# Patient Record
Sex: Female | Born: 1945
Health system: Southern US, Community
[De-identification: ages and names within clinical notes are randomized; demographics above are authoritative.]

## PROBLEM LIST (undated history)

## (undated) DIAGNOSIS — I4891 Unspecified atrial fibrillation: Secondary | ICD-10-CM

## (undated) DIAGNOSIS — I10 Essential (primary) hypertension: Secondary | ICD-10-CM

## (undated) DIAGNOSIS — E0500 Thyrotoxicosis with diffuse goiter without thyrotoxic crisis or storm: Secondary | ICD-10-CM

## (undated) DIAGNOSIS — E0590 Thyrotoxicosis, unspecified without thyrotoxic crisis or storm: Secondary | ICD-10-CM

## (undated) DIAGNOSIS — I509 Heart failure, unspecified: Secondary | ICD-10-CM

## (undated) HISTORY — PX: BREAST SURGERY: SHX581

## (undated) HISTORY — DX: Heart failure, unspecified: I50.9

## (undated) HISTORY — DX: Unspecified atrial fibrillation: I48.91

---

## 1998-11-20 ENCOUNTER — Emergency Department (HOSPITAL_COMMUNITY): Admission: EM | Admit: 1998-11-20 | Discharge: 1998-11-20 | Payer: Self-pay | Admitting: Emergency Medicine

## 1999-01-10 ENCOUNTER — Encounter: Admission: RE | Admit: 1999-01-10 | Discharge: 1999-01-10 | Payer: Self-pay | Admitting: Family Medicine

## 1999-01-10 ENCOUNTER — Encounter: Payer: Self-pay | Admitting: Family Medicine

## 1999-01-20 ENCOUNTER — Other Ambulatory Visit: Admission: RE | Admit: 1999-01-20 | Discharge: 1999-01-20 | Payer: Self-pay | Admitting: Family Medicine

## 1999-04-01 ENCOUNTER — Ambulatory Visit (HOSPITAL_COMMUNITY): Admission: RE | Admit: 1999-04-01 | Discharge: 1999-04-01 | Payer: Self-pay | Admitting: Family Medicine

## 1999-05-05 ENCOUNTER — Ambulatory Visit (HOSPITAL_BASED_OUTPATIENT_CLINIC_OR_DEPARTMENT_OTHER): Admission: RE | Admit: 1999-05-05 | Discharge: 1999-05-05 | Payer: Self-pay | Admitting: Plastic Surgery

## 2000-02-07 ENCOUNTER — Encounter: Admission: RE | Admit: 2000-02-07 | Discharge: 2000-05-07 | Payer: Self-pay | Admitting: Family Medicine

## 2000-11-23 ENCOUNTER — Ambulatory Visit (HOSPITAL_COMMUNITY): Admission: RE | Admit: 2000-11-23 | Discharge: 2000-11-23 | Payer: Self-pay | Admitting: Neurosurgery

## 2000-11-23 ENCOUNTER — Encounter: Payer: Self-pay | Admitting: Neurosurgery

## 2000-12-13 ENCOUNTER — Encounter: Payer: Self-pay | Admitting: Neurosurgery

## 2000-12-17 ENCOUNTER — Inpatient Hospital Stay (HOSPITAL_COMMUNITY): Admission: RE | Admit: 2000-12-17 | Discharge: 2000-12-18 | Payer: Self-pay | Admitting: Neurosurgery

## 2000-12-17 ENCOUNTER — Encounter: Payer: Self-pay | Admitting: Neurosurgery

## 2001-01-10 ENCOUNTER — Other Ambulatory Visit: Admission: RE | Admit: 2001-01-10 | Discharge: 2001-01-10 | Payer: Self-pay | Admitting: Family Medicine

## 2001-11-14 ENCOUNTER — Encounter: Payer: Self-pay | Admitting: Emergency Medicine

## 2001-11-14 ENCOUNTER — Emergency Department (HOSPITAL_COMMUNITY): Admission: EM | Admit: 2001-11-14 | Discharge: 2001-11-14 | Payer: Self-pay | Admitting: Emergency Medicine

## 2003-01-15 ENCOUNTER — Encounter: Admission: RE | Admit: 2003-01-15 | Discharge: 2003-01-15 | Payer: Self-pay | Admitting: Family Medicine

## 2003-08-27 ENCOUNTER — Other Ambulatory Visit: Admission: RE | Admit: 2003-08-27 | Discharge: 2003-08-27 | Payer: Self-pay | Admitting: Family Medicine

## 2003-10-09 ENCOUNTER — Encounter: Admission: RE | Admit: 2003-10-09 | Discharge: 2003-10-09 | Payer: Self-pay | Admitting: Family Medicine

## 2003-12-14 ENCOUNTER — Encounter: Admission: RE | Admit: 2003-12-14 | Discharge: 2003-12-14 | Payer: Self-pay | Admitting: Family Medicine

## 2004-01-08 ENCOUNTER — Ambulatory Visit (HOSPITAL_COMMUNITY): Admission: RE | Admit: 2004-01-08 | Discharge: 2004-01-08 | Payer: Self-pay | Admitting: Gastroenterology

## 2004-01-08 ENCOUNTER — Encounter (INDEPENDENT_AMBULATORY_CARE_PROVIDER_SITE_OTHER): Payer: Self-pay | Admitting: *Deleted

## 2005-08-03 ENCOUNTER — Other Ambulatory Visit: Admission: RE | Admit: 2005-08-03 | Discharge: 2005-08-03 | Payer: Self-pay | Admitting: Family Medicine

## 2007-02-28 ENCOUNTER — Other Ambulatory Visit: Admission: RE | Admit: 2007-02-28 | Discharge: 2007-02-28 | Payer: Self-pay | Admitting: Family Medicine

## 2008-11-27 ENCOUNTER — Emergency Department (HOSPITAL_COMMUNITY): Admission: EM | Admit: 2008-11-27 | Discharge: 2008-11-27 | Payer: Self-pay | Admitting: Emergency Medicine

## 2010-01-12 ENCOUNTER — Other Ambulatory Visit
Admission: RE | Admit: 2010-01-12 | Discharge: 2010-01-12 | Payer: Self-pay | Source: Home / Self Care | Admitting: Family Medicine

## 2010-04-06 LAB — URINALYSIS, ROUTINE W REFLEX MICROSCOPIC
Bilirubin Urine: NEGATIVE
Glucose, UA: NEGATIVE mg/dL
Hgb urine dipstick: NEGATIVE
Ketones, ur: NEGATIVE mg/dL
Nitrite: NEGATIVE
Protein, ur: NEGATIVE mg/dL
Specific Gravity, Urine: 1.019 (ref 1.005–1.030)
Urobilinogen, UA: 0.2 mg/dL (ref 0.0–1.0)
pH: 5.5 (ref 5.0–8.0)

## 2010-04-06 LAB — GLUCOSE, CAPILLARY: Glucose-Capillary: 105 mg/dL — ABNORMAL HIGH (ref 70–99)

## 2010-05-03 ENCOUNTER — Ambulatory Visit
Admission: RE | Admit: 2010-05-03 | Discharge: 2010-05-03 | Disposition: A | Discharge status: Home / Self Care | Payer: BC Managed Care – PPO | Source: Ambulatory Visit | Attending: Family Medicine | Admitting: Family Medicine

## 2010-05-03 ENCOUNTER — Other Ambulatory Visit: Payer: Self-pay | Admitting: Family Medicine

## 2010-05-03 DIAGNOSIS — M1250 Traumatic arthropathy, unspecified site: Secondary | ICD-10-CM

## 2010-05-20 NOTE — Op Note (Signed)
NAME:  Donna Booker, Donna Booker              ACCOUNT NO.:  [id number]   MEDICAL RECORD NO.:  [id number]          PATIENT TYPE:  AMB   LOCATION:  ENDO                         FACILITY:  [facility]   PHYSICIAN:  Anselmo Rod, M.D.  DATE OF BIRTH:  Dec 18, 1945   DATE OF PROCEDURE:  01/08/2004  DATE OF DISCHARGE:                                 OPERATIVE REPORT   PROCEDURE:  Colonoscopy with cold biopsies x6.   ENDOSCOPIST:  Anselmo Rod, M.D.   INSTRUMENT USED:  Olympus video colonoscope.   INDICATIONS FOR PROCEDURE:  A 65 year old African-American female underwent  a screening colonoscopy to rule out colonic polyps, masses, etc.   PREPROCEDURE PREPARATION:  Informed consent was procured from the patient.  The patient fasted for eight hours prior to the procedure and prepped with a  bottle of magnesium citrate and a gallon of GoLYTELY the night prior to the  procedure.   PREPROCEDURE PHYSICAL:  The patient had stable vital signs. Neck supple.  Chest clear to auscultation. S1, S2 regular, no murmurs, rubs or gallops,  rhonchi or wheeze.  Abdomen soft with normal bowel sounds.   DESCRIPTION OF PROCEDURE:  The patient was placed in the left lateral  decubitus position and sedated with 60 mg of Demerol and 7.5 mg of Versed in  slow incremental doses.  Once the patient was adequately sedated and  maintained on low flow oxygen and continuous cardiac monitoring, the Olympus  video colonoscope was advanced from the rectum to the cecum.  Small internal  hemorrhoids were seen on retroflexion and six small sessile polyps were  biopsied from the rectosigmoid colon.  There were no other abnormalities  appreciated. There was no evidence of diverticulosis.  The patient tolerated  the procedure well without complications.   IMPRESSION:  1.  Small nonbleeding internal hemorrhoids.  2.  Six small sessile polyps biopsied from rectosigmoid colon.  3.  Normal appearing proximal left colon, transverse colon,  right colon and      cecum.   RECOMMENDATIONS:  1.  Await pathology results.  2.  Avoid nonsteroidals including aspirins for the next two weeks.  3.  Repeat colonoscopy depending on pathology results.  4.  Outpatient followup on a p.r.n. basis.      Jyot   JNM/MEDQ  D:  01/08/2004  T:  01/08/2004  Job:  [id number]   cc:   Renaye Rakers, M.D.  [phone] [address]  [place]  [place] [address]  Fax: [phone]

## 2011-05-25 ENCOUNTER — Other Ambulatory Visit: Payer: Self-pay | Admitting: Family Medicine

## 2011-05-25 ENCOUNTER — Other Ambulatory Visit (HOSPITAL_COMMUNITY)
Admission: RE | Admit: 2011-05-25 | Discharge: 2011-05-25 | Disposition: A | Discharge status: Home / Self Care | Payer: BC Managed Care – PPO | Source: Ambulatory Visit | Attending: Family Medicine | Admitting: Family Medicine

## 2011-05-25 DIAGNOSIS — Z01419 Encounter for gynecological examination (general) (routine) without abnormal findings: Secondary | ICD-10-CM | POA: Insufficient documentation

## 2011-07-09 ENCOUNTER — Emergency Department (HOSPITAL_COMMUNITY)
Admission: EM | Admit: 2011-07-09 | Discharge: 2011-07-09 | Disposition: A | Discharge status: Home / Self Care | Payer: BC Managed Care – PPO | Attending: Emergency Medicine | Admitting: Emergency Medicine

## 2011-07-09 ENCOUNTER — Encounter (HOSPITAL_COMMUNITY): Payer: Self-pay | Admitting: Emergency Medicine

## 2011-07-09 ENCOUNTER — Emergency Department (HOSPITAL_COMMUNITY): Payer: BC Managed Care – PPO

## 2011-07-09 DIAGNOSIS — Z7982 Long term (current) use of aspirin: Secondary | ICD-10-CM | POA: Insufficient documentation

## 2011-07-09 DIAGNOSIS — Z794 Long term (current) use of insulin: Secondary | ICD-10-CM | POA: Insufficient documentation

## 2011-07-09 DIAGNOSIS — S0003XA Contusion of scalp, initial encounter: Secondary | ICD-10-CM | POA: Insufficient documentation

## 2011-07-09 DIAGNOSIS — Z79899 Other long term (current) drug therapy: Secondary | ICD-10-CM | POA: Insufficient documentation

## 2011-07-09 DIAGNOSIS — I10 Essential (primary) hypertension: Secondary | ICD-10-CM | POA: Insufficient documentation

## 2011-07-09 DIAGNOSIS — E119 Type 2 diabetes mellitus without complications: Secondary | ICD-10-CM | POA: Insufficient documentation

## 2011-07-09 DIAGNOSIS — W19XXXA Unspecified fall, initial encounter: Secondary | ICD-10-CM | POA: Insufficient documentation

## 2011-07-09 HISTORY — DX: Essential (primary) hypertension: I10

## 2011-07-09 NOTE — ED Notes (Signed)
Patient states that she fell in the bathtub yesterday morning; patient denies syncopal episode.  Patient reports that she hit her head on the bathtub.  Patient currently reporting dizziness; reports pain on left side of head.

## 2011-07-09 NOTE — ED Notes (Signed)
Patient discharged with instructions and she verbalized an understanding. Patient is alert and oriented times 4 and complains of soreness in left side of head.

## 2011-07-09 NOTE — ED Notes (Signed)
Patient states she has some dizziness and blurred vision. Denies taking any blood thinners.

## 2011-07-09 NOTE — ED Notes (Signed)
Back from xray

## 2011-07-09 NOTE — ED Provider Notes (Signed)
History     CSN: [id number]  Arrival date & time 07/09/11  4098   First MD Initiated Contact with Patient 07/09/11 0703      Chief Complaint  Patient presents with  . Fall    (Consider location/radiation/quality/duration/timing/severity/associated sxs/prior treatment) Patient is a 66 y.o. female presenting with fall. The history is provided by the patient.  Fall   Patient here after suffering a fall yesterday where she struck the left side of her head and also her left ribs and left lower extremity. No loss of consciousness. Some dizziness since the incident. Denies any neck pain or upper or lower extremity paresthesias. No medications taken prior to arrival. Pain is worse with movement characterized as sharp. Denies any dyspnea. No abdominal pain. Past Medical History  Diagnosis Date  . Diabetes mellitus   . Hypertension     Past Surgical History  Procedure Date  . Breast surgery     History reviewed. No pertinent family history.  History  Substance Use Topics  . Smoking status: Never Smoker   . Smokeless tobacco: Not on file  . Alcohol Use: No    OB History    Grav Para Term Preterm Abortions TAB SAB Ect Mult Living                  Review of Systems  All other systems reviewed and are negative.    Allergies  Review of patient's allergies indicates no known allergies.  Home Medications   Current Outpatient Rx  Name Route Sig Dispense Refill  . ASPIRIN EC 81 MG PO TBEC Oral Take 81 mg by mouth daily.    . INSULIN GLARGINE 100 UNIT/ML [place] SOLN Subcutaneous Inject 14 Units into the skin at bedtime.    Marland Kitchen LIRAGLUTIDE 18 MG/3ML [place] SOLN Subcutaneous Inject 18 mg into the skin daily.    Marland Kitchen METFORMIN HCL 500 MG PO TABS Oral Take 500 mg by mouth 2 (two) times daily with a meal.    . OLMESARTAN-AMLODIPINE-HCTZ 40-10-25 MG PO TABS Oral Take 1 tablet by mouth daily.    Marland Kitchen SPIRONOLACTONE 25 MG PO TABS Oral Take 25 mg by mouth daily.      BP 142/77  Pulse 81  Temp  98.5 F (36.9 C) (Oral)  Resp 18  SpO2 98%  Physical Exam  Nursing note and vitals reviewed. Constitutional: She is oriented to person, place, and time. She appears well-developed and well-nourished.  Non-toxic appearance. No distress.  HENT:  Head: Normocephalic and atraumatic.    Eyes: Conjunctivae, EOM and lids are normal. Pupils are equal, round, and reactive to light.  Neck: Normal range of motion. Neck supple. No tracheal deviation present. No mass present.  Cardiovascular: Normal rate, regular rhythm and normal heart sounds.  Exam reveals no gallop.   No murmur heard. Pulmonary/Chest: Effort normal and breath sounds normal. No stridor. No respiratory distress. She has no decreased breath sounds. She has no wheezes. She has no rhonchi. She has no rales.    Abdominal: Soft. Normal appearance and bowel sounds are normal. She exhibits no distension. There is no tenderness. There is no rigidity, no rebound, no guarding and no CVA tenderness.  Musculoskeletal: Normal range of motion. She exhibits no edema and no tenderness.       Legs: Neurological: She is alert and oriented to person, place, and time. She has normal strength. No cranial nerve deficit or sensory deficit. GCS eye subscore is 4. GCS verbal subscore is 5. GCS motor  subscore is 6.  Skin: Skin is warm and dry. No abrasion and no rash noted.  Psychiatric: She has a normal mood and affect. Her speech is normal and behavior is normal.    ED Course  Procedures (including critical care time)  Labs Reviewed - No data to display No results found.   No diagnosis found.    MDM  X-rays are negative. She's due for discharge        Toy Baker, MD 07/09/11 [phone]

## 2011-07-09 NOTE — ED Notes (Signed)
Patient transported to CT and for an x-ray.

## 2012-05-04 ENCOUNTER — Encounter (HOSPITAL_COMMUNITY): Payer: Self-pay | Admitting: *Deleted

## 2012-05-04 ENCOUNTER — Emergency Department (HOSPITAL_COMMUNITY)
Admission: EM | Admit: 2012-05-04 | Discharge: 2012-05-04 | Disposition: A | Discharge status: Home / Self Care | Payer: BC Managed Care – PPO | Attending: Emergency Medicine | Admitting: Emergency Medicine

## 2012-05-04 ENCOUNTER — Emergency Department (HOSPITAL_COMMUNITY): Payer: BC Managed Care – PPO

## 2012-05-04 DIAGNOSIS — M549 Dorsalgia, unspecified: Secondary | ICD-10-CM

## 2012-05-04 DIAGNOSIS — Z794 Long term (current) use of insulin: Secondary | ICD-10-CM | POA: Insufficient documentation

## 2012-05-04 DIAGNOSIS — M545 Low back pain, unspecified: Secondary | ICD-10-CM | POA: Insufficient documentation

## 2012-05-04 DIAGNOSIS — R319 Hematuria, unspecified: Secondary | ICD-10-CM | POA: Insufficient documentation

## 2012-05-04 DIAGNOSIS — Z79899 Other long term (current) drug therapy: Secondary | ICD-10-CM | POA: Insufficient documentation

## 2012-05-04 DIAGNOSIS — Z7982 Long term (current) use of aspirin: Secondary | ICD-10-CM | POA: Insufficient documentation

## 2012-05-04 DIAGNOSIS — I10 Essential (primary) hypertension: Secondary | ICD-10-CM | POA: Insufficient documentation

## 2012-05-04 DIAGNOSIS — E119 Type 2 diabetes mellitus without complications: Secondary | ICD-10-CM | POA: Insufficient documentation

## 2012-05-04 LAB — CBC WITH DIFFERENTIAL/PLATELET
Basophils Relative: 0 % (ref 0–1)
Eosinophils Absolute: 0.3 10*3/uL (ref 0.0–0.7)
Eosinophils Relative: 3 % (ref 0–5)
HCT: 35 % — ABNORMAL LOW (ref 36.0–46.0)
Hemoglobin: 11.8 g/dL — ABNORMAL LOW (ref 12.0–15.0)
Lymphocytes Relative: 37 % (ref 12–46)
Lymphs Abs: 4 10*3/uL (ref 0.7–4.0)
MCH: 26.2 pg (ref 26.0–34.0)
MCHC: 33.7 g/dL (ref 30.0–36.0)
MCV: 77.6 fL — ABNORMAL LOW (ref 78.0–100.0)
Monocytes Absolute: 0.8 10*3/uL (ref 0.1–1.0)
Monocytes Relative: 7 % (ref 3–12)
Neutro Abs: 5.7 10*3/uL (ref 1.7–7.7)
Neutrophils Relative %: 53 % (ref 43–77)
RBC: 4.51 MIL/uL (ref 3.87–5.11)
RDW: 13.3 % (ref 11.5–15.5)
WBC: 10.8 10*3/uL — ABNORMAL HIGH (ref 4.0–10.5)

## 2012-05-04 LAB — BASIC METABOLIC PANEL
BUN: 24 mg/dL — ABNORMAL HIGH (ref 6–23)
Calcium: 9.8 mg/dL (ref 8.4–10.5)
Chloride: 98 mEq/L (ref 96–112)
Creatinine, Ser: 1.04 mg/dL (ref 0.50–1.10)
GFR calc Af Amer: 64 mL/min — ABNORMAL LOW (ref >90)
GFR calc non Af Amer: 56 mL/min — ABNORMAL LOW (ref >90)
Glucose, Bld: 242 mg/dL — ABNORMAL HIGH (ref 70–99)
Potassium: 4.2 mEq/L (ref 3.5–5.1)
Sodium: 137 mEq/L (ref 135–145)

## 2012-05-04 LAB — URINALYSIS, ROUTINE W REFLEX MICROSCOPIC
Bilirubin Urine: NEGATIVE
Glucose, UA: 100 mg/dL — AB
Ketones, ur: NEGATIVE mg/dL
Leukocytes, UA: NEGATIVE
Nitrite: NEGATIVE
Urobilinogen, UA: 0.2 mg/dL (ref 0.0–1.0)
pH: 6 (ref 5.0–8.0)

## 2012-05-04 LAB — URINE MICROSCOPIC-ADD ON

## 2012-05-04 MED ORDER — ONDANSETRON 4 MG PO TBDP
4.0000 mg | ORAL_TABLET | Freq: Once | ORAL | Status: AC
  Administered 2012-05-04: 4 mg via ORAL
  Filled 2012-05-04: qty 1

## 2012-05-04 MED ORDER — DIAZEPAM 5 MG PO TABS: 5.0000 mg | ORAL_TABLET | Freq: Four times a day (QID) | ORAL | Status: DC | PRN

## 2012-05-04 MED ORDER — MORPHINE SULFATE 4 MG/ML IJ SOLN
4.0000 mg | Freq: Once | INTRAMUSCULAR | Status: AC
  Administered 2012-05-04: 4 mg via INTRAMUSCULAR
  Filled 2012-05-04: qty 1

## 2012-05-04 NOTE — ED Notes (Signed)
Patient transported to CT 

## 2012-05-04 NOTE — ED Provider Notes (Signed)
History     CSN: [id number]  Arrival date & time 05/04/12  1027   First MD Initiated Contact with Patient 05/04/12 [phone]      Chief Complaint  Patient presents with  . Back Pain    (Consider location/radiation/quality/duration/timing/severity/associated sxs/prior treatment) HPI  Donna Booker is a 67 y.o. female with insulin-dependent diabetes and hypertension complaining of  worsening lower back pain over the last 4 days patient denies any unusual activity or strenuous activity, patient states that the low back pain is bilateral and radiates around to the groin on both sides it also radiates down the leg the left leg is worse than the right with a pins and needles paresthesia. Does not have history of lumbago. She states that standing makes a better but walking makes it worse. Patient took Aleve 2 days ago and it was helpful however she was informed by her doctor that she should not take Aleve so she hasn't taken anything recently. She denies fever, N/V, change in bowel or bladder habits, history of cancer, history of IV drug use.   Past Medical History  Diagnosis Date  . Diabetes mellitus   . Hypertension     Past Surgical History  Procedure Laterality Date  . Breast surgery      No family history on file.  History  Substance Use Topics  . Smoking status: Never Smoker   . Smokeless tobacco: Not on file  . Alcohol Use: No    OB History   Grav Para Term Preterm Abortions TAB SAB Ect Mult Living                  Review of Systems  Constitutional: Negative for fever.  HENT: Negative for neck pain.   Respiratory: Negative for shortness of breath.   Cardiovascular: Negative for chest pain.  Gastrointestinal: Negative for nausea, vomiting, abdominal pain and diarrhea.  Genitourinary: Negative for dysuria and difficulty urinating.  Musculoskeletal: Positive for back pain.  Neurological: Negative for weakness and numbness.  All other systems reviewed and are  negative.    Allergies  Review of patient's allergies indicates no known allergies.  Home Medications   Current Outpatient Rx  Name  Route  Sig  Dispense  Refill  . aspirin EC 81 MG tablet   Oral   Take 81 mg by mouth daily.         . insulin glargine (LANTUS SOLOSTAR) 100 UNIT/ML injection   Subcutaneous   Inject 14 Units into the skin at bedtime.         . Liraglutide (VICTOZA) 18 MG/3ML SOLN   Subcutaneous   Inject 18 mg into the skin daily.         . metFORMIN (GLUCOPHAGE) 500 MG tablet   Oral   Take 500 mg by mouth 2 (two) times daily with a meal.         . Olmesartan-Amlodipine-HCTZ (TRIBENZOR) 40-10-25 MG TABS   Oral   Take 1 tablet by mouth daily.         Marland Kitchen spironolactone (ALDACTONE) 25 MG tablet   Oral   Take 25 mg by mouth daily.           BP 125/78  Pulse 81  Temp(Src) 98.1 F (36.7 C) (Oral)  Resp 18  SpO2 100%  Physical Exam  Nursing note and vitals reviewed. Constitutional: She is oriented to person, place, and time. She appears well-developed and well-nourished. No distress.  HENT:  Head: Normocephalic.  Eyes: Conjunctivae  and EOM are normal. Pupils are equal, round, and reactive to light.  Neck: Normal range of motion. No JVD present.  Cardiovascular: Normal rate, regular rhythm and intact distal pulses.   Pulmonary/Chest: Effort normal and breath sounds normal. No stridor. No respiratory distress. She has no wheezes. She has no rales. She exhibits no tenderness.     Abdominal: Soft. Bowel sounds are normal. She exhibits no distension and no mass. There is no tenderness. There is no rebound and no guarding.  No TTP, no masses  Genitourinary:  No CVAT b/l  Musculoskeletal: Normal range of motion.  Strait leg raise positive bilaterally  No midline tenderness to palpation or step-offs of the lumbar region, no paraspinal muscular spasm and tenderness to palpation.  Neurological: She is alert and oriented to person, place, and  time.  Follows commands, Goal oriented speech, Strength is 5 out of 5x4 extremities, patient ambulates with a coordinated in nonantalgic gait. Sensation is grossly intact.   Psychiatric: She has a normal mood and affect.    ED Course  Procedures (including critical care time)  Labs Reviewed  URINALYSIS, ROUTINE W REFLEX MICROSCOPIC - Abnormal; Notable for the following:    Glucose, UA 100 (*)    Hgb urine dipstick MODERATE (*)    Protein, ur 100 (*)    All other components within normal limits  CBC WITH DIFFERENTIAL - Abnormal; Notable for the following:    WBC 10.8 (*)    Hemoglobin 11.8 (*)    HCT 35.0 (*)    MCV 77.6 (*)    All other components within normal limits  URINE MICROSCOPIC-ADD ON - Abnormal; Notable for the following:    Squamous Epithelial / LPF FEW (*)    All other components within normal limits  BASIC METABOLIC PANEL   Ct Abdomen Pelvis Wo Contrast  05/04/2012  *RADIOLOGY REPORT*  Clinical Data: Back pain, mid abdominal pain  CT ABDOMEN AND PELVIS WITHOUT CONTRAST  Technique:  Multidetector CT imaging of the abdomen and pelvis was performed following the standard protocol without intravenous contrast.  Comparison: None.  Findings: Sagittal images of the spine shows degenerative changes lumbar spine.  There is disc space flattening with vacuum disc phenomenon and mild anterior spurring at L1-L2 and L2-L3 level. Multilevel mild anterior spurring.  Lung bases are unremarkable.  Study is limited without IV contrast. Moderate colonic stool.  No calcified gallstones are noted within gallbladder.  The unenhanced liver shows no biliary ductal dilatation.  Unenhanced pancreas spleen is unremarkable. Nonspecific mild thickening of the adrenal glands without definite mass on this unenhanced scan.  Unenhanced kidneys shows no nephrolithiasis.  No hydronephrosis or hydroureter.  No calcified ureteral calculi are noted.  There is a low density lesion in the upper pole of the left kidney  posterior aspect measures 1.4 cm.  This may represent a cyst but cannot be characterized without IV contrast.  There might be a second cyst in the mid pole of the left kidney measures 1.1 cm.  No aortic aneurysm.  No small bowel obstruction.  No ascites or free air.  No adenopathy.  There is no pericecal inflammation. Normal appendix is partially visualized axial image 56.  Unenhanced uterus and adnexa is unremarkable.  The urinary bladder is unremarkable.  Bilateral distal ureter is unremarkable.  No destructive bony lesions are noted within pelvis.  IMPRESSION:  1.  No nephrolithiasis.  No hydronephrosis or hydroureter. 2.  No calcified ureteral calculi. 3.  Question left renal cysts. 4.  No pericecal inflammation.  Normal appendix partially visualized. 5.  Moderate colonic stool. 6.  Degenerative changes lumbar spine.   Original Report Authenticated By: Natasha Mead, M.D.    Dg Lumbar Spine Complete  05/04/2012  *RADIOLOGY REPORT*  Clinical Data: Low back pain  LUMBAR SPINE - COMPLETE 4+ VIEW  Comparison: 11/27/2008  Findings: No evidence for fracture.  No substantial subluxation. Loss of disc height is seen at L1-L2 and L2-L3.  Trace anterolisthesis of L4-5 noted.  There is lower lumbar facet degeneration bilaterally.  SI joints are unremarkable.  IMPRESSION: Stable exam.  No acute bony findings.   Original Report Authenticated By: Kennith Center, M.D.      1. Back pain   2. Hematuria             MDM   Donna Booker is a 67 y.o. female with bilateral lower back pain that radiates around to the groin and down the legs. Patient has no prior history of back pain. No urinary symptoms. Bloodwork, UA and plain film.  Blood work shows mild leukocytosis of 10.8. Otherwise unremarkable. Urinalysis is not consistent with infection however there is a moderate amount of urine. For this reason CT scan is ordered.  CT shows no acute abnormalities.  I will ask the patient to follow with her primary care  provider for followup of the hematuria.   Discussed case with attending who agrees with plan and stability to d/c to home.    Filed Vitals:   05/04/12 0651 05/04/12 0700 05/04/12 0800  BP: 125/78 139/67 119/100  Pulse: 81 74 73  Temp: 98.1 F (36.7 C)    TempSrc: Oral    Resp: 18    SpO2: 100% 100% 98%     VSS and patient is appropriate for and amenable to discharge at this time. Pt verbalized understanding and agrees with care plan. Outpatient follow-up and return precautions given.     New Prescriptions   DIAZEPAM (VALIUM) 5 MG TABLET    Take 1 tablet (5 mg total) by mouth every 6 (six) hours as needed (muscle spasms).          Wynetta Emery, PA-C 05/04/12 1043

## 2012-05-04 NOTE — ED Notes (Signed)
Worsening lower back pain, radiating to mid abd. Makes it difficult to stand.

## 2012-05-04 NOTE — ED Notes (Signed)
Patient transported to X-ray 

## 2012-05-05 NOTE — ED Provider Notes (Signed)
Medical screening examination/treatment/procedure(s) were performed by non-physician practitioner and as supervising physician I was immediately available for consultation/collaboration.    Chayson Charters R Kyleah Pensabene, MD 05/05/12 0735 

## 2012-10-04 ENCOUNTER — Ambulatory Visit: Payer: BC Managed Care – PPO

## 2012-10-04 ENCOUNTER — Ambulatory Visit (INDEPENDENT_AMBULATORY_CARE_PROVIDER_SITE_OTHER): Payer: BC Managed Care – PPO | Admitting: Family Medicine

## 2012-10-04 VITALS — BP 140/70 | HR 85 | Temp 97.8°F | Resp 16 | Ht 64.5 in | Wt 237.0 lb

## 2012-10-04 DIAGNOSIS — R079 Chest pain, unspecified: Secondary | ICD-10-CM

## 2012-10-04 DIAGNOSIS — R0781 Pleurodynia: Secondary | ICD-10-CM

## 2012-10-04 DIAGNOSIS — E119 Type 2 diabetes mellitus without complications: Secondary | ICD-10-CM

## 2012-10-04 DIAGNOSIS — R0789 Other chest pain: Secondary | ICD-10-CM

## 2012-10-04 DIAGNOSIS — I10 Essential (primary) hypertension: Secondary | ICD-10-CM | POA: Insufficient documentation

## 2012-10-04 MED ORDER — TRAMADOL HCL 50 MG PO TABS: 50.0000 mg | ORAL_TABLET | Freq: Three times a day (TID) | ORAL | Status: DC | PRN

## 2012-10-04 NOTE — Progress Notes (Signed)
[facility] [address], [place] [address] [phone]  Date:  10/04/2012   Name:  Donna Booker   DOB:  1945-01-21   MRN:  [id number]  PCP:  Geraldo Pitter, MD    Chief Complaint: Back Pain   History of Present Illness:  Donna Booker is a 67 y.o. very pleasant female patient who presents with the following:  History of DM and HTN.  She has a PCP elsewhere.   She has noted pain in her left trunk for about 10 days.   She was in a class last week and had to sit in an uncomfortable chair for several hours- this seemed to precipitate her sx.   She has pain when she moves quickly, or when she changes position.  She has not noted any cough or sneeze, so she is not sure if these maneuvers would worsen her pain.    No cough, no fever.  She saw her PCP on Monday and was given some medication for muscle spasm- ibuprofen/ famotidine samples and flexeril.  However these do not seem to be helping much  She did not have x-rays.    No nuasea or vomiting.   She has not noted any SOB.  No chest pain.    There are no active problems to display for this patient.   Past Medical History  Diagnosis Date  . Diabetes mellitus   . Hypertension     Past Surgical History  Procedure Laterality Date  . Breast surgery      History  Substance Use Topics  . Smoking status: Never Smoker   . Smokeless tobacco: Not on file  . Alcohol Use: No    No family history on file.  No Known Allergies  Medication list has been reviewed and updated.  Current Outpatient Prescriptions on File Prior to Visit  Medication Sig Dispense Refill  . aspirin EC 81 MG tablet Take 81 mg by mouth daily.      Marland Kitchen esomeprazole (NEXIUM) 40 MG capsule Take 40 mg by mouth daily before breakfast.      . insulin glargine (LANTUS SOLOSTAR) 100 UNIT/ML injection Inject 14 Units into the skin at bedtime.      . Liraglutide (VICTOZA) 18 MG/3ML SOLN Inject 18 mg into the skin daily.      .  metFORMIN (GLUCOPHAGE) 500 MG tablet Take 500 mg by mouth 2 (two) times daily with a meal.      . spironolactone (ALDACTONE) 25 MG tablet Take 25 mg by mouth daily.      . diazepam (VALIUM) 5 MG tablet Take 1 tablet (5 mg total) by mouth every 6 (six) hours as needed (muscle spasms).  15 tablet  0  . Olmesartan-Amlodipine-HCTZ (TRIBENZOR) 40-10-25 MG TABS Take 1 tablet by mouth daily.       No current facility-administered medications on file prior to visit.    Review of Systems:  As per HPI- otherwise negative.   Physical Examination: Filed Vitals:   10/04/12 1723  BP: 140/70  Pulse: 85  Temp: 97.8 F (36.6 C)  Resp: 16   Filed Vitals:   10/04/12 1723  Height: 5' 4.5" (1.638 m)  Weight: 237 lb (107.502 kg)   Body mass index is 40.07 kg/(m^2). Ideal Body Weight: Weight in (lb) to have BMI = 25: 147.6  GEN: WDWN, NAD, Non-toxic, A & O x 3, obese HEENT: Atraumatic, Normocephalic. Neck supple. No masses, No LAD.  Bilateral TM wnl,  oropharynx normal.  PEERL,EOMI.   Ears and Nose: No external deformity. CV: RRR, No M/G/R. No JVD. No thrill. No extra heart sounds. PULM: CTA B, no wheezes, crackles, rhonchi. No retractions. No resp. distress. No accessory muscle use. ABD: S, NT, ND. No rebound. No HSM. EXTR: No c/c/e NEURO Normal gait.  PSYCH: Normally interactive. Conversant. Not depressed or anxious appearing.  Calm demeanor.  Left trunk: I am able to reproduce tenderness by pressing on her left lateral ribs.  No lesions or rash.   Left shoulder and neck negative  [facility] reading (PRIMARY) by  Dr. Patsy Lager. Chest/ ribs: negative  EXAM: LEFT RIBS AND CHEST - 3+ VIEW  COMPARISON: 07/09/2011  FINDINGS: The lungs are clear. No edema, infiltrate, pneumothorax or pleural fluid is identified. The heart size is within normal limits. Oblique left rib films show no evidence of rib fracture or lesion.  IMPRESSION: Normal chest and left ribs.  EKG:  SR, no ST elelvation or  depression  Assessment and Plan: Rib pain on left side - Plan: DG Ribs Unilateral W/Chest Left, EKG 12-Lead, traMADol (ULTRAM) 50 MG tablet  Pain in left ribs.  Will d/c flexeril as not helpful.  Tramadol prn pain.  She will let me know if not better in the next few days.    Signed Abbe Amsterdam, MD

## 2012-10-04 NOTE — Patient Instructions (Addendum)
Use the tramadol as needed for pain. Let me know if you are not feeling better in the next few days- Sooner if worse.   Do not combine the tramadol and flexeril (cyclobenzabrine) as this could make you very sleepy.

## 2013-11-10 ENCOUNTER — Ambulatory Visit (INDEPENDENT_AMBULATORY_CARE_PROVIDER_SITE_OTHER): Payer: BC Managed Care – PPO | Admitting: Podiatry

## 2013-11-10 ENCOUNTER — Ambulatory Visit (INDEPENDENT_AMBULATORY_CARE_PROVIDER_SITE_OTHER): Payer: Medicare Other

## 2013-11-10 ENCOUNTER — Encounter: Payer: Self-pay | Admitting: Podiatry

## 2013-11-10 VITALS — BP 131/65 | HR 79 | Resp 16

## 2013-11-10 DIAGNOSIS — M722 Plantar fascial fibromatosis: Secondary | ICD-10-CM

## 2013-11-10 MED ORDER — TRIAMCINOLONE ACETONIDE 10 MG/ML IJ SUSP
10.0000 mg | Freq: Once | INTRAMUSCULAR | Status: AC
  Administered 2013-11-10: 10 mg

## 2013-11-10 NOTE — Patient Instructions (Signed)

## 2013-11-10 NOTE — Progress Notes (Signed)
   Subjective:    Patient ID: Donna Booker, female    DOB: [birth year], 68 y.o.   MRN: [id number]  HPI Comments: "I have pain in this heel"  Patient c/o sharp pain plantar heel right for about 1 week. AM pain and some days are constant. She has been taking Aleve and its helped.  Also, she has a blistered area lateral left heel that itches and burns.     Review of Systems  Allergic/Immunologic: Positive for environmental allergies.  All other systems reviewed and are negative.      Objective:   Physical Exam        Assessment & Plan:

## 2013-11-11 NOTE — Progress Notes (Signed)
Subjective:     Patient ID: Donna Booker, female   DOB: 05/14/1945, 68 y.o.   MRN: [id number]  HPIpatient has significant discomfort in the right plantar heel at the insertion of the tendon into the calcaneus that's been present for about one week   Review of Systems  All other systems reviewed and are negative.      Objective:   Physical Exam  Constitutional: She is oriented to person, place, and time.  Cardiovascular: Intact distal pulses.   Musculoskeletal: Normal range of motion.  Neurological: She is oriented to person, place, and time.  Skin: Skin is warm.  Nursing note and vitals reviewed.  neurovascular status intact with muscle strength adequate and range of motion subtalar and midtarsal joint within normal limits. Patient is noted to have exquisite discomfort right plantar heel at the insertion the tendon the calcaneus and is noted to be well oriented 3 and has good digital perfusion. Arch height is moderately depressed upon weightbearing evaluation     Assessment:     Plantar fasciitis of the right heel at the insertion of the tendon into the calcaneus    Plan:     H&P and x-ray reviewed and injected the right plantar fascia 3 mg Kenalog 5 mg Xylocaine and applied fascially brace with instructions on usage. Reappoint one week

## 2013-11-18 ENCOUNTER — Ambulatory Visit (INDEPENDENT_AMBULATORY_CARE_PROVIDER_SITE_OTHER): Payer: BC Managed Care – PPO | Admitting: Podiatry

## 2013-11-18 ENCOUNTER — Encounter: Payer: Self-pay | Admitting: Podiatry

## 2013-11-18 VITALS — BP 155/78 | HR 86 | Resp 16

## 2013-11-18 DIAGNOSIS — M722 Plantar fascial fibromatosis: Secondary | ICD-10-CM

## 2013-11-18 MED ORDER — TRIAMCINOLONE ACETONIDE 10 MG/ML IJ SUSP
10.0000 mg | Freq: Once | INTRAMUSCULAR | Status: AC
  Administered 2013-11-18: 10 mg

## 2013-11-18 NOTE — Progress Notes (Signed)
Subjective:     Patient ID: Donna Booker, female   DOB: 07/10/1945, 68 y.o.   MRN: [id number]  HPIpatient points to right heel stating that it has been hurting her but more on the outside and the inside and there is also a small lesion which could be part of her problem   Review of Systems     Objective:   Physical Exam Neurovascular status intact with painful right heel lateral and central band with the medial band feeling quite a bit better and small keratotic plantar lesion    Assessment:     Plantar fasciitis with possible lesion is also part of the pain she is experiencing    Plan:     Injected the plantar fascia from the lateral side 3 mg Kenalog 5 mg Xylocaine and debrided the plantar lesion and reappoint again in 3 weeks. Reappoint earlier if symptoms get worse

## 2013-12-10 ENCOUNTER — Ambulatory Visit (INDEPENDENT_AMBULATORY_CARE_PROVIDER_SITE_OTHER): Payer: BC Managed Care – PPO | Admitting: Podiatry

## 2013-12-10 ENCOUNTER — Encounter: Payer: Self-pay | Admitting: Podiatry

## 2013-12-10 VITALS — BP 118/69 | HR 82 | Resp 18

## 2013-12-10 DIAGNOSIS — M7661 Achilles tendinitis, right leg: Secondary | ICD-10-CM

## 2013-12-10 DIAGNOSIS — L539 Erythematous condition, unspecified: Secondary | ICD-10-CM

## 2013-12-10 DIAGNOSIS — M79673 Pain in unspecified foot: Secondary | ICD-10-CM

## 2013-12-10 MED ORDER — CEPHALEXIN 500 MG PO CAPS: 500.0000 mg | ORAL_CAPSULE | Freq: Three times a day (TID) | ORAL | Status: DC

## 2013-12-10 NOTE — Patient Instructions (Signed)
.infeciton  Achilles Tendinitis  with Rehab Achilles tendinitis is a disorder of the Achilles tendon. The Achilles tendon connects the large calf muscles (Gastrocnemius and Soleus) to the heel bone (calcaneus). This tendon is sometimes called the heel cord. It is important for pushing-off and standing on your toes and is important for walking, running, or jumping. Tendinitis is often caused by overuse and repetitive microtrauma. SYMPTOMS  Pain, tenderness, swelling, warmth, and redness may occur over the Achilles tendon even at rest.  Pain with pushing off, or flexing or extending the ankle.  Pain that is worsened after or during activity. CAUSES   Overuse sometimes seen with rapid increase in exercise programs or in sports requiring running and jumping.  Poor physical conditioning (strength and flexibility or endurance).  Running sports, especially training running down hills.  Inadequate warm-up before practice or play or failure to stretch before participation.  Injury to the tendon. PREVENTION   Warm up and stretch before practice or competition.  Allow time for adequate rest and recovery between practices and competition.  Keep up conditioning.  Keep up ankle and leg flexibility.  Improve or keep muscle strength and endurance.  Improve cardiovascular fitness.  Use proper technique.  Use proper equipment (shoes, skates).  To help prevent recurrence, taping, protective strapping, or an adhesive bandage may be recommended for several weeks after healing is complete. PROGNOSIS   Recovery may take weeks to several months to heal.  Longer recovery is expected if symptoms have been prolonged.  Recovery is usually quicker if the inflammation is due to a direct blow as compared with overuse or sudden strain. RELATED COMPLICATIONS   Healing time will be prolonged if the condition is not correctly treated. The injury must be given plenty of time to heal.  Symptoms can  reoccur if activity is resumed too soon.  Untreated, tendinitis may increase the risk of tendon rupture requiring additional time for recovery and possibly surgery. TREATMENT   The first treatment consists of rest anti-inflammatory medication, and ice to relieve the pain.  Stretching and strengthening exercises after resolution of pain will likely help reduce the risk of recurrence. Referral to a physical therapist or athletic trainer for further evaluation and treatment may be helpful.  A walking boot or cast may be recommended to rest the Achilles tendon. This can help break the cycle of inflammation and microtrauma.  Arch supports (orthotics) may be prescribed or recommended by your caregiver as an adjunct to therapy and rest.  Surgery to remove the inflamed tendon lining or degenerated tendon tissue is rarely necessary and has shown less than predictable results. MEDICATION   Nonsteroidal anti-inflammatory medications, such as aspirin and ibuprofen, may be used for pain and inflammation relief. Do not take within 7 days before surgery. Take these as directed by your caregiver. Contact your caregiver immediately if any bleeding, stomach upset, or signs of allergic reaction occur. Other minor pain relievers, such as acetaminophen, may also be used.  Pain relievers may be prescribed as necessary by your caregiver. Do not take prescription pain medication for longer than 4 to 7 days. Use only as directed and only as much as you need.  Cortisone injections are rarely indicated. Cortisone injections may weaken tendons and predispose to rupture. It is better to give the condition more time to heal than to use them. HEAT AND COLD  Cold is used to relieve pain and reduce inflammation for acute and chronic Achilles tendinitis. Cold should be applied for 10 to  15 minutes every 2 to 3 hours for inflammation and pain and immediately after any activity that aggravates your symptoms. Use ice packs or an  ice massage.  Heat may be used before performing stretching and strengthening activities prescribed by your caregiver. Use a heat pack or a warm soak. SEEK MEDICAL CARE IF:  Symptoms get worse or do not improve in 2 weeks despite treatment.  New, unexplained symptoms develop. Drugs used in treatment may produce side effects. EXERCISES RANGE OF MOTION (ROM) AND STRETCHING EXERCISES - Achilles Tendinitis  These exercises may help you when beginning to rehabilitate your injury. Your symptoms may resolve with or without further involvement from your physician, physical therapist or athletic trainer. While completing these exercises, remember:   Restoring tissue flexibility helps normal motion to return to the joints. This allows healthier, less painful movement and activity.  An effective stretch should be held for at least 30 seconds.  A stretch should never be painful. You should only feel a gentle lengthening or release in the stretched tissue. STRETCH - Gastroc, Standing   Place hands on wall.  Extend right / left leg, keeping the front knee somewhat bent.  Slightly point your toes inward on your back foot.  Keeping your right / left heel on the floor and your knee straight, shift your weight toward the wall, not allowing your back to arch.  You should feel a gentle stretch in the right / left calf. Hold this position for __________ seconds. Repeat __________ times. Complete this stretch __________ times per day. STRETCH - Soleus, Standing   Place hands on wall.  Extend right / left leg, keeping the other knee somewhat bent.  Slightly point your toes inward on your back foot.  Keep your right / left heel on the floor, bend your back knee, and slightly shift your weight over the back leg so that you feel a gentle stretch deep in your back calf.  Hold this position for __________ seconds. Repeat __________ times. Complete this stretch __________ times per day. STRETCH -  Gastrocsoleus, Standing  Note: This exercise can place a lot of stress on your foot and ankle. Please complete this exercise only if specifically instructed by your caregiver.   Place the ball of your right / left foot on a step, keeping your other foot firmly on the same step.  Hold on to the wall or a rail for balance.  Slowly lift your other foot, allowing your body weight to press your heel down over the edge of the step.  You should feel a stretch in your right / left calf.  Hold this position for __________ seconds.  Repeat this exercise with a slight bend in your knee. Repeat __________ times. Complete this stretch __________ times per day.  STRENGTHENING EXERCISES - Achilles Tendinitis These exercises may help you when beginning to rehabilitate your injury. They may resolve your symptoms with or without further involvement from your physician, physical therapist or athletic trainer. While completing these exercises, remember:   Muscles can gain both the endurance and the strength needed for everyday activities through controlled exercises.  Complete these exercises as instructed by your physician, physical therapist or athletic trainer. Progress the resistance and repetitions only as guided.  You may experience muscle soreness or fatigue, but the pain or discomfort you are trying to eliminate should never worsen during these exercises. If this pain does worsen, stop and make certain you are following the directions exactly. If the pain is still  present after adjustments, discontinue the exercise until you can discuss the trouble with your clinician. STRENGTH - Plantar-flexors   Sit with your right / left leg extended. Holding onto both ends of a rubber exercise band/tubing, loop it around the ball of your foot. Keep a slight tension in the band.  Slowly push your toes away from you, pointing them downward.  Hold this position for __________ seconds. Return slowly, controlling the  tension in the band/tubing. Repeat __________ times. Complete this exercise __________ times per day.  STRENGTH - Plantar-flexors   Stand with your feet shoulder width apart. Steady yourself with a wall or table using as little support as needed.  Keeping your weight evenly spread over the width of your feet, rise up on your toes.*  Hold this position for __________ seconds. Repeat __________ times. Complete this exercise __________ times per day.  *If this is too easy, shift your weight toward your right / left leg until you feel challenged. Ultimately, you may be asked to do this exercise with your right / left foot only. STRENGTH - Plantar-flexors, Eccentric  Note: This exercise can place a lot of stress on your foot and ankle. Please complete this exercise only if specifically instructed by your caregiver.   Place the balls of your feet on a step. With your hands, use only enough support from a wall or rail to keep your balance.  Keep your knees straight and rise up on your toes.  Slowly shift your weight entirely to your right / left toes and pick up your opposite foot. Gently and with controlled movement, lower your weight through your right / left foot so that your heel drops below the level of the step. You will feel a slight stretch in the back of your calf at the end position.  Use the healthy leg to help rise up onto the balls of both feet, then lower weight only on the right / left leg again. Build up to 15 repetitions. Then progress to 3 consecutive sets of 15 repetitions.*  After completing the above exercise, complete the same exercise with a slight knee bend (about 30 degrees). Again, build up to 15 repetitions. Then progress to 3 consecutive sets of 15 repetitions.* Perform this exercise __________ times per day.  *When you easily complete 3 sets of 15, your physician, physical therapist or athletic trainer may advise you to add resistance by wearing a backpack filled with  additional weight. STRENGTH - Plantar Flexors, Seated   Sit on a chair that allows your feet to rest flat on the ground. If necessary, sit at the edge of the chair.  Keeping your toes firmly on the ground, lift your right / left heel as far as you can without increasing any discomfort in your ankle. Repeat __________ times. Complete this exercise __________ times a day. *If instructed by your physician, physical therapist or athletic trainer, you may add ____________________ of resistance by placing a weighted object on your right / left knee. Document Released: 07/20/2004 Document Revised: 03/13/2011 Document Reviewed: 04/02/2008 [facility] Patient Information 2015 [place], [place]. This information is not intended to replace advice given to you by your health care provider. Make sure you discuss any questions you have with your health care provider.

## 2013-12-11 ENCOUNTER — Ambulatory Visit: Payer: Medicare Other | Admitting: Podiatry

## 2013-12-13 NOTE — Progress Notes (Signed)
Patient ID: Donna Booker, female   DOB: 1945/06/21, 68 y.o.   MRN: [id number]  Subjective: 68 year old female returns the office they for follow-up evaluation of right heel pain. She states that the area is continued to be sore. She states that she has pain in the area particularly after rest or in the mornings. She denies any recent injury or trauma to the area. She states of the injection helped some however did not last for more than a day or so. The patient also has secondary concerns over lesion overlying the outside aspect of her left foot. She states that she has had an area similar to this on the right foot for which her primary care physician gave an antibiotic for in the area cleared. She says the lesion on the left foot has been red and somewhat painful. She denies any drainage or open lesion associated with the redness. There is been no streaking. She denies any systemic complaints as fevers, chills, nausea, vomiting. No other complaints at this time and no other acute changes since last appointment.  Objective: AAO 3, NAD DP/PT pulses palpable bilaterally, CRT less than 3 seconds Protective sensation intact with Simms Weinstein monofilament, vibratory sensation intact, Achilles tendon reflex intact. On the right heel there is a nurse palpation overlying the posterior aspect of the calcaneus at the insertion of the Achilles tendon. There is no pain along the course of the Achilles tendon/mid substance. Thompson test was performed me Achilles tendon is intact. There is no overlying edema, erythema, increase in warmth to the area. There is no tenderness to palpation overlying the plantar medial tubercle of the calcaneus at the insertion of the plantar fascia. There is no pain along the course of the plantar fascia within the arch of the foot. There is no pain with lateral compression of the calcaneus or pain with vibratory sensation. MMT 5/5, ROM WNL On the left lateral aspect of the midfoot  there is a small annular area of erythema with a central aspect of hyperkeratotic tissue. Upon debridement of the hyperkeratotic tissue no underlying ulceration no drainage identified. There is no areas of fluctuance or crepitus. No ascending cellulitis. There is no malodor. No drainage/purulence. There is mild tenderness to palpation directly over this area. There is no specific areas of pinpoint bony tenderness or pain with vibratory sensation.  No open lesions identified. No pain with calf compression, swelling, warmth, erythema.  Assessment: 68 year old female with right heel pain, likely insertional Achilles tendinitis; left foot localized erythema  Plan: -Treatment options were discussed including alternatives, risks, complications. -For the posterior heel pain at this time recommended stretching exercises which were discussed with the patient. Also continue ice the effected area. Discussed shoe gear modifications and possible orthotics to help control her foot type. Also discussed a possible slight heel lift to help take some pressure off the Achilles tendon in the acute setting. Discussed with the patient not to wear the heel lift long-term. -For the localized erythema on the lateral of the left foot patient states of this is a similar area that she had in the right foot which responded to antibiotics. Hyperkeratotic tissue in the central aspect was sharply debrided without any complications. There is no underlying ulceration, drainage. At this time there is not appear to be any ascending cellulitis, open lesion, fluctuance/crepitus. However due to the pain in the localized erythema will start Keflex for possible infection. Continue to monitor area for any signs or symptoms of worsening infection and directed  to call the office immediately should any occur or go to the emergency room. -Follow-up in 3 weeks or sooner if any problems are to arise. In the meantime, call the office with any questions,  concerns, change in symptoms.

## 2013-12-31 ENCOUNTER — Ambulatory Visit (INDEPENDENT_AMBULATORY_CARE_PROVIDER_SITE_OTHER): Payer: Medicare Other | Admitting: Podiatry

## 2013-12-31 ENCOUNTER — Encounter: Payer: Self-pay | Admitting: Podiatry

## 2013-12-31 VITALS — BP 89/50 | HR 101 | Resp 18

## 2013-12-31 DIAGNOSIS — M7661 Achilles tendinitis, right leg: Secondary | ICD-10-CM

## 2014-01-02 NOTE — Progress Notes (Signed)
Patient ID: Donna Booker, female   DOB: 03/03/1945, 69 y.o.   MRN: [id number]  Subjective: 69 year old female presents the office they for follow-up evaluation of right posterior heel pain insertional Achilles tendinitis. The patient states that since last appointment her symptoms have decreased although she continues to have some mild discomfort overlying the back of the right heel. She states that she has been unable to get the compound cream medication. She does state that she was given a gel ointment from a friend which she applies to the area which helps alleviate her symptoms.   She also states that the area on the left foot has improved and she denies any increase in symptoms. He denies any increase in erythema or drainage, ascending cellulitis. There is no pain associated with the lesion at this time. No other complaints at this time.  Objective: AAO x3, NAD DP/PT pulses palpable bilaterally, CRT less than 3 seconds Protective sensation intact with Simms Weinstein monofilament, vibratory sensation intact, Achilles tendon reflex intact Mild discomfort along the posterior aspect of the right heel overlying a prominent retrocalcaneal exostosis and at the insertion of the Achilles tendon, although decreased compared to prior. There is no overlying edema, erythema, increase in warmth. There is no pain on the mid substance of the Achilles tendon and the tendon appears to be intact. There is no pain with lateral compression of the calcaneus or pain with vibratory sensation. On the left foot on the lateral aspect of the midfoot there is a small annular area of resolved erythema. There is no associated open lesions. There is no areas of fluctuance or crepitus. No ascending saline this. No tenderness to palpation overlying this area. No open lesions or pre-ulcerative lesions.  No pain with calf compression, swelling, warmth, erythema.  Assessment: 69 year old female with right posterior heel pain,  insertional Achilles tendinitis; resolved erythema left lateral foot  Plan: -Treatment options were discussed the patient including alternatives, risks, complications. -For the right foot posterior heel pain recommended to continue stretching exercises as well as icing to the area. Discussed a night splint with the patient for which she states that she will purchase one over-the-counter. She states that she will follow-up with a compound cream pharmacy today. Discussed with her that if she is unable to get this cream to call me with the ointment she is applying currently which was given to her by her friend. Sounds like she is applying full tear and gel. If this is the case, recommeded her to call the office and I can prescribe this for her as well. Discussed shoegear changes, and possible orthotics. She states she has found some shoes which help alleviate her symptoms.  -Continue to monitor the area on the left foot. Currently, the symptoms have resolved. -Follow-up as needed. Recommend to the patient if the symptoms have not resolved within 4 weeks to call the office for follow-up appointment or to call sooner if there are any change or worsening of symptoms. In the meantime, encouraged the patient to call with any questions, concerns, change in symptoms.

## 2014-08-24 ENCOUNTER — Ambulatory Visit: Payer: Self-pay | Admitting: Internal Medicine

## 2014-09-01 ENCOUNTER — Other Ambulatory Visit (INDEPENDENT_AMBULATORY_CARE_PROVIDER_SITE_OTHER): Payer: BLUE CROSS/BLUE SHIELD

## 2014-09-01 ENCOUNTER — Ambulatory Visit (INDEPENDENT_AMBULATORY_CARE_PROVIDER_SITE_OTHER): Payer: BLUE CROSS/BLUE SHIELD | Admitting: Internal Medicine

## 2014-09-01 ENCOUNTER — Encounter: Payer: Self-pay | Admitting: Internal Medicine

## 2014-09-01 VITALS — BP 138/78 | HR 92 | Temp 98.7°F | Resp 14 | Ht 64.0 in | Wt 242.0 lb

## 2014-09-01 DIAGNOSIS — E1165 Type 2 diabetes mellitus with hyperglycemia: Secondary | ICD-10-CM

## 2014-09-01 DIAGNOSIS — E785 Hyperlipidemia, unspecified: Secondary | ICD-10-CM | POA: Diagnosis not present

## 2014-09-01 DIAGNOSIS — I10 Essential (primary) hypertension: Secondary | ICD-10-CM | POA: Diagnosis not present

## 2014-09-01 DIAGNOSIS — IMO0002 Reserved for concepts with insufficient information to code with codable children: Secondary | ICD-10-CM

## 2014-09-01 LAB — COMPREHENSIVE METABOLIC PANEL
ALT: 14 U/L (ref 0–35)
AST: 15 U/L (ref 0–37)
Albumin: 4.2 g/dL (ref 3.5–5.2)
Alkaline Phosphatase: 70 U/L (ref 39–117)
BILIRUBIN TOTAL: 0.2 mg/dL (ref 0.2–1.2)
BUN: 26 mg/dL — ABNORMAL HIGH (ref 6–23)
CHLORIDE: 104 meq/L (ref 96–112)
CO2: 23 meq/L (ref 19–32)
CREATININE: 1.16 mg/dL (ref 0.40–1.20)
Calcium: 9.6 mg/dL (ref 8.4–10.5)
GFR: 59.9 mL/min — ABNORMAL LOW (ref >60.00)
GLUCOSE: 193 mg/dL — AB (ref 70–99)
Potassium: 4.6 mEq/L (ref 3.5–5.1)
Sodium: 136 mEq/L (ref 135–145)
Total Protein: 8.1 g/dL (ref 6.0–8.3)

## 2014-09-01 LAB — LIPID PANEL
CHOL/HDL RATIO: 4
Cholesterol: 124 mg/dL (ref 0–200)
HDL: 35.3 mg/dL — AB (ref >39.00)
LDL CALC: 55 mg/dL (ref 0–99)
NONHDL: 88.8
TRIGLYCERIDES: 171 mg/dL — AB (ref 0.0–149.0)
VLDL: 34.2 mg/dL (ref 0.0–40.0)

## 2014-09-01 LAB — HEMOGLOBIN A1C: HEMOGLOBIN A1C: 9.1 % — AB (ref 4.6–6.5)

## 2014-09-01 MED ORDER — OLMESARTAN-AMLODIPINE-HCTZ 40-10-25 MG PO TABS: 1.0000 | ORAL_TABLET | Freq: Every day | ORAL | Status: DC

## 2014-09-01 MED ORDER — PITAVASTATIN CALCIUM 1 MG PO TABS: 1.0000 mg | ORAL_TABLET | Freq: Every day | ORAL | Status: DC

## 2014-09-01 MED ORDER — METFORMIN HCL 1000 MG PO TABS: 1000.0000 mg | ORAL_TABLET | Freq: Two times a day (BID) | ORAL | Status: DC

## 2014-09-01 NOTE — Patient Instructions (Signed)
We have sent in the tribenzor and the new cholesterol medicine.  Stop taking the crestor (rosuvastatin) and give it 2 weeks to get out of your system. I have sent in the new one called pitavastatin which is 1 pill daily.   We have sent in a refill of the metformin and have increased the dose to 1000 mg twice a day (the new pill is stronger so you are still taking 1 pill twice a day).   We will check your labs today and call you back with the results. We will also get your old records.   We are going to have you see a diabetes expert as well as a nutritionist to go back over the diabetes foods etc.   Diabetes and Standards of Medical Care Diabetes is complicated. You may find that your diabetes team includes a dietitian, nurse, diabetes educator, eye doctor, and more. To help everyone know what is going on and to help you get the care you deserve, the following schedule of care was developed to help keep you on track. Below are the tests, exams, vaccines, medicines, education, and plans you will need. HbA1c test This test shows how well you have controlled your glucose over the past 2-3 months. It is used to see if your diabetes management plan needs to be adjusted.   It is performed at least 2 times a year if you are meeting treatment goals.  It is performed 4 times a year if therapy has changed or if you are not meeting treatment goals. Blood pressure test  This test is performed at every routine medical visit. The goal is less than 140/90 mm Hg for most people, but 130/80 mm Hg in some cases. Ask your health care provider about your goal. Dental exam  Follow up with the dentist regularly. Eye exam  If you are diagnosed with type 1 diabetes as a child, get an exam upon reaching the age of 23 years or older and have had diabetes for 3-5 years. Yearly eye exams are recommended after that initial eye exam.  If you are diagnosed with type 1 diabetes as an adult, get an exam within 5 years of  diagnosis and then yearly.  If you are diagnosed with type 2 diabetes, get an exam as soon as possible after the diagnosis and then yearly. Foot care exam  Visual foot exams are performed at every routine medical visit. The exams check for cuts, injuries, or other problems with the feet.  A comprehensive foot exam should be done yearly. This includes visual inspection as well as assessing foot pulses and testing for loss of sensation.  Check your feet nightly for cuts, injuries, or other problems with your feet. Tell your health care provider if anything is not healing. Kidney function test (urine microalbumin)  This test is performed once a year.  Type 1 diabetes: The first test is performed 5 years after diagnosis.  Type 2 diabetes: The first test is performed at the time of diagnosis.  A serum creatinine and estimated glomerular filtration rate (eGFR) test is done once a year to assess the level of chronic kidney disease (CKD), if present. Lipid profile (cholesterol, HDL, LDL, triglycerides)  Performed every 5 years for most people.  The goal for LDL is less than 100 mg/dL. If you are at high risk, the goal is less than 70 mg/dL.  The goal for HDL is 40 mg/dL-50 mg/dL for men and 50 mg/dL-60 mg/dL for women. An HDL  cholesterol of 60 mg/dL or higher gives some protection against heart disease.  The goal for triglycerides is less than 150 mg/dL. Influenza vaccine, pneumococcal vaccine, and hepatitis B vaccine  The influenza vaccine is recommended yearly.  It is recommended that people with diabetes who are over 5 years old get the pneumonia vaccine. In some cases, two separate shots may be given. Ask your health care provider if your pneumonia vaccination is up to date.  The hepatitis B vaccine is also recommended for adults with diabetes. Diabetes self-management education  Education is recommended at diagnosis and ongoing as needed. Treatment plan  Your treatment plan is  reviewed at every medical visit. Document Released: 10/16/2008 Document Revised: 05/05/2013 Document Reviewed: 05/21/2012 [facility] Patient Information 2015 [place], [place]. This information is not intended to replace advice given to you by your health care provider. Make sure you discuss any questions you have with your health care provider.

## 2014-09-01 NOTE — Progress Notes (Signed)
Pre visit review using our clinic review tool, if applicable. No additional management support is needed unless otherwise documented below in the visit note. 

## 2014-09-02 ENCOUNTER — Encounter: Payer: Self-pay | Admitting: Internal Medicine

## 2014-09-02 DIAGNOSIS — E1169 Type 2 diabetes mellitus with other specified complication: Secondary | ICD-10-CM | POA: Insufficient documentation

## 2014-09-02 DIAGNOSIS — E785 Hyperlipidemia, unspecified: Secondary | ICD-10-CM

## 2014-09-02 NOTE — Assessment & Plan Note (Signed)
Controlled on 4 drug regimen of olmesartan/amlodipine/hctz and spironolactone. Unclear to me why she was started on spironolactone and that would be the one to be adjusted first. Perhaps she had low potassium in the past. Checking records. Checking CMP.

## 2014-09-02 NOTE — Assessment & Plan Note (Signed)
Talked to her about the fact that her weight is the likely cause of her diabetes, hypertension, hyperlipidemia. Talked to her about the need to work on diet for her diabetes and exercise for her overall stamina and endurance. Going to diabetes education.

## 2014-09-02 NOTE — Progress Notes (Signed)
   Subjective:    Patient ID: Donna Booker, female    DOB: [birth year], 69 y.o.   MRN: [id number]  HPI The patient is a 69 YO female coming in new for diabetes. She does not feel her previous provider was doing a good job. She has had it for some years and has been on and off insulins in the past. Her HgA1c has not been well controlled recently and she was recently started on an insulin pump. She does not think it is helping to bring down her sugars. She does have some burning in her feet. Has not been to diabetic education in a long time. Not sure that she really knows what to eat. Gets her eyes checked every year and thinks they told her no signs of diabetes in the eyes. Otherwise she feels pretty healthy.   PMH, [facility], social history reviewed and updated.   Review of Systems  Constitutional: Negative for fever, activity change, appetite change, fatigue and unexpected weight change.  HENT: Negative.   Eyes: Negative.   Respiratory: Negative for cough, chest tightness, shortness of breath and wheezing.   Cardiovascular: Negative for chest pain, palpitations and leg swelling.  Gastrointestinal: Negative for nausea, abdominal pain, diarrhea, constipation and abdominal distention.  Musculoskeletal: Negative.   Skin: Negative.   Neurological: Negative for dizziness, weakness, light-headedness and headaches.  Psychiatric/Behavioral: Negative.       Objective:   Physical Exam  Constitutional: She is oriented to person, place, and time. She appears well-developed and well-nourished.  Overweight  HENT:  Head: Normocephalic and atraumatic.  Eyes: EOM are normal.  Neck: Normal range of motion.  Cardiovascular: Normal rate and regular rhythm.   Pulmonary/Chest: Effort normal and breath sounds normal. No respiratory distress. She has no wheezes. She has no rales.  Abdominal: Soft. Bowel sounds are normal. She exhibits no distension. There is no tenderness. There is no rebound.  Musculoskeletal:  She exhibits no edema.  Neurological: She is alert and oriented to person, place, and time. Coordination normal.  Skin: Skin is warm and dry.  See foot exam  Psychiatric: She has a normal mood and affect.   Filed Vitals:   09/01/14 1538  BP: 138/78  Pulse: 92  Temp: 98.7 F (37.1 C)  TempSrc: Oral  Resp: 14  Height: 5\' 4"  (1.626 m)  Weight: 242 lb (109.77 kg)  SpO2: 98%      Assessment & Plan:

## 2014-09-02 NOTE — Assessment & Plan Note (Signed)
Refilled her metformin and let her know that I do not manage insulin pumps. She will be referred to endocrinology. Checking HgA1c, lipid panel, CMP today. Some mild burning but no known complication as of yet. Foot exam done. She is on ARB and not statin currently. Unknown last HgA1c (although she thinks it was either 8 or 12). Also will refer her back to diabetic education. Talked with her today about exercise as an essential part of her diabetes plan. Information given to her about standard of care in diabetes. She is not sure what medicines she has tried in the past but was on victoza and liked how it worked. Recently was put on trulicity and thought it was not working so is not taking anymore. Getting records from her previous provider.

## 2014-09-02 NOTE — Assessment & Plan Note (Signed)
Patient was started on crestor and did not tolerate. Have advised her to stop for 2 weeks then try to start pitavastatin 1 mg daily. Checking lipid panel today.

## 2014-09-28 ENCOUNTER — Encounter: Payer: Self-pay | Admitting: Internal Medicine

## 2014-10-27 ENCOUNTER — Ambulatory Visit (INDEPENDENT_AMBULATORY_CARE_PROVIDER_SITE_OTHER): Payer: BLUE CROSS/BLUE SHIELD | Admitting: Internal Medicine

## 2014-10-27 ENCOUNTER — Encounter: Payer: Self-pay | Admitting: Internal Medicine

## 2014-10-27 VITALS — BP 124/68 | HR 88 | Temp 97.6°F | Resp 12 | Ht 64.0 in | Wt 244.6 lb

## 2014-10-27 DIAGNOSIS — E1165 Type 2 diabetes mellitus with hyperglycemia: Secondary | ICD-10-CM

## 2014-10-27 DIAGNOSIS — E1140 Type 2 diabetes mellitus with diabetic neuropathy, unspecified: Secondary | ICD-10-CM

## 2014-10-27 DIAGNOSIS — Z794 Long term (current) use of insulin: Secondary | ICD-10-CM

## 2014-10-27 DIAGNOSIS — IMO0002 Reserved for concepts with insufficient information to code with codable children: Secondary | ICD-10-CM

## 2014-10-27 MED ORDER — INSULIN DETEMIR 100 UNIT/ML FLEXPEN: 30.0000 [IU] | PEN_INJECTOR | Freq: Every day | SUBCUTANEOUS | Status: DC

## 2014-10-27 MED ORDER — LIRAGLUTIDE 18 MG/3ML ~~LOC~~ SOPN: 1.8000 mg | PEN_INJECTOR | Freq: Every day | SUBCUTANEOUS | Status: DC

## 2014-10-27 MED ORDER — METFORMIN HCL 1000 MG PO TABS: 1000.0000 mg | ORAL_TABLET | Freq: Every day | ORAL | Status: DC

## 2014-10-27 NOTE — Progress Notes (Signed)
Patient ID: Donna Booker, female   DOB: [birth year], 69 y.o.   MRN: [id number]  HPI: Donna Booker is a 69 y.o.-year-old female, referred by her PCP, Dr. Sharlet Salina, for management of DM2, dx in 1987, insulin-dependent since 2006, uncontrolled, with complications (PN, stage 2 CKD).  Last hemoglobin A1c was: Lab Results  Component Value Date   HGBA1C 9.1* 09/01/2014   Pt is on a regimen of: - Metformin 1000 mg in am - VGo 30 - initially with 1-2 meal clicks, now off these - started 2015 She was on Lantus 30, but taken off to start the [place]. She was on Victoza. She tried Bydureon (02/2013-10/2014) >> made her hungry, did not lower sugars. She also tried Trulicity 1.5 mg (82/9937-16/9678) >> did not lower her sugars.  Pt checked her sugars 2x a day - not in last 2 months - before this: - am: 147-160  - 2h after b'fast: n/c - before lunch: 98-120 - 2h after lunch: n/c - before dinner: n/c - 2h after dinner: n/c - bedtime: 120s - nighttime: n/c No lows. Lowest sugar was 97; she has hypoglycemia awareness at 70.  Highest sugar was 228  Glucometer: AccuChek  Pt's meals are: - Breakfast: coffee, special K cereals; sometimes bacon + egg, toast; pancakes - Lunch: sandwich; hamburger, salad - Dinner: - Snacks: 2: fresh fruit, dried fruit, smtms icecream  - + stage 2 CKD, last BUN/creatinine:  Lab Results  Component Value Date   BUN 26* 09/01/2014   CREATININE 1.16 09/01/2014  On Olmesartan. - last set of lipids: Lab Results  Component Value Date   CHOL 124 09/01/2014   HDL 35.30* 09/01/2014   LDLCALC 55 09/01/2014   TRIG 171.0* 09/01/2014   CHOLHDL 4 09/01/2014  On Pitavastatin. - last eye exam was in 01/2014. No DR.  - no numbness and tingling in her feet.  Pt has FH of DM in M, F, sister, brother, son.  ROS: Constitutional: + weight gain, no fatigue, no subjective hyperthermia/hypothermia, + nocturia sometimes, + poor sleep Eyes: no blurry vision, no  xerophthalmia ENT: no sore throat, no nodules palpated in throat, no dysphagia/odynophagia, no hoarseness Cardiovascular: no CP/SOB/palpitations/+ leg swelling Respiratory: no cough/SOB Gastrointestinal: no N/V/D/C Musculoskeletal: no muscle/joint aches Skin: no rashes Neurological: no tremors/numbness/tingling/dizziness Psychiatric: no depression/anxiety  Past Medical History  Diagnosis Date  . Diabetes mellitus   . Hypertension    Past Surgical History  Procedure Laterality Date  . Breast surgery     Social History   Social History  . Marital Status: Married    Spouse Name: N/A  . Number of Children: 1   Occupational History  . 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 Smoking status: Never Smoker   . Smokeless tobacco: Not on file  . Alcohol Use: No  . Drug Use: No   Current Outpatient Prescriptions on File Prior to Visit  Medication Sig Dispense Refill  . aspirin EC 81 MG tablet Take 81 mg by mouth daily.    Marland Kitchen esomeprazole (NEXIUM) 40 MG capsule Take 40 mg by mouth daily before breakfast.    . Insulin Disposable Pump (V-GO 30) KIT     . insulin lispro (HUMALOG) 100 UNIT/ML injection Inject into the skin 3 (three) times daily before meals.    . metFORMIN (GLUCOPHAGE) 1000 MG tablet Take 1 tablet (1,000 mg total) by mouth 2 (two) times daily with a meal. 180 tablet 3  . Olmesartan-Amlodipine-HCTZ (TRIBENZOR) 40-10-25 MG TABS  Take 1 tablet by mouth daily. 90 tablet 3  . Pitavastatin Calcium 1 MG TABS Take 1 tablet (1 mg total) by mouth daily. 90 tablet 3  . spironolactone (ALDACTONE) 25 MG tablet Take 25 mg by mouth daily.     No current facility-administered medications on file prior to visit.   No Known Allergies   FH - see HPI  PE: BP 124/68 mmHg  Pulse 88  Temp(Src) 97.6 F (36.4 C) (Oral)  Resp 12  Ht 5' 4"  (1.626 m)  Wt 244 lb 9.6 oz (110.95 kg)  BMI 41.96 kg/m2  SpO2 96% Wt Readings from Last 3 Encounters:  10/27/14 244 lb 9.6 oz (110.95  kg)  09/01/14 242 lb (109.77 kg)  10/04/12 237 lb (107.502 kg)   Constitutional: overweight, in NAD Eyes: PERRLA, EOMI, no exophthalmos ENT: moist mucous membranes, no thyromegaly, no cervical lymphadenopathy Cardiovascular: RRR, No MRG Respiratory: CTA B Gastrointestinal: abdomen soft, NT, ND, BS+ Musculoskeletal: no deformities, strength intact in all 4 Skin: moist, warm, no rashes Neurological: no tremor with outstretched hands, DTR normal in all 4  ASSESSMENT: 1. DM2, insulin-dependent, uncontrolled, with complications - PN - CKD stage 2  PLAN:  1. Patient with long-standing, uncontrolled diabetes, on oral antidiabetic regimen with half-max dose Metformin and VGo dosing of basal insulin (no mealtime insulin), which is insufficient. She had good results on Lantus and Victoza in the past >> I suggested she switches back to this (Levemir instead of Lantus - insurance preference). Given discount card for Victoza. I will also advise her to move Metformin at night as her sugars were higher in am. - I suggested to:  Patient Instructions  Please stop the VGo pump tonight. Tonight inject 20 units of Levemir and increase to 30 units tomorrow night.  Move Metformin 1000 mg with dinner.  Start Victoza 0.6 mg daily in am and increase by 0.6 mg every week until you get to 1.8 mg daily.   Please return in 1.5 months with your sugar log.   - Strongly advised her to start checking sugars at different times of the day - check 2 times a day, rotating checks - will get a ReliOn meter - given sugar log and advised how to fill it and to bring it at next appt  - given foot care handout and explained the principles  - given instructions for hypoglycemia management "15-15 rule"  - advised for yearly eye exams >> she is UTD - had flu shot today at work - Return to clinic in 1.5 mo with sugar log

## 2014-10-27 NOTE — Patient Instructions (Addendum)
Please stop the VGo pump tonight. Tonight inject 20 units of Levemir and increase to 30 units tomorrow night.  Move Metformin 1000 mg with dinner.  Start Victoza 0.6 mg daily in am and increase by 0.6 mg every week until you get to 1.8 mg daily.   Please return in 1.5 months with your sugar log.   PATIENT INSTRUCTIONS FOR TYPE 2 DIABETES:  **Please join MyChart!** - see attached instructions about how to join if you have not done so already.  DIET AND EXERCISE Diet and exercise is an important part of diabetic treatment.  We recommended aerobic exercise in the form of brisk walking (working between 40-60% of maximal aerobic capacity, similar to brisk walking) for 150 minutes per week (such as 30 minutes five days per week) along with 3 times per week performing 'resistance' training (using various gauge rubber tubes with handles) 5-10 exercises involving the major muscle groups (upper body, lower body and core) performing 10-15 repetitions (or near fatigue) each exercise. Start at half the above goal but build slowly to reach the above goals. If limited by weight, joint pain, or disability, we recommend daily walking in a swimming pool with water up to waist to reduce pressure from joints while allow for adequate exercise.    BLOOD GLUCOSES Monitoring your blood glucoses is important for continued management of your diabetes. Please check your blood glucoses 2-4 times a day: fasting, before meals and at bedtime (you can rotate these measurements - e.g. one day check before the 3 meals, the next day check before 2 of the meals and before bedtime, etc.).   HYPOGLYCEMIA (low blood sugar) Hypoglycemia is usually a reaction to not eating, exercising, or taking too much insulin/ other diabetes drugs.  Symptoms include tremors, sweating, hunger, confusion, headache, etc. Treat IMMEDIATELY with 15 grams of Carbs: . 4 glucose tablets .  cup regular juice/soda . 2 tablespoons raisins . 4 teaspoons  sugar . 1 tablespoon honey Recheck blood glucose in 15 mins and repeat above if still symptomatic/blood glucose <100.  RECOMMENDATIONS TO REDUCE YOUR RISK OF DIABETIC COMPLICATIONS: * Take your prescribed MEDICATION(S) * Follow a DIABETIC diet: Complex carbs, fiber rich foods, (monounsaturated and polyunsaturated) fats * AVOID saturated/trans fats, high fat foods, >2,300 mg salt per day. * EXERCISE at least 5 times a week for 30 minutes or preferably daily.  * DO NOT SMOKE OR DRINK more than 1 drink a day. * Check your FEET every day. Do not wear tightfitting shoes. Contact us if you develop an ulcer * See your EYE doctor once a year or more if needed * Get a FLU shot once a year * Get a PNEUMONIA vaccine once before and once after age 70 years  GOALS:  * Your Hemoglobin A1c of <7%  * fasting sugars need to be <130 * after meals sugars need to be <180 (2h after you start eating) * Your Systolic BP should be 140 or lower  * Your Diastolic BP should be 80 or lower  * Your HDL (Good Cholesterol) should be 40 or higher  * Your LDL (Bad Cholesterol) should be 100 or lower. * Your Triglycerides should be 150 or lower  * Your Urine microalbumin (kidney function) should be <30 * Your Body Mass Index should be 25 or lower    Please consider the following ways to cut down carbs and fat and increase fiber and micronutrients in your diet: - substitute whole grain for white bread or pasta -  substitute brown rice for white rice - substitute 90-calorie flat bread pieces for slices of bread when possible - substitute sweet potatoes or yams for white potatoes - substitute humus for margarine - substitute tofu for cheese when possible - substitute almond or rice milk for regular milk (would not drink soy milk daily due to concern for soy estrogen influence on breast cancer risk) - substitute dark chocolate for other sweets when possible - substitute water - can add lemon or orange slices for taste  - for diet sodas (artificial sweeteners will trick your body that you can eat sweets without getting calories and will lead you to overeating and weight gain in the long run) - do not skip breakfast or other meals (this will slow down the metabolism and will result in more weight gain over time)  - can try smoothies made from fruit and almond/rice milk in am instead of regular breakfast - can also try old-fashioned (not instant) oatmeal made with almond/rice milk in am - order the dressing on the side when eating salad at a restaurant (pour less than half of the dressing on the salad) - eat as little meat as possible - can try juicing, but should not forget that juicing will get rid of the fiber, so would alternate with eating raw veg./fruits or drinking smoothies - use as little oil as possible, even when using olive oil - can dress a salad with a mix of balsamic vinegar and lemon juice, for e.g. - use agave nectar, stevia sugar, or regular sugar rather than artificial sweateners - steam or broil/roast veggies  - snack on veggies/fruit/nuts (unsalted, preferably) when possible, rather than processed foods - reduce or eliminate aspartame in diet (it is in diet sodas, chewing gum, etc) Read the labels!  Try to read Dr. Janene Harvey book: "Program for Reversing Diabetes" for other ideas for healthy eating.

## 2014-11-09 ENCOUNTER — Ambulatory Visit (INDEPENDENT_AMBULATORY_CARE_PROVIDER_SITE_OTHER): Payer: BLUE CROSS/BLUE SHIELD | Admitting: Podiatry

## 2014-11-09 ENCOUNTER — Ambulatory Visit (INDEPENDENT_AMBULATORY_CARE_PROVIDER_SITE_OTHER): Payer: BLUE CROSS/BLUE SHIELD

## 2014-11-09 ENCOUNTER — Encounter: Payer: Self-pay | Admitting: Podiatry

## 2014-11-09 ENCOUNTER — Other Ambulatory Visit: Payer: Self-pay | Admitting: *Deleted

## 2014-11-09 VITALS — BP 130/77 | HR 75 | Resp 16 | Ht 64.0 in | Wt 240.0 lb

## 2014-11-09 DIAGNOSIS — M79672 Pain in left foot: Secondary | ICD-10-CM | POA: Diagnosis not present

## 2014-11-09 DIAGNOSIS — M79671 Pain in right foot: Secondary | ICD-10-CM

## 2014-11-09 DIAGNOSIS — M7661 Achilles tendinitis, right leg: Secondary | ICD-10-CM

## 2014-11-09 DIAGNOSIS — M779 Enthesopathy, unspecified: Secondary | ICD-10-CM | POA: Diagnosis not present

## 2014-11-09 MED ORDER — TRIAMCINOLONE ACETONIDE 10 MG/ML IJ SUSP
10.0000 mg | Freq: Once | INTRAMUSCULAR | Status: AC
  Administered 2014-11-09: 10 mg

## 2014-11-09 MED ORDER — LIRAGLUTIDE 18 MG/3ML ~~LOC~~ SOPN: 1.8000 mg | PEN_INJECTOR | Freq: Every day | SUBCUTANEOUS | Status: DC

## 2014-11-09 NOTE — Telephone Encounter (Signed)
Request for 90 day supply of Victoza to [facility].

## 2014-11-11 NOTE — Progress Notes (Signed)
Subjective:     Patient ID: Donna Booker, female   DOB: [birth year], 69 y.o.   MRN: [id number]  HPI patient presents stating I have had a lot of pain across the top of my foot right and I have had some discomfort in the back of the heel around the Achilles tendon both feet and this is been going on for a while   Review of Systems     Objective:   Physical Exam  neurovascular status unchanged with discomfort in the dorsal aspect of the tendon complex right and mild discomfort in the posterior heel region bilateral. Patient has moderate depression of the arch but I did note that sharp dull and vibratory are intact    Assessment:      dorsal tendinitis right with inflammation along with Achilles tendinitis bilateral that's mild to moderate in intensity    Plan:      H&P and x-rays reviewed with patient. Today I did a dorsal injection right 3 mg Kenalog 5 mg Xylocaine advised on heat and ice therapy and reappoint to recheck

## 2014-12-01 ENCOUNTER — Ambulatory Visit: Payer: BLUE CROSS/BLUE SHIELD | Admitting: Internal Medicine

## 2014-12-10 ENCOUNTER — Other Ambulatory Visit: Payer: Self-pay | Admitting: *Deleted

## 2014-12-10 MED ORDER — INSULIN DETEMIR 100 UNIT/ML FLEXPEN: 30.0000 [IU] | PEN_INJECTOR | Freq: Every day | SUBCUTANEOUS | Status: DC

## 2014-12-21 ENCOUNTER — Ambulatory Visit: Payer: BLUE CROSS/BLUE SHIELD | Admitting: Internal Medicine

## 2014-12-29 ENCOUNTER — Encounter: Payer: Self-pay | Admitting: Internal Medicine

## 2014-12-29 ENCOUNTER — Other Ambulatory Visit (INDEPENDENT_AMBULATORY_CARE_PROVIDER_SITE_OTHER): Payer: BLUE CROSS/BLUE SHIELD

## 2014-12-29 ENCOUNTER — Ambulatory Visit (INDEPENDENT_AMBULATORY_CARE_PROVIDER_SITE_OTHER): Payer: BLUE CROSS/BLUE SHIELD | Admitting: Internal Medicine

## 2014-12-29 VITALS — BP 126/82 | HR 90 | Temp 98.3°F | Resp 20 | Ht 64.0 in | Wt 233.1 lb

## 2014-12-29 DIAGNOSIS — IMO0002 Reserved for concepts with insufficient information to code with codable children: Secondary | ICD-10-CM

## 2014-12-29 DIAGNOSIS — E1165 Type 2 diabetes mellitus with hyperglycemia: Secondary | ICD-10-CM

## 2014-12-29 DIAGNOSIS — E1142 Type 2 diabetes mellitus with diabetic polyneuropathy: Secondary | ICD-10-CM

## 2014-12-29 DIAGNOSIS — Z23 Encounter for immunization: Secondary | ICD-10-CM

## 2014-12-29 DIAGNOSIS — Z794 Long term (current) use of insulin: Secondary | ICD-10-CM

## 2014-12-29 DIAGNOSIS — I10 Essential (primary) hypertension: Secondary | ICD-10-CM | POA: Diagnosis not present

## 2014-12-29 LAB — HEMOGLOBIN A1C: HEMOGLOBIN A1C: 9.5 % — AB (ref 4.6–6.5)

## 2014-12-29 LAB — BASIC METABOLIC PANEL
BUN: 26 mg/dL — ABNORMAL HIGH (ref 6–23)
CALCIUM: 9.5 mg/dL (ref 8.4–10.5)
CHLORIDE: 104 meq/L (ref 96–112)
CO2: 26 mEq/L (ref 19–32)
CREATININE: 1.2 mg/dL (ref 0.40–1.20)
GFR: 57.55 mL/min — ABNORMAL LOW (ref >60.00)
Glucose, Bld: 207 mg/dL — ABNORMAL HIGH (ref 70–99)
Potassium: 5.1 mEq/L (ref 3.5–5.1)
Sodium: 138 mEq/L (ref 135–145)

## 2014-12-29 NOTE — Patient Instructions (Signed)
We will check the blood work today for the sugars. Go back to see Dr Tera Helper to check on the sugars.   We will not change the medicines today.   Diabetes and Exercise Exercising regularly is important. It is not just about losing weight. It has many health benefits, such as:  Improving your overall fitness, flexibility, and endurance.  Increasing your bone density.  Helping with weight control.  Decreasing your body fat.  Increasing your muscle strength.  Reducing stress and tension.  Improving your overall health. People with diabetes who exercise gain additional benefits because exercise:  Reduces appetite.  Improves the body's use of blood sugar (glucose).  Helps lower or control blood glucose.  Decreases blood pressure.  Helps control blood lipids (such as cholesterol and triglycerides).  Improves the body's use of the hormone insulin by:  Increasing the body's insulin sensitivity.  Reducing the body's insulin needs.  Decreases the risk for heart disease because exercising:  Lowers cholesterol and triglycerides levels.  Increases the levels of good cholesterol (such as high-density lipoproteins [HDL]) in the body.  Lowers blood glucose levels. YOUR ACTIVITY PLAN  Choose an activity that you enjoy, and set realistic goals. To exercise safely, you should begin practicing any new physical activity slowly, and gradually increase the intensity of the exercise over time. Your health care provider or diabetes educator can help create an activity plan that works for you. General recommendations include:  Encouraging children to engage in at least 60 minutes of physical activity each day.  Stretching and performing strength training exercises, such as yoga or weight lifting, at least 2 times per week.  Performing a total of at least 150 minutes of moderate-intensity exercise each week, such as brisk walking or water aerobics.  Exercising at least 3 days per  week, making sure you allow no more than 2 consecutive days to pass without exercising.  Avoiding long periods of inactivity (90 minutes or more). When you have to spend an extended period of time sitting down, take frequent breaks to walk or stretch. RECOMMENDATIONS FOR EXERCISING WITH TYPE 1 OR TYPE 2 DIABETES   Check your blood glucose before exercising. If blood glucose levels are greater than 240 mg/dL, check for urine ketones. Do not exercise if ketones are present.  Avoid injecting insulin into areas of the body that are going to be exercised. For example, avoid injecting insulin into:  The arms when playing tennis.  The legs when jogging.  Keep a record of:  Food intake before and after you exercise.  Expected peak times of insulin action.  Blood glucose levels before and after you exercise.  The type and amount of exercise you have done.  Review your records with your health care provider. Your health care provider will help you to develop guidelines for adjusting food intake and insulin amounts before and after exercising.  If you take insulin or oral hypoglycemic agents, watch for signs and symptoms of hypoglycemia. They include:  Dizziness.  Shaking.  Sweating.  Chills.  Confusion.  Drink plenty of water while you exercise to prevent dehydration or heat stroke. Body water is lost during exercise and must be replaced.  Talk to your health care provider before starting an exercise program to make sure it is safe for you. Remember, almost any type of activity is better than none.   This information is not intended to replace advice given to you by your health care provider. Make sure you discuss any questions  you have with your health care provider.   Document Released: 03/11/2003 Document Revised: 05/05/2014 Document Reviewed: 05/28/2012 Elsevier Interactive Patient Education [employer].

## 2014-12-29 NOTE — Progress Notes (Signed)
   Subjective:    Patient ID: Donna Booker, female    DOB: [birth year], 69 y.o.   MRN: [id number]  HPI The patient is a 69 YO female coming in for follow up of her blood pressure. She has been to endocrinology and they have adjusted her diabetes regimen. She is happy to be off the pump and is not eating as much. She is down about 10 pounds since last visit. No headaches, chest pains. She is still having some mild numbness with sensitivity in her feet.   Review of Systems  Constitutional: Negative for fever, activity change, appetite change, fatigue and unexpected weight change.  Respiratory: Negative for cough, chest tightness, shortness of breath and wheezing.   Cardiovascular: Negative for chest pain, palpitations and leg swelling.  Gastrointestinal: Negative for nausea, abdominal pain, diarrhea, constipation and abdominal distention.  Musculoskeletal: Negative.   Neurological: Negative for dizziness, weakness, light-headedness and headaches.       Pain in her feet      Objective:   Physical Exam  Constitutional: She is oriented to person, place, and time. She appears well-developed and well-nourished.  Overweight  HENT:  Head: Normocephalic and atraumatic.  Eyes: EOM are normal.  Neck: Normal range of motion.  Cardiovascular: Normal rate and regular rhythm.   Pulmonary/Chest: Effort normal and breath sounds normal. No respiratory distress. She has no wheezes. She has no rales.  Abdominal: Soft. Bowel sounds are normal. She exhibits no distension. There is no tenderness. There is no rebound.  Musculoskeletal: She exhibits no edema.  Neurological: She is alert and oriented to person, place, and time. Coordination normal.  Skin: Skin is warm and dry.  Psychiatric: She has a normal mood and affect.   Filed Vitals:   12/29/14 1620  BP: 126/82  Pulse: 90  Temp: 98.3 F (36.8 C)  TempSrc: Oral  Resp: 20  Height: 5\' 4"  (1.626 m)  Weight: 233 lb 1.9 oz (105.743 kg)  SpO2: 96%       Assessment & Plan:  Pneumonia 13 shot given at visit

## 2014-12-29 NOTE — Assessment & Plan Note (Signed)
Weight down about 10 pounds since last visit. Encouraged her to continue working on weight loss to help with her medical conditions.

## 2014-12-29 NOTE — Progress Notes (Signed)
Pre visit review using our clinic review tool, if applicable. No additional management support is needed unless otherwise documented below in the visit note. 

## 2014-12-29 NOTE — Assessment & Plan Note (Signed)
Missed visit with endocrinology (she got confused about what office she was going to) and encouraged her to call and reschedule. Checking HgA1c today. Prevnar 13 given at visit. Some neuropathy in her feet.

## 2014-12-29 NOTE — Assessment & Plan Note (Signed)
BP at goal on the olmesartan/amlodipine/hctz/spironolactone. Checking BMP again today as on so many K sparing medications for monitoring. No side effects.

## 2014-12-30 DIAGNOSIS — Z23 Encounter for immunization: Secondary | ICD-10-CM | POA: Diagnosis not present

## 2014-12-30 LAB — HEPATITIS C ANTIBODY: HCV Ab: NEGATIVE

## 2014-12-30 NOTE — Addendum Note (Signed)
Addended by: Conception Chancy R on: 12/30/2014 08:10 AM   Modules accepted: Orders

## 2015-01-27 ENCOUNTER — Ambulatory Visit (INDEPENDENT_AMBULATORY_CARE_PROVIDER_SITE_OTHER): Payer: BLUE CROSS/BLUE SHIELD | Admitting: Internal Medicine

## 2015-01-27 ENCOUNTER — Ambulatory Visit (INDEPENDENT_AMBULATORY_CARE_PROVIDER_SITE_OTHER)
Admission: RE | Admit: 2015-01-27 | Discharge: 2015-01-27 | Disposition: A | Discharge status: Home / Self Care | Payer: BLUE CROSS/BLUE SHIELD | Source: Ambulatory Visit | Attending: Internal Medicine | Admitting: Internal Medicine

## 2015-01-27 ENCOUNTER — Encounter: Payer: Self-pay | Admitting: Internal Medicine

## 2015-01-27 VITALS — BP 130/58 | HR 79 | Temp 98.5°F | Resp 18 | Ht 64.0 in | Wt 231.0 lb

## 2015-01-27 DIAGNOSIS — M79671 Pain in right foot: Secondary | ICD-10-CM | POA: Diagnosis not present

## 2015-01-27 MED ORDER — DICLOFENAC SODIUM 1 % TD GEL: 2.0000 g | Freq: Four times a day (QID) | TRANSDERMAL | Status: DC

## 2015-01-27 MED ORDER — TRAMADOL HCL 50 MG PO TABS: 50.0000 mg | ORAL_TABLET | Freq: Three times a day (TID) | ORAL | Status: DC | PRN

## 2015-01-27 NOTE — Progress Notes (Signed)
   Subjective:    Patient ID: Donna Booker, female    DOB: 11-Nov-1945, 70 y.o.   MRN: [id number]  HPI The patient is a 70 YO female coming in for right foot pain. She has had it off and on for the last several weeks. No injury to the area. She is a diabetic and has had no sores or wounds on her feet. Her feeling is limited to her feet. Has been trying tylenol and some cream from her foot doctor. She has not seen them about her pain. No fevers or chills. Mild relief with the tylenol but pain still waking her up at night. Worse all the time nothing makes it better. Some worse with walking.   Review of Systems  Constitutional: Positive for activity change. Negative for fever, appetite change, fatigue and unexpected weight change.  Respiratory: Negative.   Cardiovascular: Negative.   Gastrointestinal: Negative.   Musculoskeletal: Positive for myalgias, arthralgias and gait problem. Negative for back pain, neck pain and neck stiffness.  Skin: Negative.   Neurological: Negative.       Objective:   Physical Exam  Constitutional: She is oriented to person, place, and time. She appears well-developed and well-nourished.  Overweight  HENT:  Head: Normocephalic and atraumatic.  Eyes: EOM are normal.  Neck: Normal range of motion.  Cardiovascular: Normal rate and regular rhythm.   Pulmonary/Chest: Effort normal and breath sounds normal. No respiratory distress. She has no wheezes. She has no rales.  Abdominal: Soft. Bowel sounds are normal. She exhibits no distension. There is no tenderness. There is no rebound.  Musculoskeletal: She exhibits no edema.  Tenderness on the surface of the foot along the midfoot. Some soft tissue swelling. No skin color change or wound. Mild callusing on the bottom of the foot.   Neurological: She is alert and oriented to person, place, and time. Coordination normal.  Skin: Skin is warm and dry.  Psychiatric: She has a normal mood and affect.   Filed Vitals:   01/27/15 1050  BP: 130/58  Pulse: 79  Temp: 98.5 F (36.9 C)  TempSrc: Oral  Resp: 18  Height: 5\' 4"  (1.626 m)  Weight: 231 lb (104.781 kg)  SpO2: 97%      Assessment & Plan:

## 2015-01-27 NOTE — Patient Instructions (Signed)
We have given you a medicine for pain that you can take up to 3 times a day as needed. It is called tramadol.   We have also sent in voltaren gel which you can put on the painful area up to 4 times a day.   We are checking the x-ray of the foot today and will call back with results.

## 2015-01-27 NOTE — Progress Notes (Signed)
Pre visit review using our clinic review tool, if applicable. No additional management support is needed unless otherwise documented below in the visit note. 

## 2015-01-28 DIAGNOSIS — M25562 Pain in left knee: Secondary | ICD-10-CM | POA: Insufficient documentation

## 2015-01-28 DIAGNOSIS — M79671 Pain in right foot: Secondary | ICD-10-CM | POA: Insufficient documentation

## 2015-01-28 NOTE — Assessment & Plan Note (Signed)
Given her diabetes and mild neuropathy checking right foot x-ray for any stress fracture. Rx for tramadol for pain. Advised to use heat on the area and shoes with good support.

## 2015-02-25 ENCOUNTER — Ambulatory Visit (INDEPENDENT_AMBULATORY_CARE_PROVIDER_SITE_OTHER): Payer: BLUE CROSS/BLUE SHIELD | Admitting: Internal Medicine

## 2015-02-25 ENCOUNTER — Encounter: Payer: Self-pay | Admitting: Internal Medicine

## 2015-02-25 VITALS — BP 114/60 | HR 98 | Temp 98.3°F | Resp 12 | Wt 232.0 lb

## 2015-02-25 DIAGNOSIS — Z794 Long term (current) use of insulin: Secondary | ICD-10-CM | POA: Diagnosis not present

## 2015-02-25 DIAGNOSIS — E1165 Type 2 diabetes mellitus with hyperglycemia: Secondary | ICD-10-CM | POA: Diagnosis not present

## 2015-02-25 DIAGNOSIS — E1142 Type 2 diabetes mellitus with diabetic polyneuropathy: Secondary | ICD-10-CM

## 2015-02-25 DIAGNOSIS — IMO0002 Reserved for concepts with insufficient information to code with codable children: Secondary | ICD-10-CM

## 2015-02-25 DIAGNOSIS — E118 Type 2 diabetes mellitus with unspecified complications: Secondary | ICD-10-CM | POA: Insufficient documentation

## 2015-02-25 MED ORDER — INSULIN DETEMIR 100 UNIT/ML FLEXPEN: 35.0000 [IU] | PEN_INJECTOR | Freq: Every day | SUBCUTANEOUS | Status: DC

## 2015-02-25 NOTE — Patient Instructions (Signed)
Patient Instructions  Please continue: - Victoza 1.8 mg daily in am  Please increase: - Levemir to 35 units at bedtime   - Move Metformin 1000 mg to dinnertime   Check sugars 2x a day and write them down.   Please return in 1.5 months with your sugar log.   Please call and schedule an eye appt with Dr. Randon Goldsmith: [facility]:  Dr. Jeni Salles MD ?  Address: [address] [place], [place], [place] [address]  [phone]

## 2015-02-25 NOTE — Progress Notes (Signed)
Patient ID: Donna Booker, female   DOB: Jul 22, 1945, 70 y.o.   MRN: [id number]  HPI: Donna Booker is a 70 y.o.-year-old female, returning for f/u for DM2, dx in 1987, insulin-dependent since 2006, uncontrolled, with complications (PN, stage 2 CKD). Last visit 4 mo ago.  She had several cousins and also her sister that died since 13-Sep-2014.  Her husband is also dying.   Last hemoglobin A1c was: Lab Results  Component Value Date   HGBA1C 9.5* 12/29/2014   HGBA1C 9.1* 09/01/2014   Pt wason a regimen of: - Metformin 1000 mg in am - VGo 30 - initially with 1-2 meal clicks, now off these - started 2015 She was on Lantus 30, but taken off to start the VGo. She was on Victoza. She tried Bydureon (02/2013-10/2014) >> made her hungry, did not lower sugars. She also tried Trulicity 1.5 mg (10/2013-04/2014) >> did not lower her sugars.  At last visit, per her request, we stopped VGo and started: - Levemir 30 units at bedtime - Metformin 1000 mg with dinner - Victoza 1.8 mg daily in am  Pt checked her sugars 2x a day (not in last month!) - am: 147-160 >> 90, 120-152, 287 (forgot insulin) - 2h after b'fast: n/c - before lunch: 98-120 >> 120-130 - 2h after lunch: n/c - before dinner: n/c >> 130-144 - 2h after dinner: n/c - bedtime: 120s >> 125-145 - nighttime: n/c No lows. Lowest sugar was 97 >> 90; she has hypoglycemia awareness at 70.  Highest sugar was 228 >> 287.  Glucometer: AccuChek  Pt's meals are: - Breakfast: coffee, special K cereals; sometimes bacon + egg, toast; pancakes - Lunch: sandwich; hamburger, salad - Dinner: - Snacks: 2: fresh fruit, dried fruit, smtms icecream  - + stage 2 CKD, last BUN/creatinine:  Lab Results  Component Value Date   BUN 26* 12/29/2014   CREATININE 1.20 12/29/2014  On Olmesartan. - last set of lipids: Lab Results  Component Value Date   CHOL 124 09/01/2014   HDL 35.30* 09/01/2014   LDLCALC 55 09/01/2014   TRIG 171.0* 09/01/2014   CHOLHDL 4 09/01/2014  On Pitavastatin. - last eye exam was in 01/2014. No DR.  - no numbness and tingling in her feet.  ROS: Constitutional: + weight loss, no fatigue, no subjective hyperthermia/hypothermia, + nocturia  Eyes: no blurry vision, no xerophthalmia ENT: no sore throat, no nodules palpated in throat, no dysphagia/odynophagia, no hoarseness Cardiovascular: no CP/SOB/palpitations/leg swelling Respiratory: no cough/SOB Gastrointestinal: no N/V/D/C Musculoskeletal: no muscle/joint aches Skin: no rashes Neurological: no tremors/numbness/tingling/dizziness  I reviewed pt's medications, allergies, PMH, social hx, family hx, and changes were documented in the history of present illness. Otherwise, unchanged from my initial visit note.  Past Medical History  Diagnosis Date  . Diabetes mellitus   . Hypertension    Past Surgical History  Procedure Laterality Date  . Breast surgery     Social History   Social History  . Marital Status: Married    Spouse Name: N/A  . Number of Children: 1   Occupational History  . 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 Smoking status: Never Smoker   . Smokeless tobacco: Not on file  . Alcohol Use: No  . Drug Use: No   Current Outpatient Prescriptions on File Prior to Visit  Medication Sig Dispense Refill  . aspirin EC 81 MG tablet Take 81 mg by mouth daily.    . diclofenac sodium (VOLTAREN) 1 %  GEL Apply 2 g topically 4 (four) times daily. 100 g 3  . esomeprazole (NEXIUM) 40 MG capsule Take 40 mg by mouth daily before breakfast.    . Insulin Detemir (LEVEMIR) 100 UNIT/ML Pen Inject 30 Units into the skin daily at 10 pm. 30 mL 0  . Liraglutide (VICTOZA) 18 MG/3ML SOPN Inject 0.3 mLs (1.8 mg total) into the skin daily before breakfast. 9 pen 1  . metFORMIN (GLUCOPHAGE) 1000 MG tablet Take 1 tablet (1,000 mg total) by mouth daily with supper. 90 tablet 1  . Olmesartan-Amlodipine-HCTZ (TRIBENZOR) 40-10-25 MG TABS Take 1 tablet  by mouth daily. 90 tablet 3  . Pitavastatin Calcium 1 MG TABS Take 1 tablet (1 mg total) by mouth daily. 90 tablet 3  . spironolactone (ALDACTONE) 25 MG tablet Take 25 mg by mouth daily.    . traMADol (ULTRAM) 50 MG tablet Take 1 tablet (50 mg total) by mouth every 8 (eight) hours as needed. 30 tablet 0   No current facility-administered medications on file prior to visit.   No Known Allergies   FH - see HPI  PE: BP 114/60 mmHg  Pulse 98  Temp(Src) 98.3 F (36.8 C) (Oral)  Resp 12  Wt 232 lb (105.235 kg)  SpO2 97% Wt Readings from Last 3 Encounters:  02/25/15 232 lb (105.235 kg)  01/27/15 231 lb (104.781 kg)  12/29/14 233 lb 1.9 oz (105.743 kg)   Constitutional: overweight, in NAD Eyes: PERRLA, EOMI, no exophthalmos ENT: moist mucous membranes, no thyromegaly, no cervical lymphadenopathy Cardiovascular: RRR, No MRG Respiratory: CTA B Gastrointestinal: abdomen soft, NT, ND, BS+ Musculoskeletal: no deformities, strength intact in all 4 Skin: moist, warm, no rashes Neurological: no tremor with outstretched hands, DTR normal in all 4  ASSESSMENT: 1. DM2, insulin-dependent, uncontrolled, with complications - PN - CKD stage 2  PLAN:  1. Patient with long-standing, uncontrolled diabetes, on half-max dose Metformin (forgot to move this at bedtime) + basal insulin + Victoza. Sugars slightly higher in am >> will again advise to move Metformin at dinnertime and increase Levemir dose a little. - I suggested to:  Patient Instructions   Patient Instructions  Please continue: - Victoza 1.8 mg daily in am  Please increase: - Levemir to 35 units at bedtime   - Move Metformin 1000 mg to dinnertime   Check sugars 2x a day and write them down.   Please return in 1.5 months with your sugar log.   Please call and schedule an eye appt with Dr. Randon Goldsmith: [facility]:  Dr. Jeni Salles, MD ?  Address: [address] [place], [place], [place] [address]  [phone]  [phone]  - restart checking sugars at different times of the day - check 2 times a day, rotating checks - advised for yearly eye exams >> she needs one >> recommended Dr Randon Goldsmith - had flu shot this season - check hbA1c at next visit >> last was high, at 9.5% - Return to clinic in 1.5 mo with sugar log

## 2015-04-27 ENCOUNTER — Encounter: Payer: Self-pay | Admitting: Internal Medicine

## 2015-04-27 ENCOUNTER — Ambulatory Visit (INDEPENDENT_AMBULATORY_CARE_PROVIDER_SITE_OTHER): Payer: BLUE CROSS/BLUE SHIELD | Admitting: Internal Medicine

## 2015-04-27 ENCOUNTER — Other Ambulatory Visit (INDEPENDENT_AMBULATORY_CARE_PROVIDER_SITE_OTHER): Payer: BLUE CROSS/BLUE SHIELD | Admitting: *Deleted

## 2015-04-27 VITALS — BP 120/64 | HR 91 | Temp 98.0°F | Resp 12 | Wt 229.0 lb

## 2015-04-27 DIAGNOSIS — IMO0002 Reserved for concepts with insufficient information to code with codable children: Secondary | ICD-10-CM

## 2015-04-27 DIAGNOSIS — E1142 Type 2 diabetes mellitus with diabetic polyneuropathy: Secondary | ICD-10-CM

## 2015-04-27 DIAGNOSIS — Z794 Long term (current) use of insulin: Secondary | ICD-10-CM

## 2015-04-27 DIAGNOSIS — E1165 Type 2 diabetes mellitus with hyperglycemia: Secondary | ICD-10-CM | POA: Diagnosis not present

## 2015-04-27 LAB — POCT GLYCOSYLATED HEMOGLOBIN (HGB A1C): HEMOGLOBIN A1C: 10

## 2015-04-27 LAB — HM DIABETES EYE EXAM

## 2015-04-27 MED ORDER — GLIPIZIDE 5 MG PO TABS: 5.0000 mg | ORAL_TABLET | Freq: Two times a day (BID) | ORAL | Status: DC

## 2015-04-27 NOTE — Progress Notes (Signed)
Patient ID: Donna Booker, female   DOB: 1945/04/19, 70 y.o.   MRN: [id number]  HPI: Donna Booker is a 70 y.o.-year-old female, returning for f/u for DM2, dx in 1987, insulin-dependent since 2006, uncontrolled, with complications (PN, stage 2 CKD). Last visit 4 mo ago.  She started Weight Watchers.   Last hemoglobin A1c was: Lab Results  Component Value Date   HGBA1C 9.5* 12/29/2014   HGBA1C 9.1* 09/01/2014   Pt wason a regimen of: - Metformin 1000 mg in am - VGo 30 - initially with 1-2 meal clicks, now off these - started 2015 She was on Lantus 30, but taken off to start the VGo. She was on Victoza. She tried Bydureon (02/2013-10/2014) >> made her hungry, did not lower sugars. She also tried Trulicity 1.5 mg (10/2013-04/2014) >> did not lower her sugars.  She is now: - Levemir 35 units at bedtime - misses 1-2x a week - Metformin 1000 mg with dinner - Victoza 1.8 mg daily in am  Pt checked her sugars 2x a day - not in last month - meter broke. Sugars were much higher> - am: 147-160 >> 90, 120-152, 287 (forgot insulin) >> 94, 125-227 - 2h after b'fast: n/c - before lunch: 98-120 >> 120-130 >> 175 - 2h after lunch: n/c >> 200 - before dinner: n/c >> 130-144 >> 135-159, 235, 300s - 2h after dinner: n/c - bedtime: 120s >> 125-145 >> n/c - nighttime: n/c No lows. Lowest sugar was 97 >> 90 >> 75; she has hypoglycemia awareness at 70.  Highest sugar was 228 >> 287 >> 300x1.  Glucometer: AccuChek  Pt's meals are: - Breakfast: coffee, special K cereals; sometimes bacon + egg, toast; pancakes - Lunch: sandwich; hamburger, salad - Dinner: - Snacks: 2: fresh fruit, dried fruit, icecream  - + stage 2 CKD, last BUN/creatinine:  Lab Results  Component Value Date   BUN 26* 12/29/2014   CREATININE 1.20 12/29/2014  On Olmesartan. - last set of lipids: Lab Results  Component Value Date   CHOL 124 09/01/2014   HDL 35.30* 09/01/2014   LDLCALC 55 09/01/2014   TRIG 171.0*  09/01/2014   CHOLHDL 4 09/01/2014  On Pitavastatin. - last eye exam was in 01/2014 >> 04/27/2015. No DR.  - no numbness and tingling in her feet.  ROS: Constitutional: no weight loss/gain, no fatigue, no subjective hyperthermia/hypothermia, + nocturia  Eyes: no blurry vision, no xerophthalmia ENT: no sore throat, no nodules palpated in throat, no dysphagia/odynophagia, no hoarseness Cardiovascular: no CP/SOB/palpitations/leg swelling Respiratory: no cough/SOB Gastrointestinal: no N/V/D/C Musculoskeletal: no muscle/joint aches Skin: no rashes Neurological: no tremors/numbness/tingling/dizziness  I reviewed pt's medications, allergies, PMH, social hx, family hx, and changes were documented in the history of present illness. Otherwise, unchanged from my initial visit note.  Past Medical History  Diagnosis Date  . Diabetes mellitus   . Hypertension    Past Surgical History  Procedure Laterality Date  . Breast surgery     Social History   Social History  . Marital Status: Married    Spouse Name: N/A  . Number of Children: 1   Occupational History  . 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 Smoking status: Never Smoker   . Smokeless tobacco: Not on file  . Alcohol Use: No  . Drug Use: No   Current Outpatient Prescriptions on File Prior to Visit  Medication Sig Dispense Refill  . aspirin EC 81 MG tablet Take 81 mg by mouth  daily.    . diclofenac sodium (VOLTAREN) 1 % GEL Apply 2 g topically 4 (four) times daily. 100 g 3  . esomeprazole (NEXIUM) 40 MG capsule Take 40 mg by mouth daily before breakfast.    . Insulin Detemir (LEVEMIR) 100 UNIT/ML Pen Inject 35 Units into the skin daily at 10 pm. 45 mL 1  . Liraglutide (VICTOZA) 18 MG/3ML SOPN Inject 0.3 mLs (1.8 mg total) into the skin daily before breakfast. 9 pen 1  . metFORMIN (GLUCOPHAGE) 1000 MG tablet Take 1 tablet (1,000 mg total) by mouth daily with supper. 90 tablet 1  . Olmesartan-Amlodipine-HCTZ  (TRIBENZOR) 40-10-25 MG TABS Take 1 tablet by mouth daily. 90 tablet 3  . spironolactone (ALDACTONE) 25 MG tablet Take 25 mg by mouth daily.     No current facility-administered medications on file prior to visit.   Allergies  Allergen Reactions  . Tramadol Nausea And Vomiting     FH - see HPI  PE: BP 120/64 mmHg  Pulse 91  Temp(Src) 98 F (36.7 C) (Oral)  Resp 12  Wt 229 lb (103.874 kg)  SpO2 97% Wt Readings from Last 3 Encounters:  04/27/15 229 lb (103.874 kg)  02/25/15 232 lb (105.235 kg)  01/27/15 231 lb (104.781 kg)   Constitutional: overweight, in NAD Eyes: PERRLA, EOMI, no exophthalmos ENT: moist mucous membranes, no thyromegaly, no cervical lymphadenopathy Cardiovascular: RRR, No MRG Respiratory: CTA B Gastrointestinal: abdomen soft, NT, ND, BS+ Musculoskeletal: no deformities, strength intact in all 4 Skin: moist, warm, no rashes Neurological: no tremor with outstretched hands, DTR normal in all 4  ASSESSMENT: 1. DM2, insulin-dependent, uncontrolled, with complications - PN - CKD stage 2  PLAN:  1. Patient with long-standing, uncontrolled diabetes, on half-max dose Metformin at bedtime + basal insulin + Victoza. Sugars higher as she forgets doses of her meds and had dietary indiscretions - icecream almost every day. Will add Glipizide and refer her to nutrition. Will also move Levemir after dinner so that she dos not forget it. - I suggested to:  Patient Instructions  Please continue  - Levemir 35 units - move it after dinner. - Metformin 1000 mg with dinner - Victoza 1.8 mg daily in am  Please start Glipizide 5 mg 2x a day before b'fast and dinner.   Please schedule an appt with Oran Rein with nutrition.  - restart checking sugars at different times of the day - check 2 times a day, rotating checks >> given new meter - advised for yearly eye exams >> she is UTD. Will call for records. - had flu shot this season - checked HbA1c today >> 10%  (higher) - Return to clinic in 3 mo with sugar log

## 2015-04-27 NOTE — Patient Instructions (Addendum)
Please continue  - Levemir 35 units - move it after dinner. - Metformin 1000 mg with dinner - Victoza 1.8 mg daily in am  Please start Glipizide 5 mg 2x a day before b'fast and dinner.   Please schedule an appt with Oran Rein with nutrition.  Please consider the following ways to cut down carbs and fat and increase fiber and micronutrients in your diet: - substitute whole grain for white bread or pasta - substitute brown rice for white rice - substitute 90-calorie flat bread pieces for slices of bread when possible - substitute sweet potatoes or yams for white potatoes - substitute humus for margarine - substitute tofu for cheese when possible - substitute almond or rice milk for regular milk (would not drink soy milk daily due to concern for soy estrogen influence on breast cancer risk) - substitute dark chocolate for other sweets when possible - substitute water - can add lemon or orange slices for taste - for diet sodas (artificial sweeteners will trick your body that you can eat sweets without getting calories and will lead you to overeating and weight gain in the long run) - do not skip breakfast or other meals (this will slow down the metabolism and will result in more weight gain over time)  - can try smoothies made from fruit and almond/rice milk in am instead of regular breakfast - can also try old-fashioned (not instant) oatmeal made with almond/rice milk in am - order the dressing on the side when eating salad at a restaurant (pour less than half of the dressing on the salad) - eat as little meat as possible - can try juicing, but should not forget that juicing will get rid of the fiber, so would alternate with eating raw veg./fruits or drinking smoothies - use as little oil as possible, even when using olive oil - can dress a salad with a mix of balsamic vinegar and lemon juice, for e.g. - use agave nectar, stevia sugar, or regular sugar rather than artificial sweateners -  steam or broil/roast veggies  - snack on veggies/fruit/nuts (unsalted, preferably) when possible, rather than processed foods - reduce or eliminate aspartame in diet (it is in diet sodas, chewing gum, etc) Read the labels!  Try to read Dr. Katherina Right book: "Program for Reversing Diabetes" for other ideas for healthy eating.

## 2015-04-30 ENCOUNTER — Other Ambulatory Visit: Payer: Self-pay | Admitting: *Deleted

## 2015-04-30 MED ORDER — SPIRONOLACTONE 25 MG PO TABS: 25.0000 mg | ORAL_TABLET | Freq: Every day | ORAL | Status: DC

## 2015-04-30 MED ORDER — METFORMIN HCL 1000 MG PO TABS: 1000.0000 mg | ORAL_TABLET | Freq: Every day | ORAL | Status: DC

## 2015-04-30 NOTE — Telephone Encounter (Signed)
Left msg on triage requesting refills on her spironolactone & metformin... Called pt back to verify pharmacy [facility].I sent the Rx to the pharmacy. Electronically./lmb

## 2015-05-25 ENCOUNTER — Telehealth: Payer: Self-pay | Admitting: Internal Medicine

## 2015-05-25 ENCOUNTER — Other Ambulatory Visit: Payer: Self-pay | Admitting: Internal Medicine

## 2015-05-25 DIAGNOSIS — E1142 Type 2 diabetes mellitus with diabetic polyneuropathy: Secondary | ICD-10-CM

## 2015-05-25 DIAGNOSIS — E1165 Type 2 diabetes mellitus with hyperglycemia: Principal | ICD-10-CM

## 2015-05-25 DIAGNOSIS — Z794 Long term (current) use of insulin: Principal | ICD-10-CM

## 2015-05-25 DIAGNOSIS — IMO0002 Reserved for concepts with insufficient information to code with codable children: Secondary | ICD-10-CM

## 2015-05-25 NOTE — Telephone Encounter (Signed)
Referral is in.

## 2015-05-25 NOTE — Telephone Encounter (Signed)
Please read message below and be advised.  

## 2015-05-25 NOTE — Telephone Encounter (Signed)
Diabetes/Nutrition called and said that they had to r/s the appt with Donna Booker and they just need a new referral for the new date she will be seen.

## 2015-05-26 ENCOUNTER — Other Ambulatory Visit: Payer: Self-pay | Admitting: *Deleted

## 2015-05-26 MED ORDER — OLMESARTAN-AMLODIPINE-HCTZ 40-10-25 MG PO TABS: 1.0000 | ORAL_TABLET | Freq: Every day | ORAL | Status: DC

## 2015-05-26 MED ORDER — LIRAGLUTIDE 18 MG/3ML ~~LOC~~ SOPN: 1.8000 mg | PEN_INJECTOR | Freq: Every day | SUBCUTANEOUS | Status: DC

## 2015-05-26 NOTE — Telephone Encounter (Signed)
Left msg on triage needing refills on her BP med & victoza. Called pt back verified where she want refills sent. [facility]...Donna Booker

## 2015-05-28 ENCOUNTER — Encounter: Payer: BLUE CROSS/BLUE SHIELD | Admitting: Dietician

## 2015-06-03 ENCOUNTER — Encounter: Payer: Self-pay | Admitting: Internal Medicine

## 2015-06-24 ENCOUNTER — Ambulatory Visit: Payer: BLUE CROSS/BLUE SHIELD | Admitting: Dietician

## 2015-06-29 ENCOUNTER — Ambulatory Visit: Payer: BLUE CROSS/BLUE SHIELD | Admitting: Internal Medicine

## 2015-07-16 ENCOUNTER — Encounter: Payer: Self-pay | Admitting: Dietician

## 2015-07-16 ENCOUNTER — Ambulatory Visit (INDEPENDENT_AMBULATORY_CARE_PROVIDER_SITE_OTHER): Payer: BLUE CROSS/BLUE SHIELD | Admitting: Internal Medicine

## 2015-07-16 ENCOUNTER — Encounter: Payer: BLUE CROSS/BLUE SHIELD | Attending: Internal Medicine | Admitting: Dietician

## 2015-07-16 ENCOUNTER — Encounter: Payer: Self-pay | Admitting: Internal Medicine

## 2015-07-16 VITALS — Ht 65.0 in | Wt 225.0 lb

## 2015-07-16 DIAGNOSIS — E1165 Type 2 diabetes mellitus with hyperglycemia: Secondary | ICD-10-CM | POA: Diagnosis not present

## 2015-07-16 DIAGNOSIS — Z713 Dietary counseling and surveillance: Secondary | ICD-10-CM | POA: Insufficient documentation

## 2015-07-16 DIAGNOSIS — I10 Essential (primary) hypertension: Secondary | ICD-10-CM | POA: Diagnosis not present

## 2015-07-16 DIAGNOSIS — E118 Type 2 diabetes mellitus with unspecified complications: Secondary | ICD-10-CM

## 2015-07-16 DIAGNOSIS — E1142 Type 2 diabetes mellitus with diabetic polyneuropathy: Secondary | ICD-10-CM | POA: Diagnosis present

## 2015-07-16 DIAGNOSIS — Z794 Long term (current) use of insulin: Secondary | ICD-10-CM | POA: Insufficient documentation

## 2015-07-16 DIAGNOSIS — IMO0002 Reserved for concepts with insufficient information to code with codable children: Secondary | ICD-10-CM

## 2015-07-16 MED ORDER — GLIPIZIDE 5 MG PO TABS
5.0000 mg | ORAL_TABLET | Freq: Two times a day (BID) | ORAL | Status: DC
Start: 2015-07-16 — End: 2016-02-07

## 2015-07-16 MED ORDER — SPIRONOLACTONE 25 MG PO TABS: 25.0000 mg | ORAL_TABLET | Freq: Every day | ORAL | Status: DC

## 2015-07-16 NOTE — Progress Notes (Signed)
Medical Nutrition Therapy:  Appt start time: 0800 end time:  0915.   Assessment:  Primary concerns today: Patient is here alone.  She would like to learn more about what foods to eat.  She has had type 2 diabetes since 1986.  She went to a diabetes class in the 90's but no further education since.  Other hx includes HTN.  She has not checked her blood sugar for the past 3 weeks.  She gets frustrated over the numbers.  Her MD recommended 3 times per day but she states that this is too much with work.  She checks her feet daily, gets eyes examined yearly, and wears dentures.  Labs include:  Cholesterol 124, trig 171, HDL 35, LDL 55 09/01/14.  A1C of 10% 04/27/15 increased from 9 since 2022/10/02 and GFR of 57 12/24/15.  She has had recent steroid injections.     Weight hx: Lowest adult weight:  189 lbs 1986 Highest adult weight:  280 lbs 10 years ago. She has maintained her current weight of 225 lbs for the past several years.  Weight was lost with increased GERD. Goal weight less than 200 lbs.  Patient lives with her husband and son.  She does the shopping and cooking.  Her son has type 2 diabetes and husband and son are supportive of healthy eating.  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  Her sister died last 02-Oct-2022.  She had diabetes and decided to stop taking her medication.  Donna Booker was her caregive.  Her brother is in a NH in [place] and patient will visit him on the weekends.    Preferred Learning Style:   No preference indicated   Learning Readiness:   Ready  MEDICATIONS: see list to include:  Glipizide, levemir 35 units q HS, Victoza 18 units q am, metformin.   DIETARY INTAKE: Desserts on the weekends.  Slurpies 2 x per week Dislikes leftovers.  She enjoys baking cake and pie.  Usual eating pattern includes 3 meals and 3 snacks per day.  24-hr recall:  B ( AM): coffee with coffee mate, plain special K cereal and Lactaid 2% milk OR pancake with  sugar free syrup, 1 strip bacon or sausage OR ham and eggs and toast with butter. Snk ( AM): fruit  L ( PM): [place] or ham sandwich occasionally OR 6" cold cut sandwich at subway OR vienna sausage and ritz crackers OR seldom salad OR Weight Watcher's meals OR Banquet chicken pie Snk ( PM): fruit D ( PM): salad out to eat OR baked, BBQ'd or fried chicken or pork chops, potato, peas, green beans, biscuit or roll or cornbread Snk ( PM): Weight Watchers giant fudge pop sickle OR 2 scoops ice cream Beverages: diet Dr. Reino Kent, unsweetened tea with equal, water  Usual physical activity:  none  Estimated energy needs: 1400 calories 158 g carbohydrates 88 g protein 47 g fat  Progress Towards Goal(s):  In progress.   Nutritional Diagnosis:  NB-1.1 Food and nutrition-related knowledge deficit As related to balance of carbohydrate, protein, and fat.  As evidenced by diet hx.    Intervention:  Nutrition counseling and diabetes education initiated. Discussed Carb Counting by food group as method of portion control, reading food labels, and benefits of increased activity. Also discussed basic physiology of Diabetes, target BG ranges pre and post meals, and A1c.   Be as active as possible.  Aim for 30 minutes most days.  Walking during your breaks  at work would be great!  Consider a Pedometer app on your phone to count your steps.   Get a good pair of walking shoes. [name] is a good brand.) Have a small amount of protein with each meal and snack. Begin checking your blood sugar.  Rotate the times of day that you check.  Aim for 2-3 Carb Choices per meal (30-45 grams) +/- 1 either way  Aim for 0-1 Carbs per snack if hungry  Include protein in moderation with your meals and snacks Consider reading food labels for Total Carbohydrate and Fat Grams of foods Continue taking medication as directed by MD  Teaching Method Utilized:  Visual Auditory Hands on  Handouts given during visit  include: Living Well with Diabetes Carb Counting and Food Label handouts Meal Plan Card Snack list  Label reading  Barriers to learning/adherence to lifestyle change: increased stress and decreased time  Demonstrated degree of understanding via:  Teach Back   Monitoring/Evaluation:  Dietary intake, exercise, label reading, and body weight prn.

## 2015-07-16 NOTE — Progress Notes (Signed)
   Subjective:    Patient ID: Donna Booker, female    DOB: 1945/03/02, 70 y.o.   MRN: [id number]  HPI The patient is a 70 YO female coming in for follow up of her blood pressure (taking olmesartan/hctz/amlodipine and spironolactone). She is doing well overall. Not exercising right now. Diet is poor although she is motivated by a 67 month old grandson to improve her health. Spoke with nutritionist today and will begin modifying her diet right now. She is struggling with her love of ice cream and they talked about replacement at her visit.   Review of Systems  Constitutional: Negative for fever, activity change, appetite change, fatigue and unexpected weight change.  Respiratory: Negative for cough, chest tightness, shortness of breath and wheezing.   Cardiovascular: Negative for chest pain, palpitations and leg swelling.  Gastrointestinal: Negative for nausea, abdominal pain, diarrhea, constipation and abdominal distention.  Musculoskeletal: Positive for arthralgias.  Skin: Negative.   Neurological: Negative for dizziness, weakness, light-headedness and headaches.      Objective:   Physical Exam  Constitutional: She is oriented to person, place, and time. She appears well-developed and well-nourished.  Overweight  HENT:  Head: Normocephalic and atraumatic.  Eyes: EOM are normal.  Neck: Normal range of motion.  Cardiovascular: Normal rate and regular rhythm.   Pulmonary/Chest: Effort normal and breath sounds normal. No respiratory distress. She has no wheezes. She has no rales.  Abdominal: Soft. She exhibits no distension. There is no tenderness. There is no rebound.  Musculoskeletal: She exhibits no edema.     Neurological: She is alert and oriented to person, place, and time. Coordination normal.  Skin: Skin is warm and dry.   Filed Vitals:   07/16/15 1538  BP: 120/72  Pulse: 86  Temp: 97.7 F (36.5 C)  TempSrc: Oral  Weight: 225 lb 6.4 oz (102.241 kg)  SpO2: 95%        Assessment & Plan:

## 2015-07-16 NOTE — Assessment & Plan Note (Signed)
Is resolved to working on her diet with nutrition and getting back into exercise.

## 2015-07-16 NOTE — Patient Instructions (Signed)
Be as active as possible.  Aim for 30 minutes most days.  Walking during your breaks at work would be great!  Consider a Pedometer app on your phone to count your steps.   Get a good pair of walking shoes. Donna Booker is a good brand.) Have a small amount of protein with each meal and snack. Begin checking your blood sugar.  Rotate the times of day that you check.  Aim for 2-3 Carb Choices per meal (30-45 grams) +/- 1 either way  Aim for 0-1 Carbs per snack if hungry  Include protein in moderation with your meals and snacks Consider reading food labels for Total Carbohydrate and Fat Grams of foods Continue taking medication as directed by MD

## 2015-07-16 NOTE — Patient Instructions (Signed)
We will see you back in about 3 months for a physical.  Keep up the good work with starting the diet changes for the sugars.

## 2015-07-16 NOTE — Progress Notes (Signed)
Pre visit review using our clinic review tool, if applicable. No additional management support is needed unless otherwise documented below in the visit note. 

## 2015-07-16 NOTE — Assessment & Plan Note (Signed)
BP is at goal on amlodipine/hctz/olmesartan and spironolactone. Not checking labs today as visit with endo in several weeks.

## 2015-07-29 ENCOUNTER — Encounter: Payer: Self-pay | Admitting: Internal Medicine

## 2015-07-29 ENCOUNTER — Ambulatory Visit (INDEPENDENT_AMBULATORY_CARE_PROVIDER_SITE_OTHER): Payer: BLUE CROSS/BLUE SHIELD | Admitting: Internal Medicine

## 2015-07-29 VITALS — BP 120/70 | HR 80 | Wt 226.0 lb

## 2015-07-29 DIAGNOSIS — E1165 Type 2 diabetes mellitus with hyperglycemia: Secondary | ICD-10-CM | POA: Diagnosis not present

## 2015-07-29 DIAGNOSIS — IMO0002 Reserved for concepts with insufficient information to code with codable children: Secondary | ICD-10-CM

## 2015-07-29 DIAGNOSIS — E118 Type 2 diabetes mellitus with unspecified complications: Secondary | ICD-10-CM

## 2015-07-29 LAB — POCT GLYCOSYLATED HEMOGLOBIN (HGB A1C): HEMOGLOBIN A1C: 8.4

## 2015-07-29 NOTE — Patient Instructions (Signed)
Please continue  - Levemir 35 units after dinner - Metformin 1000 mg with dinner - Victoza 1.8 mg daily in am - Glipizide 5 mg 2x a day before b'fast and dinner.   Please come back for a follow-up appointment in 3 months.

## 2015-07-29 NOTE — Progress Notes (Signed)
Patient ID: Donna Booker, female   DOB: 04/11/1945, 70 y.o.   MRN: [id number]  HPI: Donna Booker is a 70 y.o.-year-old female, returning for f/u for DM2, dx in 1987, insulin-dependent since 2006, uncontrolled, with complications (PN, stage 2 CKD). Last visit 3 mo ago.  She started Weight Watchers before last visit.  She also saw dietitian since last visit.   Last hemoglobin A1c was: Lab Results  Component Value Date   HGBA1C 10.0 04/27/2015   HGBA1C 9.5 (H) 12/29/2014   HGBA1C 9.1 (H) 09/01/2014   Pt wason a regimen of: - Metformin 1000 mg in am - VGo 30 - initially with 1-2 meal clicks, now off these - started 2015 She was on Lantus 30, but taken off to start the VGo. She was on Victoza. She tried Bydureon (02/2013-10/2014) >> made her hungry, did not lower sugars. She also tried Trulicity 1.5 mg (10/2013-04/2014) >> did not lower her sugars.  She is now: - Levemir 35 units after dinner - Victoza 1.8 mg daily in am - Metformin 1000 mg with dinner - Glipizide 5 mg 2x a day before b'fast and dinner.   Pt checked her sugars 2x a day. Sugars are better: - am: 147-160 >> 90, 120-152, 287 (forgot insulin) >> 94, 125-227 >> 99-138, 161 - 2h after b'fast: n/c - before lunch: 98-120 >> 120-130 >> 175 >> 221 (cantaloupe) - 2h after lunch: n/c >> 200 - before dinner: n/c >> 130-144 >> 135-159, 235, 300s >> 119, 130-151 - 2h after dinner: n/c >> 189 - bedtime: 120s >> 125-145 >> n/c >> 192, 265 - nighttime: n/c No lows. Lowest sugar was 97 >> 90 >> 75 >> 94; she has hypoglycemia awareness at 70.  Highest sugar was 228 >> 287 >> 300x1 >> 265.  Glucometer: AccuChek  Pt's meals are: - Breakfast: coffee, special K cereals; sometimes bacon + egg, toast; pancakes - Lunch: sandwich; hamburger, salad - Dinner: - Snacks: 2: fresh fruit, dried fruit, icecream  - + stage 2 CKD, last BUN/creatinine:  Lab Results  Component Value Date   BUN 26 (H) 12/29/2014   CREATININE 1.20  12/29/2014  On Olmesartan. - last set of lipids: Lab Results  Component Value Date   CHOL 124 09/01/2014   HDL 35.30 (L) 09/01/2014   LDLCALC 55 09/01/2014   TRIG 171.0 (H) 09/01/2014   CHOLHDL 4 09/01/2014  On Pitavastatin. - last eye exam was in 04/27/2015. No DR.  - no numbness and tingling in her feet.  ROS: Constitutional: no weight loss/gain, no fatigue, no subjective hyperthermia/hypothermia, + nocturia  Eyes: no blurry vision, no xerophthalmia ENT: no sore throat, no nodules palpated in throat, no dysphagia/odynophagia, no hoarseness Cardiovascular: no CP/SOB/palpitations/leg swelling Respiratory: no cough/SOB Gastrointestinal: no N/V/D/C Musculoskeletal: no muscle/joint aches Skin: no rashes Neurological: no tremors/numbness/tingling/dizziness  I reviewed pt's medications, allergies, PMH, social hx, family hx, and changes were documented in the history of present illness. Otherwise, unchanged from my initial visit note.  Past Medical History:  Diagnosis Date  . Diabetes mellitus   . Hypertension    Past Surgical History:  Procedure Laterality Date  . BREAST SURGERY     Social History   Social History  . Marital Status: Married    Spouse Name: N/A  . Number of Children: 1   Occupational History  . 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 Smoking status: Never Smoker   . Smokeless tobacco: Not on file  .  Alcohol Use: No  . Drug Use: No   Current Outpatient Prescriptions on File Prior to Visit  Medication Sig Dispense Refill  . aspirin EC 81 MG tablet Take 81 mg by mouth daily.    . diclofenac sodium (VOLTAREN) 1 % GEL Apply 2 g topically 4 (four) times daily. 100 g 3  . esomeprazole (NEXIUM) 40 MG capsule Take 40 mg by mouth daily before breakfast.    . glipiZIDE (GLUCOTROL) 5 MG tablet Take 1 tablet (5 mg total) by mouth 2 (two) times daily before a meal. 180 tablet 1  . Insulin Detemir (LEVEMIR) 100 UNIT/ML Pen Inject 35 Units into the skin  daily at 10 pm. 45 mL 1  . Liraglutide (VICTOZA) 18 MG/3ML SOPN Inject 0.3 mLs (1.8 mg total) into the skin daily before breakfast. 9 pen 3  . metFORMIN (GLUCOPHAGE) 1000 MG tablet Take 1 tablet (1,000 mg total) by mouth daily with supper. 90 tablet 1  . Olmesartan-Amlodipine-HCTZ (TRIBENZOR) 40-10-25 MG TABS Take 1 tablet by mouth daily. 90 tablet 3  . spironolactone (ALDACTONE) 25 MG tablet Take 1 tablet (25 mg total) by mouth daily. 90 tablet 1   No current facility-administered medications on file prior to visit.    Allergies  Allergen Reactions  . Tramadol Nausea And Vomiting     FH - see HPI  PE: BP 120/70   Pulse 80   Wt 226 lb (102.5 kg)   BMI 37.61 kg/m  Wt Readings from Last 3 Encounters:  07/29/15 226 lb (102.5 kg)  07/16/15 225 lb 6.4 oz (102.2 kg)  07/16/15 225 lb (102.1 kg)   Constitutional: overweight, in NAD Eyes: PERRLA, EOMI, no exophthalmos ENT: moist mucous membranes, no thyromegaly, no cervical lymphadenopathy Cardiovascular: RRR, No MRG Respiratory: CTA B Gastrointestinal: abdomen soft, NT, ND, BS+ Musculoskeletal: no deformities, strength intact in all 4 Skin: moist, warm, no rashes Neurological: no tremor with outstretched hands, DTR normal in all 4  ASSESSMENT: 1. DM2, insulin-dependent, uncontrolled, with complications - PN - CKD stage 2  PLAN:  1. Patient with long-standing, uncontrolled diabetes, on half-max dose Metformin at bedtime + basal insulin + Victoza. Sugars better after we added Glipizide and seeing the nutritionist. Sugars still high at night, but improving >> will not change regimen. She feels her appt with dietitian helped her  - 1 week ago >> started dietary changes. - I suggested to:  Patient Instructions  Please continue  - Levemir 35 units after dinner - Metformin 1000 mg with dinner - Victoza 1.8 mg daily in am - Glipizide 5 mg 2x a day before b'fast and dinner.   Please come back for a follow-up appointment in 3  months.   - restart checking sugars at different times of the day - check 2 times a day, rotating checks - advised for yearly eye exams >> she is UTD.  - checked HbA1c today >> 8.4% (better) - Return to clinic in 3 mo with sugar log

## 2015-10-07 ENCOUNTER — Other Ambulatory Visit: Payer: Self-pay | Admitting: Internal Medicine

## 2015-10-20 ENCOUNTER — Other Ambulatory Visit: Payer: Self-pay | Admitting: Internal Medicine

## 2015-10-20 ENCOUNTER — Other Ambulatory Visit (INDEPENDENT_AMBULATORY_CARE_PROVIDER_SITE_OTHER): Payer: BLUE CROSS/BLUE SHIELD

## 2015-10-20 ENCOUNTER — Encounter: Payer: Self-pay | Admitting: Internal Medicine

## 2015-10-20 ENCOUNTER — Ambulatory Visit (INDEPENDENT_AMBULATORY_CARE_PROVIDER_SITE_OTHER): Payer: BLUE CROSS/BLUE SHIELD | Admitting: Internal Medicine

## 2015-10-20 VITALS — BP 136/78 | HR 90 | Temp 98.4°F | Resp 18 | Ht 64.0 in | Wt 227.0 lb

## 2015-10-20 DIAGNOSIS — Z794 Long term (current) use of insulin: Secondary | ICD-10-CM

## 2015-10-20 DIAGNOSIS — I10 Essential (primary) hypertension: Secondary | ICD-10-CM

## 2015-10-20 DIAGNOSIS — IMO0002 Reserved for concepts with insufficient information to code with codable children: Secondary | ICD-10-CM

## 2015-10-20 DIAGNOSIS — E1142 Type 2 diabetes mellitus with diabetic polyneuropathy: Secondary | ICD-10-CM

## 2015-10-20 DIAGNOSIS — Z23 Encounter for immunization: Secondary | ICD-10-CM | POA: Diagnosis not present

## 2015-10-20 DIAGNOSIS — E875 Hyperkalemia: Secondary | ICD-10-CM

## 2015-10-20 DIAGNOSIS — E1165 Type 2 diabetes mellitus with hyperglycemia: Secondary | ICD-10-CM | POA: Diagnosis not present

## 2015-10-20 DIAGNOSIS — Z0000 Encounter for general adult medical examination without abnormal findings: Secondary | ICD-10-CM

## 2015-10-20 DIAGNOSIS — E785 Hyperlipidemia, unspecified: Secondary | ICD-10-CM | POA: Diagnosis not present

## 2015-10-20 LAB — COMPREHENSIVE METABOLIC PANEL
ALBUMIN: 4.2 g/dL (ref 3.5–5.2)
ALK PHOS: 67 U/L (ref 39–117)
ALT: 11 U/L (ref 0–35)
AST: 13 U/L (ref 0–37)
BUN: 28 mg/dL — AB (ref 6–23)
CO2: 26 mEq/L (ref 19–32)
CREATININE: 1.4 mg/dL — AB (ref 0.40–1.20)
Calcium: 9.8 mg/dL (ref 8.4–10.5)
Chloride: 105 mEq/L (ref 96–112)
GFR: 48.05 mL/min — ABNORMAL LOW (ref >60.00)
Glucose, Bld: 119 mg/dL — ABNORMAL HIGH (ref 70–99)
POTASSIUM: 5.5 meq/L — AB (ref 3.5–5.1)
SODIUM: 139 meq/L (ref 135–145)
TOTAL PROTEIN: 8 g/dL (ref 6.0–8.3)
Total Bilirubin: 0.2 mg/dL (ref 0.2–1.2)

## 2015-10-20 LAB — LIPID PANEL
Cholesterol: 248 mg/dL — ABNORMAL HIGH (ref 0–200)
HDL: 38.7 mg/dL — ABNORMAL LOW (ref >39.00)
NonHDL: 208.8
Total CHOL/HDL Ratio: 6
Triglycerides: 222 mg/dL — ABNORMAL HIGH (ref 0.0–149.0)
VLDL: 44.4 mg/dL — ABNORMAL HIGH (ref 0.0–40.0)

## 2015-10-20 LAB — LDL CHOLESTEROL, DIRECT: LDL DIRECT: 161 mg/dL

## 2015-10-20 LAB — HEMOGLOBIN A1C: HEMOGLOBIN A1C: 8.3 % — AB (ref 4.6–6.5)

## 2015-10-20 MED ORDER — SPIRONOLACTONE 25 MG PO TABS: 25.0000 mg | ORAL_TABLET | Freq: Every day | ORAL | 3 refills | Status: DC

## 2015-10-20 NOTE — Assessment & Plan Note (Signed)
Checking labs today, given flu shot. Declines tetanus shot. Admits to having colonoscopy recently with Dr. Loreta Ave and signed to get those records. Shingles is due. Foot exam done today. Reminded about mammogram. Counseled about sun safety and mole surveillance. Given screening recommendations.

## 2015-10-20 NOTE — Progress Notes (Signed)
   Subjective:    Patient ID: Donna Booker, female    DOB: 16-Jun-1945, 70 y.o.   MRN: [id number]  HPI The patient is a 70 YO female coming in for wellness.  Dr. Loreta Ave for colonoscopy.   Review of Systems  Constitutional: Negative for activity change, appetite change, fatigue and unexpected weight change.  HENT: Negative.   Eyes: Negative.   Respiratory: Negative for cough, chest tightness, shortness of breath and wheezing.   Cardiovascular: Negative for chest pain, palpitations and leg swelling.  Gastrointestinal: Negative for abdominal distention, abdominal pain, blood in stool, constipation, diarrhea and nausea.  Musculoskeletal: Negative.   Skin: Negative.   Neurological: Negative.   Psychiatric/Behavioral: Negative.       Objective:   Physical Exam  Constitutional: She is oriented to person, place, and time. She appears well-developed and well-nourished.  HENT:  Head: Normocephalic and atraumatic.  Eyes: EOM are normal.  Neck: Normal range of motion.  Cardiovascular: Normal rate and regular rhythm.   Pulmonary/Chest: Effort normal and breath sounds normal. No respiratory distress. She has no wheezes. She has no rales.  Abdominal: Soft. She exhibits no distension. There is no tenderness. There is no rebound.  Musculoskeletal: She exhibits no edema.  Neurological: She is alert and oriented to person, place, and time.  Skin: Skin is warm and dry.  See foot exam  Psychiatric: She has a normal mood and affect.   Vitals:   10/20/15 0814  BP: 136/78  Pulse: 90  Resp: 18  Temp: 98.4 F (36.9 C)  TempSrc: Oral  SpO2: 96%  Weight: 227 lb (103 kg)  Height: 5\' 4"  (1.626 m)     Assessment & Plan:  Flu shot given at visit.

## 2015-10-20 NOTE — Patient Instructions (Signed)
We are checking the labs today and will call you back with the results.   We have given you a flu shot today.   Health Maintenance, Female Adopting a healthy lifestyle and getting preventive care can go a long way to promote health and wellness. Talk with your health care provider about what schedule of regular examinations is right for you. This is a good chance for you to check in with your provider about disease prevention and staying healthy. In between checkups, there are plenty of things you can do on your own. Experts have done a lot of research about which lifestyle changes and preventive measures are most likely to keep you healthy. Ask your health care provider for more information. WEIGHT AND DIET  Eat a healthy diet  Be sure to include plenty of vegetables, fruits, low-fat dairy products, and lean protein.  Do not eat a lot of foods high in solid fats, added sugars, or salt.  Get regular exercise. This is one of the most important things you can do for your health.  Most adults should exercise for at least 150 minutes each week. The exercise should increase your heart rate and make you sweat (moderate-intensity exercise).  Most adults should also do strengthening exercises at least twice a week. This is in addition to the moderate-intensity exercise.  Maintain a healthy weight  Body mass index (BMI) is a measurement that can be used to identify possible weight problems. It estimates body fat based on height and weight. Your health care provider can help determine your BMI and help you achieve or maintain a healthy weight.  For females 20 years of age and older:   A BMI below 18.5 is considered underweight.  A BMI of 18.5 to 24.9 is normal.  A BMI of 25 to 29.9 is considered overweight.  A BMI of 30 and above is considered obese.  Watch levels of cholesterol and blood lipids  You should start having your blood tested for lipids and cholesterol at 68 years of age, then  have this test every 5 years.  You may need to have your cholesterol levels checked more often if:  Your lipid or cholesterol levels are high.  You are older than 68 years of age.  You are at high risk for heart disease.  CANCER SCREENING   Lung Cancer  Lung cancer screening is recommended for adults 55-80 years old who are at high risk for lung cancer because of a history of smoking.  A yearly low-dose CT scan of the lungs is recommended for people who:  Currently smoke.  Have quit within the past 15 years.  Have at least a 30-pack-year history of smoking. A pack year is smoking an average of one pack of cigarettes a day for 1 year.  Yearly screening should continue until it has been 15 years since you quit.  Yearly screening should stop if you develop a health problem that would prevent you from having lung cancer treatment.  Breast Cancer  Practice breast self-awareness. This means understanding how your breasts normally appear and feel.  It also means doing regular breast self-exams. Let your health care provider know about any changes, no matter how small.  If you are in your 20s or 30s, you should have a clinical breast exam (CBE) by a health care provider every 1-3 years as part of a regular health exam.  If you are 40 or older, have a CBE every year. Also consider having a breast   X-ray (mammogram) every year.  If you have a family history of breast cancer, talk to your health care provider about genetic screening.  If you are at high risk for breast cancer, talk to your health care provider about having an MRI and a mammogram every year.  Breast cancer gene (BRCA) assessment is recommended for women who have family members with BRCA-related cancers. BRCA-related cancers include:  Breast.  Ovarian.  Tubal.  Peritoneal cancers.  Results of the assessment will determine the need for genetic counseling and BRCA1 and BRCA2 testing. Cervical Cancer Your health  care provider may recommend that you be screened regularly for cancer of the pelvic organs (ovaries, uterus, and vagina). This screening involves a pelvic examination, including checking for microscopic changes to the surface of your cervix (Pap test). You may be encouraged to have this screening done every 3 years, beginning at age 21.  For women ages 30-65, health care providers may recommend pelvic exams and Pap testing every 3 years, or they may recommend the Pap and pelvic exam, combined with testing for human papilloma virus (HPV), every 5 years. Some types of HPV increase your risk of cervical cancer. Testing for HPV may also be done on women of any age with unclear Pap test results.  Other health care providers may not recommend any screening for nonpregnant women who are considered low risk for pelvic cancer and who do not have symptoms. Ask your health care provider if a screening pelvic exam is right for you.  If you have had past treatment for cervical cancer or a condition that could lead to cancer, you need Pap tests and screening for cancer for at least 20 years after your treatment. If Pap tests have been discontinued, your risk factors (such as having a new sexual partner) need to be reassessed to determine if screening should resume. Some women have medical problems that increase the chance of getting cervical cancer. In these cases, your health care provider may recommend more frequent screening and Pap tests. Colorectal Cancer  This type of cancer can be detected and often prevented.  Routine colorectal cancer screening usually begins at 68 years of age and continues through 68 years of age.  Your health care provider may recommend screening at an earlier age if you have risk factors for colon cancer.  Your health care provider may also recommend using home test kits to check for hidden blood in the stool.  A small camera at the end of a tube can be used to examine your colon  directly (sigmoidoscopy or colonoscopy). This is done to check for the earliest forms of colorectal cancer.  Routine screening usually begins at age 50.  Direct examination of the colon should be repeated every 5-10 years through 68 years of age. However, you may need to be screened more often if early forms of precancerous polyps or small growths are found. Skin Cancer  Check your skin from head to toe regularly.  Tell your health care provider about any new moles or changes in moles, especially if there is a change in a mole's shape or color.  Also tell your health care provider if you have a mole that is larger than the size of a pencil eraser.  Always use sunscreen. Apply sunscreen liberally and repeatedly throughout the day.  Protect yourself by wearing long sleeves, pants, a wide-brimmed hat, and sunglasses whenever you are outside. HEART DISEASE, DIABETES, AND HIGH BLOOD PRESSURE   High blood pressure causes heart   disease and increases the risk of stroke. High blood pressure is more likely to develop in:  People who have blood pressure in the high end of the normal range (130-139/85-89 mm Hg).  People who are overweight or obese.  People who are African American.  If you are 71-13 years of age, have your blood pressure checked every 3-5 years. If you are 1 years of age or older, have your blood pressure checked every year. You should have your blood pressure measured twice--once when you are at a hospital or clinic, and once when you are not at a hospital or clinic. Record the average of the two measurements. To check your blood pressure when you are not at a hospital or clinic, you can use:  An automated blood pressure machine at a pharmacy.  A home blood pressure monitor.  If you are between 100 years and 73 years old, ask your health care provider if you should take aspirin to prevent strokes.  Have regular diabetes screenings. This involves taking a blood sample to check  your fasting blood sugar level.  If you are at a normal weight and have a low risk for diabetes, have this test once every three years after 68 years of age.  If you are overweight and have a high risk for diabetes, consider being tested at a younger age or more often. PREVENTING INFECTION  Hepatitis B  If you have a higher risk for hepatitis B, you should be screened for this virus. You are considered at high risk for hepatitis B if:  You were born in a country where hepatitis B is common. Ask your health care provider which countries are considered high risk.  Your parents were born in a high-risk country, and you have not been immunized against hepatitis B (hepatitis B vaccine).  You have HIV or AIDS.  You use needles to inject street drugs.  You live with someone who has hepatitis B.  You have had sex with someone who has hepatitis B.  You get hemodialysis treatment.  You take certain medicines for conditions, including cancer, organ transplantation, and autoimmune conditions. Hepatitis C  Blood testing is recommended for:  Everyone born from [birth year] through 1963.  Anyone with known risk factors for hepatitis C. Sexually transmitted infections (STIs)  You should be screened for sexually transmitted infections (STIs) including gonorrhea and chlamydia if:  You are sexually active and are younger than 68 years of age.  You are older than 68 years of age and your health care provider tells you that you are at risk for this type of infection.  Your sexual activity has changed since you were last screened and you are at an increased risk for chlamydia or gonorrhea. Ask your health care provider if you are at risk.  If you do not have HIV, but are at risk, it may be recommended that you take a prescription medicine daily to prevent HIV infection. This is called pre-exposure prophylaxis (PrEP). You are considered at risk if:  You are sexually active and do not regularly use  condoms or know the HIV status of your partner(s).  You take drugs by injection.  You are sexually active with a partner who has HIV. Talk with your health care provider about whether you are at high risk of being infected with HIV. If you choose to begin PrEP, you should first be tested for HIV. You should then be tested every 3 months for as long as you are taking  PrEP.  PREGNANCY   If you are premenopausal and you may become pregnant, ask your health care provider about preconception counseling.  If you may become pregnant, take 400 to 800 micrograms (mcg) of folic acid every day.  If you want to prevent pregnancy, talk to your health care provider about birth control (contraception). OSTEOPOROSIS AND MENOPAUSE   Osteoporosis is a disease in which the bones lose minerals and strength with aging. This can result in serious bone fractures. Your risk for osteoporosis can be identified using a bone density scan.  If you are 65 years of age or older, or if you are at risk for osteoporosis and fractures, ask your health care provider if you should be screened.  Ask your health care provider whether you should take a calcium or vitamin D supplement to lower your risk for osteoporosis.  Menopause may have certain physical symptoms and risks.  Hormone replacement therapy may reduce some of these symptoms and risks. Talk to your health care provider about whether hormone replacement therapy is right for you.  HOME CARE INSTRUCTIONS   Schedule regular health, dental, and eye exams.  Stay current with your immunizations.   Do not use any tobacco products including cigarettes, chewing tobacco, or electronic cigarettes.  If you are pregnant, do not drink alcohol.  If you are breastfeeding, limit how much and how often you drink alcohol.  Limit alcohol intake to no more than 1 drink per day for nonpregnant women. One drink equals 12 ounces of beer, 5 ounces of wine, or 1 ounces of hard  liquor.  Do not use street drugs.  Do not share needles.  Ask your health care provider for help if you need support or information about quitting drugs.  Tell your health care provider if you often feel depressed.  Tell your health care provider if you have ever been abused or do not feel safe at home.   This information is not intended to replace advice given to you by your health care provider. Make sure you discuss any questions you have with your health care provider.   Document Released: 07/04/2010 Document Revised: 01/09/2014 Document Reviewed: 11/20/2012 Elsevier Interactive Patient Education 2016 [employer]  

## 2015-10-20 NOTE — Progress Notes (Signed)
Pre visit review using our clinic review tool, if applicable. No additional management support is needed unless otherwise documented below in the visit note. 

## 2015-10-20 NOTE — Assessment & Plan Note (Signed)
BP at goal on amlodipine/olmesartan/hctz and spironolactone. Checking CMP and adjust as needed. No side effects.

## 2015-10-20 NOTE — Assessment & Plan Note (Addendum)
Checking lipid panel and adjust as needed. Not taking meds right now. Goal LDL <100 (ideal <70).

## 2015-10-20 NOTE — Assessment & Plan Note (Signed)
Weight stable and reminded about the need for regular exercise.

## 2015-10-20 NOTE — Assessment & Plan Note (Addendum)
Sees endo, since we are checking blood work added HgA1c. Taking metformin, victoza, levemir, glipizide.

## 2015-10-27 ENCOUNTER — Telehealth: Payer: Self-pay | Admitting: Geriatric Medicine

## 2015-10-27 NOTE — Telephone Encounter (Signed)
Patient is aware of lab results and will stop spironolactone. She will come in and have her labs drawn again. She is also willing to start a medication for cholesterol as long as it is not crestor. She says she cannot take that.

## 2015-10-28 MED ORDER — PRAVASTATIN SODIUM 40 MG PO TABS: 40.0000 mg | ORAL_TABLET | Freq: Every day | ORAL | 3 refills | Status: DC

## 2015-10-28 NOTE — Telephone Encounter (Signed)
Have sent in pravastatin and removed spironolactone from list.

## 2015-10-28 NOTE — Telephone Encounter (Signed)
Left message on voice mail informing patient that prevastatin has been sent in.

## 2015-10-28 NOTE — Addendum Note (Signed)
Addended by: Hillard Danker A on: 10/28/2015 03:42 PM   Modules accepted: Orders

## 2015-10-29 ENCOUNTER — Ambulatory Visit (INDEPENDENT_AMBULATORY_CARE_PROVIDER_SITE_OTHER): Payer: BLUE CROSS/BLUE SHIELD | Admitting: Internal Medicine

## 2015-10-29 ENCOUNTER — Encounter: Payer: Self-pay | Admitting: Internal Medicine

## 2015-10-29 VITALS — BP 134/84 | HR 91 | Wt 230.0 lb

## 2015-10-29 DIAGNOSIS — IMO0002 Reserved for concepts with insufficient information to code with codable children: Secondary | ICD-10-CM

## 2015-10-29 DIAGNOSIS — E1165 Type 2 diabetes mellitus with hyperglycemia: Secondary | ICD-10-CM | POA: Diagnosis not present

## 2015-10-29 DIAGNOSIS — Z23 Encounter for immunization: Secondary | ICD-10-CM | POA: Diagnosis not present

## 2015-10-29 DIAGNOSIS — E1142 Type 2 diabetes mellitus with diabetic polyneuropathy: Secondary | ICD-10-CM | POA: Diagnosis not present

## 2015-10-29 NOTE — Progress Notes (Signed)
Patient ID: Donna Booker, female   DOB: [birth year], 70 y.o.   MRN: [id number]  HPI: Donna Booker is a 70 y.o.-year-old female, returning for f/u for DM2, dx in 1987, insulin-dependent since 2006, uncontrolled, with complications (PN, stage 2 CKD). Last visit 3 mo ago.  She had 2 steroid inj since last visit for gout.  Last hemoglobin A1c was: Lab Results  Component Value Date   HGBA1C 8.3 (H) 10/20/2015   HGBA1C 8.4 07/29/2015   HGBA1C 10.0 04/27/2015   Pt wason a regimen of: - Metformin 1000 mg in am - VGo 30 - initially with 1-2 meal clicks, now off these - started 2015 She was on Lantus 30, but taken off to start the VGo. She was on Victoza. She tried Bydureon (02/2013-10/2014) >> made her hungry, did not lower sugars. She also tried Trulicity 1.5 mg (10/2013-04/2014) >> did not lower her sugars.  She is now: - Levemir 35 units after dinner - Victoza 1.8 mg daily in am - Metformin 1000 mg with dinner - Glipizide 5 mg 2x a day before b'fast and dinner.   Pt checked her sugars 2x a day. Sugars are better - but did not check in last week: - am: 147-160 >> 90, 120-152, 287 (forgot insulin) >> 94, 125-227 >> 99-138, 161 >> 94-128 - 2h after b'fast: n/c - before lunch: 98-120 >> 120-130 >> 175 >> 221 (cantaloupe) >> n/c - 2h after lunch: n/c >> 200 >> n/c - before dinner: n/c >> 130-144 >> 135-159, 235, 300s >> 119, 130-151 >> 120-125 - 2h after dinner: n/c >> 189 >> n/c - bedtime: 120s >> 125-145 >> n/c >> 192, 265 >> 130-137 - nighttime: n/c No lows. Lowest sugar was 97 >> 90 >> 75 >> 94 >> 94; she has hypoglycemia awareness at 70.  Highest sugar was 228 >> 287 >> 300x1 >> 265 >> 298.  Glucometer: AccuChek  Pt's meals are: - Breakfast: coffee, special K cereals; sometimes bacon + egg, toast; pancakes - Lunch: sandwich; hamburger, salad - Dinner: - Snacks: 2: fresh fruit, dried fruit, icecream  - + stage 2 CKD, last BUN/creatinine:  Lab Results  Component Value Date    BUN 28 (H) 10/20/2015   CREATININE 1.40 (H) 10/20/2015  On Olmesartan. - last set of lipids: Lab Results  Component Value Date   CHOL 248 (H) 10/20/2015   HDL 38.70 (L) 10/20/2015   LDLCALC 55 09/01/2014   LDLDIRECT 161.0 10/20/2015   TRIG 222.0 (H) 10/20/2015   CHOLHDL 6 10/20/2015  On Pravastatin. - last eye exam was in 04/27/2015. No DR.  - no numbness and tingling in her feet.  ROS: Constitutional: no weight loss/gain, no fatigue, no subjective hyperthermia/hypothermia, + nocturia  Eyes: no blurry vision, no xerophthalmia ENT: no sore throat, no nodules palpated in throat, no dysphagia/odynophagia, no hoarseness Cardiovascular: no CP/SOB/palpitations/leg swelling Respiratory: no cough/SOB Gastrointestinal: no N/V/D/C Musculoskeletal: no muscle/joint aches Skin: no rashes Neurological: no tremors/numbness/tingling/dizziness  I reviewed pt's medications, allergies, PMH, social hx, family hx, and changes were documented in the history of present illness. Otherwise, unchanged from my initial visit note.  Past Medical History:  Diagnosis Date  . Diabetes mellitus   . Hypertension    Past Surgical History:  Procedure Laterality Date  . BREAST SURGERY     Social History   Social History  . Marital Status: Married    Spouse Name: N/A  . Number of Children: 1   Occupational History  . Customer  svc rep   Social History Main Topics  . Smoking status: Never Smoker   . Smokeless tobacco: Not on file  . Alcohol Use: No  . Drug Use: No   Current Outpatient Prescriptions on File Prior to Visit  Medication Sig Dispense Refill  . aspirin EC 81 MG tablet Take 81 mg by mouth daily.    . diclofenac sodium (VOLTAREN) 1 % GEL Apply 2 g topically 4 (four) times daily. 100 g 3  . esomeprazole (NEXIUM) 40 MG capsule Take 40 mg by mouth daily before breakfast.    . glipiZIDE (GLUCOTROL) 5 MG tablet Take 1 tablet (5 mg total) by mouth 2 (two) times daily before a meal. 180  tablet 1  . Insulin Detemir (LEVEMIR) 100 UNIT/ML Pen Inject 35 Units into the skin daily at 10 pm. 45 mL 1  . Liraglutide (VICTOZA) 18 MG/3ML SOPN Inject 0.3 mLs (1.8 mg total) into the skin daily before breakfast. 9 pen 3  . metFORMIN (GLUCOPHAGE) 1000 MG tablet TAKE 1 TABLET DAILY WITH SUPPER 90 tablet 1  . Olmesartan-Amlodipine-HCTZ (TRIBENZOR) 40-10-25 MG TABS Take 1 tablet by mouth daily. (Patient not taking: Reported on 10/29/2015) 90 tablet 3  . pravastatin (PRAVACHOL) 40 MG tablet Take 1 tablet (40 mg total) by mouth daily. (Patient not taking: Reported on 10/29/2015) 90 tablet 3   No current facility-administered medications on file prior to visit.    Allergies  Allergen Reactions  . Tramadol Nausea And Vomiting    FH - see HPI  PE: BP 134/84 (BP Location: Left Arm, Patient Position: Sitting)   Pulse 91   Wt 230 lb (104.3 kg)   SpO2 98%   BMI 39.48 kg/m  Wt Readings from Last 3 Encounters:  10/29/15 230 lb (104.3 kg)  10/20/15 227 lb (103 kg)  07/29/15 226 lb (102.5 kg)   Constitutional: overweight, in NAD Eyes: PERRLA, EOMI, no exophthalmos ENT: moist mucous membranes, no thyromegaly, no cervical lymphadenopathy Cardiovascular: RRR, No MRG Respiratory: CTA B Gastrointestinal: abdomen soft, NT, ND, BS+ Musculoskeletal: no deformities, strength intact in all 4 Skin: moist, warm, no rashes Neurological: no tremor with outstretched hands, DTR normal in all 4  ASSESSMENT: 1. DM2, insulin-dependent, uncontrolled, with complications - PN - CKD stage 2  PLAN:  1. Patient with long-standing, uncontrolled diabetes, on half-max dose Metformin at bedtime + basal insulin + Victoza. Sugars better after we added Glipizide and seeing the nutritionist. She feels her appt with dietitian helped her, however, she started back to eat candy and a lot of meat and fried food. We discussed that she absolutely needs to stop eating concentrated sweets and fried food (her LDL has also  increased) and reduce the meat. OTW, based on her sugars checked at home, which appear better >> will not change regimen. - I suggested to:  Patient Instructions  Please continue  - Levemir 35 units after dinner - Metformin 1000 mg with dinner - Victoza 1.8 mg daily in am - Glipizide 5 mg 2x a day before b'fast and dinner.   Please come back for a follow-up appointment in 3 months.  - restart checking sugars at different times of the day - check 2 times a day, rotating checks - advised for yearly eye exams >> she is UTD.  - will give her the PNA vaccine today - Return to clinic in 3 mo with sugar log   Carlus Pavlov, MD PhD [facility]

## 2015-10-29 NOTE — Patient Instructions (Signed)
Please continue  - Levemir 35 units after dinner - Metformin 1000 mg with dinner - Victoza 1.8 mg daily in am - Glipizide 5 mg 2x a day before b'fast and dinner.   Please come back for a follow-up appointment in 3 months. 

## 2016-01-07 ENCOUNTER — Other Ambulatory Visit: Payer: Self-pay

## 2016-01-07 ENCOUNTER — Telehealth: Payer: Self-pay | Admitting: Internal Medicine

## 2016-01-07 MED ORDER — METFORMIN HCL 1000 MG PO TABS: ORAL_TABLET | ORAL | 0 refills | Status: DC

## 2016-01-07 MED ORDER — METFORMIN HCL 1000 MG PO TABS: ORAL_TABLET | ORAL | 1 refills | Status: DC

## 2016-01-07 NOTE — Telephone Encounter (Signed)
Patient need refill of  metFORMIN (GLUCOPHAGE) 1000 MG tablet  [facility]

## 2016-01-14 ENCOUNTER — Other Ambulatory Visit: Payer: Self-pay

## 2016-01-14 MED ORDER — INSULIN DETEMIR 100 UNIT/ML FLEXPEN: 35.0000 [IU] | PEN_INJECTOR | Freq: Every day | SUBCUTANEOUS | 1 refills | Status: DC

## 2016-01-14 NOTE — Telephone Encounter (Signed)
Pharmacy haven't received request for medication Insulin Detemir (LEVEMIR) 100 UNIT/ML Pen  Please resubmit  [facility] [place], [place] - [address] AT [address] to [facility] [employer] [phone] (Phone) [phone] (Fax)

## 2016-01-14 NOTE — Telephone Encounter (Signed)
Resubmitted rx

## 2016-02-01 ENCOUNTER — Encounter: Payer: Self-pay | Admitting: Internal Medicine

## 2016-02-01 ENCOUNTER — Ambulatory Visit (INDEPENDENT_AMBULATORY_CARE_PROVIDER_SITE_OTHER): Payer: BLUE CROSS/BLUE SHIELD | Admitting: Internal Medicine

## 2016-02-01 VITALS — BP 122/82 | HR 87 | Ht 64.0 in | Wt 229.0 lb

## 2016-02-01 DIAGNOSIS — Z794 Long term (current) use of insulin: Secondary | ICD-10-CM | POA: Diagnosis not present

## 2016-02-01 DIAGNOSIS — E1165 Type 2 diabetes mellitus with hyperglycemia: Secondary | ICD-10-CM | POA: Diagnosis not present

## 2016-02-01 DIAGNOSIS — E1142 Type 2 diabetes mellitus with diabetic polyneuropathy: Secondary | ICD-10-CM | POA: Diagnosis not present

## 2016-02-01 DIAGNOSIS — IMO0002 Reserved for concepts with insufficient information to code with codable children: Secondary | ICD-10-CM

## 2016-02-01 DIAGNOSIS — G6289 Other specified polyneuropathies: Secondary | ICD-10-CM

## 2016-02-01 LAB — POCT GLYCOSYLATED HEMOGLOBIN (HGB A1C): HEMOGLOBIN A1C: 8

## 2016-02-01 NOTE — Progress Notes (Signed)
Patient ID: Donna Booker, female   DOB: [birth year], 71 y.o.   MRN: [id number]  HPI: Donna Booker is a 71 y.o.-year-old female, returning for f/u for DM2, dx in 1987, insulin-dependent since 2006, uncontrolled, with complications (PN, stage 2 CKD). Last visit 3 mo ago.  Her brother just died 2 weeks ago. He was her last sibling.  She had a URI this winter.  Last hemoglobin A1c was: Lab Results  Component Value Date   HGBA1C 8.3 (H) 10/20/2015   HGBA1C 8.4 07/29/2015   HGBA1C 10.0 04/27/2015   Pt wason a regimen of: - Metformin 1000 mg in am - VGo 30 - initially with 1-2 meal clicks, now off these - started 2015 She was on Lantus 30, but taken off to start the VGo. She was on Victoza. She tried Bydureon (02/2013-10/2014) >> made her hungry, did not lower sugars. She also tried Trulicity 1.5 mg (10/2013-04/2014) >> did not lower her sugars.  She is now: - Levemir 35 units after dinner - Victoza 1.8 mg daily in am - Metformin 1000 mg with dinner - Glipizide 5 mg 2x a day before b'fast and dinner.   Pt did not check her sugars since last visit ... Stress at home. From last visit: - am: 147-160 >> 90, 120-152, 287 (forgot insulin) >> 94, 125-227 >> 99-138, 161 >> 94-128 - 2h after b'fast: n/c - before lunch: 98-120 >> 120-130 >> 175 >> 221 (cantaloupe) >> n/c - 2h after lunch: n/c >> 200 >> n/c - before dinner: n/c >> 130-144 >> 135-159, 235, 300s >> 119, 130-151 >> 120-125 - 2h after dinner: n/c >> 189 >> n/c - bedtime: 120s >> 125-145 >> n/c >> 192, 265 >> 130-137 - nighttime: n/c No lows. Lowest sugar was 97 >> 90 >> 75 >> 94 >> 94 >> ?; she has hypoglycemia awareness at 70.  Highest sugar was 228 >> 287 >> 300x1 >> 265 >> 298 ?  Glucometer: AccuChek  - + stage 2 CKD, last BUN/creatinine:  Lab Results  Component Value Date   BUN 28 (H) 10/20/2015   CREATININE 1.40 (H) 10/20/2015  On Olmesartan. - last set of lipids: Lab Results  Component Value Date   CHOL 248 (H)  10/20/2015   HDL 38.70 (L) 10/20/2015   LDLCALC 55 09/01/2014   LDLDIRECT 161.0 10/20/2015   TRIG 222.0 (H) 10/20/2015   CHOLHDL 6 10/20/2015  On Pravastatin. - last eye exam was in 04/27/2015. No DR.  - no numbness and tingling in her feet. + feet are burning.  ROS: Constitutional: no weight loss/gain, no fatigue, no subjective hyperthermia/hypothermia, no nocturia  Eyes: no blurry vision, no xerophthalmia ENT: no sore throat, no nodules palpated in throat, no dysphagia/odynophagia, no hoarseness Cardiovascular: no CP/SOB/palpitations/leg swelling Respiratory: no cough/SOB Gastrointestinal: no N/V/D/C Musculoskeletal: no muscle/joint aches Skin: no rashes Neurological: no tremors/numbness/tingling/dizziness, + burning in feet at night  I reviewed pt's medications, allergies, PMH, social hx, family hx, and changes were documented in the history of present illness. Otherwise, unchanged from my initial visit note.  Past Medical History:  Diagnosis Date  . Diabetes mellitus   . Hypertension    Past Surgical History:  Procedure Laterality Date  . BREAST SURGERY     Social History   Social History  . Marital Status: Married    Spouse Name: N/A  . Number of Children: 1   Occupational History  . 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 Smoking  status: Never Smoker   . Smokeless tobacco: Not on file  . Alcohol Use: No  . Drug Use: No   Current Outpatient Prescriptions on File Prior to Visit  Medication Sig Dispense Refill  . aspirin EC 81 MG tablet Take 81 mg by mouth daily.    . diclofenac sodium (VOLTAREN) 1 % GEL Apply 2 g topically 4 (four) times daily. 100 g 3  . esomeprazole (NEXIUM) 40 MG capsule Take 40 mg by mouth daily before breakfast.    . glipiZIDE (GLUCOTROL) 5 MG tablet Take 1 tablet (5 mg total) by mouth 2 (two) times daily before a meal. 180 tablet 1  . Insulin Detemir (LEVEMIR) 100 UNIT/ML Pen Inject 35 Units into the skin daily at 10 pm. 45 mL  1  . Liraglutide (VICTOZA) 18 MG/3ML SOPN Inject 0.3 mLs (1.8 mg total) into the skin daily before breakfast. 9 pen 3  . metFORMIN (GLUCOPHAGE) 1000 MG tablet TAKE 1 TABLET DAILY WITH SUPPER 90 tablet 1  . pravastatin (PRAVACHOL) 40 MG tablet Take 1 tablet (40 mg total) by mouth daily. 90 tablet 3  . Olmesartan-Amlodipine-HCTZ (TRIBENZOR) 40-10-25 MG TABS Take 1 tablet by mouth daily. (Patient not taking: Reported on 10/29/2015) 90 tablet 3   No current facility-administered medications on file prior to visit.    Allergies  Allergen Reactions  . Tramadol Nausea And Vomiting    FH - see HPI  PE: BP 122/82 (BP Location: Left Arm, Patient Position: Sitting)   Pulse 87   Ht 5\' 4"  (1.626 m)   Wt 229 lb (103.9 kg)   SpO2 98%   BMI 39.31 kg/m  Wt Readings from Last 3 Encounters:  02/01/16 229 lb (103.9 kg)  10/29/15 230 lb (104.3 kg)  10/20/15 227 lb (103 kg)   Constitutional: overweight, in NAD Eyes: PERRLA, EOMI, no exophthalmos ENT: moist mucous membranes, no thyromegaly, no cervical lymphadenopathy Cardiovascular: RRR, No MRG Respiratory: CTA B Gastrointestinal: abdomen soft, NT, ND, BS+ Musculoskeletal: no deformities, strength intact in all 4 Skin: moist, warm, no rashes Neurological: no tremor with outstretched hands, DTR normal in all 4  ASSESSMENT: 1. DM2, insulin-dependent, uncontrolled, with complications - PN - CKD stage 2  2. PN  PLAN:  1. Patient with long-standing, uncontrolled diabetes, on half-max dose Metformin at bedtime + basal insulin + Victoza. Sugars better after we added Glipizide and seeing the nutritionist, but she did not check recently as she had a lot of stress at home.  - HbA1c today is slightly better: 8.0% - I suggested to:  Patient Instructions  Please continue  - Levemir 35 units after dinner - Metformin 1000 mg with dinner - Victoza 1.8 mg daily in am - Glipizide 5 mg 2x a day before b'fast and dinner.   Please start alpha-lipoic  acid 600 mg 2x a day.  Please stop at the lab.  Please come back for a follow-up appointment in 3 months.  - restart checking sugars at different times of the day - check 2 times a day, rotating checks - advised for yearly eye exams >> she is UTD.  - Return to clinic in 3 mo with sugar log   2. PN - will check a B12 vitamin as she is on Metformin - I suggested to start ALA 600 mg 2x a day  Component     Latest Ref Rng & Units 02/01/2016  Hemoglobin A1C      8.0  Vitamin B12     211 -  911 pg/mL 625   Vitamin B12 is normal >> I would suggest she starts a MVI along with the ALA.  Carlus Pavlov, MD PhD [facility] Endocrinology

## 2016-02-01 NOTE — Patient Instructions (Addendum)
Please continue  - Levemir 35 units after dinner - Metformin 1000 mg with dinner - Victoza 1.8 mg daily in am - Glipizide 5 mg 2x a day before b'fast and dinner.   Please start alpha-lipoic acid 600 mg 2x a day.  Please stop at the lab.  Please come back for a follow-up appointment in 3 months.

## 2016-02-02 LAB — VITAMIN B12: Vitamin B-12: 625 pg/mL (ref 211–911)

## 2016-02-04 ENCOUNTER — Telehealth: Payer: Self-pay

## 2016-02-04 NOTE — Telephone Encounter (Signed)
-----   Message from Carlus Pavlov, MD sent at 02/02/2016 12:12 PM EST ----- Raynelle Fanning, can you please call pt: Vitamin B12 is normal >> I would suggest she starts a MVI along with the alpha-lipoic acid I suggested at last visit, but no extra vitamin B12 needed for now.

## 2016-02-04 NOTE — Telephone Encounter (Signed)
Call and LVM for patient to call back to receive lab work.

## 2016-02-07 ENCOUNTER — Telehealth: Payer: Self-pay

## 2016-02-07 ENCOUNTER — Telehealth: Payer: Self-pay | Admitting: Internal Medicine

## 2016-02-07 MED ORDER — GLIPIZIDE 5 MG PO TABS: 5.0000 mg | ORAL_TABLET | Freq: Two times a day (BID) | ORAL | 1 refills | Status: DC

## 2016-02-07 NOTE — Telephone Encounter (Signed)
Patient is returning your call, she ask you to leave a detailed message on her phone if she cant be reached.

## 2016-02-07 NOTE — Telephone Encounter (Signed)
Called and LVM advising of lab results, and what to do about medications. Gave call back number if any questions.

## 2016-02-07 NOTE — Telephone Encounter (Signed)
Glipizide refill

## 2016-02-07 NOTE — Telephone Encounter (Signed)
Called and LVM advising of lab results, and what to do about medications. Gave call back number if any questions.  

## 2016-02-14 ENCOUNTER — Telehealth: Payer: Self-pay | Admitting: *Deleted

## 2016-02-14 NOTE — Telephone Encounter (Signed)
Rec'd call pt states [facility] inform her that they have been trying to get refill on her Glipizide. Inform pt per chart rx was sent to [facility] on 02/07/16. Inform will call mail service to check status. Called [facility] spoke w/Jennifer she states that they did received script but the pt has a note on chart " Not to send any meds unless patient call to ok to send". Called pt back no answer LMOM w/ response...Raechel Chute

## 2016-03-03 ENCOUNTER — Other Ambulatory Visit (INDEPENDENT_AMBULATORY_CARE_PROVIDER_SITE_OTHER): Payer: BLUE CROSS/BLUE SHIELD

## 2016-03-03 ENCOUNTER — Encounter: Payer: Self-pay | Admitting: Internal Medicine

## 2016-03-03 ENCOUNTER — Ambulatory Visit (INDEPENDENT_AMBULATORY_CARE_PROVIDER_SITE_OTHER): Payer: BLUE CROSS/BLUE SHIELD | Admitting: Internal Medicine

## 2016-03-03 VITALS — BP 138/90 | HR 95 | Temp 98.0°F | Ht 64.0 in | Wt 228.0 lb

## 2016-03-03 DIAGNOSIS — E1165 Type 2 diabetes mellitus with hyperglycemia: Secondary | ICD-10-CM | POA: Diagnosis not present

## 2016-03-03 DIAGNOSIS — I10 Essential (primary) hypertension: Secondary | ICD-10-CM | POA: Diagnosis not present

## 2016-03-03 DIAGNOSIS — Z794 Long term (current) use of insulin: Secondary | ICD-10-CM

## 2016-03-03 DIAGNOSIS — IMO0002 Reserved for concepts with insufficient information to code with codable children: Secondary | ICD-10-CM

## 2016-03-03 DIAGNOSIS — E1142 Type 2 diabetes mellitus with diabetic polyneuropathy: Secondary | ICD-10-CM | POA: Diagnosis not present

## 2016-03-03 LAB — COMPREHENSIVE METABOLIC PANEL
ALBUMIN: 3.8 g/dL (ref 3.5–5.2)
ALK PHOS: 72 U/L (ref 39–117)
ALT: 13 U/L (ref 0–35)
AST: 12 U/L (ref 0–37)
BUN: 20 mg/dL (ref 6–23)
CALCIUM: 9.2 mg/dL (ref 8.4–10.5)
CHLORIDE: 99 meq/L (ref 96–112)
CO2: 28 mEq/L (ref 19–32)
Creatinine, Ser: 1.16 mg/dL (ref 0.40–1.20)
GFR: 59.63 mL/min — AB (ref >60.00)
Glucose, Bld: 291 mg/dL — ABNORMAL HIGH (ref 70–99)
POTASSIUM: 4.5 meq/L (ref 3.5–5.1)
SODIUM: 134 meq/L — AB (ref 135–145)
TOTAL PROTEIN: 7.3 g/dL (ref 6.0–8.3)
Total Bilirubin: 0.2 mg/dL (ref 0.2–1.2)

## 2016-03-03 MED ORDER — GLIPIZIDE 5 MG PO TABS: 5.0000 mg | ORAL_TABLET | Freq: Two times a day (BID) | ORAL | 0 refills | Status: DC

## 2016-03-03 MED ORDER — GABAPENTIN 100 MG PO CAPS: 100.0000 mg | ORAL_CAPSULE | Freq: Every day | ORAL | 0 refills | Status: DC

## 2016-03-03 NOTE — Progress Notes (Signed)
Pre visit review using our clinic review tool, if applicable. No additional management support is needed unless otherwise documented below in the visit note. 

## 2016-03-03 NOTE — Assessment & Plan Note (Signed)
With worsening neuropathy, wants to try gabapentin 100-200 mg at night time. Also advised to try capsaicin cream over the counter or aspercreme for the pain.

## 2016-03-03 NOTE — Patient Instructions (Addendum)
We have sent in the gabapentin for night time. You can try 1 pill at night time and increase to 2 pills at night time if needed.   It is also okay to try capsaicin cream as well for the pain.  We did the physical in October 2017 last time.

## 2016-03-03 NOTE — Assessment & Plan Note (Signed)
Checking CMP for stability of her CKD given that she did not get this drawn last visit while she was here.

## 2016-03-03 NOTE — Progress Notes (Signed)
   Subjective:    Patient ID: Donna Booker, female    DOB: [birth year], 71 y.o.   MRN: [id number]  HPI The patient is a 71 YO female coming in for acute visit for burning pains in her feet at night time. She has noticed it over the last several months but worsening in the last week or two. Most nights she has warm sensation in her feet as well as some burning pains. She denies color change or rash. She denies injury to her feet. No sores or cuts.   Review of Systems  Constitutional: Negative.   Respiratory: Negative.   Cardiovascular: Negative.   Gastrointestinal: Negative.   Musculoskeletal: Positive for myalgias. Negative for arthralgias, back pain, gait problem, joint swelling, neck pain and neck stiffness.  Skin: Negative.   Neurological: Positive for numbness. Negative for dizziness, seizures, syncope, facial asymmetry, speech difficulty, weakness and headaches.  Psychiatric/Behavioral: Negative.       Objective:   Physical Exam  Constitutional: She is oriented to person, place, and time. She appears well-developed and well-nourished.  HENT:  Head: Normocephalic and atraumatic.  Eyes: EOM are normal.  Cardiovascular: Normal rate and regular rhythm.   Pulmonary/Chest: Effort normal and breath sounds normal.  Abdominal: Soft.  Musculoskeletal: She exhibits no edema.  Neurological: She is alert and oriented to person, place, and time.  Skin: Skin is warm and dry.  Foot exam without change from prior, no cuts or sores   Vitals:   03/03/16 1529  BP: 138/90  Pulse: 95  Temp: 98 F (36.7 C)  TempSrc: Oral  SpO2: 98%  Weight: 228 lb (103.4 kg)  Height: 5\' 4"  (1.626 m)      Assessment & Plan:

## 2016-03-10 ENCOUNTER — Other Ambulatory Visit: Payer: Self-pay | Admitting: *Deleted

## 2016-03-10 MED ORDER — OLMESARTAN-AMLODIPINE-HCTZ 40-10-25 MG PO TABS: 1.0000 | ORAL_TABLET | Freq: Every day | ORAL | 3 refills | Status: DC

## 2016-04-02 ENCOUNTER — Other Ambulatory Visit: Payer: Self-pay | Admitting: Internal Medicine

## 2016-05-02 ENCOUNTER — Ambulatory Visit: Payer: BLUE CROSS/BLUE SHIELD | Admitting: Internal Medicine

## 2016-05-26 ENCOUNTER — Telehealth: Payer: Self-pay | Admitting: *Deleted

## 2016-05-26 ENCOUNTER — Other Ambulatory Visit: Payer: Self-pay

## 2016-05-26 MED ORDER — INSULIN DETEMIR 100 UNIT/ML FLEXPEN: 35.0000 [IU] | PEN_INJECTOR | Freq: Every day | SUBCUTANEOUS | 1 refills | Status: DC

## 2016-05-26 NOTE — Telephone Encounter (Signed)
OK to refill

## 2016-05-26 NOTE — Telephone Encounter (Signed)
Submitted

## 2016-05-26 NOTE — Telephone Encounter (Signed)
Pt left msg on triage requesting refills on her Levemir insulin. Pt see Dr. Elvera Lennox for her diabetes forwarding msg to endo for approval.../lmb

## 2016-06-05 ENCOUNTER — Telehealth: Payer: Self-pay | Admitting: Internal Medicine

## 2016-06-05 ENCOUNTER — Other Ambulatory Visit: Payer: Self-pay

## 2016-06-05 MED ORDER — INSULIN DETEMIR 100 UNIT/ML FLEXPEN: 35.0000 [IU] | PEN_INJECTOR | Freq: Every day | SUBCUTANEOUS | 0 refills | Status: DC

## 2016-06-05 NOTE — Telephone Encounter (Signed)
[facility]

## 2016-06-05 NOTE — Telephone Encounter (Signed)
Refill of Levemir 90 day supply that's the only way the insurance will cover it.  [facility]  She call [facility]

## 2016-09-01 ENCOUNTER — Other Ambulatory Visit: Payer: Self-pay | Admitting: Internal Medicine

## 2016-09-07 ENCOUNTER — Other Ambulatory Visit: Payer: Self-pay | Admitting: Internal Medicine

## 2016-09-11 ENCOUNTER — Other Ambulatory Visit: Payer: Self-pay | Admitting: Internal Medicine

## 2016-10-16 ENCOUNTER — Other Ambulatory Visit: Payer: Self-pay | Admitting: Nurse Practitioner

## 2016-12-11 ENCOUNTER — Other Ambulatory Visit: Payer: Self-pay | Admitting: Internal Medicine

## 2017-01-10 ENCOUNTER — Other Ambulatory Visit: Payer: Self-pay

## 2017-01-10 ENCOUNTER — Emergency Department (HOSPITAL_COMMUNITY)
Admission: EM | Admit: 2017-01-10 | Discharge: 2017-01-10 | Disposition: A | Discharge status: Home / Self Care | Payer: BLUE CROSS/BLUE SHIELD | Attending: Emergency Medicine | Admitting: Emergency Medicine

## 2017-01-10 ENCOUNTER — Encounter (HOSPITAL_COMMUNITY): Payer: Self-pay

## 2017-01-10 DIAGNOSIS — E119 Type 2 diabetes mellitus without complications: Secondary | ICD-10-CM | POA: Insufficient documentation

## 2017-01-10 DIAGNOSIS — Z794 Long term (current) use of insulin: Secondary | ICD-10-CM | POA: Diagnosis not present

## 2017-01-10 DIAGNOSIS — T398X5A Adverse effect of other nonopioid analgesics and antipyretics, not elsewhere classified, initial encounter: Secondary | ICD-10-CM | POA: Insufficient documentation

## 2017-01-10 DIAGNOSIS — R112 Nausea with vomiting, unspecified: Secondary | ICD-10-CM

## 2017-01-10 DIAGNOSIS — I10 Essential (primary) hypertension: Secondary | ICD-10-CM | POA: Insufficient documentation

## 2017-01-10 DIAGNOSIS — Z7982 Long term (current) use of aspirin: Secondary | ICD-10-CM | POA: Diagnosis not present

## 2017-01-10 DIAGNOSIS — Z79899 Other long term (current) drug therapy: Secondary | ICD-10-CM | POA: Insufficient documentation

## 2017-01-10 DIAGNOSIS — T50905A Adverse effect of unspecified drugs, medicaments and biological substances, initial encounter: Secondary | ICD-10-CM

## 2017-01-10 LAB — CBG MONITORING, ED: GLUCOSE-CAPILLARY: 207 mg/dL — AB (ref 65–99)

## 2017-01-10 MED ORDER — ONDANSETRON 4 MG PO TBDP: 4.0000 mg | ORAL_TABLET | Freq: Three times a day (TID) | ORAL | 0 refills | Status: DC | PRN

## 2017-01-10 MED ORDER — ONDANSETRON 4 MG PO TBDP
4.0000 mg | ORAL_TABLET | Freq: Once | ORAL | Status: AC | PRN
  Administered 2017-01-10: 4 mg via ORAL
  Filled 2017-01-10: qty 1

## 2017-01-10 NOTE — ED Notes (Signed)
Bed: WA13 Expected date:  Expected time:  Means of arrival:  Comments: 

## 2017-01-10 NOTE — ED Triage Notes (Signed)
Pt is alert and oriented x 4 and is verbally responsive. PT denies any pain.

## 2017-01-10 NOTE — ED Notes (Signed)
Request  Meghan tech  to obtain CBG.

## 2017-01-10 NOTE — ED Provider Notes (Signed)
[facility]-EMERGENCY DEPT Provider Note   CSN: [id number] Arrival date & time: 01/10/17  1115     History   Chief Complaint Chief Complaint  Patient presents with  . Dizziness  . Nausea    HPI Donna Booker is a 72 y.o. female.  HPI Patient presents with nausea vomiting and lightheadedness.  Began this morning.  Felt a little lightheaded with the episode.  Has been in the waiting room however for around 8 1/2 hours and states she feels much better now.  States she would feel even better she was up and walking around.  No fevers.  She had a fall before Christmas and was started on Ultram for that she took the first dose of last night.  She has not had this medicine previously.  She is diabetic.  She had no chest pain.  No trouble breathing.  She has had some mild lower back pain she has had for months now. Past Medical History:  Diagnosis Date  . Diabetes mellitus   . Hypertension     Patient Active Problem List   Diagnosis Date Noted  . Routine general medical examination at a health care facility 10/20/2015  . Uncontrolled type 2 diabetes mellitus with diabetic polyneuropathy, with long-term current use of insulin (HCC) 02/25/2015  . Right foot pain 01/28/2015  . Hyperlipidemia 09/02/2014  . Morbid obesity (HCC) 09/02/2014  . HTN (hypertension) 10/04/2012    Past Surgical History:  Procedure Laterality Date  . BREAST SURGERY      OB History    No data available       Home Medications    Prior to Admission medications   Medication Sig Start Date End Date Taking? Authorizing Provider  aspirin EC 81 MG tablet Take 81 mg by mouth daily.    [provider]  diclofenac sodium (VOLTAREN) 1 % GEL Apply 2 g topically 4 (four) times daily. 01/27/15   Myrlene Broker, MD  esomeprazole (NEXIUM) 40 MG capsule Take 40 mg by mouth daily before breakfast.    [provider]  gabapentin (NEURONTIN) 100 MG capsule Take 1-2 capsules  (100-200 mg total) by mouth at bedtime. 03/03/16   Myrlene Broker, MD  glipiZIDE (GLUCOTROL) 5 MG tablet Take 1 tablet (5 mg total) by mouth 2 (two) times daily before a meal. NEED ANNUAL VISIT FOR REFILLS 12/13/16   Myrlene Broker, MD  Insulin Detemir (LEVEMIR) 100 UNIT/ML Pen Inject 35 Units into the skin daily at 10 pm. 06/05/16   Carlus Pavlov, MD  Liraglutide (VICTOZA) 18 MG/3ML SOPN Inject 0.3 mLs (1.8 mg total) into the skin daily before breakfast. 05/26/15   Myrlene Broker, MD  metFORMIN (GLUCOPHAGE) 1000 MG tablet TAKE 1 TABLET DAILY WITH SUPPER 01/07/16   Carlus Pavlov, MD  Olmesartan-Amlodipine-HCTZ [facility]) 40-10-25 MG TABS Take 1 tablet by mouth daily. 03/10/16   Myrlene Broker, MD  ondansetron (ZOFRAN-ODT) 4 MG disintegrating tablet Take 1 tablet (4 mg total) by mouth every 8 (eight) hours as needed for nausea or vomiting. 01/10/17   Benjiman Core, MD  pravastatin (PRAVACHOL) 40 MG tablet Take 1 tablet (40 mg total) by mouth daily. 10/28/15   Myrlene Broker, MD    Family History History reviewed. No pertinent family history.  Social History Social History   Tobacco Use  . Smoking status: Never Smoker  . Smokeless tobacco: Never Used  Substance Use Topics  . Alcohol use: No  . Drug use: No  Allergies   Tramadol   Review of Systems Review of Systems  Constitutional: Positive for appetite change. Negative for fever.  HENT: Negative for congestion.   Respiratory: Negative for shortness of breath.   Cardiovascular: Negative for chest pain.  Gastrointestinal: Positive for nausea and vomiting. Negative for abdominal pain.  Genitourinary: Negative for flank pain.  Musculoskeletal: Negative for back pain.  Neurological: Positive for light-headedness. Negative for tremors.  Hematological: Negative for adenopathy.  Psychiatric/Behavioral: Negative for confusion.     Physical Exam Updated Vital Signs BP (!) 178/89 (BP Location:  Right Arm)   Pulse 90   Temp 98.1 F (36.7 C) (Oral)   Resp 18   Ht 5\' 4"  (1.626 m)   Wt 101.6 kg (224 lb)   SpO2 98%   BMI 38.45 kg/m   Physical Exam  Constitutional: She appears well-developed.  HENT:  Head: Atraumatic.  Eyes: Pupils are equal, round, and reactive to light.  Neck: Neck supple.  Cardiovascular: Normal rate.  Pulmonary/Chest: Effort normal.  Abdominal: Soft. There is no tenderness.  Musculoskeletal: She exhibits no edema.  Neurological: She is alert.  Skin: Skin is warm. Capillary refill takes less than 2 seconds.  Psychiatric: She has a normal mood and affect.     ED Treatments / Results  Labs (all labs ordered are listed, but only abnormal results are displayed) Labs Reviewed  CBG MONITORING, ED - Abnormal; Notable for the following components:      Result Value   Glucose-Capillary 207 (*)    All other components within normal limits    EKG  EKG Interpretation None       Radiology No results found.  Procedures Procedures (including critical care time)  Medications Ordered in ED Medications  ondansetron (ZOFRAN-ODT) disintegrating tablet 4 mg (4 mg Oral Given 01/10/17 1138)     Initial Impression / Assessment and Plan / ED Course  I have reviewed the triage vital signs and the nursing notes.  Pertinent labs & imaging results that were available during my care of the patient were reviewed by me and considered in my medical decision making (see chart for details).     Patient with nausea and vomiting.  Began this morning after taking tramadol for the first time last night.  Has been waiting in the ER for around 9 hours.  Symptoms have resolved.  Has tolerated orals.  Will discharge home.  Benign exam.  Final Clinical Impressions(s) / ED Diagnoses   Final diagnoses:  Adverse effect of drug, initial encounter  Non-intractable vomiting with nausea, unspecified vomiting type    ED Discharge Orders        Ordered    ondansetron  (ZOFRAN-ODT) 4 MG disintegrating tablet  Every 8 hours PRN     01/10/17 2042       2043, MD 01/11/17 [phone]

## 2017-01-10 NOTE — ED Triage Notes (Signed)
Pt arrived via EMS pt was from a clinic. Pt reports that she has been having dizziness, and nausea x 5 hours. Pt denies vomiting. Pt reports recently taking tramadol starting last night.    V/s EMS  139/83 hr,94 RR 16 99% RA and CBG 244.

## 2017-01-22 LAB — HM MAMMOGRAPHY

## 2017-03-04 ENCOUNTER — Emergency Department (HOSPITAL_COMMUNITY): Payer: BLUE CROSS/BLUE SHIELD

## 2017-03-04 ENCOUNTER — Emergency Department (HOSPITAL_BASED_OUTPATIENT_CLINIC_OR_DEPARTMENT_OTHER)
Admit: 2017-03-04 | Discharge: 2017-03-04 | Disposition: A | Discharge status: Home / Self Care | Payer: BLUE CROSS/BLUE SHIELD | Attending: Emergency Medicine | Admitting: Emergency Medicine

## 2017-03-04 ENCOUNTER — Emergency Department (HOSPITAL_COMMUNITY)
Admission: EM | Admit: 2017-03-04 | Discharge: 2017-03-04 | Disposition: A | Discharge status: Home / Self Care | Payer: BLUE CROSS/BLUE SHIELD | Attending: Emergency Medicine | Admitting: Emergency Medicine

## 2017-03-04 ENCOUNTER — Encounter (HOSPITAL_COMMUNITY): Payer: Self-pay | Admitting: *Deleted

## 2017-03-04 DIAGNOSIS — M79604 Pain in right leg: Secondary | ICD-10-CM | POA: Diagnosis not present

## 2017-03-04 DIAGNOSIS — Z79899 Other long term (current) drug therapy: Secondary | ICD-10-CM | POA: Diagnosis not present

## 2017-03-04 DIAGNOSIS — Z7982 Long term (current) use of aspirin: Secondary | ICD-10-CM | POA: Diagnosis not present

## 2017-03-04 DIAGNOSIS — E1142 Type 2 diabetes mellitus with diabetic polyneuropathy: Secondary | ICD-10-CM | POA: Insufficient documentation

## 2017-03-04 DIAGNOSIS — R0602 Shortness of breath: Secondary | ICD-10-CM | POA: Diagnosis present

## 2017-03-04 DIAGNOSIS — M79609 Pain in unspecified limb: Secondary | ICD-10-CM | POA: Diagnosis not present

## 2017-03-04 DIAGNOSIS — Z794 Long term (current) use of insulin: Secondary | ICD-10-CM | POA: Insufficient documentation

## 2017-03-04 DIAGNOSIS — D649 Anemia, unspecified: Secondary | ICD-10-CM | POA: Diagnosis not present

## 2017-03-04 DIAGNOSIS — R0609 Other forms of dyspnea: Secondary | ICD-10-CM | POA: Insufficient documentation

## 2017-03-04 DIAGNOSIS — M7989 Other specified soft tissue disorders: Secondary | ICD-10-CM

## 2017-03-04 DIAGNOSIS — I10 Essential (primary) hypertension: Secondary | ICD-10-CM | POA: Diagnosis not present

## 2017-03-04 LAB — COMPREHENSIVE METABOLIC PANEL
ALBUMIN: 3.4 g/dL — AB (ref 3.5–5.0)
ALT: 26 U/L (ref 14–54)
AST: 29 U/L (ref 15–41)
Alkaline Phosphatase: 67 U/L (ref 38–126)
Anion gap: 8 (ref 5–15)
BUN: 26 mg/dL — AB (ref 6–20)
CHLORIDE: 110 mmol/L (ref 101–111)
CO2: 25 mmol/L (ref 22–32)
CREATININE: 0.99 mg/dL (ref 0.44–1.00)
Calcium: 9.9 mg/dL (ref 8.9–10.3)
GFR calc Af Amer: >60 mL/min (ref >60)
GFR, EST NON AFRICAN AMERICAN: 57 mL/min — AB (ref >60)
GLUCOSE: 135 mg/dL — AB (ref 65–99)
POTASSIUM: 5 mmol/L (ref 3.5–5.1)
Sodium: 143 mmol/L (ref 135–145)
Total Bilirubin: 0.5 mg/dL (ref 0.3–1.2)
Total Protein: 7.3 g/dL (ref 6.5–8.1)

## 2017-03-04 LAB — CBC WITH DIFFERENTIAL/PLATELET
BASOS ABS: 0 10*3/uL (ref 0.0–0.1)
BASOS PCT: 0 %
EOS PCT: 2 %
Eosinophils Absolute: 0.2 10*3/uL (ref 0.0–0.7)
HEMATOCRIT: 29.7 % — AB (ref 36.0–46.0)
Hemoglobin: 9.2 g/dL — ABNORMAL LOW (ref 12.0–15.0)
LYMPHS PCT: 47 %
Lymphs Abs: 4.8 10*3/uL — ABNORMAL HIGH (ref 0.7–4.0)
MCH: 23.9 pg — ABNORMAL LOW (ref 26.0–34.0)
MCHC: 31 g/dL (ref 30.0–36.0)
MCV: 77.1 fL — AB (ref 78.0–100.0)
Monocytes Absolute: 1.2 10*3/uL — ABNORMAL HIGH (ref 0.1–1.0)
Monocytes Relative: 12 %
NEUTROS ABS: 4 10*3/uL (ref 1.7–7.7)
Neutrophils Relative %: 39 %
PLATELETS: 268 10*3/uL (ref 150–400)
RBC: 3.85 MIL/uL — AB (ref 3.87–5.11)
RDW: 14.1 % (ref 11.5–15.5)
WBC: 10.2 10*3/uL (ref 4.0–10.5)

## 2017-03-04 LAB — I-STAT TROPONIN, ED: Troponin i, poc: 0 ng/mL (ref 0.00–0.08)

## 2017-03-04 LAB — CBG MONITORING, ED
GLUCOSE-CAPILLARY: 117 mg/dL — AB (ref 65–99)
GLUCOSE-CAPILLARY: 95 mg/dL (ref 65–99)
Glucose-Capillary: 125 mg/dL — ABNORMAL HIGH (ref 65–99)

## 2017-03-04 LAB — D-DIMER, QUANTITATIVE: D-Dimer, Quant: 1.81 ug/mL-FEU — ABNORMAL HIGH (ref 0.00–0.50)

## 2017-03-04 LAB — BRAIN NATRIURETIC PEPTIDE: B Natriuretic Peptide: 216.3 pg/mL — ABNORMAL HIGH (ref 0.0–100.0)

## 2017-03-04 MED ORDER — SODIUM CHLORIDE 0.9 % IJ SOLN
INTRAMUSCULAR | Status: AC
  Administered 2017-03-04: 13:00:00
  Filled 2017-03-04: qty 50

## 2017-03-04 MED ORDER — IOPAMIDOL (ISOVUE-370) INJECTION 76%
100.0000 mL | Freq: Once | INTRAVENOUS | Status: AC | PRN
  Administered 2017-03-04: 80 mL via INTRAVENOUS

## 2017-03-04 MED ORDER — IOPAMIDOL (ISOVUE-370) INJECTION 76%
INTRAVENOUS | Status: AC
  Filled 2017-03-04: qty 100

## 2017-03-04 NOTE — Progress Notes (Signed)
Preliminary results by tech - Right Lower Ext. Venous Duplex Completed. Negative for deep and superficial vein thrombosis. A small popliteal cystic structure was noted. Marilynne Halsted, BS, RDMS, RVT

## 2017-03-04 NOTE — ED Notes (Signed)
ED Provider at bedside. 

## 2017-03-04 NOTE — ED Provider Notes (Signed)
[facility]-EMERGENCY DEPT Provider Note  CSN: [id number] Arrival date & time: 03/04/17 0831  Chief Complaint(s) Shortness of Breath and Leg Swelling (right)  HPI Donna Booker is a 72 y.o. female   The history is provided by the patient.  Shortness of Breath  This is a new problem. The average episode lasts 1 week. The problem occurs continuously.The problem has been gradually worsening. Associated symptoms include cough (dry), orthopnea ( for 1 month), chest pain (intermittent, sporadic. brief, lasting 1 sec. sharp. nonradiating. nonexertion ) and leg swelling (bilateral, but R>L). Pertinent negatives include no fever, no rhinorrhea and no sputum production. Associated medical issues do not include asthma, COPD, pneumonia, chronic lung disease, PE, CAD, heart failure, past MI or DVT.    Past Medical History Past Medical History:  Diagnosis Date  . Diabetes mellitus   . Hypertension    Patient Active Problem List   Diagnosis Date Noted  . Routine general medical examination at a health care facility 10/20/2015  . Uncontrolled type 2 diabetes mellitus with diabetic polyneuropathy, with long-term current use of insulin (HCC) 02/25/2015  . Right foot pain 01/28/2015  . Hyperlipidemia 09/02/2014  . Morbid obesity (HCC) 09/02/2014  . HTN (hypertension) 10/04/2012   Home Medication(s) Prior to Admission medications   Medication Sig Start Date End Date Taking? Authorizing Provider  amLODipine (NORVASC) 10 MG tablet Take 10 mg by mouth daily.   Yes [provider]  aspirin EC 81 MG tablet Take 81 mg by mouth daily.   Yes [provider]  diclofenac sodium (VOLTAREN) 1 % GEL Apply 2 g topically 4 (four) times daily. 01/27/15  Yes Myrlene Broker, MD  esomeprazole (NEXIUM) 40 MG capsule Take 40 mg by mouth daily before breakfast.   Yes [provider]  gabapentin (NEURONTIN) 300 MG capsule Take 300 mg by mouth 3 (three) times daily.  02/22/17  Yes [provider]  glipiZIDE (GLUCOTROL) 5 MG tablet Take 1 tablet (5 mg total) by mouth 2 (two) times daily before a meal. NEED ANNUAL VISIT FOR REFILLS Patient taking differently: Take 5 mg by mouth daily before breakfast. NEED ANNUAL VISIT FOR REFILLS 12/13/16  Yes Myrlene Broker, MD  ibuprofen (ADVIL,MOTRIN) 200 MG tablet Take 400 mg by mouth every 6 (six) hours as needed for mild pain.   Yes [provider]  Insulin Detemir (LEVEMIR) 100 UNIT/ML Pen Inject 35 Units into the skin daily at 10 pm. 06/05/16  Yes Carlus Pavlov, MD  metFORMIN (GLUCOPHAGE) 1000 MG tablet TAKE 1 TABLET DAILY WITH SUPPER Patient taking differently: Take 1,000 mg by mouth 2 (two) times daily with a meal.  01/07/16  Yes Carlus Pavlov, MD  pravastatin (PRAVACHOL) 40 MG tablet Take 1 tablet (40 mg total) by mouth daily. 10/28/15  Yes Myrlene Broker, MD  gabapentin (NEURONTIN) 100 MG capsule Take 1-2 capsules (100-200 mg total) by mouth at bedtime. Patient not taking: Reported on 03/04/2017 03/03/16   Myrlene Broker, MD  Liraglutide (VICTOZA) 18 MG/3ML SOPN Inject 0.3 mLs (1.8 mg total) into the skin daily before breakfast. Patient not taking: Reported on 03/04/2017 05/26/15   Myrlene Broker, MD  Olmesartan-Amlodipine-HCTZ [facility]) 40-10-25 MG TABS Take 1 tablet by mouth daily. Patient not taking: Reported on 03/04/2017 03/10/16   Myrlene Broker, MD  ondansetron (ZOFRAN-ODT) 4 MG disintegrating tablet Take 1 tablet (4 mg total) by mouth every 8 (eight) hours as needed for nausea or vomiting. Patient not taking: Reported on  03/04/2017 01/10/17   Benjiman Core, MD                                                                                                                                    Past Surgical History Past Surgical History:  Procedure Laterality Date  . BREAST SURGERY     Family History No family history on file.  Social History Social History    Tobacco Use  . Smoking status: Never Smoker  . Smokeless tobacco: Never Used  Substance Use Topics  . Alcohol use: No  . Drug use: No   Allergies Tramadol  Review of Systems Review of Systems  Constitutional: Negative for fever.  HENT: Negative for rhinorrhea.   Respiratory: Positive for cough (dry) and shortness of breath. Negative for sputum production.   Cardiovascular: Positive for chest pain (intermittent, sporadic. brief, lasting 1 sec. sharp. nonradiating. nonexertion ), orthopnea ( for 1 month) and leg swelling (bilateral, but R>L).   All other systems are reviewed and are negative for acute change except as noted in the HPI  Physical Exam Vital Signs  I have reviewed the triage vital signs BP (!) 179/72 (BP Location: Left Arm)   Pulse (!) 101   Temp 98.1 F (36.7 C) (Oral)   Resp 20   Ht 5\' 5"  (1.651 m)   Wt 103 kg (227 lb)   SpO2 97%   BMI 37.77 kg/m   Physical Exam  Constitutional: She is oriented to person, place, and time. She appears well-developed and well-nourished. No distress.  HENT:  Head: Normocephalic and atraumatic.  Nose: Nose normal.  Eyes: Conjunctivae and EOM are normal. Pupils are equal, round, and reactive to light. Right eye exhibits no discharge. Left eye exhibits no discharge. No scleral icterus.  Neck: Normal range of motion. Neck supple. No JVD present.  Cardiovascular: Normal rate and regular rhythm. Exam reveals no gallop and no friction rub.  No murmur heard. Pulmonary/Chest: Effort normal and breath sounds normal. No stridor. No respiratory distress. She has no rales.  Abdominal: Soft. She exhibits no distension. There is no tenderness.  Musculoskeletal: She exhibits no edema or tenderness.  1+LLE edema. 1.5-2+ RLE edema.  Neurological: She is alert and oriented to person, place, and time.  Skin: Skin is warm and dry. No rash noted. She is not diaphoretic. No erythema.  Psychiatric: She has a normal mood and affect.  Vitals  reviewed.   ED Results and Treatments Labs (all labs ordered are listed, but only abnormal results are displayed) Labs Reviewed  CBC WITH DIFFERENTIAL/PLATELET - Abnormal; Notable for the following components:      Result Value   RBC 3.85 (*)    Hemoglobin 9.2 (*)    HCT 29.7 (*)    MCV 77.1 (*)    MCH 23.9 (*)    Lymphs Abs 4.8 (*)    Monocytes Absolute 1.2 (*)    All other  components within normal limits  COMPREHENSIVE METABOLIC PANEL - Abnormal; Notable for the following components:   Glucose, Bld 135 (*)    BUN 26 (*)    Albumin 3.4 (*)    GFR calc non Af Amer 57 (*)    All other components within normal limits  BRAIN NATRIURETIC PEPTIDE - Abnormal; Notable for the following components:   B Natriuretic Peptide 216.3 (*)    All other components within normal limits  D-DIMER, QUANTITATIVE (NOT AT [facility]) - Abnormal; Notable for the following components:   D-Dimer, Quant 1.81 (*)    All other components within normal limits  CBG MONITORING, ED - Abnormal; Notable for the following components:   Glucose-Capillary 117 (*)    All other components within normal limits  CBG MONITORING, ED - Abnormal; Notable for the following components:   Glucose-Capillary 125 (*)    All other components within normal limits  I-STAT TROPONIN, ED  CBG MONITORING, ED                                                                                                                         EKG  EKG Interpretation  Date/Time:  Sunday March 04 2017 08:41:34 EST Ventricular Rate:  94 PR Interval:    QRS Duration: 77 QT Interval:  338 QTC Calculation: 423 R Axis:   3 Text Interpretation:  Sinus tachycardia Multiple ventricular premature complexes Probable left atrial enlargement Abnormal R-wave progression, early transition NO STEMI No old tracing to compare Confirmed by Drema Pry [phone]) on 03/04/2017 9:15:03 AM      Radiology Dg Chest 2 View  Result Date: 03/04/2017 CLINICAL DATA:   Shortness of breath for the past week and a half. Former smoker. History of hypertension and diabetes. EXAM: CHEST  2 VIEW COMPARISON:  10/04/2012; 07/09/2011 FINDINGS: Grossly unchanged cardiac silhouette and mediastinal contours with atherosclerotic plaque within the thoracic aorta. There is a minimal amount of pleuroparenchymal thickening about the right minor fissure. No focal airspace opacities. No pleural effusion or pneumothorax. No evidence of edema. Post lower cervical ACDF, incompletely evaluated. Tiny ossicle is noted about the superior aspect of the right AC joint, likely a sequela of remote trauma. No acute osseus abnormalities. IMPRESSION: No acute cardiopulmonary disease. Electronically Signed   By: Simonne Come M.D.   On: 03/04/2017 09:12   Ct Angio Chest Pe W And/or Wo Contrast  Result Date: 03/04/2017 CLINICAL DATA:  Pt complains of right lower leg swelling for the past few months, shortness of breath since last week. Pt states she also has intermittent chest pain. Pt denies cough. EXAM: CT ANGIOGRAPHY CHEST WITH CONTRAST TECHNIQUE: Multidetector CT imaging of the chest was performed using the standard protocol during bolus administration of intravenous contrast. Multiplanar CT image reconstructions and MIPs were obtained to evaluate the vascular anatomy. CONTRAST:  80 mL ISOVUE-370 IOPAMIDOL (ISOVUE-370) INJECTION 76% COMPARISON:  Current chest radiograph. FINDINGS: Cardiovascular: Satisfactory opacification of the pulmonary arteries to the segmental level. No  evidence of pulmonary embolism. Normal heart size. No pericardial effusion. Three-vessel coronary artery calcifications most evident on the left. Ascending thoracic aorta is top normal in diameter. There is mild atherosclerotic disease along the arch and descending thoracic aorta. Mediastinum/Nodes: Thyroid gland is enlarged. There is no defined nodule. No neck base or axillary masses or enlarged lymph nodes. No mediastinal or hilar masses  or adenopathy. Trachea is widely patent. Esophagus is unremarkable. Lungs/Pleura: Lungs are clear. No pleural effusion or pneumothorax. Upper Abdomen: No acute abnormality. Musculoskeletal: No fracture or acute finding. No osteoblastic or osteolytic lesions. Review of the MIP images confirms the above findings. IMPRESSION: 1. No evidence of a pulmonary embolism. 2. No acute findings. 3. Coronary artery calcifications. 4. Mild thoracic aortic atherosclerosis. 5. Enlarged thyroid gland. Aortic Atherosclerosis (ICD10-I70.0). Electronically Signed   By: Amie Portland M.D.   On: 03/04/2017 12:16   Pertinent labs & imaging results that were available during my care of the patient were reviewed by me and considered in my medical decision making (see chart for details).  Medications Ordered in ED Medications  sodium chloride 0.9 % injection (  Given 03/04/17 1237)  iopamidol (ISOVUE-370) 76 % injection 100 mL ( Intravenous Canceled Entry 03/04/17 1238)                                                                                                                                    Procedures Procedures   EMERGENCY DEPARTMENT [place] CARDIAC EXAM "Study: Limited Ultrasound of the Heart and Pericardium"  INDICATIONS:Dyspnea Multiple views of the heart and pericardium were obtained in real-time with a multi-frequency probe.  PERFORMED ZO:XWRUEA IMAGES ARCHIVED?: Yes LIMITATIONS:  Body habitus VIEWS USED: Parasternal long axis, Parasternal short axis and Apical 4 chamber  INTERPRETATION: Cardiac activity present, Pericardial effusioin absent, Cardiac tamponade absent, Normal contractility and RV size WNL   (including critical care time)  Medical Decision Making / ED Course I have reviewed the nursing notes for this encounter and the patient's prior records (if available in EHR or on provided paperwork).    Patient endorses several weeks of gradually worsening dyspnea on exertion with lower extremity edema.   She has had sporadic chest pain but not associated with the symptoms.  No prior history of MIs or heart failure.  No prior history of DVTs or PEs.  No personal history of cancer.  Patient is well-appearing and well-hydrated.  In no respiratory distress.  Satting well on room air.  EKG reveals sinus tachycardia without evidence of acute ischemia.  On exam patient does have evidence of peripheral edema to the lower extremities right greater than left.  Considering new onset heart failure versus pulmonary embolism. Also considered possible pericardial effusion.  Bedside ultrasound was performed which did not reveal evidence of a pericardial effusion.  Heart function was within normal limits.  No evidence of RV dilatation.  Chest x-ray negative without pulmonary edema, pulmonary vascular  congestion, pleural effusions or pneumonia.  No pneumothorax.  Her workup was not consistent with CHF exacerbation revealing mildly elevated BNP with negative troponin.  Labs did reveal anemia.  Patient denied any known source of bleed.  D-dimer was obtained and was elevated but CT scan was negative for pulmonary embolism.  Given the asymmetry of the lower extremity edema with positive dimer ultrasound was obtained which was negative for DVT.  Patient was ambulated without any respiratory distress or hypoxia.  The patient appears reasonably screened and/or stabilized for discharge and I doubt any other medical condition or other [facility] requiring further screening, evaluation, or treatment in the ED at this time prior to discharge.  The patient is safe for discharge with strict return precautions.  Recommended close follow-up with primary care provider.   Final Clinical Impression(s) / ED Diagnoses Final diagnoses:  Dyspnea on exertion  Anemia, unspecified type   Disposition: Discharge  Condition: Good  I have discussed the results, Dx and Tx plan with the patient who expressed understanding and agree(s) with the plan.  Discharge instructions discussed at great length. The patient was given strict return precautions who verbalized understanding of the instructions. No further questions at time of discharge.    ED Discharge Orders    None       Follow Up: Primary care provider   in 3-5 days, If symptoms do not improve or  worsen     This chart was dictated using voice recognition software.  Despite best efforts to proofread,  errors can occur which can change the documentation meaning.   Nira Conn, MD 03/04/17 1537

## 2017-03-04 NOTE — ED Notes (Signed)
ED Provider will insert IV Ultrasound. 

## 2017-03-04 NOTE — ED Triage Notes (Signed)
Pt complains of right lower leg swelling for the past few months, shortness of breath since last week. Pt states she also has intermittent chest pain. Pt denies cough.

## 2017-04-14 ENCOUNTER — Inpatient Hospital Stay (HOSPITAL_COMMUNITY)
Admission: EM | Admit: 2017-04-14 | Discharge: 2017-04-20 | DRG: 309 | Disposition: A | Discharge status: Home Health | Payer: BLUE CROSS/BLUE SHIELD | Attending: Internal Medicine | Admitting: Internal Medicine

## 2017-04-14 DIAGNOSIS — N183 Chronic kidney disease, stage 3 (moderate): Secondary | ICD-10-CM | POA: Diagnosis present

## 2017-04-14 DIAGNOSIS — E785 Hyperlipidemia, unspecified: Secondary | ICD-10-CM | POA: Diagnosis present

## 2017-04-14 DIAGNOSIS — M7989 Other specified soft tissue disorders: Secondary | ICD-10-CM

## 2017-04-14 DIAGNOSIS — I129 Hypertensive chronic kidney disease with stage 1 through stage 4 chronic kidney disease, or unspecified chronic kidney disease: Secondary | ICD-10-CM | POA: Diagnosis present

## 2017-04-14 DIAGNOSIS — I4891 Unspecified atrial fibrillation: Secondary | ICD-10-CM | POA: Diagnosis not present

## 2017-04-14 DIAGNOSIS — E0500 Thyrotoxicosis with diffuse goiter without thyrotoxic crisis or storm: Secondary | ICD-10-CM | POA: Diagnosis present

## 2017-04-14 DIAGNOSIS — D631 Anemia in chronic kidney disease: Secondary | ICD-10-CM | POA: Diagnosis present

## 2017-04-14 DIAGNOSIS — E1165 Type 2 diabetes mellitus with hyperglycemia: Secondary | ICD-10-CM

## 2017-04-14 DIAGNOSIS — W19XXXA Unspecified fall, initial encounter: Secondary | ICD-10-CM

## 2017-04-14 DIAGNOSIS — E1142 Type 2 diabetes mellitus with diabetic polyneuropathy: Secondary | ICD-10-CM | POA: Diagnosis present

## 2017-04-14 DIAGNOSIS — Z87891 Personal history of nicotine dependence: Secondary | ICD-10-CM

## 2017-04-14 DIAGNOSIS — R05 Cough: Secondary | ICD-10-CM

## 2017-04-14 DIAGNOSIS — R059 Cough, unspecified: Secondary | ICD-10-CM

## 2017-04-14 DIAGNOSIS — I272 Pulmonary hypertension, unspecified: Secondary | ICD-10-CM | POA: Diagnosis present

## 2017-04-14 DIAGNOSIS — E1122 Type 2 diabetes mellitus with diabetic chronic kidney disease: Secondary | ICD-10-CM | POA: Diagnosis present

## 2017-04-14 DIAGNOSIS — M79643 Pain in unspecified hand: Secondary | ICD-10-CM

## 2017-04-14 DIAGNOSIS — E118 Type 2 diabetes mellitus with unspecified complications: Secondary | ICD-10-CM

## 2017-04-14 DIAGNOSIS — R0602 Shortness of breath: Secondary | ICD-10-CM | POA: Diagnosis not present

## 2017-04-14 DIAGNOSIS — Z6836 Body mass index (BMI) 36.0-36.9, adult: Secondary | ICD-10-CM

## 2017-04-14 DIAGNOSIS — J811 Chronic pulmonary edema: Secondary | ICD-10-CM

## 2017-04-14 DIAGNOSIS — Z79899 Other long term (current) drug therapy: Secondary | ICD-10-CM

## 2017-04-14 DIAGNOSIS — Z7982 Long term (current) use of aspirin: Secondary | ICD-10-CM

## 2017-04-14 DIAGNOSIS — N179 Acute kidney failure, unspecified: Secondary | ICD-10-CM | POA: Diagnosis present

## 2017-04-14 DIAGNOSIS — E0590 Thyrotoxicosis, unspecified without thyrotoxic crisis or storm: Secondary | ICD-10-CM

## 2017-04-14 DIAGNOSIS — I10 Essential (primary) hypertension: Secondary | ICD-10-CM | POA: Diagnosis present

## 2017-04-14 DIAGNOSIS — Z794 Long term (current) use of insulin: Secondary | ICD-10-CM

## 2017-04-14 NOTE — ED Triage Notes (Addendum)
Pt to ed from home cc/o sob ongoing. Cough and mucuous x3 days. EMS on arrival sts pt in afib HR 140-170s with no hx. 5mg  albuterol with relief. 5mg  metoprolol to HR 80-140.

## 2017-04-15 ENCOUNTER — Inpatient Hospital Stay (HOSPITAL_COMMUNITY): Payer: BLUE CROSS/BLUE SHIELD

## 2017-04-15 ENCOUNTER — Emergency Department (HOSPITAL_COMMUNITY): Payer: BLUE CROSS/BLUE SHIELD

## 2017-04-15 ENCOUNTER — Other Ambulatory Visit: Payer: Self-pay

## 2017-04-15 DIAGNOSIS — Z87891 Personal history of nicotine dependence: Secondary | ICD-10-CM | POA: Diagnosis not present

## 2017-04-15 DIAGNOSIS — I10 Essential (primary) hypertension: Secondary | ICD-10-CM | POA: Diagnosis not present

## 2017-04-15 DIAGNOSIS — M79609 Pain in unspecified limb: Secondary | ICD-10-CM

## 2017-04-15 DIAGNOSIS — Z6836 Body mass index (BMI) 36.0-36.9, adult: Secondary | ICD-10-CM | POA: Diagnosis not present

## 2017-04-15 DIAGNOSIS — I340 Nonrheumatic mitral (valve) insufficiency: Secondary | ICD-10-CM | POA: Diagnosis not present

## 2017-04-15 DIAGNOSIS — R0602 Shortness of breath: Secondary | ICD-10-CM

## 2017-04-15 DIAGNOSIS — D72829 Elevated white blood cell count, unspecified: Secondary | ICD-10-CM | POA: Diagnosis not present

## 2017-04-15 DIAGNOSIS — R7989 Other specified abnormal findings of blood chemistry: Secondary | ICD-10-CM | POA: Diagnosis not present

## 2017-04-15 DIAGNOSIS — E0590 Thyrotoxicosis, unspecified without thyrotoxic crisis or storm: Secondary | ICD-10-CM | POA: Diagnosis not present

## 2017-04-15 DIAGNOSIS — E1165 Type 2 diabetes mellitus with hyperglycemia: Secondary | ICD-10-CM | POA: Diagnosis not present

## 2017-04-15 DIAGNOSIS — M7989 Other specified soft tissue disorders: Secondary | ICD-10-CM

## 2017-04-15 DIAGNOSIS — I272 Pulmonary hypertension, unspecified: Secondary | ICD-10-CM | POA: Diagnosis present

## 2017-04-15 DIAGNOSIS — I4891 Unspecified atrial fibrillation: Secondary | ICD-10-CM | POA: Diagnosis present

## 2017-04-15 DIAGNOSIS — E119 Type 2 diabetes mellitus without complications: Secondary | ICD-10-CM | POA: Diagnosis not present

## 2017-04-15 DIAGNOSIS — E1122 Type 2 diabetes mellitus with diabetic chronic kidney disease: Secondary | ICD-10-CM | POA: Diagnosis present

## 2017-04-15 DIAGNOSIS — E1142 Type 2 diabetes mellitus with diabetic polyneuropathy: Secondary | ICD-10-CM | POA: Diagnosis present

## 2017-04-15 DIAGNOSIS — D631 Anemia in chronic kidney disease: Secondary | ICD-10-CM | POA: Diagnosis present

## 2017-04-15 DIAGNOSIS — E0500 Thyrotoxicosis with diffuse goiter without thyrotoxic crisis or storm: Secondary | ICD-10-CM | POA: Diagnosis present

## 2017-04-15 DIAGNOSIS — Z794 Long term (current) use of insulin: Secondary | ICD-10-CM | POA: Diagnosis not present

## 2017-04-15 DIAGNOSIS — R05 Cough: Secondary | ICD-10-CM | POA: Diagnosis not present

## 2017-04-15 DIAGNOSIS — N179 Acute kidney failure, unspecified: Secondary | ICD-10-CM | POA: Diagnosis present

## 2017-04-15 DIAGNOSIS — R062 Wheezing: Secondary | ICD-10-CM | POA: Diagnosis not present

## 2017-04-15 DIAGNOSIS — Z7982 Long term (current) use of aspirin: Secondary | ICD-10-CM | POA: Diagnosis not present

## 2017-04-15 DIAGNOSIS — I509 Heart failure, unspecified: Secondary | ICD-10-CM | POA: Diagnosis not present

## 2017-04-15 DIAGNOSIS — N183 Chronic kidney disease, stage 3 (moderate): Secondary | ICD-10-CM | POA: Diagnosis present

## 2017-04-15 DIAGNOSIS — E785 Hyperlipidemia, unspecified: Secondary | ICD-10-CM | POA: Diagnosis present

## 2017-04-15 DIAGNOSIS — Z79899 Other long term (current) drug therapy: Secondary | ICD-10-CM | POA: Diagnosis not present

## 2017-04-15 DIAGNOSIS — Z8679 Personal history of other diseases of the circulatory system: Secondary | ICD-10-CM | POA: Diagnosis not present

## 2017-04-15 DIAGNOSIS — I361 Nonrheumatic tricuspid (valve) insufficiency: Secondary | ICD-10-CM | POA: Diagnosis not present

## 2017-04-15 DIAGNOSIS — I129 Hypertensive chronic kidney disease with stage 1 through stage 4 chronic kidney disease, or unspecified chronic kidney disease: Secondary | ICD-10-CM | POA: Diagnosis present

## 2017-04-15 DIAGNOSIS — J9621 Acute and chronic respiratory failure with hypoxia: Secondary | ICD-10-CM | POA: Diagnosis not present

## 2017-04-15 LAB — CBC WITH DIFFERENTIAL/PLATELET
BASOS ABS: 0 10*3/uL (ref 0.0–0.1)
Basophils Relative: 0 %
EOS PCT: 2 %
Eosinophils Absolute: 0.2 10*3/uL (ref 0.0–0.7)
HEMATOCRIT: 27.6 % — AB (ref 36.0–46.0)
HEMOGLOBIN: 8.7 g/dL — AB (ref 12.0–15.0)
Lymphocytes Relative: 47 %
Lymphs Abs: 4.5 10*3/uL — ABNORMAL HIGH (ref 0.7–4.0)
MCH: 23.8 pg — ABNORMAL LOW (ref 26.0–34.0)
MCHC: 31.5 g/dL (ref 30.0–36.0)
MCV: 75.4 fL — ABNORMAL LOW (ref 78.0–100.0)
Monocytes Absolute: 1.1 10*3/uL — ABNORMAL HIGH (ref 0.1–1.0)
Monocytes Relative: 11 %
NEUTROS ABS: 3.8 10*3/uL (ref 1.7–7.7)
NEUTROS PCT: 40 %
PLATELETS: 240 10*3/uL (ref 150–400)
RBC: 3.66 MIL/uL — AB (ref 3.87–5.11)
RDW: 14.6 % (ref 11.5–15.5)
WBC: 9.5 10*3/uL (ref 4.0–10.5)

## 2017-04-15 LAB — MAGNESIUM: Magnesium: 1.5 mg/dL — ABNORMAL LOW (ref 1.7–2.4)

## 2017-04-15 LAB — BASIC METABOLIC PANEL
ANION GAP: 10 (ref 5–15)
Anion gap: 11 (ref 5–15)
BUN: 21 mg/dL — AB (ref 6–20)
BUN: 22 mg/dL — ABNORMAL HIGH (ref 6–20)
CHLORIDE: 109 mmol/L (ref 101–111)
CO2: 20 mmol/L — ABNORMAL LOW (ref 22–32)
CO2: 20 mmol/L — ABNORMAL LOW (ref 22–32)
Calcium: 9.3 mg/dL (ref 8.9–10.3)
Calcium: 9.6 mg/dL (ref 8.9–10.3)
Chloride: 109 mmol/L (ref 101–111)
Creatinine, Ser: 0.82 mg/dL (ref 0.44–1.00)
Creatinine, Ser: 0.84 mg/dL (ref 0.44–1.00)
GFR calc Af Amer: >60 mL/min (ref >60)
GFR calc Af Amer: >60 mL/min (ref >60)
GFR calc non Af Amer: >60 mL/min (ref >60)
GLUCOSE: 168 mg/dL — AB (ref 65–99)
GLUCOSE: 185 mg/dL — AB (ref 65–99)
POTASSIUM: 4 mmol/L (ref 3.5–5.1)
POTASSIUM: 4.2 mmol/L (ref 3.5–5.1)
Sodium: 139 mmol/L (ref 135–145)
Sodium: 140 mmol/L (ref 135–145)

## 2017-04-15 LAB — TROPONIN I

## 2017-04-15 LAB — I-STAT TROPONIN, ED: Troponin i, poc: 0 ng/mL (ref 0.00–0.08)

## 2017-04-15 LAB — CBG MONITORING, ED
Glucose-Capillary: 173 mg/dL — ABNORMAL HIGH (ref 65–99)
Glucose-Capillary: 137 mg/dL — ABNORMAL HIGH (ref 65–99)

## 2017-04-15 LAB — HEPATIC FUNCTION PANEL
ALT: 26 U/L (ref 14–54)
AST: 34 U/L (ref 15–41)
Albumin: 2.8 g/dL — ABNORMAL LOW (ref 3.5–5.0)
Alkaline Phosphatase: 81 U/L (ref 38–126)
BILIRUBIN INDIRECT: 0.6 mg/dL (ref 0.3–0.9)
Bilirubin, Direct: 0.3 mg/dL (ref 0.1–0.5)
Total Bilirubin: 0.9 mg/dL (ref 0.3–1.2)
Total Protein: 6.2 g/dL — ABNORMAL LOW (ref 6.5–8.1)

## 2017-04-15 LAB — MRSA PCR SCREENING: MRSA by PCR: NEGATIVE

## 2017-04-15 LAB — GLUCOSE, CAPILLARY
Glucose-Capillary: 138 mg/dL — ABNORMAL HIGH (ref 65–99)
Glucose-Capillary: 151 mg/dL — ABNORMAL HIGH (ref 65–99)

## 2017-04-15 LAB — CBC
HEMATOCRIT: 25.9 % — AB (ref 36.0–46.0)
Hemoglobin: 8.1 g/dL — ABNORMAL LOW (ref 12.0–15.0)
MCH: 23.9 pg — AB (ref 26.0–34.0)
MCHC: 31.3 g/dL (ref 30.0–36.0)
MCV: 76.4 fL — AB (ref 78.0–100.0)
Platelets: 224 10*3/uL (ref 150–400)
RBC: 3.39 MIL/uL — ABNORMAL LOW (ref 3.87–5.11)
RDW: 14.6 % (ref 11.5–15.5)
WBC: 9.1 10*3/uL (ref 4.0–10.5)

## 2017-04-15 LAB — PHOSPHORUS: PHOSPHORUS: 4.9 mg/dL — AB (ref 2.5–4.6)

## 2017-04-15 LAB — I-STAT CG4 LACTIC ACID, ED: LACTIC ACID, VENOUS: 1.31 mmol/L (ref 0.5–1.9)

## 2017-04-15 MED ORDER — ONDANSETRON HCL 4 MG/2ML IJ SOLN
4.0000 mg | Freq: Four times a day (QID) | INTRAMUSCULAR | Status: DC | PRN
  Administered 2017-04-15 – 2017-04-16 (×3): 4 mg via INTRAVENOUS
  Filled 2017-04-15 (×3): qty 2

## 2017-04-15 MED ORDER — PRAVASTATIN SODIUM 40 MG PO TABS
40.0000 mg | ORAL_TABLET | Freq: Every day | ORAL | Status: DC
  Administered 2017-04-15 – 2017-04-20 (×6): 40 mg via ORAL
  Filled 2017-04-15 (×6): qty 1

## 2017-04-15 MED ORDER — SODIUM CHLORIDE 0.9 % IV BOLUS: 1000.0000 mL | Freq: Once | INTRAVENOUS | Status: DC

## 2017-04-15 MED ORDER — ONDANSETRON HCL 4 MG PO TABS
4.0000 mg | ORAL_TABLET | Freq: Four times a day (QID) | ORAL | Status: DC | PRN
  Filled 2017-04-15: qty 1

## 2017-04-15 MED ORDER — GABAPENTIN 300 MG PO CAPS
300.0000 mg | ORAL_CAPSULE | Freq: Three times a day (TID) | ORAL | Status: DC
  Administered 2017-04-15 – 2017-04-20 (×15): 300 mg via ORAL
  Filled 2017-04-15 (×15): qty 1

## 2017-04-15 MED ORDER — IPRATROPIUM-ALBUTEROL 0.5-2.5 (3) MG/3ML IN SOLN
3.0000 mL | RESPIRATORY_TRACT | Status: DC | PRN
  Administered 2017-04-15: 3 mL via RESPIRATORY_TRACT
  Filled 2017-04-15: qty 3

## 2017-04-15 MED ORDER — SODIUM CHLORIDE 0.9 % IV BOLUS
500.0000 mL | Freq: Once | INTRAVENOUS | Status: AC
  Administered 2017-04-15: 500 mL via INTRAVENOUS

## 2017-04-15 MED ORDER — ACETAMINOPHEN 650 MG RE SUPP: 650.0000 mg | Freq: Four times a day (QID) | RECTAL | Status: DC | PRN

## 2017-04-15 MED ORDER — ENOXAPARIN SODIUM 100 MG/ML ~~LOC~~ SOLN
1.0000 mg/kg | Freq: Two times a day (BID) | SUBCUTANEOUS | Status: DC
  Administered 2017-04-15 – 2017-04-20 (×10): 95 mg via SUBCUTANEOUS
  Filled 2017-04-15 (×12): qty 1

## 2017-04-15 MED ORDER — MAGNESIUM SULFATE 50 % IJ SOLN
3.0000 g | Freq: Once | INTRAVENOUS | Status: AC
  Administered 2017-04-15: 3 g via INTRAVENOUS
  Filled 2017-04-15: qty 6

## 2017-04-15 MED ORDER — ENOXAPARIN SODIUM 100 MG/ML ~~LOC~~ SOLN
1.0000 mg/kg | Freq: Once | SUBCUTANEOUS | Status: AC
  Administered 2017-04-15: 95 mg via SUBCUTANEOUS
  Filled 2017-04-15: qty 1

## 2017-04-15 MED ORDER — INSULIN ASPART 100 UNIT/ML ~~LOC~~ SOLN
0.0000 [IU] | Freq: Three times a day (TID) | SUBCUTANEOUS | Status: DC
  Administered 2017-04-15 (×2): 3 [IU] via SUBCUTANEOUS
  Administered 2017-04-15 – 2017-04-16 (×2): 4 [IU] via SUBCUTANEOUS
  Administered 2017-04-16: 3 [IU] via SUBCUTANEOUS
  Administered 2017-04-16 – 2017-04-17 (×2): 4 [IU] via SUBCUTANEOUS
  Administered 2017-04-17: 3 [IU] via SUBCUTANEOUS
  Administered 2017-04-17: 4 [IU] via SUBCUTANEOUS
  Administered 2017-04-18: 7 [IU] via SUBCUTANEOUS
  Administered 2017-04-18 – 2017-04-19 (×4): 11 [IU] via SUBCUTANEOUS
  Administered 2017-04-19: 7 [IU] via SUBCUTANEOUS
  Administered 2017-04-20 (×2): 4 [IU] via SUBCUTANEOUS
  Filled 2017-04-15 (×2): qty 1

## 2017-04-15 MED ORDER — METOPROLOL TARTRATE 5 MG/5ML IV SOLN
5.0000 mg | INTRAVENOUS | Status: DC | PRN
  Administered 2017-04-15: 5 mg via INTRAVENOUS
  Filled 2017-04-15: qty 5

## 2017-04-15 MED ORDER — ACETAMINOPHEN 325 MG PO TABS
650.0000 mg | ORAL_TABLET | Freq: Four times a day (QID) | ORAL | Status: DC | PRN
  Administered 2017-04-15: 650 mg via ORAL
  Filled 2017-04-15: qty 2

## 2017-04-15 MED ORDER — DILTIAZEM LOAD VIA INFUSION
20.0000 mg | Freq: Once | INTRAVENOUS | Status: AC
  Administered 2017-04-15: 20 mg via INTRAVENOUS
  Filled 2017-04-15: qty 20

## 2017-04-15 MED ORDER — SODIUM CHLORIDE 0.9 % IV SOLN
INTRAVENOUS | Status: DC
  Administered 2017-04-15 – 2017-04-18 (×5): via INTRAVENOUS

## 2017-04-15 MED ORDER — HYDROCODONE-ACETAMINOPHEN 5-325 MG PO TABS
1.0000 | ORAL_TABLET | Freq: Four times a day (QID) | ORAL | Status: DC | PRN
  Administered 2017-04-15 – 2017-04-16 (×2): 2 via ORAL
  Filled 2017-04-15 (×2): qty 2

## 2017-04-15 MED ORDER — POLYETHYLENE GLYCOL 3350 17 G PO PACK: 17.0000 g | PACK | Freq: Every day | ORAL | Status: DC | PRN

## 2017-04-15 MED ORDER — LORAZEPAM 2 MG/ML IJ SOLN
1.0000 mg | INTRAMUSCULAR | Status: DC | PRN
  Administered 2017-04-15: 1 mg via INTRAVENOUS
  Filled 2017-04-15 (×2): qty 1

## 2017-04-15 MED ORDER — SODIUM CHLORIDE 0.9 % IV SOLN
INTRAVENOUS | Status: DC
  Administered 2017-04-15: 03:00:00 via INTRAVENOUS

## 2017-04-15 MED ORDER — CALCIUM GLUCONATE 10 % IV SOLN
1.0000 g | Freq: Once | INTRAVENOUS | Status: DC
  Filled 2017-04-15: qty 10

## 2017-04-15 MED ORDER — DILTIAZEM HCL-DEXTROSE 100-5 MG/100ML-% IV SOLN (PREMIX)
5.0000 mg/h | INTRAVENOUS | Status: DC
  Administered 2017-04-15: 15 mg/h via INTRAVENOUS
  Administered 2017-04-15: 5 mg/h via INTRAVENOUS
  Administered 2017-04-15 – 2017-04-16 (×3): 15 mg/h via INTRAVENOUS
  Filled 2017-04-15 (×6): qty 100

## 2017-04-15 NOTE — Progress Notes (Signed)
[place] TEAM 1 - Stepdown/ICU TEAM  Donna Booker  ZOX:[id number] DOB: 01-Sep-1945 DOA: 04/14/2017 PCP: System, Pcp Not In    Brief Narrative:  72 y.o. female with a hx of HTN and DM who presented to the ED w/ shortness of breath worsening for a few weeks.  She got to the point she couldnt walk 3 steps without becoming extremely winded and needing to stop.  In the ED she was found to be in atrial fibrillation with RVR, and was given a bolus of diltiazem and started on a diltiazem drip.    Significant Events: 4/14 admit 4/14 B LE venous duplex - no DVT   Subjective: Pt is seen for a f/u visit.    Assessment & Plan:  Newly diagnosed Atrial fibrillation with RVR CHA2DS2-VASC score is 4 - currently on Lovenox therapeutic dose - cont cardizem - check TSH - correct hypomagnesemia   DM - ucontrolled w/ peripheral neuropathy complication   Microcytic anemia  Check anemia panel - guaiac stools   HTN  Hyperlipidemia Continue statin  B LE Leg swelling Likely simply due to low CO in setting of Afib/RVR - no DVT on venous duplex   Obesity - Body mass index is 36.05 kg/m.   DVT prophylaxis: lovenox Code Status: FULL CODE Family Communication: no family present at time of exam  Disposition Plan: SDU  Consultants:  none  Antimicrobials:  none   Objective: Blood pressure (!) 158/85, pulse (!) 103, temperature 98.7 F (37.1 C), temperature source Oral, resp. rate (!) 23, height 5\' 4"  (1.626 m), weight 95.3 kg (210 lb), SpO2 99 %.  Intake/Output Summary (Last 24 hours) at 04/15/2017 1420 Last data filed at 04/15/2017 0223 Gross per 24 hour  Intake 500 ml  Output -  Net 500 ml   Filed Weights   04/15/17 0126  Weight: 95.3 kg (210 lb)    Examination: Pt is seen for a f/u exam  CBC: Recent Labs  Lab 04/15/17 0014 04/15/17 0452  WBC 9.5 9.1  NEUTROABS 3.8  --   HGB 8.7* 8.1*  HCT 27.6* 25.9*  MCV 75.4* 76.4*  PLT 240 224   Basic Metabolic Panel: Recent  Labs  Lab 04/15/17 0014 04/15/17 0452  NA 139 140  K 4.2 4.0  CL 109 109  CO2 20* 20*  GLUCOSE 168* 185*  BUN 22* 21*  CREATININE 0.82 0.84  CALCIUM 9.6 9.3  MG 1.5*  --   PHOS 4.9*  --    GFR: Estimated Creatinine Clearance: 70.7 mL/min (by C-G formula based on SCr of 0.84 mg/dL).  Liver Function Tests: Recent Labs  Lab 04/15/17 0014  AST 34  ALT 26  ALKPHOS 81  BILITOT 0.9  PROT 6.2*  ALBUMIN 2.8*    Cardiac Enzymes: Recent Labs  Lab 04/15/17 0452  TROPONINI <0.03    HbA1C: Hemoglobin A1C  Date/Time Value Ref Range Status  02/01/2016 04:40 PM 8.0  Final  07/29/2015 04:47 PM 8.4  Final   Hgb A1c MFr Bld  Date/Time Value Ref Range Status  10/20/2015 08:48 AM 8.3 (H) 4.6 - 6.5 % Final    Comment:    Glycemic Control Guidelines for People with Diabetes:Non Diabetic:  <6%Goal of Therapy: <7%Additional Action Suggested:  >8%   12/29/2014 04:49 PM 9.5 (H) 4.6 - 6.5 % Final    Comment:    Glycemic Control Guidelines for People with Diabetes:Non Diabetic:  <6%Goal of Therapy: <7%Additional Action Suggested:  >8%     CBG:  Recent Labs  Lab 04/15/17 0726 04/15/17 1157  GLUCAP 173* 137*     Scheduled Meds: . enoxaparin (LOVENOX) injection  1 mg/kg Subcutaneous BID  . insulin aspart  0-20 Units Subcutaneous TID WC   Continuous Infusions: . calcium gluconate 1 GM IVPB (Mini-Bag Plus)    . diltiazem (CARDIZEM) infusion 15 mg/hr (04/15/17 1353)     LOS: 0 days   [name], MD [facility] Office  [phone] Pager - Text Page per Amion as per below:  On-Call/Text Page:      [email or web address]      password [email or web address]  If 7PM-7AM, please contact night-coverage [email or web address] Password [email or web address] 04/15/2017, 2:20 PM

## 2017-04-15 NOTE — ED Notes (Signed)
Pt provided with meal tray at this time 

## 2017-04-15 NOTE — ED Notes (Signed)
Label sent to main lab to run hfp, mg, and phos blood test.

## 2017-04-15 NOTE — Progress Notes (Signed)
Charge RN called SWAT RN to come look at pt

## 2017-04-15 NOTE — Progress Notes (Signed)
Preliminary notes by tech--Bilateral lower extremities venous duplex study completed. Negative for deep and superficial veins thrombosis bilaterally. Left side groin-upper thigh region, a prominent lymph node noted. Result notified RN Kendal Hymen.   Hongying Astou Lada(RDMS RVT) 04/15/17 10:31 AM

## 2017-04-15 NOTE — Progress Notes (Signed)
Called Rt and will come for Duoneb tx.

## 2017-04-15 NOTE — H&P (Addendum)
[facility] History and Physical  Donna Booker SWH:[id number] DOB: 1945/08/19 DOA: 04/14/2017  Referring physician:  PCP: System, Pcp Not In   Chief Complaint: "I just got more short of breath."  HPI: Donna Booker is a 72 y.o. female with past medical history significant for hypertension and diabetes presents emergency room with chief complaint of shortness of breath.  Patient states that she has been short of breath few weeks.  It did not really escalate until the last 4 days or so.  No known trigger.  No recent illness.  No chest trauma.  Denies any feeling of racing heartbeat or chest pain.  Patient states she got to the point she couldnt walk 3 steps without becoming extremely winded and needing to stop.  Denies any history of heart issues.  No family history of heart attack.  No recent medication changes.  ED course: Patient found to be in atrial fibrillation with RVR.  Was given a bolus of diltiazem and started on diltiazem drip.  Hospitalist consulted for admission.  Patient did have brief drop in her blood pressure.  EDP gave patient a bolus of normal saline and calcium IV.  Blood pressure rebounded.   Review of Systems:  As per HPI otherwise 10 point review of systems negative.    Past Medical History:  Diagnosis Date  . Diabetes mellitus   . Hypertension    Past Surgical History:  Procedure Laterality Date  . BREAST SURGERY     Social History:  reports that she has never smoked. She has never used smokeless tobacco. She reports that she does not drink alcohol or use drugs.  Allergies  Allergen Reactions  . Tramadol Nausea And Vomiting    No family history on file.   Prior to Admission medications   Medication Sig Start Date End Date Taking? Authorizing Provider  amLODipine (NORVASC) 10 MG tablet Take 10 mg by mouth daily.    [provider]  aspirin EC 81 MG tablet Take 81 mg by mouth daily.    [provider]  diclofenac sodium  (VOLTAREN) 1 % GEL Apply 2 g topically 4 (four) times daily. 01/27/15   Myrlene Broker, MD  esomeprazole (NEXIUM) 40 MG capsule Take 40 mg by mouth daily before breakfast.    [provider]  gabapentin (NEURONTIN) 100 MG capsule Take 1-2 capsules (100-200 mg total) by mouth at bedtime. Patient not taking: Reported on 03/04/2017 03/03/16   Myrlene Broker, MD  gabapentin (NEURONTIN) 300 MG capsule Take 300 mg by mouth 3 (three) times daily. 02/22/17   [provider]  glipiZIDE (GLUCOTROL) 5 MG tablet Take 1 tablet (5 mg total) by mouth 2 (two) times daily before a meal. NEED ANNUAL VISIT FOR REFILLS Patient taking differently: Take 5 mg by mouth daily before breakfast. NEED ANNUAL VISIT FOR REFILLS 12/13/16   Myrlene Broker, MD  ibuprofen (ADVIL,MOTRIN) 200 MG tablet Take 400 mg by mouth every 6 (six) hours as needed for mild pain.    [provider]  Insulin Detemir (LEVEMIR) 100 UNIT/ML Pen Inject 35 Units into the skin daily at 10 pm. 06/05/16   Carlus Pavlov, MD  Liraglutide (VICTOZA) 18 MG/3ML SOPN Inject 0.3 mLs (1.8 mg total) into the skin daily before breakfast. Patient not taking: Reported on 03/04/2017 05/26/15   Myrlene Broker, MD  metFORMIN (GLUCOPHAGE) 1000 MG tablet TAKE 1 TABLET DAILY WITH SUPPER Patient taking differently: Take 1,000 mg by mouth 2 (two) times daily  with a meal.  01/07/16   Carlus Pavlov, MD  Olmesartan-Amlodipine-HCTZ [facility]) 40-10-25 MG TABS Take 1 tablet by mouth daily. Patient not taking: Reported on 03/04/2017 03/10/16   Myrlene Broker, MD  ondansetron (ZOFRAN-ODT) 4 MG disintegrating tablet Take 1 tablet (4 mg total) by mouth every 8 (eight) hours as needed for nausea or vomiting. Patient not taking: Reported on 03/04/2017 01/10/17   Benjiman Core, MD  pravastatin (PRAVACHOL) 40 MG tablet Take 1 tablet (40 mg total) by mouth daily. 10/28/15   Myrlene Broker, MD   Physical Exam: Vitals:    04/15/17 0000 04/15/17 0002 04/15/17 0004 04/15/17 0015  BP: (!) 153/90 (!) 153/90  (!) 158/107  Pulse: (!) 140  (!) 126 72  Resp: 14   (!) 28  Temp: 98.7 F (37.1 C)     TempSrc: Oral     SpO2: 100%  100% 100%    Wt Readings from Last 3 Encounters:  03/04/17 103 kg (227 lb)  01/10/17 101.6 kg (224 lb)  03/03/16 103.4 kg (228 lb)    General:  Appears calm and comfortable; A&Ox3 Eyes:  PERRL, EOMI, normal lids, iris ENT:  grossly normal hearing, lips & tongue Neck:  no LAD, masses or thyromegaly Cardiovascular:  IRR IRR, no m/r/g. No LE edema.  Respiratory:  CTA bilaterally, no w/r/r. Normal respiratory effort. Abdomen:  soft, ntnd Skin:  no rash or induration seen on limited exam Musculoskeletal:  grossly normal tone BUE/BLE Psychiatric:  grossly normal mood and affect, speech fluent and appropriate Neurologic:  CN 2-12 grossly intact, moves all extremities in coordinated fashion.          Labs on Admission:  Basic Metabolic Panel: Recent Labs  Lab 04/15/17 0014  NA 139  K 4.2  CL 109  CO2 20*  GLUCOSE 168*  BUN 22*  CREATININE 0.82  CALCIUM 9.6   Liver Function Tests: No results for input(s): AST, ALT, ALKPHOS, BILITOT, PROT, ALBUMIN in the last 168 hours. No results for input(s): LIPASE, AMYLASE in the last 168 hours. No results for input(s): AMMONIA in the last 168 hours. CBC: Recent Labs  Lab 04/15/17 0014  WBC 9.5  NEUTROABS 3.8  HGB 8.7*  HCT 27.6*  MCV 75.4*  PLT 240   Cardiac Enzymes: No results for input(s): CKTOTAL, CKMB, CKMBINDEX, TROPONINI in the last 168 hours.  BNP (last 3 results) Recent Labs    03/04/17 0934  BNP 216.3*    ProBNP (last 3 results) No results for input(s): PROBNP in the last 8760 hours.   Creatinine clearance cannot be calculated (Unknown ideal weight.)  CBG: No results for input(s): GLUCAP in the last 168 hours.  Radiological Exams on Admission: Dg Chest Port 1 View  Result Date: 04/15/2017 CLINICAL  DATA:  Shortness of breath.  Productive cough. EXAM: PORTABLE CHEST 1 VIEW COMPARISON:  Radiographs and CT 03/04/2017 FINDINGS: Lower lung volumes from prior exam with bronchovascular crowding. Upper normal heart size with mild aortic atherosclerosis. Trace fluid in the right minor fissure. Minimal right infrahilar atelectasis. No confluent consolidation, pleural effusion or pneumothorax. No acute osseous abnormalities. IMPRESSION: Lower lung volumes from prior exam with bronchovascular crowding. Upper normal heart size with aortic atherosclerosis. Electronically Signed   By: Rubye Oaks M.D.   On: 04/15/2017 00:58    EKG: Independently reviewed.  SVT, no STEMI.  Assessment/Plan Active Problems:   Atrial fibrillation with RVR (HCC)   Atrial fibrillation with RVR CHA2DS2-VASC score 4 Starting patient on Lovenox therapeutic  dose Review other options in the morning Echo in the morning Repeat troponin Check electrolytes replace as necessary Consider cardiology consult  Diabetes Sliding scale insulin before meals Hold oral medications Continue Levemir  HTN Continue home blood pressure medications  Hyperlipidemia Continue statin  Leg swelling Checking for DVT  Chronic pain Continue Neurontin  Code Status: FC  DVT Prophylaxis: lovenox Family Communication: none available Disposition Plan: Pending Improvement  Status: inpt sdu  Haydee Salter, MD Family Medicine [facility] [email or web address] Password [email or web address]

## 2017-04-15 NOTE — ED Notes (Signed)
Dr. McClung bedside at this time 

## 2017-04-15 NOTE — ED Provider Notes (Addendum)
[facility] EMERGENCY DEPARTMENT Provider Note   CSN: [id number] Arrival date & time: 04/14/17  2353     History   Chief Complaint Chief Complaint  Patient presents with  . Shortness of Breath  . Atrial Fibrillation    HPI Donna Booker is a 72 y.o. female.  Patient with PMH of DM and HTN presents to the ED with a chief complaint of SOB.  She states that she has been having SOB for the past month or so.  She reports that she has been having gradually worsening symptoms, but significantly worsened today.  EMS was called and she was brought to the ED.  She states that she has had some cough.  She is not anticoagulated.  She was given a breathing treatment and 5mg  of metoprolol by EMS.  She denies CP or fever.  The history is provided by the patient. No language interpreter was used.    Past Medical History:  Diagnosis Date  . Diabetes mellitus   . Hypertension     Patient Active Problem List   Diagnosis Date Noted  . Routine general medical examination at a health care facility 10/20/2015  . Uncontrolled type 2 diabetes mellitus with diabetic polyneuropathy, with long-term current use of insulin (HCC) 02/25/2015  . Right foot pain 01/28/2015  . Hyperlipidemia 09/02/2014  . Morbid obesity (HCC) 09/02/2014  . HTN (hypertension) 10/04/2012    Past Surgical History:  Procedure Laterality Date  . BREAST SURGERY       OB History   None      Home Medications    Prior to Admission medications   Medication Sig Start Date End Date Taking? Authorizing Provider  amLODipine (NORVASC) 10 MG tablet Take 10 mg by mouth daily.    [provider]  aspirin EC 81 MG tablet Take 81 mg by mouth daily.    [provider]  diclofenac sodium (VOLTAREN) 1 % GEL Apply 2 g topically 4 (four) times daily. 01/27/15   Myrlene Broker, MD  esomeprazole (NEXIUM) 40 MG capsule Take 40 mg by mouth daily before breakfast.    [provider]    gabapentin (NEURONTIN) 100 MG capsule Take 1-2 capsules (100-200 mg total) by mouth at bedtime. Patient not taking: Reported on 03/04/2017 03/03/16   Myrlene Broker, MD  gabapentin (NEURONTIN) 300 MG capsule Take 300 mg by mouth 3 (three) times daily. 02/22/17   [provider]  glipiZIDE (GLUCOTROL) 5 MG tablet Take 1 tablet (5 mg total) by mouth 2 (two) times daily before a meal. NEED ANNUAL VISIT FOR REFILLS Patient taking differently: Take 5 mg by mouth daily before breakfast. NEED ANNUAL VISIT FOR REFILLS 12/13/16   Myrlene Broker, MD  ibuprofen (ADVIL,MOTRIN) 200 MG tablet Take 400 mg by mouth every 6 (six) hours as needed for mild pain.    [provider]  Insulin Detemir (LEVEMIR) 100 UNIT/ML Pen Inject 35 Units into the skin daily at 10 pm. 06/05/16   Carlus Pavlov, MD  Liraglutide (VICTOZA) 18 MG/3ML SOPN Inject 0.3 mLs (1.8 mg total) into the skin daily before breakfast. Patient not taking: Reported on 03/04/2017 05/26/15   Myrlene Broker, MD  metFORMIN (GLUCOPHAGE) 1000 MG tablet TAKE 1 TABLET DAILY WITH SUPPER Patient taking differently: Take 1,000 mg by mouth 2 (two) times daily with a meal.  01/07/16   Carlus Pavlov, MD  Olmesartan-Amlodipine-HCTZ (TRIBENZOR) 40-10-25 MG TABS Take 1 tablet by mouth daily. Patient not taking: Reported  on 03/04/2017 03/10/16   Myrlene Broker, MD  ondansetron (ZOFRAN-ODT) 4 MG disintegrating tablet Take 1 tablet (4 mg total) by mouth every 8 (eight) hours as needed for nausea or vomiting. Patient not taking: Reported on 03/04/2017 01/10/17   Benjiman Core, MD  pravastatin (PRAVACHOL) 40 MG tablet Take 1 tablet (40 mg total) by mouth daily. 10/28/15   Myrlene Broker, MD    Family History No family history on file.  Social History Social History   Tobacco Use  . Smoking status: Never Smoker  . Smokeless tobacco: Never Used  Substance Use Topics  . Alcohol use: No  . Drug use: No     Allergies    Tramadol   Review of Systems Review of Systems  All other systems reviewed and are negative.    Physical Exam Updated Vital Signs BP (!) 153/90   Pulse (!) 126   Temp 98.7 F (37.1 C) (Oral)   Resp 14   SpO2 100%   Physical Exam  Constitutional: She is oriented to person, place, and time. She appears well-developed and well-nourished.  HENT:  Head: Normocephalic and atraumatic.  Eyes: Pupils are equal, round, and reactive to light. Conjunctivae and EOM are normal.  Neck: Normal range of motion. Neck supple.  Cardiovascular: Exam reveals no gallop and no friction rub.  No murmur heard. Tachycardic and irregularly irregular  Pulmonary/Chest: Effort normal and breath sounds normal. No respiratory distress. She has no wheezes. She has no rales. She exhibits swelling. She exhibits no tenderness.  Abdominal: Soft. Bowel sounds are normal. She exhibits no distension and no mass. There is no tenderness. There is no rebound and no guarding.  Musculoskeletal: Normal range of motion. She exhibits no edema or tenderness.  Mild swelling to right lower extremity  Neurological: She is alert and oriented to person, place, and time.  Skin: Skin is warm and dry.  Psychiatric: She has a normal mood and affect. Her behavior is normal. Judgment and thought content normal.  Nursing note and vitals reviewed.    ED Treatments / Results  Labs (all labs ordered are listed, but only abnormal results are displayed) Labs Reviewed  CBC WITH DIFFERENTIAL/PLATELET  BASIC METABOLIC PANEL  I-STAT TROPONIN, ED    EKG EKG Interpretation  Date/Time:  Sunday April 15 2017 00:05:57 EDT Ventricular Rate:  139 PR Interval:    QRS Duration: 85 QT Interval:  301 QTC Calculation: 458 R Axis:   17 Text Interpretation:  Atrial flutter Low voltage, precordial leads Baseline wander in lead(s) I III aVL Otherwise no significant change Confirmed by Melene Plan [phone]) on 04/15/2017 12:14:12  AM   Radiology Dg Chest Port 1 View  Result Date: 04/15/2017 CLINICAL DATA:  Shortness of breath.  Productive cough. EXAM: PORTABLE CHEST 1 VIEW COMPARISON:  Radiographs and CT 03/04/2017 FINDINGS: Lower lung volumes from prior exam with bronchovascular crowding. Upper normal heart size with mild aortic atherosclerosis. Trace fluid in the right minor fissure. Minimal right infrahilar atelectasis. No confluent consolidation, pleural effusion or pneumothorax. No acute osseous abnormalities. IMPRESSION: Lower lung volumes from prior exam with bronchovascular crowding. Upper normal heart size with aortic atherosclerosis. Electronically Signed   By: Rubye Oaks M.D.   On: 04/15/2017 00:58    Procedures Procedures (including critical care time) CRITICAL CARE Performed by: Roxy Horseman   Total critical care time: 41 minutes  Critical care time was exclusive of separately billable procedures and treating other patients.  Critical care was necessary to treat  or prevent imminent or life-threatening deterioration.  Critical care was time spent personally by me on the following activities: development of treatment plan with patient and/or surrogate as well as nursing, discussions with consultants, evaluation of patient's response to treatment, examination of patient, obtaining history from patient or surrogate, ordering and performing treatments and interventions, ordering and review of laboratory studies, ordering and review of radiographic studies, pulse oximetry and re-evaluation of patient's condition.  Medications Ordered in ED Medications  diltiazem (CARDIZEM) 1 mg/mL load via infusion 20 mg (has no administration in time range)    And  diltiazem (CARDIZEM) 100 mg in dextrose 5% (1 mg/mL) infusion (has no administration in time range)     Initial Impression / Assessment and Plan / ED Course  I have reviewed the triage vital signs and the nursing notes.  Pertinent labs &  imaging results that were available during my care of the patient were reviewed by me and considered in my medical decision making (see chart for details).     Patient with SOB.  Has been short of breath for about a month and getting worse. EKG shows aflutter with RVR.  Rates in the 120-140s.  No hx of the same.  Not anticoagulated.  Unclear onset given SOB x 1 month.  Discussed with Dr. Adela Lank, will start cardizem bolus and infusion.  CHA2DS2-VASc score is 4.  Will need lower extremity US to rule out DVTs in the AM.  Rate improving, now in the 90s to low 100s.  BP improved.  Patient seen by and discussed with Dr. Adela Lank, who agrees with plan.  Appreciate [facility] for admitting the patient.  1:23 AM Notified by RN that pressure has dropped.  Cardizem running at 5 mg/hr.  Discussed with Dr. Adela Lank, who recommends giving 1 g calcium gluconate over 1 hour and 500 mL NS bolus.   Updated TRH.  Final Clinical Impressions(s) / ED Diagnoses   Final diagnoses:  Atrial fibrillation with RVR (HCC)  Right leg swelling  SOB (shortness of breath)    ED Discharge Orders    None          Roxy Horseman, PA-C 04/15/17 0125    Melene Plan, DO 04/15/17 [phone]

## 2017-04-15 NOTE — ED Notes (Signed)
ED Provider at bedside. 

## 2017-04-15 NOTE — ED Notes (Signed)
Ordered diet tray for pt  

## 2017-04-15 NOTE — Progress Notes (Signed)
6E 24 anxious, restless, complaining difficulty breathing, asking for an anxiety med and breathing mask, asking for stronger med than Tylenol for R ankle pain 7-8/10 constant, nauseated, throwing up, c/o she cant breath.    MD responded and will place new meds on mAR

## 2017-04-16 ENCOUNTER — Inpatient Hospital Stay (HOSPITAL_COMMUNITY): Payer: BLUE CROSS/BLUE SHIELD

## 2017-04-16 DIAGNOSIS — I340 Nonrheumatic mitral (valve) insufficiency: Secondary | ICD-10-CM

## 2017-04-16 DIAGNOSIS — E0590 Thyrotoxicosis, unspecified without thyrotoxic crisis or storm: Secondary | ICD-10-CM

## 2017-04-16 DIAGNOSIS — I361 Nonrheumatic tricuspid (valve) insufficiency: Secondary | ICD-10-CM

## 2017-04-16 LAB — COMPREHENSIVE METABOLIC PANEL
ALBUMIN: 2.9 g/dL — AB (ref 3.5–5.0)
ALK PHOS: 76 U/L (ref 38–126)
ALT: 25 U/L (ref 14–54)
AST: 27 U/L (ref 15–41)
Anion gap: 11 (ref 5–15)
BILIRUBIN TOTAL: 0.6 mg/dL (ref 0.3–1.2)
BUN: 27 mg/dL — ABNORMAL HIGH (ref 6–20)
CALCIUM: 9.5 mg/dL (ref 8.9–10.3)
CO2: 20 mmol/L — AB (ref 22–32)
CREATININE: 1.4 mg/dL — AB (ref 0.44–1.00)
Chloride: 110 mmol/L (ref 101–111)
GFR calc non Af Amer: 37 mL/min — ABNORMAL LOW (ref >60)
GFR, EST AFRICAN AMERICAN: 43 mL/min — AB (ref >60)
GLUCOSE: 129 mg/dL — AB (ref 65–99)
Potassium: 3.9 mmol/L (ref 3.5–5.1)
Sodium: 141 mmol/L (ref 135–145)
TOTAL PROTEIN: 6.4 g/dL — AB (ref 6.5–8.1)

## 2017-04-16 LAB — TSH: TSH: <0.01 u[IU]/mL — ABNORMAL LOW (ref 0.350–4.500)

## 2017-04-16 LAB — CBC
HEMATOCRIT: 26.3 % — AB (ref 36.0–46.0)
Hemoglobin: 8 g/dL — ABNORMAL LOW (ref 12.0–15.0)
MCH: 23.4 pg — AB (ref 26.0–34.0)
MCHC: 30.4 g/dL (ref 30.0–36.0)
MCV: 76.9 fL — ABNORMAL LOW (ref 78.0–100.0)
Platelets: 233 10*3/uL (ref 150–400)
RBC: 3.42 MIL/uL — AB (ref 3.87–5.11)
RDW: 14.8 % (ref 11.5–15.5)
WBC: 12.6 10*3/uL — ABNORMAL HIGH (ref 4.0–10.5)

## 2017-04-16 LAB — GLUCOSE, CAPILLARY
GLUCOSE-CAPILLARY: 153 mg/dL — AB (ref 65–99)
Glucose-Capillary: 122 mg/dL — ABNORMAL HIGH (ref 65–99)
Glucose-Capillary: 135 mg/dL — ABNORMAL HIGH (ref 65–99)
Glucose-Capillary: 153 mg/dL — ABNORMAL HIGH (ref 65–99)
Glucose-Capillary: 136 mg/dL — ABNORMAL HIGH (ref 65–99)

## 2017-04-16 LAB — FERRITIN: Ferritin: 579 ng/mL — ABNORMAL HIGH (ref 11–307)

## 2017-04-16 LAB — RETICULOCYTES
RBC.: 3.42 MIL/uL — AB (ref 3.87–5.11)
Retic Count, Absolute: 95.8 10*3/uL (ref 19.0–186.0)
Retic Ct Pct: 2.8 % (ref 0.4–3.1)

## 2017-04-16 LAB — IRON AND TIBC
Iron: 29 ug/dL (ref 28–170)
Saturation Ratios: 13 % (ref 10.4–31.8)
TIBC: 217 ug/dL — ABNORMAL LOW (ref 250–450)
UIBC: 188 ug/dL

## 2017-04-16 LAB — ECHOCARDIOGRAM COMPLETE
Height: 64 in
Weight: 3355.2 oz

## 2017-04-16 LAB — T4, FREE: Free T4: 5.15 ng/dL — ABNORMAL HIGH (ref 0.61–1.12)

## 2017-04-16 LAB — FOLATE: FOLATE: 28 ng/mL (ref >5.9)

## 2017-04-16 LAB — VITAMIN B12: VITAMIN B 12: 1085 pg/mL — AB (ref 180–914)

## 2017-04-16 LAB — MAGNESIUM: MAGNESIUM: 2.2 mg/dL (ref 1.7–2.4)

## 2017-04-16 MED ORDER — ATENOLOL 25 MG PO TABS
25.0000 mg | ORAL_TABLET | Freq: Two times a day (BID) | ORAL | Status: DC
  Administered 2017-04-16 – 2017-04-17 (×3): 25 mg via ORAL
  Filled 2017-04-16 (×3): qty 1

## 2017-04-16 MED ORDER — METHIMAZOLE 10 MG PO TABS
20.0000 mg | ORAL_TABLET | Freq: Every day | ORAL | Status: DC
  Administered 2017-04-16 – 2017-04-20 (×5): 20 mg via ORAL
  Filled 2017-04-16 (×5): qty 2

## 2017-04-16 MED ORDER — LEVALBUTEROL HCL 0.63 MG/3ML IN NEBU
0.6300 mg | INHALATION_SOLUTION | RESPIRATORY_TRACT | Status: DC | PRN
  Administered 2017-04-16 – 2017-04-18 (×4): 0.63 mg via RESPIRATORY_TRACT
  Filled 2017-04-16 (×4): qty 3

## 2017-04-16 MED ORDER — DILTIAZEM HCL 60 MG PO TABS
30.0000 mg | ORAL_TABLET | Freq: Three times a day (TID) | ORAL | Status: DC
  Administered 2017-04-16: 30 mg via ORAL
  Filled 2017-04-16: qty 1

## 2017-04-16 NOTE — Progress Notes (Signed)
  Echocardiogram 2D Echocardiogram has been performed.  Donna Booker 04/16/2017, 3:16 PM

## 2017-04-16 NOTE — Progress Notes (Signed)
When patient returned to bed from using the [facility], she starting getting diaphoretic. Patient stated that she needed to vomit and "felt like she was about to die". Patient on tele showed HR decrease to 30s and had a >3 second pause. Patient had presyncopal episode during this time. When she regained full consciousness, she vomited. Patient received IV zofran and PRN neb. tx for feeling SOB. Dr. Sharon Seller paged and aware. Orders to D/C cardizem PO. Will continue to monitor closely.

## 2017-04-16 NOTE — Progress Notes (Signed)
[place] TEAM 1 - Stepdown/ICU TEAM  Donna Booker  EZM:[id number] DOB: 01-01-1946 DOA: 04/14/2017 PCP: System, Pcp Not In    Brief Narrative:  72 y.o. female with a hx of HTN and DM who presented to the ED w/ shortness of breath worsening for a few weeks.  She got to the point she couldnt walk 3 steps without becoming extremely winded and needing to stop.  In the ED she was found to be in atrial fibrillation with RVR, and was given a bolus of diltiazem and started on a diltiazem drip.    Significant Events: 4/14 admit 4/14 B LE venous duplex - no DVT   Subjective: The pt has been found to be markedly hyperthyroid.  Tx for this has been initiated.  She admits to feeling tremulous, anxious, and a bit agitated.  She denies chest pain, a sense of palpitations, shortness of breath, nausea or vomiting.  She is not aware of any prior history of thyroid issues.  Assessment & Plan:  Newly diagnosed Atrial fibrillation with RVR CHA2DS2-VASC score is 4 - currently on Lovenox therapeutic dose - appears to be driven by severe hyperthyroidism - transition to BB - corrected hypomagnesemia - no need for Cards eval at this time as pt would not be a candidate for cardioversion until her thryoid issue is corrected   Hyperthyroidism TSH is maximally suppressed and FT4 is markedly elevated at this time - no clinical sx to suggest thyroid storm - dose w/ BB and tapazole - checking thyroid stimulating immunoglobulin and FT3   Acute kidney injury Likely due to decreased CO in setting of RVR - keep hydrated - follow trend   Recent Labs  Lab 04/15/17 0014 04/15/17 0452 04/16/17 0427  CREATININE 0.82 0.84 1.40*    DM - ucontrolled w/ peripheral neuropathy complication  CBG reasonable at this time   Microcytic anemia  Ferritin and Fe studies not c/w Fe deficiency - folate and B12 normal - guaiac stools   HTN BP trending upward - adjust tx and follow - BB being added  Hyperlipidemia Continue  statin  B LE Leg swelling Likely simply due to low CO in setting of Afib/RVR - no DVT on venous duplex   Obesity - Body mass index is 35.99 kg/m.   DVT prophylaxis: lovenox Code Status: FULL CODE Family Communication: no family present at time of exam  Disposition Plan: SDU  Consultants:  none  Antimicrobials:  none   Objective: Blood pressure (!) 146/75, pulse 78, temperature 98.1 F (36.7 C), temperature source Oral, resp. rate 20, height 5\' 4"  (1.626 m), weight 95.1 kg (209 lb 11.2 oz), SpO2 100 %.  Intake/Output Summary (Last 24 hours) at 04/16/2017 1210 Last data filed at 04/16/2017 1100 Gross per 24 hour  Intake 2298.17 ml  Output 101 ml  Net 2197.17 ml   Filed Weights   04/15/17 0126 04/16/17 0407  Weight: 95.3 kg (210 lb) 95.1 kg (209 lb 11.2 oz)    Examination: General: No acute respiratory distress Neck:  No palpable thyroid to my exam  Lungs: Clear to auscultation bilaterally without wheezes or crackles Cardiovascular: Irreg irreg - rate controlled - no M or rub  Abdomen: Nontender, nondistended, soft, bowel sounds positive, no rebound, no ascites, no appreciable mass Extremities: No significant cyanosis, clubbing, or edema bilateral lower extremities   CBC: Recent Labs  Lab 04/15/17 0014 04/15/17 0452 04/16/17 0427  WBC 9.5 9.1 12.6*  NEUTROABS 3.8  --   --  HGB 8.7* 8.1* 8.0*  HCT 27.6* 25.9* 26.3*  MCV 75.4* 76.4* 76.9*  PLT 240 224 233   Basic Metabolic Panel: Recent Labs  Lab 04/15/17 0014 04/15/17 0452 04/16/17 0427  NA 139 140 141  K 4.2 4.0 3.9  CL 109 109 110  CO2 20* 20* 20*  GLUCOSE 168* 185* 129*  BUN 22* 21* 27*  CREATININE 0.82 0.84 1.40*  CALCIUM 9.6 9.3 9.5  MG 1.5*  --   --   PHOS 4.9*  --   --    GFR: Estimated Creatinine Clearance: 42.4 mL/min (A) (by C-G formula based on SCr of 1.4 mg/dL (H)).  Liver Function Tests: Recent Labs  Lab 04/15/17 0014 04/16/17 0427  AST 34 27  ALT 26 25  ALKPHOS 81 76    BILITOT 0.9 0.6  PROT 6.2* 6.4*  ALBUMIN 2.8* 2.9*    Cardiac Enzymes: Recent Labs  Lab 04/15/17 0452  TROPONINI <0.03    HbA1C: Hemoglobin A1C  Date/Time Value Ref Range Status  02/01/2016 04:40 PM 8.0  Final  07/29/2015 04:47 PM 8.4  Final   Hgb A1c MFr Bld  Date/Time Value Ref Range Status  10/20/2015 08:48 AM 8.3 (H) 4.6 - 6.5 % Final    Comment:    Glycemic Control Guidelines for People with Diabetes:Non Diabetic:  <6%Goal of Therapy: <7%Additional Action Suggested:  >8%   12/29/2014 04:49 PM 9.5 (H) 4.6 - 6.5 % Final    Comment:    Glycemic Control Guidelines for People with Diabetes:Non Diabetic:  <6%Goal of Therapy: <7%Additional Action Suggested:  >8%     CBG: Recent Labs  Lab 04/15/17 1744 04/15/17 2115 04/16/17 0420 04/16/17 0753 04/16/17 1126  GLUCAP 138* 151* 122* 135* 153*     Scheduled Meds: . enoxaparin (LOVENOX) injection  1 mg/kg Subcutaneous BID  . gabapentin  300 mg Oral TID  . insulin aspart  0-20 Units Subcutaneous TID WC  . pravastatin  40 mg Oral Daily   Continuous Infusions: . sodium chloride 75 mL/hr at 04/16/17 1157  . diltiazem (CARDIZEM) infusion 10 mg/hr (04/16/17 1157)     LOS: 1 day   [name], MD [facility] Office  [phone] Pager - Text Page per Amion as per below:  On-Call/Text Page:      [email or web address]      password [email or web address]  If 7PM-7AM, please contact night-coverage [email or web address] Password [email or web address] 04/16/2017, 12:10 PM

## 2017-04-17 LAB — PHOSPHORUS: PHOSPHORUS: 5.7 mg/dL — AB (ref 2.5–4.6)

## 2017-04-17 LAB — MAGNESIUM: Magnesium: 2.2 mg/dL (ref 1.7–2.4)

## 2017-04-17 LAB — CBC
HEMATOCRIT: 28.9 % — AB (ref 36.0–46.0)
HEMOGLOBIN: 8.4 g/dL — AB (ref 12.0–15.0)
MCH: 22.5 pg — ABNORMAL LOW (ref 26.0–34.0)
MCHC: 29.1 g/dL — ABNORMAL LOW (ref 30.0–36.0)
MCV: 77.5 fL — ABNORMAL LOW (ref 78.0–100.0)
Platelets: 207 10*3/uL (ref 150–400)
RBC: 3.73 MIL/uL — AB (ref 3.87–5.11)
RDW: 14.6 % (ref 11.5–15.5)
WBC: 12.2 10*3/uL — AB (ref 4.0–10.5)

## 2017-04-17 LAB — COMPREHENSIVE METABOLIC PANEL
ALT: 26 U/L (ref 14–54)
ANION GAP: 11 (ref 5–15)
AST: 27 U/L (ref 15–41)
Albumin: 3 g/dL — ABNORMAL LOW (ref 3.5–5.0)
Alkaline Phosphatase: 75 U/L (ref 38–126)
BILIRUBIN TOTAL: 0.8 mg/dL (ref 0.3–1.2)
BUN: 37 mg/dL — ABNORMAL HIGH (ref 6–20)
CO2: 18 mmol/L — ABNORMAL LOW (ref 22–32)
Calcium: 9.5 mg/dL (ref 8.9–10.3)
Chloride: 109 mmol/L (ref 101–111)
Creatinine, Ser: 1.53 mg/dL — ABNORMAL HIGH (ref 0.44–1.00)
GFR calc Af Amer: 39 mL/min — ABNORMAL LOW (ref >60)
GFR, EST NON AFRICAN AMERICAN: 34 mL/min — AB (ref >60)
Glucose, Bld: 146 mg/dL — ABNORMAL HIGH (ref 65–99)
POTASSIUM: 4.4 mmol/L (ref 3.5–5.1)
Sodium: 138 mmol/L (ref 135–145)
TOTAL PROTEIN: 6.5 g/dL (ref 6.5–8.1)

## 2017-04-17 LAB — GLUCOSE, CAPILLARY
GLUCOSE-CAPILLARY: 148 mg/dL — AB (ref 65–99)
GLUCOSE-CAPILLARY: 164 mg/dL — AB (ref 65–99)
GLUCOSE-CAPILLARY: 178 mg/dL — AB (ref 65–99)
Glucose-Capillary: 291 mg/dL — ABNORMAL HIGH (ref 65–99)

## 2017-04-17 LAB — T3, FREE: T3, Free: 18.5 pg/mL — ABNORMAL HIGH (ref 2.0–4.4)

## 2017-04-17 LAB — CORTISOL: CORTISOL PLASMA: 19.1 ug/dL

## 2017-04-17 LAB — THYROID STIMULATING IMMUNOGLOBULIN: THYROID STIMULATING IMMUNOGLOB: 8.02 IU/L — AB (ref 0.00–0.55)

## 2017-04-17 MED ORDER — ATENOLOL 25 MG PO TABS
50.0000 mg | ORAL_TABLET | Freq: Two times a day (BID) | ORAL | Status: DC
  Administered 2017-04-17 – 2017-04-20 (×6): 50 mg via ORAL
  Filled 2017-04-17 (×6): qty 2

## 2017-04-17 MED ORDER — ORAL CARE MOUTH RINSE
15.0000 mL | Freq: Two times a day (BID) | OROMUCOSAL | Status: DC
  Administered 2017-04-17 – 2017-04-20 (×4): 15 mL via OROMUCOSAL

## 2017-04-17 MED ORDER — PANTOPRAZOLE SODIUM 40 MG PO TBEC
40.0000 mg | DELAYED_RELEASE_TABLET | Freq: Every day | ORAL | Status: DC
  Administered 2017-04-18 – 2017-04-20 (×3): 40 mg via ORAL
  Filled 2017-04-17 (×3): qty 1

## 2017-04-17 MED ORDER — LEVALBUTEROL HCL 0.63 MG/3ML IN NEBU
0.6300 mg | INHALATION_SOLUTION | Freq: Three times a day (TID) | RESPIRATORY_TRACT | Status: DC
  Administered 2017-04-17 – 2017-04-19 (×5): 0.63 mg via RESPIRATORY_TRACT
  Filled 2017-04-17 (×5): qty 3

## 2017-04-17 MED ORDER — HYDROCORTISONE NA SUCCINATE PF 100 MG IJ SOLR
100.0000 mg | Freq: Three times a day (TID) | INTRAMUSCULAR | Status: DC
  Administered 2017-04-17 – 2017-04-19 (×6): 100 mg via INTRAVENOUS
  Filled 2017-04-17 (×6): qty 2

## 2017-04-17 MED ORDER — SODIUM CHLORIDE 0.9 % IV BOLUS
250.0000 mL | Freq: Once | INTRAVENOUS | Status: AC
  Administered 2017-04-17: 250 mL via INTRAVENOUS

## 2017-04-17 NOTE — Progress Notes (Signed)
[place] TEAM 1 - Stepdown/ICU TEAM  ANDREEA ARCA  KZL:[id number] DOB: 06-Mar-1945 DOA: 04/14/2017 PCP: System, Pcp Not In    Brief Narrative:  72 y.o. female with a hx of HTN and DM who presented to the ED w/ shortness of breath worsening for a few weeks.  She got to the point she couldnt walk 3 steps without becoming extremely winded and needing to stop.  In the ED she was found to be in atrial fibrillation with RVR, and was given a bolus of diltiazem and started on a diltiazem drip.    Significant Events: 4/14 admit 4/14 B LE venous duplex - no DVT   Subjective: The patient reports that she continues to feel somewhat tremulous anxious and mildly agitated.  She feels short of breath.  She denies chest pain nausea vomiting or abdominal pain.  She had an episode yesterday of presyncope that coincided with significant bradycardia felt to be related to the combination of a calcium channel blocker and beta-blocker therapy.  Assessment & Plan:  Newly diagnosed Atrial fibrillation with RVR CHA2DS2-VASC score is 4 - currently on Lovenox therapeutic dose - appears to be driven by severe hyperthyroidism - transitioned to BB - corrected hypomagnesemia - no need for Cards eval at this time as pt would not be a candidate for cardioversion until her thryoid issue is corrected   Hyperthyroidism TSH is maximally suppressed and FT4 and FT3 are markedly elevated  - no clinical sx to suggest thyroid storm - dosing w/ BB and tapazole - thyroid stimulating immunoglobulin pending - increasing dose of BB today, and adding hydrocortisone for planned short course while awaiting stabilization of thyroid state    Acute kidney injury Likely due to decreased CO in setting of RVR - keep hydrated - recheck renal fxn in AM - having consistent UOP w/o evidence of retention   Recent Labs  Lab 04/15/17 0014 04/15/17 0452 04/16/17 0427 04/17/17 0756  CREATININE 0.82 0.84 1.40* 1.53*    DM - ucontrolled w/  peripheral neuropathy complication  CBG reasonable at this time - anticipate climb in CBGs w/ addition of hydrocortisone   Microcytic anemia  Ferritin and Fe studies not c/w Fe deficiency - folate and B12 normal - guaiac stools pending   HTN BP trending upward - BB increased today - follow  Hyperlipidemia Continue statin  B LE Leg swelling Likely simply due to low CO in setting of Afib/RVR - no DVT on venous duplex - improved on exam today   Obesity - Body mass index is 36.58 kg/m.   DVT prophylaxis: lovenox Code Status: FULL CODE Family Communication: no family present at time of exam  Disposition Plan: SDU  Consultants:  none  Antimicrobials:  none   Objective: Blood pressure (!) 117/100, pulse (!) 112, temperature 98.1 F (36.7 C), temperature source Oral, resp. rate 18, height 5\' 4"  (1.626 m), weight 96.7 kg (213 lb 1.6 oz), SpO2 98 %.  Intake/Output Summary (Last 24 hours) at 04/17/2017 1642 Last data filed at 04/17/2017 0601 Gross per 24 hour  Intake 583.75 ml  Output 300 ml  Net 283.75 ml   Filed Weights   04/15/17 0126 04/16/17 0407 04/17/17 0550  Weight: 95.3 kg (210 lb) 95.1 kg (209 lb 11.2 oz) 96.7 kg (213 lb 1.6 oz)    Examination: General: No acute respiratory distress - mildly tachypneic  Neck:  No palpable thyroid to my exam  Lungs: Clear to auscultation B - no wheezing or focal crackles  Cardiovascular: Irreg irreg - rate 100-120 - no M or rub  Abdomen: NT/ND, soft, bs+, no mass  Extremities: No signif edema bilateral lower extremities   CBC: Recent Labs  Lab 04/15/17 0014 04/15/17 0452 04/16/17 0427 04/17/17 0756  WBC 9.5 9.1 12.6* 12.2*  NEUTROABS 3.8  --   --   --   HGB 8.7* 8.1* 8.0* 8.4*  HCT 27.6* 25.9* 26.3* 28.9*  MCV 75.4* 76.4* 76.9* 77.5*  PLT 240 224 233 207   Basic Metabolic Panel: Recent Labs  Lab 04/15/17 0014 04/15/17 0452 04/16/17 0427 04/16/17 1250 04/17/17 0756  NA 139 140 141  --  138  K 4.2 4.0 3.9   --  4.4  CL 109 109 110  --  109  CO2 20* 20* 20*  --  18*  GLUCOSE 168* 185* 129*  --  146*  BUN 22* 21* 27*  --  37*  CREATININE 0.82 0.84 1.40*  --  1.53*  CALCIUM 9.6 9.3 9.5  --  9.5  MG 1.5*  --   --  2.2 2.2  PHOS 4.9*  --   --   --  5.7*   GFR: Estimated Creatinine Clearance: 39.2 mL/min (A) (by C-G formula based on SCr of 1.53 mg/dL (H)).  Liver Function Tests: Recent Labs  Lab 04/15/17 0014 04/16/17 0427 04/17/17 0756  AST 34 27 27  ALT 26 25 26   ALKPHOS 81 76 75  BILITOT 0.9 0.6 0.8  PROT 6.2* 6.4* 6.5  ALBUMIN 2.8* 2.9* 3.0*    Cardiac Enzymes: Recent Labs  Lab 04/15/17 0452  TROPONINI <0.03    HbA1C: Hemoglobin A1C  Date/Time Value Ref Range Status  02/01/2016 04:40 PM 8.0  Final  07/29/2015 04:47 PM 8.4  Final   Hgb A1c MFr Bld  Date/Time Value Ref Range Status  10/20/2015 08:48 AM 8.3 (H) 4.6 - 6.5 % Final    Comment:    Glycemic Control Guidelines for People with Diabetes:Non Diabetic:  <6%Goal of Therapy: <7%Additional Action Suggested:  >8%   12/29/2014 04:49 PM 9.5 (H) 4.6 - 6.5 % Final    Comment:    Glycemic Control Guidelines for People with Diabetes:Non Diabetic:  <6%Goal of Therapy: <7%Additional Action Suggested:  >8%     CBG: Recent Labs  Lab 04/16/17 1126 04/16/17 1610 04/16/17 2029 04/17/17 0740 04/17/17 1118  GLUCAP 153* 153* 136* 148* 164*     Scheduled Meds: . atenolol  50 mg Oral BID  . enoxaparin (LOVENOX) injection  1 mg/kg Subcutaneous BID  . gabapentin  300 mg Oral TID  . hydrocortisone sod succinate (SOLU-CORTEF) inj  100 mg Intravenous Q8H  . insulin aspart  0-20 Units Subcutaneous TID WC  . mouth rinse  15 mL Mouth Rinse BID  . methimazole  20 mg Oral Daily  . pravastatin  40 mg Oral Daily   Continuous Infusions: . sodium chloride 75 mL/hr at 04/17/17 0049     LOS: 2 days   04/19/17, MD [facility] Office  [phone] Pager - Text Page per [phone] as per below:  On-Call/Text  Page:      [email or web address]      password [email or web address]  If 7PM-7AM, please contact night-coverage [email or web address] Password [facility] 04/17/2017, 4:42 PM

## 2017-04-17 NOTE — Progress Notes (Signed)
When pt was up to bathroom to urinate and brush teeth and returned to bed, became diaphoretic, nauseated and vomitted small amount of yellow bile.  Rested in bed and then got up to [facility], had diarrhea.  Returned to bed wheezing and SHOB, called RT for treatment and they said they will come up to assess.  Spo2 99 on 3L [place], pt c/o difficulty breathing, coughing up thick white phlegm.  Throat irritated and oral care provided   MD - can pt have med to loosen cough?

## 2017-04-18 ENCOUNTER — Encounter (HOSPITAL_COMMUNITY): Payer: Self-pay | Admitting: *Deleted

## 2017-04-18 ENCOUNTER — Inpatient Hospital Stay (HOSPITAL_COMMUNITY): Payer: BLUE CROSS/BLUE SHIELD

## 2017-04-18 DIAGNOSIS — E0590 Thyrotoxicosis, unspecified without thyrotoxic crisis or storm: Secondary | ICD-10-CM

## 2017-04-18 DIAGNOSIS — Z794 Long term (current) use of insulin: Secondary | ICD-10-CM

## 2017-04-18 DIAGNOSIS — I10 Essential (primary) hypertension: Secondary | ICD-10-CM

## 2017-04-18 DIAGNOSIS — E1165 Type 2 diabetes mellitus with hyperglycemia: Secondary | ICD-10-CM

## 2017-04-18 DIAGNOSIS — R05 Cough: Secondary | ICD-10-CM

## 2017-04-18 DIAGNOSIS — R062 Wheezing: Secondary | ICD-10-CM

## 2017-04-18 DIAGNOSIS — E1142 Type 2 diabetes mellitus with diabetic polyneuropathy: Secondary | ICD-10-CM

## 2017-04-18 LAB — COMPREHENSIVE METABOLIC PANEL
ALBUMIN: 2.8 g/dL — AB (ref 3.5–5.0)
ALT: 30 U/L (ref 14–54)
ANION GAP: 8 (ref 5–15)
AST: 33 U/L (ref 15–41)
Alkaline Phosphatase: 84 U/L (ref 38–126)
BILIRUBIN TOTAL: 0.7 mg/dL (ref 0.3–1.2)
BUN: 42 mg/dL — ABNORMAL HIGH (ref 6–20)
CHLORIDE: 110 mmol/L (ref 101–111)
CO2: 18 mmol/L — ABNORMAL LOW (ref 22–32)
Calcium: 9.3 mg/dL (ref 8.9–10.3)
Creatinine, Ser: 1.17 mg/dL — ABNORMAL HIGH (ref 0.44–1.00)
GFR, EST AFRICAN AMERICAN: 54 mL/min — AB (ref >60)
GFR, EST NON AFRICAN AMERICAN: 46 mL/min — AB (ref >60)
Glucose, Bld: 284 mg/dL — ABNORMAL HIGH (ref 65–99)
POTASSIUM: 4.9 mmol/L (ref 3.5–5.1)
SODIUM: 136 mmol/L (ref 135–145)
TOTAL PROTEIN: 6.3 g/dL — AB (ref 6.5–8.1)

## 2017-04-18 LAB — CBC
HEMATOCRIT: 26.6 % — AB (ref 36.0–46.0)
HEMOGLOBIN: 8 g/dL — AB (ref 12.0–15.0)
MCH: 23.3 pg — ABNORMAL LOW (ref 26.0–34.0)
MCHC: 30.1 g/dL (ref 30.0–36.0)
MCV: 77.6 fL — AB (ref 78.0–100.0)
Platelets: 220 10*3/uL (ref 150–400)
RBC: 3.43 MIL/uL — AB (ref 3.87–5.11)
RDW: 14.9 % (ref 11.5–15.5)
WBC: 8.4 10*3/uL (ref 4.0–10.5)

## 2017-04-18 LAB — MAGNESIUM: Magnesium: 2.2 mg/dL (ref 1.7–2.4)

## 2017-04-18 LAB — GLUCOSE, CAPILLARY
GLUCOSE-CAPILLARY: 259 mg/dL — AB (ref 65–99)
Glucose-Capillary: 265 mg/dL — ABNORMAL HIGH (ref 65–99)
Glucose-Capillary: 222 mg/dL — ABNORMAL HIGH (ref 65–99)
Glucose-Capillary: 246 mg/dL — ABNORMAL HIGH (ref 65–99)

## 2017-04-18 MED ORDER — DILTIAZEM HCL 60 MG PO TABS
60.0000 mg | ORAL_TABLET | Freq: Three times a day (TID) | ORAL | Status: DC
  Administered 2017-04-18 – 2017-04-19 (×5): 60 mg via ORAL
  Filled 2017-04-18 (×5): qty 1

## 2017-04-18 MED ORDER — FLUTICASONE FUROATE-VILANTEROL 200-25 MCG/INH IN AEPB
1.0000 | INHALATION_SPRAY | Freq: Every day | RESPIRATORY_TRACT | Status: DC
  Administered 2017-04-18 – 2017-04-20 (×3): 1 via RESPIRATORY_TRACT
  Filled 2017-04-18: qty 28

## 2017-04-18 MED ORDER — LORATADINE 10 MG PO TABS
10.0000 mg | ORAL_TABLET | Freq: Every day | ORAL | Status: DC
  Administered 2017-04-18 – 2017-04-20 (×3): 10 mg via ORAL
  Filled 2017-04-18 (×3): qty 1

## 2017-04-18 MED ORDER — DEXTROMETHORPHAN POLISTIREX ER 30 MG/5ML PO SUER
15.0000 mg | Freq: Two times a day (BID) | ORAL | Status: DC
Start: 2017-04-18 — End: 2017-04-20
  Administered 2017-04-18 – 2017-04-20 (×5): 15 mg via ORAL
  Filled 2017-04-18 (×5): qty 5

## 2017-04-18 NOTE — Progress Notes (Signed)
Inpatient Diabetes Program Recommendations  AACE/ADA: New Consensus Statement on Inpatient Glycemic Control (2015)  Target Ranges:  Prepandial:   less than 140 mg/dL      Peak postprandial:   less than 180 mg/dL (1-2 hours)      Critically ill patients:  140 - 180 mg/dL   Results for Donna Booker, Donna Booker (MRN [id number]) as of 04/18/2017 10:13  Ref. Range 04/17/2017 07:40 04/17/2017 11:18 04/17/2017 17:30 04/17/2017 22:02 04/18/2017 07:49  Glucose-Capillary Latest Ref Range: 65 - 99 mg/dL 401 (H) 027 (H) 253 (H) 291 (H) 259 (H)   Review of Glycemic Control  Diabetes history: DM2 Outpatient Diabetes medications: Levemir 35 units QHS, Glipizide 5 mg QAM, Metformin 1000 mg BID Current orders for Inpatient glycemic control: Novolog 0-20 units TID with meals; Solucortef 100 mg Q8H  Inpatient Diabetes Program Recommendations:  Insulin - Basal: If steroids are continued as ordered, please consider ordering Lantus 10 units Q24H.  Insulin-Correction: Please consider ordering Novolog 0-5 units QHS for bedtime correction scale. Insulin - Meal Coverage: If steroids are continued, please consider ordering Novolog 3 units TID with meals for meal coverage if patient eats at least 50% of meals. HgbA1C: Please consider ordering an A1C to evaluate glycemic control over the past 2-3 months.  Thanks, Orlando Penner, RN, MSN, CDE Diabetes Coordinator Inpatient Diabetes Program [phone] (Team Pager from 8am to 5pm)

## 2017-04-18 NOTE — Evaluation (Signed)
Physical Therapy Evaluation Patient Details Name: Donna Booker MRN: [id number] DOB: Oct 25, 1945 Today's Date: 04/18/2017   History of Present Illness  Donna Booker is a 72 y.o. female with past medical history significant for hypertension and diabetes presents emergency room with chief complaint of shortness of breath. Pt found to be in A-fib with RVR.  Clinical Impression  Pt admitted with above diagnosis. Pt currently with functional limitations due to the deficits listed below (see PT Problem List). Pt HR varied from 100s-140s t/o eval. Pt with SOB however SpO2 >90% on 3LO2 via [place]. MObility greatly limited by SOB, pt was indep and working PTA. Pt will benefit from skilled PT to increase their independence and safety with mobility to allow discharge to the venue listed below.       Follow Up Recommendations Home health PT;Supervision - Intermittent    Equipment Recommendations  Rolling walker with 5" wheels    Recommendations for Other Services       Precautions / Restrictions Precautions Precautions: Fall Precaution Comments: SOB Restrictions Weight Bearing Restrictions: No      Mobility  Bed Mobility Overal bed mobility: Modified Independent             General bed mobility comments: HOB elevated, sleeps on pillows, increased time  Transfers Overall transfer level: Needs assistance Equipment used: None Transfers: Sit to/from Stand Sit to Stand: Min assist         General transfer comment: pt with wide base of support, pt unsteady reaching for counter to hold onto, minA to steady pt  Ambulation/Gait Ambulation/Gait assistance: Min assist Ambulation Distance (Feet): 50 Feet Assistive device: Rolling walker (2 wheeled) Gait Pattern/deviations: Step-through pattern;Decreased stride length;Wide base of support Gait velocity: slow Gait velocity interpretation: <1.8 ft/sec, indicate of risk for recurrent falls General Gait Details: +SOB, SPO2 dec into low  90s on 3LO2 via [place]  Stairs            Wheelchair Mobility    Modified Rankin (Stroke Patients Only)       Balance Overall balance assessment: Needs assistance Sitting-balance support: Feet supported;No upper extremity supported Sitting balance-Leahy Scale: Good     Standing balance support: Single extremity supported Standing balance-Leahy Scale: Poor Standing balance comment: pt unsteady                             Pertinent Vitals/Pain Pain Assessment: No/denies pain    Home Living Family/patient expects to be discharged to:: Private residence Living Arrangements: Spouse/significant other Available Help at Discharge: Family;Available 24 hours/day Type of Home: House Home Access: Level entry     Home Layout: One level(3 steps in home to get to den, no HR) Home Equipment: Shower seat      Prior Function Level of Independence: Independent         Comments: drives, works as a Media planner        Extremity/Trunk Assessment   Upper Extremity Assessment Upper Extremity Assessment: Generalized weakness    Lower Extremity Assessment Lower Extremity Assessment: Generalized weakness    Cervical / Trunk Assessment Cervical / Trunk Assessment: Normal  Communication   Communication: No difficulties  Cognition Arousal/Alertness: Awake/alert Behavior During Therapy: WFL for tasks assessed/performed Overall Cognitive Status: Within Functional Limits for tasks assessed  General Comments General comments (skin integrity, edema, etc.): pt assisted to bathroom, uncontroled descent to commode, pt indep with hygiene, assist to get up off low surface height, +SOB    Exercises     Assessment/Plan    PT Assessment Patient needs continued PT services  PT Problem List Decreased strength;Decreased activity tolerance;Decreased balance;Decreased mobility;Cardiopulmonary  status limiting activity       PT Treatment Interventions DME instruction;Gait training;Stair training;Functional mobility training;Therapeutic activities;Therapeutic exercise;Balance training    PT Goals (Current goals can be found in the Care Plan section)  Acute Rehab PT Goals Patient Stated Goal: back to work PT Goal Formulation: With patient Time For Goal Achievement: 04/25/17 Potential to Achieve Goals: Good    Frequency Min 3X/week   Barriers to discharge        Co-evaluation               AM-PAC PT "6 Clicks" Daily Activity  Outcome Measure Difficulty turning over in bed (including adjusting bedclothes, sheets and blankets)?: A Little Difficulty moving from lying on back to sitting on the side of the bed? : A Little Difficulty sitting down on and standing up from a chair with arms (e.g., wheelchair, bedside commode, etc,.)?: Unable Help needed moving to and from a bed to chair (including a wheelchair)?: A Little Help needed walking in hospital room?: A Little Help needed climbing 3-5 steps with a railing? : A Lot 6 Click Score: 15    End of Session Equipment Utilized During Treatment: Gait belt;Oxygen(3LO2 via [place]) Activity Tolerance: Treatment limited secondary to medical complications (Comment)(limited by SOB) Patient left: in chair;with chair alarm set;with call bell/phone within reach Nurse Communication: Mobility status PT Visit Diagnosis: Unsteadiness on feet (R26.81);Difficulty in walking, not elsewhere classified (R26.2)    Time: 1779-3903 PT Time Calculation (min) (ACUTE ONLY): 28 min   Charges:   PT Evaluation $PT Eval Moderate Complexity: 1 Mod PT Treatments $Gait Training: 8-22 mins   PT G Codes:        Lewis Shock, PT, DPT Pager #: [phone] Office #: [phone]   Jameca Chumley M Kailah Pennel 04/18/2017, 11:37 AM

## 2017-04-18 NOTE — Progress Notes (Signed)
PROGRESS NOTE    Donna Booker   PNT:[id number]  DOB: 05-19-1945  DOA: 04/14/2017 PCP: Filomena Jungling, NP   Brief Narrative:  Donna Booker 72 y.o.femalewith a hx of HTN and DM who presented to the ED w/ shortness of breath worsening for a fewweeks. She gotto the point she couldntwalk 3 steps without becoming extremely winded and needing to stop.  In the ED she was found to be in atrial fibrillation with RVR, and was given a bolus of diltiazem and started on a diltiazem drip.   She also has a cough with chest congestion which she states started 2 months ago. She has had spring allergies in the past and has taken OTC medications.  Noted to have swelling of legs on admission 4/14 B LE venous duplex - no DVT    Subjective: She has a cough with mild amounts of clear sputum- eating better than when first admitted. Has never been told she has had thyroid abromalities    Assessment & Plan:   Principal Problem:   Hyperthyroidism  Ref. Range 04/16/2017 04:27 04/16/2017 12:50  TSH Latest Ref Range: 0.350 - 4.500 uIU/mL <0.010 (L)   Triiodothyronine,Free,Serum Latest Ref Range: 2.0 - 4.4 pg/mL  18.5 (H)  T4,Free(Direct) Latest Ref Range: 0.61 - 1.12 ng/dL 0.86 (H)      Ref. Range 04/16/2017 12:50  Thyroid Stimulating Immunoglob Latest Ref Range: 0.00 - 0.55 IU/L 8.02 (H)   - enlarged thyroid noted on CT chest on 03/04/17 - Tapazol 20 mg daily started on 4/15 and IV Hydrocortisone 100 mg every 8 hrs started on 4/16  Active Problems:   Atrial fibrillation with RVR  - EF 60--65% - Cardizem infusion started in ED and transitioned to Atenolol - Atenolol started at 25 mg daily and increased to 50 mg daily on 4/16     HTN (hypertension) - Norvasc held- on Atenolol  Cough/ congestion and wheezing x 2 months - on Xopenex routine and PRN currently- stop PRN Xopenex - CT chest in 03/04/17 noted normal lungs - smoked about 8-10 yrs and stopped in 2000 - ? Allergies > add Claritin,  Delsym and Advair for    Uncontrolled type 2 diabetes mellitus with diabetic polyneuropathy  - On Glipizide, Metformin and Levemir 35 U at home - change diet to diabetic and add back Levemir today - cont Neurontin 300 mg TID  CKD 3 - baseline Cr is about 1.1- 1.4 on labs as far back at 2016  Microcytic anemia  - Anemia panel suggest AOCD - Ferritin and Fe studies not c/w Fe deficiency - folate and B12 normal - guaiac stools   Hyperlipidemia Continue statin  B LE Leg swelling  - no DVT on venous duplex   Obesity - Body mass index is 35.99 kg/m   DVT prophylaxis: on Full dose Lovenox Code Status: Full code Family Communication:  Disposition Plan: PT pending Consultants:   none Procedures:   2 D ECHO Study Conclusions  - Left ventricle: The cavity size was normal. There was mild   concentric hypertrophy. Systolic function was normal. The   estimated ejection fraction was in the range of 60% to 65%. Wall   motion was normal; there were no regional wall motion   abnormalities. The study was not technically sufficient to allow   evaluation of LV diastolic dysfunction due to atrial   fibrillation. - Mitral valve: Calcified annulus. There was mild regurgitation. - Left atrium: The atrium was moderately dilated. - Right atrium:  The atrium was moderately dilated. - Tricuspid valve: There was moderate regurgitation. - Pulmonic valve: There was trivial regurgitation. - Pulmonary arteries: PA peak pressure: 48 mm Hg (S).  Impressions:  - The right ventricular systolic pressure was increased consistent   with moderate pulmonary hypertension.  Antimicrobials:  Anti-infectives (From admission, onward)   None       Objective: Vitals:   04/18/17 0448 04/18/17 0752 04/18/17 0752 04/18/17 0838  BP: (!) 158/79  140/74 (!) 160/107  Pulse: 100 (!) 108 99 (!) 119  Resp: 19 16    Temp: 97.9 F (36.6 C)     TempSrc: Oral     SpO2: 99% 100% 100%   Weight: 99 kg  (218 lb 3.2 oz)     Height:        Intake/Output Summary (Last 24 hours) at 04/18/2017 0859 Last data filed at 04/18/2017 0700 Gross per 24 hour  Intake 3646.71 ml  Output 600 ml  Net 3046.71 ml   Filed Weights   04/16/17 0407 04/17/17 0550 04/18/17 0448  Weight: 95.1 kg (209 lb 11.2 oz) 96.7 kg (213 lb 1.6 oz) 99 kg (218 lb 3.2 oz)    Examination: General exam: Appears comfortable  HEENT: PERRLA, oral mucosa moist, no sclera icterus or thrush Respiratory system: wheezing in right lung field. Respiratory effort normal. Cardiovascular system: S1 & S2 heard, IIRR, tachycardic.   Gastrointestinal system: Abdomen soft, non-tender, nondistended. Normal bowel sound. No organomegaly Central nervous system: Alert and oriented. No focal neurological deficits. Extremities: No cyanosis, clubbing or edema Skin: No rashes or ulcers Psychiatry:  Mood & affect appropriate.     Data Reviewed: I have personally reviewed following labs and imaging studies  CBC: Recent Labs  Lab 04/15/17 0014 04/15/17 0452 04/16/17 0427 04/17/17 0756 04/18/17 0347  WBC 9.5 9.1 12.6* 12.2* 8.4  NEUTROABS 3.8  --   --   --   --   HGB 8.7* 8.1* 8.0* 8.4* 8.0*  HCT 27.6* 25.9* 26.3* 28.9* 26.6*  MCV 75.4* 76.4* 76.9* 77.5* 77.6*  PLT 240 224 233 207 220   Basic Metabolic Panel: Recent Labs  Lab 04/15/17 0014 04/15/17 0452 04/16/17 0427 04/16/17 1250 04/17/17 0756 04/18/17 0347  NA 139 140 141  --  138 136  K 4.2 4.0 3.9  --  4.4 4.9  CL 109 109 110  --  109 110  CO2 20* 20* 20*  --  18* 18*  GLUCOSE 168* 185* 129*  --  146* 284*  BUN 22* 21* 27*  --  37* 42*  CREATININE 0.82 0.84 1.40*  --  1.53* 1.17*  CALCIUM 9.6 9.3 9.5  --  9.5 9.3  MG 1.5*  --   --  2.2 2.2 2.2  PHOS 4.9*  --   --   --  5.7*  --    GFR: Estimated Creatinine Clearance: 51.9 mL/min (A) (by C-G formula based on SCr of 1.17 mg/dL (H)). Liver Function Tests: Recent Labs  Lab 04/15/17 0014 04/16/17 0427 04/17/17 0756  04/18/17 0347  AST 34 27 27 33  ALT 26 25 26 30   ALKPHOS 81 76 75 84  BILITOT 0.9 0.6 0.8 0.7  PROT 6.2* 6.4* 6.5 6.3*  ALBUMIN 2.8* 2.9* 3.0* 2.8*   No results for input(s): LIPASE, AMYLASE in the last 168 hours. No results for input(s): AMMONIA in the last 168 hours. Coagulation Profile: No results for input(s): INR, PROTIME in the last 168 hours. Cardiac Enzymes: Recent Labs  Lab 04/15/17 0452  TROPONINI <0.03   BNP (last 3 results) No results for input(s): PROBNP in the last 8760 hours. HbA1C: No results for input(s): HGBA1C in the last 72 hours. CBG: Recent Labs  Lab 04/17/17 0740 04/17/17 1118 04/17/17 1730 04/17/17 2202 04/18/17 0749  GLUCAP 148* 164* 178* 291* 259*   Lipid Profile: No results for input(s): CHOL, HDL, LDLCALC, TRIG, CHOLHDL, LDLDIRECT in the last 72 hours. Thyroid Function Tests: Recent Labs    04/16/17 0427 04/16/17 1250  TSH <0.010*  --   FREET4 5.15*  --   T3FREE  --  18.5*   Anemia Panel: Recent Labs    04/16/17 0427  VITAMINB12 1,085*  FOLATE 28.0  FERRITIN 579*  TIBC 217*  IRON 29  RETICCTPCT 2.8   Urine analysis:    Component Value Date/Time   COLORURINE YELLOW 05/04/2012 0728   APPEARANCEUR CLEAR 05/04/2012 0728   LABSPEC 1.018 05/04/2012 0728   PHURINE 6.0 05/04/2012 0728   GLUCOSEU 100 (A) 05/04/2012 0728   HGBUR MODERATE (A) 05/04/2012 0728   BILIRUBINUR NEGATIVE 05/04/2012 0728   KETONESUR NEGATIVE 05/04/2012 0728   PROTEINUR 100 (A) 05/04/2012 0728   UROBILINOGEN 0.2 05/04/2012 0728   NITRITE NEGATIVE 05/04/2012 0728   LEUKOCYTESUR NEGATIVE 05/04/2012 0728   Sepsis Labs: @LABRCNTIP (procalcitonin:4,lacticidven:4) ) Recent Results (from the past 240 hour(s))  MRSA PCR Screening     Status: None   Collection Time: 04/15/17  6:37 PM  Result Value Ref Range Status   MRSA by PCR NEGATIVE NEGATIVE Final    Comment:        The GeneXpert MRSA Assay (FDA approved for NASAL specimens only), is one component of  a comprehensive MRSA colonization surveillance program. It is not intended to diagnose MRSA infection nor to guide or monitor treatment for MRSA infections. Performed at [facility], [address], [place], [place]          Radiology Studies: Dg Chest Port 1 View  Result Date: 04/18/2017 CLINICAL DATA:  Shortness of breath, pulmonary edema. EXAM: PORTABLE CHEST 1 VIEW COMPARISON:  Radiograph of April 15, 2017. FINDINGS: Stable cardiomediastinal silhouette. No pneumothorax is noted. Atherosclerosis of thoracic aorta is noted. Increased bibasilar opacities, right greater than left, are noted consistent with pulmonary edema or atelectasis. No significant pleural effusion is noted. Bony thorax is unremarkable. IMPRESSION: Increased bibasilar opacities, right greater than left, consistent with worsening pulmonary edema or atelectasis. Aortic Atherosclerosis (ICD10-I70.0). Electronically Signed   By: April 17, 2017, M.D.   On: 04/18/2017 08:37      Scheduled Meds: . atenolol  50 mg Oral BID  . dextromethorphan  15 mg Oral BID  . diltiazem  60 mg Oral Q8H  . enoxaparin (LOVENOX) injection  1 mg/kg Subcutaneous BID  . fluticasone furoate-vilanterol  1 puff Inhalation Daily  . gabapentin  300 mg Oral TID  . hydrocortisone sod succinate (SOLU-CORTEF) inj  100 mg Intravenous Q8H  . insulin aspart  0-20 Units Subcutaneous TID WC  . levalbuterol  0.63 mg Nebulization TID  . loratadine  10 mg Oral Daily  . mouth rinse  15 mL Mouth Rinse BID  . methimazole  20 mg Oral Daily  . pantoprazole  40 mg Oral Q1200  . pravastatin  40 mg Oral Daily   Continuous Infusions:    LOS: 3 days    Time spent in minutes: 40    04/20/2017, MD [facility] Pager: [email or web address] Password [email or web address] 04/18/2017, 8:59 AM

## 2017-04-19 LAB — GLUCOSE, CAPILLARY
GLUCOSE-CAPILLARY: 268 mg/dL — AB (ref 65–99)
GLUCOSE-CAPILLARY: 274 mg/dL — AB (ref 65–99)
GLUCOSE-CAPILLARY: 236 mg/dL — AB (ref 65–99)
Glucose-Capillary: 255 mg/dL — ABNORMAL HIGH (ref 65–99)
Glucose-Capillary: 287 mg/dL — ABNORMAL HIGH (ref 65–99)

## 2017-04-19 MED ORDER — DILTIAZEM HCL 60 MG PO TABS
60.0000 mg | ORAL_TABLET | Freq: Three times a day (TID) | ORAL | Status: AC
  Administered 2017-04-19: 60 mg via ORAL
  Filled 2017-04-19: qty 1

## 2017-04-19 MED ORDER — INSULIN DETEMIR 100 UNIT/ML ~~LOC~~ SOLN
20.0000 [IU] | Freq: Every day | SUBCUTANEOUS | Status: DC
  Administered 2017-04-19: 20 [IU] via SUBCUTANEOUS
  Filled 2017-04-19 (×2): qty 0.2

## 2017-04-19 MED ORDER — INSULIN GLARGINE 100 UNIT/ML ~~LOC~~ SOLN
10.0000 [IU] | Freq: Every day | SUBCUTANEOUS | Status: DC
  Administered 2017-04-19: 10 [IU] via SUBCUTANEOUS
  Filled 2017-04-19: qty 0.1

## 2017-04-19 MED ORDER — LEVALBUTEROL HCL 0.63 MG/3ML IN NEBU
0.6300 mg | INHALATION_SOLUTION | Freq: Two times a day (BID) | RESPIRATORY_TRACT | Status: DC
  Administered 2017-04-19 – 2017-04-20 (×2): 0.63 mg via RESPIRATORY_TRACT
  Filled 2017-04-19 (×2): qty 3

## 2017-04-19 MED ORDER — HYDROCORTISONE NA SUCCINATE PF 100 MG IJ SOLR
50.0000 mg | Freq: Three times a day (TID) | INTRAMUSCULAR | Status: DC
  Administered 2017-04-19: 50 mg via INTRAVENOUS
  Filled 2017-04-19: qty 2

## 2017-04-19 MED ORDER — DILTIAZEM HCL ER COATED BEADS 180 MG PO CP24
180.0000 mg | ORAL_CAPSULE | Freq: Every day | ORAL | Status: DC
  Administered 2017-04-20: 180 mg via ORAL
  Filled 2017-04-19: qty 1

## 2017-04-19 NOTE — Progress Notes (Addendum)
PROGRESS NOTE    [name]   LZJ:[id number]  DOB: [birth year]  DOA: 04/14/2017 PCP: Filomena Jungling, NP   Brief Narrative:  Donna Booker 72 y.o.femalewith a hx of HTN and DM who presented to the ED w/ shortness of breath worsening for a fewweeks. She gotto the point she couldntwalk 3 steps without becoming extremely winded and needing to stop.  In the ED she was found to be in atrial fibrillation with RVR, and was given a bolus of diltiazem and started on a diltiazem drip.   She also has a cough with chest congestion which she states started 2 months ago. She has had spring allergies in the past and has taken OTC medications.  Noted to have swelling of legs on admission 4/14 B LE venous duplex - no DVT    Subjective: No complaints today. Cough and wheezing better today.   Assessment & Plan:   Principal Problem:   Hyperthyroidism  Ref. Range 04/16/2017 04:27 04/16/2017 12:50  TSH Latest Ref Range: 0.350 - 4.500 uIU/mL <0.010 (L)   Triiodothyronine,Free,Serum Latest Ref Range: 2.0 - 4.4 pg/mL  18.5 (H)  T4,Free(Direct) Latest Ref Range: 0.61 - 1.12 ng/dL 0.24 (H)      Ref. Range 04/16/2017 12:50  Thyroid Stimulating Immunoglob Latest Ref Range: 0.00 - 0.55 IU/L 8.02 (H)   - enlarged thyroid noted on CT chest on 03/04/17 - Tapazol 20 mg daily started on 4/15 and IV Hydrocortisone 100 mg every 8 hrs started on 4/16-have spoken with Dr Lafe Garin who saw the patient last year for DM- she recommends to continue the Tapazol, stop steroids and f/u in about 4-6 wks.  - HR better on Atenolol 50 BID and Cardizem 60 TID- will change to daily Cardizem tomorrow AM    Active Problems:   Atrial fibrillation with RVR - new diagnosis  - EF 60--65% - Cardizem infusion started in ED and transitioned to Atenolol - Atenolol started at 25 mg daily and increased to 50 mg BID and Cardizem 60 TID- HR much better today- will change to daily Cardizem tomorrow AM     HTN (hypertension) -  Norvasc held- on Atenolol  Cough/ congestion and wheezing x 2 months - on Xopenex routine and PRN currently- stop PRN Xopenex - CT chest in 03/04/17 noted normal lungs - smoked about 8-10 yrs and stopped in 2000 - ? Allergies > add Claritin, Delsym and Advair - doing better today with this regimen    Uncontrolled type 2 diabetes mellitus with diabetic polyneuropathy  - On Glipizide, Metformin and Levemir 35 U at home -  resume levemir at 20 U tonight - cont Neurontin 300 mg TID  CKD 3 - baseline Cr is about 1.1- 1.4 on labs as far back at 2016  Microcytic anemia  - Anemia panel suggest AOCD - Ferritin and Fe studies not c/w Fe deficiency - folate and B12 normal - guaiac stools   Hyperlipidemia Continue statin  B LE Leg swelling  - no DVT on venous duplex   Obesity - Body mass index is 35.99 kg/m   DVT prophylaxis: on Full dose Lovenox Code Status: Full code Family Communication:  Disposition Plan: PT pending Consultants:   none Procedures:   2 D ECHO Study Conclusions  - Left ventricle: The cavity size was normal. There was mild   concentric hypertrophy. Systolic function was normal. The   estimated ejection fraction was in the range of 60% to 65%. Wall   motion was normal; there  were no regional wall motion   abnormalities. The study was not technically sufficient to allow   evaluation of LV diastolic dysfunction due to atrial   fibrillation. - Mitral valve: Calcified annulus. There was mild regurgitation. - Left atrium: The atrium was moderately dilated. - Right atrium: The atrium was moderately dilated. - Tricuspid valve: There was moderate regurgitation. - Pulmonic valve: There was trivial regurgitation. - Pulmonary arteries: PA peak pressure: 48 mm Hg (S).  Impressions:  - The right ventricular systolic pressure was increased consistent   with moderate pulmonary hypertension.  Antimicrobials:  Anti-infectives (From admission, onward)   None        Objective: Vitals:   04/19/17 0546 04/19/17 0548 04/19/17 0850 04/19/17 1210  BP:  (!) 171/73 (!) 156/79 (!) 146/74  Pulse:  98 99 (!) 101  Resp:  (!) 22  20  Temp:  98.1 F (36.7 C)  98.3 F (36.8 C)  TempSrc:  Oral  Oral  SpO2: 96% 100%  98%  Weight: 98.4 kg (217 lb)     Height:        Intake/Output Summary (Last 24 hours) at 04/19/2017 1535 Last data filed at 04/18/2017 1945 Gross per 24 hour  Intake 480 ml  Output 150 ml  Net 330 ml   Filed Weights   04/17/17 0550 04/18/17 0448 04/19/17 0546  Weight: 96.7 kg (213 lb 1.6 oz) 99 kg (218 lb 3.2 oz) 98.4 kg (217 lb)    Examination: General exam: Appears comfortable  HEENT: PERRLA, oral mucosa moist, no sclera icterus or thrush Respiratory system:  Respiratory effort normal. Coarse breath sounds, no wheeze Cardiovascular system: S1 & S2 heard, IIRR, tachycardic.   Gastrointestinal system: Abdomen soft, non-tender, nondistended. Normal bowel sound. No organomegaly Central nervous system: Alert and oriented. No focal neurological deficits. Extremities: No cyanosis, clubbing or edema Skin: No rashes or ulcers Psychiatry:  Mood & affect appropriate.     Data Reviewed: I have personally reviewed following labs and imaging studies  CBC: Recent Labs  Lab 04/15/17 0014 04/15/17 0452 04/16/17 0427 04/17/17 0756 04/18/17 0347  WBC 9.5 9.1 12.6* 12.2* 8.4  NEUTROABS 3.8  --   --   --   --   HGB 8.7* 8.1* 8.0* 8.4* 8.0*  HCT 27.6* 25.9* 26.3* 28.9* 26.6*  MCV 75.4* 76.4* 76.9* 77.5* 77.6*  PLT 240 224 233 207 220   Basic Metabolic Panel: Recent Labs  Lab 04/15/17 0014 04/15/17 0452 04/16/17 0427 04/16/17 1250 04/17/17 0756 04/18/17 0347  NA 139 140 141  --  138 136  K 4.2 4.0 3.9  --  4.4 4.9  CL 109 109 110  --  109 110  CO2 20* 20* 20*  --  18* 18*  GLUCOSE 168* 185* 129*  --  146* 284*  BUN 22* 21* 27*  --  37* 42*  CREATININE 0.82 0.84 1.40*  --  1.53* 1.17*  CALCIUM 9.6 9.3 9.5  --  9.5 9.3  MG  1.5*  --   --  2.2 2.2 2.2  PHOS 4.9*  --   --   --  5.7*  --    GFR: Estimated Creatinine Clearance: 51.7 mL/min (A) (by C-G formula based on SCr of 1.17 mg/dL (H)). Liver Function Tests: Recent Labs  Lab 04/15/17 0014 04/16/17 0427 04/17/17 0756 04/18/17 0347  AST 34 27 27 33  ALT 26 25 26 30   ALKPHOS 81 76 75 84  BILITOT 0.9 0.6 0.8 0.7  PROT 6.2*  6.4* 6.5 6.3*  ALBUMIN 2.8* 2.9* 3.0* 2.8*   No results for input(s): LIPASE, AMYLASE in the last 168 hours. No results for input(s): AMMONIA in the last 168 hours. Coagulation Profile: No results for input(s): INR, PROTIME in the last 168 hours. Cardiac Enzymes: Recent Labs  Lab 04/15/17 0452  TROPONINI <0.03   BNP (last 3 results) No results for input(s): PROBNP in the last 8760 hours. HbA1C: No results for input(s): HGBA1C in the last 72 hours. CBG: Recent Labs  Lab 04/18/17 1600 04/18/17 2049 04/19/17 0740 04/19/17 1133 04/19/17 1211  GLUCAP 222* 246* 255* 274* 268*   Lipid Profile: No results for input(s): CHOL, HDL, LDLCALC, TRIG, CHOLHDL, LDLDIRECT in the last 72 hours. Thyroid Function Tests: No results for input(s): TSH, T4TOTAL, FREET4, T3FREE, THYROIDAB in the last 72 hours. Anemia Panel: No results for input(s): VITAMINB12, FOLATE, FERRITIN, TIBC, IRON, RETICCTPCT in the last 72 hours. Urine analysis:    Component Value Date/Time   COLORURINE YELLOW 05/04/2012 0728   APPEARANCEUR CLEAR 05/04/2012 0728   LABSPEC 1.018 05/04/2012 0728   PHURINE 6.0 05/04/2012 0728   GLUCOSEU 100 (A) 05/04/2012 0728   HGBUR MODERATE (A) 05/04/2012 0728   BILIRUBINUR NEGATIVE 05/04/2012 0728   KETONESUR NEGATIVE 05/04/2012 0728   PROTEINUR 100 (A) 05/04/2012 0728   UROBILINOGEN 0.2 05/04/2012 0728   NITRITE NEGATIVE 05/04/2012 0728   LEUKOCYTESUR NEGATIVE 05/04/2012 0728   Sepsis Labs: @LABRCNTIP (procalcitonin:4,lacticidven:4) ) Recent Results (from the past 240 hour(s))  MRSA PCR Screening     Status: None    Collection Time: 04/15/17  6:37 PM  Result Value Ref Range Status   MRSA by PCR NEGATIVE NEGATIVE Final    Comment:        The GeneXpert MRSA Assay (FDA approved for NASAL specimens only), is one component of a comprehensive MRSA colonization surveillance program. It is not intended to diagnose MRSA infection nor to guide or monitor treatment for MRSA infections. Performed at [facility], [address] [place], [place] [address]          Radiology Studies: Dg Chest Port 1 View  Result Date: 04/18/2017 CLINICAL DATA:  Shortness of breath, pulmonary edema. EXAM: PORTABLE CHEST 1 VIEW COMPARISON:  Radiograph of April 15, 2017. FINDINGS: Stable cardiomediastinal silhouette. No pneumothorax is noted. Atherosclerosis of thoracic aorta is noted. Increased bibasilar opacities, right greater than left, are noted consistent with pulmonary edema or atelectasis. No significant pleural effusion is noted. Bony thorax is unremarkable. IMPRESSION: Increased bibasilar opacities, right greater than left, consistent with worsening pulmonary edema or atelectasis. Aortic Atherosclerosis (ICD10-I70.0). Electronically Signed   By: Lupita Raider, M.D.   On: 04/18/2017 08:37      Scheduled Meds: . atenolol  50 mg Oral BID  . dextromethorphan  15 mg Oral BID  . diltiazem  60 mg Oral Q8H  . enoxaparin (LOVENOX) injection  1 mg/kg Subcutaneous BID  . fluticasone furoate-vilanterol  1 puff Inhalation Daily  . gabapentin  300 mg Oral TID  . hydrocortisone sod succinate (SOLU-CORTEF) inj  50 mg Intravenous Q8H  . insulin aspart  0-20 Units Subcutaneous TID WC  . insulin glargine  10 Units Subcutaneous Daily  . levalbuterol  0.63 mg Nebulization BID  . loratadine  10 mg Oral Daily  . mouth rinse  15 mL Mouth Rinse BID  . methimazole  20 mg Oral Daily  . pantoprazole  40 mg Oral Q1200  . pravastatin  40 mg Oral Daily  Continuous Infusions:    LOS: 4 days    Time spent in minutes:  40    Calvert Cantor, MD [facility] Pager: [email or web address] Password [email or web address] 04/19/2017, 3:35 PM

## 2017-04-19 NOTE — Progress Notes (Signed)
Physical Therapy Treatment Patient Details Name: Donna Booker MRN: [id number] DOB: December 20, 1945 Today's Date: 04/19/2017    History of Present Illness Donna Booker is a 71 y.o. female with PMH of HTN and DM, who presented 04/14/17 with c/o SOB. Found to be in A-fib with RVR.    PT Comments    Pt progressing well with mobility. Able to ambulate 150' with RW and intermittent min guard for balance. DOE up to 3/4, requiring multiple standing rest breaks; educ on pursed lip breathing.  SpO2 97% on RA at rest, 92-98% on RA with ambulation HR 81-111   Follow Up Recommendations  Home health PT;Supervision - Intermittent     Equipment Recommendations  Rolling walker with 5" wheels    Recommendations for Other Services       Precautions / Restrictions Precautions Precautions: Fall Restrictions Weight Bearing Restrictions: No    Mobility  Bed Mobility Overal bed mobility: Modified Independent             General bed mobility comments: HOB elevated, sleeps on pillows  Transfers Overall transfer level: Needs assistance Equipment used: Rolling walker (2 wheeled) Transfers: Sit to/from Stand Sit to Stand: Supervision         General transfer comment: Cues for hand placement and technique for eccentric control when sitting  Ambulation/Gait Ambulation/Gait assistance: Min guard;Supervision Ambulation Distance (Feet): 150 Feet Assistive device: Rolling walker (2 wheeled) Gait Pattern/deviations: Step-through pattern;Decreased stride length Gait velocity: Decreased   General Gait Details: Slow, controlled amb with RW and intermittent min guard for balance. Pt with DOE up to 3/4, requiring multiple standing rest breaks (at times leaning forward over RW); educ on pursed lip breathing   Stairs             Wheelchair Mobility    Modified Rankin (Stroke Patients Only)       Balance Overall balance assessment: Needs assistance Sitting-balance support: Feet  supported;No upper extremity supported Sitting balance-Leahy Scale: Good     Standing balance support: Single extremity supported Standing balance-Leahy Scale: Fair Standing balance comment: Can static stand with no UE support; stability improved with BUE support                            Cognition Arousal/Alertness: Awake/alert Behavior During Therapy: WFL for tasks assessed/performed Overall Cognitive Status: Within Functional Limits for tasks assessed                                        Exercises      General Comments General comments (skin integrity, edema, etc.): SpO2 >90% on RA throughout treatment      Pertinent Vitals/Pain Pain Assessment: No/denies pain    Home Living                      Prior Function            PT Goals (current goals can now be found in the care plan section) Acute Rehab PT Goals Patient Stated Goal: back to work PT Goal Formulation: With patient Time For Goal Achievement: 04/25/17 Potential to Achieve Goals: Good Progress towards PT goals: Progressing toward goals    Frequency    Min 3X/week      PT Plan Current plan remains appropriate    Co-evaluation  AM-PAC PT "6 Clicks" Daily Activity  Outcome Measure  Difficulty turning over in bed (including adjusting bedclothes, sheets and blankets)?: None Difficulty moving from lying on back to sitting on the side of the bed? : A Little Difficulty sitting down on and standing up from a chair with arms (e.g., wheelchair, bedside commode, etc,.)?: A Little Help needed moving to and from a bed to chair (including a wheelchair)?: A Little Help needed walking in hospital room?: A Little Help needed climbing 3-5 steps with a railing? : A Little 6 Click Score: 19    End of Session Equipment Utilized During Treatment: Gait belt Activity Tolerance: Patient tolerated treatment well;Patient limited by fatigue Patient left: in  bed;with call bell/phone within reach Nurse Communication: Mobility status PT Visit Diagnosis: Unsteadiness on feet (R26.81);Difficulty in walking, not elsewhere classified (R26.2)     Time: 4782-9562 PT Time Calculation (min) (ACUTE ONLY): 18 min  Charges:  $Gait Training: 8-22 mins                    G Codes:      Ina Homes, PT, DPT Acute Rehab Services  Pager: [phone]  Malachy Chamber 04/19/2017, 8:51 AM

## 2017-04-19 NOTE — Progress Notes (Signed)
Inpatient Diabetes Program Recommendations  AACE/ADA: New Consensus Statement on Inpatient Glycemic Control (2015)  Target Ranges:  Prepandial:   less than 140 mg/dL      Peak postprandial:   less than 180 mg/dL (1-2 hours)      Critically ill patients:  140 - 180 mg/dL   Results for Donna Booker, Donna Booker (MRN [id number]) as of 04/19/2017 09:00  Ref. Range 04/18/2017 07:49 04/18/2017 11:07 04/18/2017 16:00 04/18/2017 20:49 04/19/2017 07:40  Glucose-Capillary Latest Ref Range: 65 - 99 mg/dL 638 (H) 453 (H) 646 (H) 246 (H) 255 (H)   Review of Glycemic Control  Diabetes history: DM2 Outpatient Diabetes medications: Levemir 35 units QHS, Glipizide 5 mg QAM, Metformin 1000 mg BID Current orders for Inpatient glycemic control:  Lantus 10 units daily, Novolog 0-20 units TID with meals; Solucortef 50 mg Q8H  Inpatient Diabetes Program Recommendations:  Insulin-Correction: Please consider ordering Novolog 0-5 units QHS for bedtime correction scale. Insulin - Meal Coverage: If steroids are continued, please consider ordering Novolog 3 units TID with meals for meal coverage if patient eats at least 50% of meals. HgbA1C: Please consider ordering an A1C to evaluate glycemic control over the past 2-3 months.  Thanks, Orlando Penner, RN, MSN, CDE Diabetes Coordinator Inpatient Diabetes Program [phone] (Team Pager from 8am to 5pm)

## 2017-04-20 ENCOUNTER — Encounter (HOSPITAL_COMMUNITY): Payer: Self-pay

## 2017-04-20 ENCOUNTER — Inpatient Hospital Stay (HOSPITAL_COMMUNITY)
Admission: EM | Admit: 2017-04-20 | Discharge: 2017-04-24 | Disposition: A | Discharge status: Home / Self Care | Payer: BLUE CROSS/BLUE SHIELD | Source: Home / Self Care | Attending: Internal Medicine | Admitting: Internal Medicine

## 2017-04-20 ENCOUNTER — Emergency Department (HOSPITAL_COMMUNITY): Payer: BLUE CROSS/BLUE SHIELD

## 2017-04-20 DIAGNOSIS — Z8679 Personal history of other diseases of the circulatory system: Secondary | ICD-10-CM

## 2017-04-20 DIAGNOSIS — I4891 Unspecified atrial fibrillation: Secondary | ICD-10-CM | POA: Diagnosis present

## 2017-04-20 DIAGNOSIS — Z794 Long term (current) use of insulin: Secondary | ICD-10-CM

## 2017-04-20 DIAGNOSIS — D72829 Elevated white blood cell count, unspecified: Secondary | ICD-10-CM | POA: Diagnosis present

## 2017-04-20 DIAGNOSIS — J9621 Acute and chronic respiratory failure with hypoxia: Secondary | ICD-10-CM | POA: Diagnosis present

## 2017-04-20 DIAGNOSIS — R7989 Other specified abnormal findings of blood chemistry: Secondary | ICD-10-CM

## 2017-04-20 DIAGNOSIS — J810 Acute pulmonary edema: Secondary | ICD-10-CM

## 2017-04-20 DIAGNOSIS — R05 Cough: Secondary | ICD-10-CM

## 2017-04-20 DIAGNOSIS — E1142 Type 2 diabetes mellitus with diabetic polyneuropathy: Secondary | ICD-10-CM

## 2017-04-20 DIAGNOSIS — E0590 Thyrotoxicosis, unspecified without thyrotoxic crisis or storm: Secondary | ICD-10-CM | POA: Diagnosis present

## 2017-04-20 DIAGNOSIS — R059 Cough, unspecified: Secondary | ICD-10-CM

## 2017-04-20 DIAGNOSIS — E1169 Type 2 diabetes mellitus with other specified complication: Secondary | ICD-10-CM | POA: Diagnosis present

## 2017-04-20 DIAGNOSIS — J9610 Chronic respiratory failure, unspecified whether with hypoxia or hypercapnia: Secondary | ICD-10-CM | POA: Diagnosis present

## 2017-04-20 DIAGNOSIS — E785 Hyperlipidemia, unspecified: Secondary | ICD-10-CM

## 2017-04-20 DIAGNOSIS — R0602 Shortness of breath: Secondary | ICD-10-CM

## 2017-04-20 DIAGNOSIS — E118 Type 2 diabetes mellitus with unspecified complications: Secondary | ICD-10-CM

## 2017-04-20 DIAGNOSIS — I509 Heart failure, unspecified: Secondary | ICD-10-CM

## 2017-04-20 DIAGNOSIS — E1165 Type 2 diabetes mellitus with hyperglycemia: Secondary | ICD-10-CM

## 2017-04-20 DIAGNOSIS — I10 Essential (primary) hypertension: Secondary | ICD-10-CM | POA: Diagnosis present

## 2017-04-20 LAB — I-STAT TROPONIN, ED: Troponin i, poc: 0 ng/mL (ref 0.00–0.08)

## 2017-04-20 LAB — GLUCOSE, CAPILLARY
Glucose-Capillary: 319 mg/dL — ABNORMAL HIGH (ref 65–99)
Glucose-Capillary: 159 mg/dL — ABNORMAL HIGH (ref 65–99)
Glucose-Capillary: 197 mg/dL — ABNORMAL HIGH (ref 65–99)

## 2017-04-20 MED ORDER — METHIMAZOLE 10 MG PO TABS: 20.0000 mg | ORAL_TABLET | Freq: Every day | ORAL | 0 refills | Status: DC

## 2017-04-20 MED ORDER — ATENOLOL 50 MG PO TABS: 50.0000 mg | ORAL_TABLET | Freq: Two times a day (BID) | ORAL | 0 refills | Status: DC

## 2017-04-20 MED ORDER — DILTIAZEM HCL ER COATED BEADS 180 MG PO CP24: 180.0000 mg | ORAL_CAPSULE | Freq: Every day | ORAL | 0 refills | Status: DC

## 2017-04-20 MED ORDER — FLUTICASONE FUROATE-VILANTEROL 200-25 MCG/INH IN AEPB: 1.0000 | INHALATION_SPRAY | Freq: Every day | RESPIRATORY_TRACT | 0 refills | Status: DC

## 2017-04-20 MED ORDER — DEXTROMETHORPHAN POLISTIREX ER 30 MG/5ML PO SUER: 15.0000 mg | Freq: Two times a day (BID) | ORAL | 0 refills | Status: DC

## 2017-04-20 MED ORDER — APIXABAN 5 MG PO TABS: 5.0000 mg | ORAL_TABLET | Freq: Two times a day (BID) | ORAL | 0 refills | Status: DC

## 2017-04-20 MED ORDER — LORATADINE 10 MG PO TABS: 10.0000 mg | ORAL_TABLET | Freq: Every day | ORAL | 0 refills | Status: DC

## 2017-04-20 NOTE — ED Triage Notes (Signed)
Pt arrived via [facility] from home c/o respiratory distress.  Pt discharged from [facility] this mornign after spending "a few days" on the floor.  Hx A-fib.  Pt doesn't wear O2 at home but arrives on 2L for comfort.  Room air O2 sat 98%.  No EMS meds given, no IV on arrival.

## 2017-04-20 NOTE — ED Provider Notes (Addendum)
[facility] EMERGENCY DEPARTMENT Provider Note   CSN: [id number] Arrival date & time: 04/20/17  2218     History   Chief Complaint Chief Complaint  Patient presents with  . Shortness of Breath    HPI Donna Booker is a 72 y.o. female with a history of hypertension, hyperlipidemia, insulin-dependent diabetes, hypothyroidism, newly diagnosed atrial fibrillation on Eliquis who presents emergency department today for shortness of breath.  Patient was recently discharged from the hospital earlier today after being found to have new onset atrial fibrillation on 04/15/2017.  Patient notes that over the last 3 weeks she has been having progressive shortness of breath that is present at rest, as well as worsened with exertion and when lying down.  She states minimal activities such as walking a few steps will make her short of breath.  She reports she has had bilateral lower extremity swelling for some time that is worse on the right.  She did receive bilateral lower extremity DVT studies that were negative during her recent admission.  Patient notes that she has a baseline cough that is productive in nature does not change.  She denies any associated fever, chills, congestion, sinus pressure, sore throat, rhinorrhea, chest pain, chest tightness, dizziness, vertigo, lightheadedness, syncope abdominal pain, nausea/vomiting/diarrhea.  Patient has been compliant with her medications since discharge home earlier today. Patient is not on home O2.   HPI  Past Medical History:  Diagnosis Date  . Diabetes mellitus   . Hypertension     Patient Active Problem List   Diagnosis Date Noted  . Cough 04/20/2017  . Hyperthyroidism 04/18/2017  . Atrial fibrillation with RVR (HCC) 04/15/2017  . Routine general medical examination at a health care facility 10/20/2015  . Uncontrolled type 2 diabetes mellitus with diabetic polyneuropathy, with long-term current use of insulin (HCC) 02/25/2015    . Right foot pain 01/28/2015  . Hyperlipidemia 09/02/2014  . Morbid obesity (HCC) 09/02/2014  . HTN (hypertension) 10/04/2012    Past Surgical History:  Procedure Laterality Date  . BREAST SURGERY       OB History   None      Home Medications    Prior to Admission medications   Medication Sig Start Date End Date Taking? Authorizing Provider  apixaban (ELIQUIS) 5 MG TABS tablet Take 1 tablet (5 mg total) by mouth 2 (two) times daily. 04/20/17  Yes Calvert Cantor, MD  aspirin EC 81 MG tablet Take 81 mg by mouth daily.   Yes [provider]  atenolol (TENORMIN) 50 MG tablet Take 1 tablet (50 mg total) by mouth 2 (two) times daily. 04/20/17  Yes Calvert Cantor, MD  dextromethorphan (DELSYM) 30 MG/5ML liquid Take 2.5 mLs (15 mg total) by mouth 2 (two) times daily. 04/20/17  Yes Calvert Cantor, MD  diclofenac sodium (VOLTAREN) 1 % GEL Apply 2 g topically 4 (four) times daily. Patient taking differently: Apply 2 g topically 4 (four) times daily as needed (pain).  01/27/15  Yes Myrlene Broker, MD  diltiazem (CARDIZEM CD) 180 MG 24 hr capsule Take 1 capsule (180 mg total) by mouth daily. Patient taking differently: Take 180 mg by mouth at bedtime.  04/21/17  Yes Calvert Cantor, MD  esomeprazole (NEXIUM) 40 MG capsule Take 40 mg by mouth daily before breakfast.   Yes [provider]  fluticasone furoate-vilanterol (BREO ELLIPTA) 200-25 MCG/INH AEPB Inhale 1 puff into the lungs daily. 04/21/17  Yes Calvert Cantor, MD  gabapentin (NEURONTIN) 300 MG capsule  Take 300 mg by mouth 3 (three) times daily. 02/22/17  Yes [provider]  glipiZIDE (GLUCOTROL) 5 MG tablet Take 1 tablet (5 mg total) by mouth 2 (two) times daily before a meal. NEED ANNUAL VISIT FOR REFILLS Patient taking differently: Take 5 mg by mouth daily before breakfast. NEED ANNUAL VISIT FOR REFILLS 12/13/16  Yes Myrlene Broker, MD  Insulin Detemir (LEVEMIR) 100 UNIT/ML Pen Inject 35 Units into the  skin daily at 10 pm. 06/05/16  Yes Carlus Pavlov, MD  loratadine (CLARITIN) 10 MG tablet Take 1 tablet (10 mg total) by mouth daily. Patient taking differently: Take 10 mg by mouth at bedtime.  04/21/17  Yes Calvert Cantor, MD  metFORMIN (GLUCOPHAGE) 1000 MG tablet TAKE 1 TABLET DAILY WITH SUPPER Patient taking differently: Take 1,000 mg by mouth 2 (two) times daily with a meal.  01/07/16  Yes Carlus Pavlov, MD  methimazole (TAPAZOLE) 10 MG tablet Take 2 tablets (20 mg total) by mouth daily. 04/21/17  Yes Calvert Cantor, MD  ondansetron (ZOFRAN-ODT) 4 MG disintegrating tablet Take 1 tablet (4 mg total) by mouth every 8 (eight) hours as needed for nausea or vomiting. 01/10/17  Yes Benjiman Core, MD  pravastatin (PRAVACHOL) 40 MG tablet Take 1 tablet (40 mg total) by mouth daily. 10/28/15  Yes Myrlene Broker, MD    Family History History reviewed. No pertinent family history.  Social History Social History   Tobacco Use  . Smoking status: Never Smoker  . Smokeless tobacco: Never Used  Substance Use Topics  . Alcohol use: No  . Drug use: No     Allergies   Tramadol   Review of Systems Review of Systems  All other systems reviewed and are negative.    Physical Exam Updated Vital Signs BP (!) 150/131 (BP Location: Left Arm)   Pulse 94   Temp 98.7 F (37.1 C) (Oral)   Resp (!) 21   Ht 5\' 4"  (1.626 m)   Wt 97.5 kg (215 lb)   SpO2 100%   BMI 36.90 kg/m   Physical Exam  Constitutional: She appears well-developed and well-nourished.  HENT:  Head: Normocephalic and atraumatic.  Right Ear: External ear normal.  Left Ear: External ear normal.  Nose: Nose normal.  Mouth/Throat: Uvula is midline, oropharynx is clear and moist and mucous membranes are normal. No tonsillar exudate.  Eyes: Pupils are equal, round, and reactive to light. Right eye exhibits no discharge. Left eye exhibits no discharge. No scleral icterus.  Neck: Trachea normal. Neck supple. No  hepatojugular reflux and no JVD present. No spinous process tenderness present. No neck rigidity. No tracheal deviation and normal range of motion present.  Cardiovascular: Normal rate and intact distal pulses. An irregularly irregular rhythm present.  No murmur heard. Pulses:      Radial pulses are 2+ on the right side, and 2+ on the left side.       Femoral pulses are 2+ on the right side, and 2+ on the left side.      Dorsalis pedis pulses are 2+ on the right side, and 2+ on the left side.       Posterior tibial pulses are 2+ on the right side, and 2+ on the left side.  Patient with 1+ RL edema to level of mid-shin. Trace LLE.   Pulmonary/Chest: Accessory muscle usage present. Tachypnea noted. She has decreased breath sounds in the right lower field. She exhibits no tenderness.  Patient on 4 L of O2  Abdominal: Soft. Bowel sounds are normal. There is no tenderness. There is no rebound and no guarding.  Musculoskeletal: She exhibits no edema.  Lymphadenopathy:    She has no cervical adenopathy.  Neurological: She is alert.  Speech clear. Follows commands. No facial droop. PERRLA. EOM grossly intact. CN III-XII grossly intact. Grossly moves all extremities 4 without ataxia. Able and appropriate strength for age to upper and lower extremities bilaterally including grip strength.   Skin: Skin is warm and dry. No rash noted. She is not diaphoretic.  Psychiatric: She has a normal mood and affect.  Nursing note and vitals reviewed.    ED Treatments / Results  Labs (all labs ordered are listed, but only abnormal results are displayed) Labs Reviewed  BASIC METABOLIC PANEL - Abnormal; Notable for the following components:      Result Value   CO2 18 (*)    Glucose, Bld 293 (*)    BUN 40 (*)    GFR calc non Af Amer 60 (*)    All other components within normal limits  CBC WITH DIFFERENTIAL/PLATELET - Abnormal; Notable for the following components:   WBC 13.1 (*)    Hemoglobin 9.7 (*)     HCT 32.0 (*)    MCV 75.7 (*)    MCH 22.9 (*)    Neutro Abs 8.3 (*)    Monocytes Absolute 1.1 (*)    All other components within normal limits  BRAIN NATRIURETIC PEPTIDE - Abnormal; Notable for the following components:   B Natriuretic Peptide 1,050.9 (*)    All other components within normal limits  D-DIMER, QUANTITATIVE (NOT AT [facility]) - Abnormal; Notable for the following components:   D-Dimer, Quant 1.21 (*)    All other components within normal limits  I-STAT TROPONIN, ED    EKG EKG Interpretation  Date/Time:  Friday April 20 2017 22:33:32 EDT Ventricular Rate:  121 PR Interval:    QRS Duration: 83 QT Interval:  330 QTC Calculation: 469 R Axis:   12 Text Interpretation:  Atrial fibrillation Nonspecific T abnormalities, lateral leads Since last tracing rate faster 15 Apr 2017 Confirmed by Devoria Albe ([id number]) on 04/20/2017 11:26:56 PM   Radiology Dg Chest 2 View  Result Date: 04/21/2017 CLINICAL DATA:  Acute onset of shortness of breath. EXAM: CHEST - 2 VIEW COMPARISON:  Chest radiograph performed 04/18/2017 FINDINGS: The lungs are well-aerated. There is mild elevation of the right hemidiaphragm. Increased interstitial markings may reflect mild interstitial edema. A small right pleural effusion is noted. There is no evidence of pneumothorax. The heart is normal in size; the mediastinal contour is within normal limits. No acute osseous abnormalities are seen. Cervical spinal fusion hardware is noted. IMPRESSION: Mild elevation of the right hemidiaphragm. Increased interstitial markings may reflect mild interstitial edema. Small right pleural effusion noted. Electronically Signed   By: Roanna Raider M.D.   On: 04/21/2017 00:03    Procedures Procedures (including critical care time) CRITICAL CARE Performed by: Jacinto Halim   Total critical care time: 35 minutes  Critical care time was exclusive of separately billable procedures and treating other patients.  Critical care  was necessary to treat or prevent imminent or life-threatening deterioration.  Critical care was time spent personally by me on the following activities: development of treatment plan with patient and/or surrogate as well as nursing, discussions with consultants, evaluation of patient's response to treatment, examination of patient, obtaining history from patient or surrogate, ordering and performing treatments and interventions, ordering and review  of laboratory studies, ordering and review of radiographic studies, pulse oximetry and re-evaluation of patient's condition.  Medications Ordered in ED Medications  furosemide (LASIX) injection 40 mg (has no administration in time range)  ipratropium-albuterol (DUONEB) 0.5-2.5 (3) MG/3ML nebulizer solution 3 mL (3 mLs Nebulization Given 04/21/17 0035)     Initial Impression / Assessment and Plan / ED Course  I have reviewed the triage vital signs and the nursing notes.  Pertinent labs & imaging results that were available during my care of the patient were reviewed by me and considered in my medical decision making (see chart for details).     72 year old female with newly diagnosed atrial fibrillation during recent admission on 4/14 and started on Eliquis presenting to the emergency department today for shortness of breath.  Patient notes that she has been short of breath over the last 3 weeks.  She notes the shortness of breath is constant at rest and worsened with exertion and also when lying down.  She notes that she has had bilateral lower extremity edema that has been worse on the right.  On arrival the patient is requiring 4 L of O2 and appears to have increased risk of breathing with mild accessory muscle use.  She is not usually on O2.  She is protecting her airway.  Patient has decreased breath sounds at the right lower lobe.  There is also noted to be bilateral lower extremity edema that is worse than the right.  Patient states she has been  compliant with her medications.  She states shortness of breath has not changed from when she was discharged her during her stay.  Given new onset atrial fibrillation is concern for heart failure versus PE. Will give trial of duoneb to see if this improves patients symptoms.   EKG with a-fib. Nonspecific T wave abnormalities in lateral leads. Chest xray increased interstitial markings that may reflect interstitial edema.  There is a small right pleural effusion noted.  Will give IV Lasix.  Awaiting blood work.  On chart review patient did have bilateral lower extremity venous duplexes are negative for DVT.  Her most recent chest x-ray on 4/17 shows worsening pulmonary edema.  I do not see that patient was discharged home on Lasix for treatment of this.  She did have an echocardiogram on 4/15 that showed EF of 60-65%.  There were no regional wall motion abnormalities.   Troponin 0.00.  No significant electrolyte derangements.  No acute kidney injury.  Patient does have a leukocytosis of 13.1. Patient D-Dimer elevated at 1.21. Will order CTA to rule out PE. BNP 1050.9. With elevated BNP and edema on xray, feel patient is clinically in heart failure and will require admission. Will call for admission.   Dr. Clyde Lundborg to admit the patient. CTA still pending. Patient continue to protect airway and appears safe for admission. I appreciate him bringing the patient into the hospital.   Final Clinical Impressions(s) / ED Diagnoses   Final diagnoses:  Shortness of breath  History of atrial fibrillation  Elevated d-dimer  Elevated brain natriuretic peptide (BNP) level  Acute pulmonary edema [facility])    ED Discharge Orders    None       Princella Pellegrini 04/21/17 0114    Shaune Pollack, MD 05/02/17 1651    Jacinto Halim, PA-C 05/07/17 2336    Shaune Pollack, MD 05/14/17 1119

## 2017-04-20 NOTE — ED Notes (Signed)
Patient transported to X-ray 

## 2017-04-20 NOTE — Progress Notes (Signed)
Physical Therapy Treatment Patient Details Name: Donna Booker MRN: [id number] DOB: [birth year] Today's Date: 04/20/2017    History of Present Illness Donna Booker is a 72 y.o. female with PMH of HTN and DM, who presented 04/14/17 with c/o SOB. Found to be in A-fib with RVR.     PT Comments    Patient is progressing very well towards their physical therapy goals. Session focused on therapeutic exercise for BLE strengthening and gait training using Rollator. Patient with slow, steady ambulation using Rollator; prefers this for use over RW. Patient able to ambulate a total of 250 feet with Rollator, however, required 4 prolonged seated rest breaks. Recommending Rollator for use at home and in the community for increased mobility and to allow patients to take seated rest breaks. Will continue to progress endurance.   SpO2 90-98% on RA HR 100-130s     Follow Up Recommendations  Home health PT;Supervision - Intermittent     Equipment Recommendations  Other (comment)(Rollator)    Recommendations for Other Services       Precautions / Restrictions Precautions Precautions: Fall Restrictions Weight Bearing Restrictions: No    Mobility  Bed Mobility Overal bed mobility: Modified Independent             General bed mobility comments: HOB elevated  Transfers Overall transfer level: Needs assistance Equipment used: 4-wheeled walker Transfers: Sit to/from Stand Sit to Stand: Supervision         General transfer comment: Cues for hand placement   Ambulation/Gait Ambulation/Gait assistance: Supervision Ambulation Distance (Feet): 250 Feet Assistive device: 4-wheeled walker Gait Pattern/deviations: Step-through pattern;Decreased stride length Gait velocity: Decreased   General Gait Details: Supervision for safety with Rollator; patient with controlled ambulation and no LOB. Required 4 seated rest breaks (1-2 minutes). Cues for activity pacing. Verbal cueing for locking  Rollator prior to transfer    Stairs             Wheelchair Mobility    Modified Rankin (Stroke Patients Only)       Balance Overall balance assessment: Needs assistance Sitting-balance support: Feet supported;No upper extremity supported Sitting balance-Leahy Scale: Good     Standing balance support: No upper extremity supported;During functional activity Standing balance-Leahy Scale: Fair Standing balance comment: Can static stand with no UE support; stability improved with BUE support                            Cognition Arousal/Alertness: Awake/alert Behavior During Therapy: WFL for tasks assessed/performed Overall Cognitive Status: Within Functional Limits for tasks assessed                                        Exercises General Exercises - Lower Extremity Long Arc Quad: 20 reps;Seated;Both(With manual resistance) Hip Flexion/Marching: Seated;20 reps;Both    General Comments General comments (skin integrity, edema, etc.): SpO2 > 90% on RA throughout treatment      Pertinent Vitals/Pain Pain Assessment: No/denies pain    Home Living                      Prior Function            PT Goals (current goals can now be found in the care plan section) Acute Rehab PT Goals Patient Stated Goal: back to work PT Goal Formulation: With patient Time For  Goal Achievement: 04/25/17 Potential to Achieve Goals: Good Progress towards PT goals: Progressing toward goals    Frequency    Min 3X/week      PT Plan Current plan remains appropriate    Co-evaluation              AM-PAC PT "6 Clicks" Daily Activity  Outcome Measure  Difficulty turning over in bed (including adjusting bedclothes, sheets and blankets)?: None Difficulty moving from lying on back to sitting on the side of the bed? : A Little Difficulty sitting down on and standing up from a chair with arms (e.g., wheelchair, bedside commode, etc,.)?: A  Little Help needed moving to and from a bed to chair (including a wheelchair)?: A Little Help needed walking in hospital room?: A Little Help needed climbing 3-5 steps with a railing? : A Little 6 Click Score: 19    End of Session Equipment Utilized During Treatment: Gait belt Activity Tolerance: Patient tolerated treatment well;Patient limited by fatigue Patient left: with call bell/phone within reach;in chair Nurse Communication: Mobility status PT Visit Diagnosis: Unsteadiness on feet (R26.81);Difficulty in walking, not elsewhere classified (R26.2)     Time: 1100-1130 PT Time Calculation (min) (ACUTE ONLY): 30 min  Charges:  $Gait Training: 8-22 mins $Therapeutic Exercise: 8-22 mins                    G Codes:      Laurina Bustle, PT, DPT Acute Rehabilitation Services  Pager: [phone]    Vanetta Mulders 04/20/2017, 11:44 AM

## 2017-04-20 NOTE — ED Notes (Signed)
Assisted pt on the bedpan and pt is very short of breath but at 100% on 4L. Victorino Dike RN notified

## 2017-04-20 NOTE — Care Management Note (Addendum)
Case Management Note  Patient Details  Name: Donna Booker MRN: [id number] Date of Birth: [birth year]  Subjective/Objective: Pt presented for SOB and found to be in Afib RVR. PTA from home with husband. Per pt she still works but will be out for a while. PT/OT recommendations for [facility] PT.                    Action/Plan: Pt wanted to add RN Services for Disease/Medication Management. Agency list offered to patient and she chose [facility]. Referral made to [facility] to begin within 48 hours of transition home. DME rollator ordered via [facility]. DME to be delivered to room.   Expected Discharge Date:                  Expected Discharge Plan:  Home w Home Health Services  In-House Referral:  NA  Discharge planning Services  CM Consult  Post Acute Care Choice:  NA Choice offered to:  NA  DME Arranged:  Walker rolling with seat DME Agency:  [facility]  HH Arranged:  RN, Disease Management, PT HH Agency:  [facility]  Status of Service:  Completed, signed off  If discussed at Long Length of Stay Meetings, dates discussed:    Additional Comments: 1418 04-20-17 Tomi Bamberger, RN,BSN [phone] CM did provide pt with 30 day free Eliquis Card- Pharmacy to provide price. [facility] [address] has medication available. No further needs from CM at this time.    1312 04-20-17 Tomi Bamberger, RN,BSN [phone] [facility] will not be able to service- [facility] will service the patient. SOC to begin within 24-48 hours post d/c. No further needs from CM at this time.    1243 04-20-17 Tomi Bamberger, RN,BSN [phone] CM received call back and unfortunately [facility] will not be able to accept the patient 2/2 Insurance. CM did reach out to [facility] and they will see if can provide services. If so SOC to begin within 24-48 hours post d/c.   Elbert Ewings Lamar Laundry, RN 04/20/2017, 12:29 PM

## 2017-04-20 NOTE — Discharge Summary (Signed)
Physician Discharge Summary  Donna Booker ZOX:[id number] DOB: 1945/11/11 DOA: 04/14/2017  PCP: Filomena Jungling, NP  Admit date: 04/14/2017 Discharge date: 04/20/2017  Admitted From: home  Disposition:  home   Recommendations for Outpatient Follow-up:  1. F/u on cough  Home Health:  ordered  Equipment/Devices:  rollator    Discharge Condition:  stable   CODE STATUS:  Full code   Consultations:  Phone consult with Endocrine    Discharge Diagnoses:  Principal Problem:   Hyperthyroidism Active Problems:   Atrial fibrillation with RVR (HCC)   HTN (hypertension)   Morbid obesity (HCC)   Uncontrolled type 2 diabetes mellitus with diabetic polyneuropathy, with long-term current use of insulin (HCC)   Cough   Brief Summary: the ED w/ shortness of breath worsening for a fewweeks. She gotto the point she couldntwalk 3 steps without becoming extremely winded and needing to stop.  In the ED she was found to be in atrial fibrillation with RVR, and was given a bolus of diltiazem and started on a diltiazem drip.   She tells me she also has a cough with chest congestion which she states started 2 months ago. She has had spring allergies in the past and has taken OTC medications.  Noted to have swelling of legs on admission 4/14 B LE venous duplex - no DVT   I started seeing her on 4/17.   Hospital Course:  Hyperthyroidism  Ref. Range 04/16/2017 04:27 04/16/2017 12:50  TSH Latest Ref Range: 0.350 - 4.500 uIU/mL <0.010 (L)   Triiodothyronine,Free,Serum Latest Ref Range: 2.0 - 4.4 pg/mL  18.5 (H)  T4,Free(Direct) Latest Ref Range: 0.61 - 1.12 ng/dL 8.11 (H)      Ref. Range 04/16/2017 12:50  Thyroid Stimulating Immunoglob Latest Ref Range: 0.00 - 0.55 IU/L 8.02 (H)   - enlarged thyroid noted on CT chest on 03/04/17 - likely Grave's disease- Tapazol 20 mg daily started on 4/15 and IV Hydrocortisone 100 mg every 8 hrs started on 4/16 -have spoken with Dr Lafe Garin who saw the  patient last year for DM- she recommends to continue the Tapazol, stop steroids and f/u in about 4-6 wks.  - HR better on Atenolol 50 BID and Cardizem     Active Problems:   Atrial fibrillation with RVR - new diagnosis - likely related to above - EF 60--65% - Cardizem infusion started in ED and transitioned to Atenolol - Atenolol started at 25 mg daily and increased to 50 mg BID  - subsequently short acting Cardizem was added with resultant controlled the HR-  switched to daily Cardizem   - Full dose Lovenox >>> Eliquis- pharmacy to give Eliquis teaching     HTN (hypertension) - Norvasc held- on Atenolol  Cough/ congestion and wheezing x 2 months - CT chest in 03/04/17 noted normal lungs - smoked about 8-10 yrs and stopped in 2000 - NO fever or chills - ? Allergies > add Claritin, Delsym and Breo Ellipta - doing better today with this regimen    Uncontrolled type 2 diabetes mellitus with diabetic polyneuropathy  - On Glipizide, Metformin and Levemir 35 U at home - cont Neurontin 300 mg TID  CKD 3 - baseline Cr is about 1.1- 1.4 on labs as far back at 2016  Microcytic anemia - Anemia panel suggest AOCD - Ferritin and Fe studies not c/w Fe deficiency- folate and B12 normal -guaiac stools   Hyperlipidemia Continue statin  B LE Leg swelling  - no DVT on venous duplex  Obesity -Body mass index is 35.99 kg/m -   she has been losing weight- have encouraged her to keep working on this   Discharge Exam: Vitals:   04/20/17 1028 04/20/17 1058  BP:  (!) 155/95  Pulse:  85  Resp:    Temp:  98.2 F (36.8 C)  SpO2: 99% 99%   Vitals:   04/20/17 0742 04/20/17 1026 04/20/17 1028 04/20/17 1058  BP: (!) 160/107   (!) 155/95  Pulse: 72   85  Resp:      Temp: 98.3 F (36.8 C)   98.2 F (36.8 C)  TempSrc: Oral   Oral  SpO2: 99% 99% 99% 99%  Weight:      Height:        General: Pt is alert, awake, not in acute distress Cardiovascular: IIRR, S1/S2 +, no rubs, no  gallops Respiratory: CTA bilaterally, no wheezing, no rhonchi- has a congested cough Abdominal: Soft, NT, ND, bowel sounds + Extremities: no edema, no cyanosis   Discharge Instructions  Discharge Instructions    Diet - low sodium heart healthy   Complete by:  As directed    Increase activity slowly   Complete by:  As directed      Allergies as of 04/20/2017      Reactions   Tramadol Nausea And Vomiting      Medication List    STOP taking these medications   amLODipine 10 MG tablet Commonly known as:  NORVASC   ibuprofen 200 MG tablet Commonly known as:  ADVIL,MOTRIN     TAKE these medications   apixaban 5 MG Tabs tablet Commonly known as:  ELIQUIS Take 1 tablet (5 mg total) by mouth 2 (two) times daily.   aspirin EC 81 MG tablet Take 81 mg by mouth daily.   atenolol 50 MG tablet Commonly known as:  TENORMIN Take 1 tablet (50 mg total) by mouth 2 (two) times daily.   dextromethorphan 30 MG/5ML liquid Commonly known as:  DELSYM Take 2.5 mLs (15 mg total) by mouth 2 (two) times daily.   diclofenac sodium 1 % Gel Commonly known as:  VOLTAREN Apply 2 g topically 4 (four) times daily.   diltiazem 180 MG 24 hr capsule Commonly known as:  CARDIZEM CD Take 1 capsule (180 mg total) by mouth daily. Start taking on:  04/21/2017   esomeprazole 40 MG capsule Commonly known as:  NEXIUM Take 40 mg by mouth daily before breakfast.   fluticasone furoate-vilanterol 200-25 MCG/INH Aepb Commonly known as:  BREO ELLIPTA Inhale 1 puff into the lungs daily. Start taking on:  04/21/2017   gabapentin 300 MG capsule Commonly known as:  NEURONTIN Take 300 mg by mouth 3 (three) times daily.   glipiZIDE 5 MG tablet Commonly known as:  GLUCOTROL Take 1 tablet (5 mg total) by mouth 2 (two) times daily before a meal. NEED ANNUAL VISIT FOR REFILLS What changed:    when to take this  additional instructions   Insulin Detemir 100 UNIT/ML Pen Commonly known as:  LEVEMIR Inject  35 Units into the skin daily at 10 pm.   loratadine 10 MG tablet Commonly known as:  CLARITIN Take 1 tablet (10 mg total) by mouth daily. Start taking on:  04/21/2017   metFORMIN 1000 MG tablet Commonly known as:  GLUCOPHAGE TAKE 1 TABLET DAILY WITH SUPPER What changed:    how much to take  how to take this  when to take this  additional instructions   methimazole 10  MG tablet Commonly known as:  TAPAZOLE Take 2 tablets (20 mg total) by mouth daily. Start taking on:  04/21/2017   ondansetron 4 MG disintegrating tablet Commonly known as:  ZOFRAN-ODT Take 1 tablet (4 mg total) by mouth every 8 (eight) hours as needed for nausea or vomiting.   pravastatin 40 MG tablet Commonly known as:  PRAVACHOL Take 1 tablet (40 mg total) by mouth daily.            Durable Medical Equipment  (From admission, onward)        Start     Ordered   04/20/17 1218  For home use only DME 4 wheeled rolling walker with seat  Once    Question:  Patient needs a walker to treat with the following condition  Answer:  Weakness   04/20/17 1218     Follow-up Information    [facility] - Dme Follow up.   Why:  Rolling Walker with Seat.  Contact information: [address] [place] [address] [phone]        [facility], [facility]-Home Follow up.   Specialty:  Home Health Services Why:  Registered Nurse and Physical Therapy Contact information: [address] [place] [address] [phone]        Filomena Jungling, NP. Schedule an appointment as soon as possible for a visit in 1 week(s).   Specialty:  Nurse Practitioner Contact information: [address] [place] [address] [phone]        Carlus Pavlov, MD Follow up.   Specialty:  Internal Medicine Why:  she would like you to call for an appt in 4-6 wks.  Contact information: [address] [place] [address] [phone]          Allergies   Allergen Reactions  . Tramadol Nausea And Vomiting     Procedures/Studies:  2 D ECHO Study Conclusions  - Left ventricle: The cavity size was normal. There was mild concentric hypertrophy. Systolic function was normal. The estimated ejection fraction was in the range of 60% to 65%. Wall motion was normal; there were no regional wall motion abnormalities. The study was not technically sufficient to allow evaluation of LV diastolic dysfunction due to atrial fibrillation. - Mitral valve: Calcified annulus. There was mild regurgitation. - Left atrium: The atrium was moderately dilated. - Right atrium: The atrium was moderately dilated. - Tricuspid valve: There was moderate regurgitation. - Pulmonic valve: There was trivial regurgitation. - Pulmonary arteries: PA peak pressure: 48 mm Hg (S).  Impressions:  - The right ventricular systolic pressure was increased consistent with moderate pulmonary hypertension.    Dg Chest Port 1 View  Result Date: 04/18/2017 CLINICAL DATA:  Shortness of breath, pulmonary edema. EXAM: PORTABLE CHEST 1 VIEW COMPARISON:  Radiograph of April 15, 2017. FINDINGS: Stable cardiomediastinal silhouette. No pneumothorax is noted. Atherosclerosis of thoracic aorta is noted. Increased bibasilar opacities, right greater than left, are noted consistent with pulmonary edema or atelectasis. No significant pleural effusion is noted. Bony thorax is unremarkable. IMPRESSION: Increased bibasilar opacities, right greater than left, consistent with worsening pulmonary edema or atelectasis. Aortic Atherosclerosis (ICD10-I70.0). Electronically Signed   By: Lupita Raider, M.D.   On: 04/18/2017 08:37   Dg Chest Port 1 View  Result Date: 04/15/2017 CLINICAL DATA:  Shortness of breath.  Productive cough. EXAM: PORTABLE CHEST 1 VIEW COMPARISON:  Radiographs and CT 03/04/2017 FINDINGS: Lower lung volumes from prior exam with bronchovascular crowding. Upper  normal heart size with mild aortic atherosclerosis. Trace fluid in the right minor fissure. Minimal right infrahilar atelectasis. No confluent consolidation, pleural effusion or pneumothorax. No acute osseous abnormalities. IMPRESSION: Lower lung volumes from prior exam with bronchovascular crowding. Upper normal heart size with aortic atherosclerosis. Electronically Signed   By: Rubye Oaks M.D.   On: 04/15/2017 00:58     The results of significant diagnostics from this hospitalization (including imaging, microbiology, ancillary and laboratory) are listed below for reference.     Microbiology: Recent Results (from the past 240 hour(s))  MRSA PCR Screening     Status: None   Collection Time: 04/15/17  6:37 PM  Result Value Ref Range Status   MRSA by PCR NEGATIVE NEGATIVE Final    Comment:        The GeneXpert MRSA Assay (FDA approved for NASAL specimens only), is one component of a comprehensive MRSA colonization surveillance program. It is not intended to diagnose MRSA infection nor to guide or monitor treatment for MRSA infections. Performed at [facility], [address], [place], [place] [address]      Labs: BNP (last 3 results) Recent Labs    03/04/17 0934  BNP 216.3*   Basic Metabolic Panel: Recent Labs  Lab 04/15/17 0014 04/15/17 0452 04/16/17 0427 04/16/17 1250 04/17/17 0756 04/18/17 0347  NA 139 140 141  --  138 136  K 4.2 4.0 3.9  --  4.4 4.9  CL 109 109 110  --  109 110  CO2 20* 20* 20*  --  18* 18*  GLUCOSE 168* 185* 129*  --  146* 284*  BUN 22* 21* 27*  --  37* 42*  CREATININE 0.82 0.84 1.40*  --  1.53* 1.17*  CALCIUM 9.6 9.3 9.5  --  9.5 9.3  MG 1.5*  --   --  2.2 2.2 2.2  PHOS 4.9*  --   --   --  5.7*  --    Liver Function Tests: Recent Labs  Lab 04/15/17 0014 04/16/17 0427 04/17/17 0756 04/18/17 0347  AST 34 27 27 33  ALT 26 25 26 30   ALKPHOS 81 76 75 84  BILITOT 0.9 0.6 0.8 0.7  PROT 6.2* 6.4* 6.5 6.3*  ALBUMIN 2.8* 2.9*  3.0* 2.8*   No results for input(s): LIPASE, AMYLASE in the last 168 hours. No results for input(s): AMMONIA in the last 168 hours. CBC: Recent Labs  Lab 04/15/17 0014 04/15/17 0452 04/16/17 0427 04/17/17 0756 04/18/17 0347  WBC 9.5 9.1 12.6* 12.2* 8.4  NEUTROABS 3.8  --   --   --   --   HGB 8.7* 8.1* 8.0* 8.4* 8.0*  HCT 27.6* 25.9* 26.3* 28.9* 26.6*  MCV 75.4* 76.4* 76.9* 77.5* 77.6*  PLT 240 224 233 207 220   Cardiac Enzymes: Recent Labs  Lab 04/15/17 0452  TROPONINI <0.03   BNP: Invalid input(s): POCBNP CBG: Recent Labs  Lab 04/19/17 1557 04/19/17 1944 04/19/17 2046 04/20/17 0744 04/20/17 1102  GLUCAP 236* 319* 287* 159* 197*   D-Dimer No results for input(s): DDIMER in the last 72 hours. Hgb A1c No results for input(s): HGBA1C in the last 72 hours. Lipid Profile No results for input(s): CHOL, HDL, LDLCALC, TRIG, CHOLHDL, LDLDIRECT in the last 72 hours. Thyroid function studies No results for input(s): TSH, T4TOTAL, T3FREE, THYROIDAB in the last 72 hours.  Invalid input(s): FREET3 Anemia work up No results for input(s): VITAMINB12, FOLATE, FERRITIN, TIBC, IRON, RETICCTPCT in the last 72 hours.  Urinalysis    Component Value Date/Time   COLORURINE YELLOW 05/04/2012 0728   APPEARANCEUR CLEAR 05/04/2012 0728   LABSPEC 1.018 05/04/2012 0728   PHURINE 6.0 05/04/2012 0728   GLUCOSEU 100 (A) 05/04/2012 0728   HGBUR MODERATE (A) 05/04/2012 0728   BILIRUBINUR NEGATIVE 05/04/2012 0728   KETONESUR NEGATIVE 05/04/2012 0728   PROTEINUR 100 (A) 05/04/2012 0728   UROBILINOGEN 0.2 05/04/2012 0728   NITRITE NEGATIVE 05/04/2012 0728   LEUKOCYTESUR NEGATIVE 05/04/2012 0728   Sepsis Labs Invalid input(s): PROCALCITONIN,  WBC,  LACTICIDVEN Microbiology Recent Results (from the past 240 hour(s))  MRSA PCR Screening     Status: None   Collection Time: 04/15/17  6:37 PM  Result Value Ref Range Status   MRSA by PCR NEGATIVE NEGATIVE Final    Comment:        The  GeneXpert MRSA Assay (FDA approved for NASAL specimens only), is one component of a comprehensive MRSA colonization surveillance program. It is not intended to diagnose MRSA infection nor to guide or monitor treatment for MRSA infections. Performed at [facility], [address] [place], [place] [address]      Time coordinating discharge: 55 min-   SIGNED:   Calvert Cantor, MD  [facility] 04/20/2017, 2:13 PM Pager   If 7PM-7AM, please contact night-coverage [email or web address] Password [email or web address]

## 2017-04-21 ENCOUNTER — Other Ambulatory Visit: Payer: Self-pay

## 2017-04-21 ENCOUNTER — Encounter (HOSPITAL_COMMUNITY): Payer: Self-pay | Admitting: Internal Medicine

## 2017-04-21 ENCOUNTER — Inpatient Hospital Stay: Payer: Self-pay

## 2017-04-21 ENCOUNTER — Inpatient Hospital Stay (HOSPITAL_COMMUNITY): Payer: BLUE CROSS/BLUE SHIELD

## 2017-04-21 DIAGNOSIS — D72829 Elevated white blood cell count, unspecified: Secondary | ICD-10-CM | POA: Diagnosis present

## 2017-04-21 DIAGNOSIS — J9610 Chronic respiratory failure, unspecified whether with hypoxia or hypercapnia: Secondary | ICD-10-CM | POA: Diagnosis present

## 2017-04-21 DIAGNOSIS — I509 Heart failure, unspecified: Secondary | ICD-10-CM

## 2017-04-21 DIAGNOSIS — Z794 Long term (current) use of insulin: Secondary | ICD-10-CM

## 2017-04-21 DIAGNOSIS — J9621 Acute and chronic respiratory failure with hypoxia: Secondary | ICD-10-CM

## 2017-04-21 DIAGNOSIS — R7989 Other specified abnormal findings of blood chemistry: Secondary | ICD-10-CM | POA: Insufficient documentation

## 2017-04-21 DIAGNOSIS — E1165 Type 2 diabetes mellitus with hyperglycemia: Secondary | ICD-10-CM

## 2017-04-21 DIAGNOSIS — E785 Hyperlipidemia, unspecified: Secondary | ICD-10-CM

## 2017-04-21 DIAGNOSIS — I10 Essential (primary) hypertension: Secondary | ICD-10-CM

## 2017-04-21 DIAGNOSIS — E0590 Thyrotoxicosis, unspecified without thyrotoxic crisis or storm: Secondary | ICD-10-CM

## 2017-04-21 DIAGNOSIS — E1142 Type 2 diabetes mellitus with diabetic polyneuropathy: Secondary | ICD-10-CM

## 2017-04-21 DIAGNOSIS — Z8679 Personal history of other diseases of the circulatory system: Secondary | ICD-10-CM | POA: Insufficient documentation

## 2017-04-21 DIAGNOSIS — I4891 Unspecified atrial fibrillation: Principal | ICD-10-CM

## 2017-04-21 LAB — URINALYSIS, ROUTINE W REFLEX MICROSCOPIC
BILIRUBIN URINE: NEGATIVE
Glucose, UA: NEGATIVE mg/dL
KETONES UR: NEGATIVE mg/dL
LEUKOCYTES UA: NEGATIVE
NITRITE: NEGATIVE
PROTEIN: 30 mg/dL — AB
Specific Gravity, Urine: 1.008 (ref 1.005–1.030)
pH: 5 (ref 5.0–8.0)

## 2017-04-21 LAB — TROPONIN I
Troponin I: <0.03 ng/mL (ref <0.03)
Troponin I: <0.03 ng/mL (ref <0.03)
Troponin I: <0.03 ng/mL (ref <0.03)

## 2017-04-21 LAB — GLUCOSE, CAPILLARY
GLUCOSE-CAPILLARY: 180 mg/dL — AB (ref 65–99)
GLUCOSE-CAPILLARY: 157 mg/dL — AB (ref 65–99)
Glucose-Capillary: 208 mg/dL — ABNORMAL HIGH (ref 65–99)

## 2017-04-21 LAB — CBC WITH DIFFERENTIAL/PLATELET
BASOS PCT: 0 %
Basophils Absolute: 0 10*3/uL (ref 0.0–0.1)
EOS ABS: 0.1 10*3/uL (ref 0.0–0.7)
EOS PCT: 1 %
HCT: 32 % — ABNORMAL LOW (ref 36.0–46.0)
Hemoglobin: 9.7 g/dL — ABNORMAL LOW (ref 12.0–15.0)
Lymphocytes Relative: 28 %
Lymphs Abs: 3.7 10*3/uL (ref 0.7–4.0)
MCH: 22.9 pg — AB (ref 26.0–34.0)
MCHC: 30.3 g/dL (ref 30.0–36.0)
MCV: 75.7 fL — ABNORMAL LOW (ref 78.0–100.0)
Monocytes Absolute: 1.1 10*3/uL — ABNORMAL HIGH (ref 0.1–1.0)
Monocytes Relative: 8 %
NEUTROS PCT: 63 %
Neutro Abs: 8.3 10*3/uL — ABNORMAL HIGH (ref 1.7–7.7)
PLATELETS: 234 10*3/uL (ref 150–400)
RBC: 4.23 MIL/uL (ref 3.87–5.11)
RDW: 15.1 % (ref 11.5–15.5)
WBC: 13.1 10*3/uL — ABNORMAL HIGH (ref 4.0–10.5)

## 2017-04-21 LAB — BASIC METABOLIC PANEL
Anion gap: 11 (ref 5–15)
BUN: 40 mg/dL — AB (ref 6–20)
CALCIUM: 9.8 mg/dL (ref 8.9–10.3)
CO2: 18 mmol/L — ABNORMAL LOW (ref 22–32)
CREATININE: 0.95 mg/dL (ref 0.44–1.00)
Chloride: 111 mmol/L (ref 101–111)
GFR calc Af Amer: >60 mL/min (ref >60)
GFR, EST NON AFRICAN AMERICAN: 60 mL/min — AB (ref >60)
Glucose, Bld: 293 mg/dL — ABNORMAL HIGH (ref 65–99)
Potassium: 4 mmol/L (ref 3.5–5.1)
SODIUM: 140 mmol/L (ref 135–145)

## 2017-04-21 LAB — D-DIMER, QUANTITATIVE (NOT AT ARMC): D DIMER QUANT: 1.21 ug{FEU}/mL — AB (ref 0.00–0.50)

## 2017-04-21 LAB — CBG MONITORING, ED: Glucose-Capillary: 242 mg/dL — ABNORMAL HIGH (ref 65–99)

## 2017-04-21 LAB — BRAIN NATRIURETIC PEPTIDE: B NATRIURETIC PEPTIDE 5: 1050.9 pg/mL — AB (ref 0.0–100.0)

## 2017-04-21 MED ORDER — FLUTICASONE FUROATE-VILANTEROL 200-25 MCG/INH IN AEPB
1.0000 | INHALATION_SPRAY | Freq: Every day | RESPIRATORY_TRACT | Status: DC
  Administered 2017-04-22 – 2017-04-24 (×3): 1 via RESPIRATORY_TRACT
  Filled 2017-04-21: qty 28

## 2017-04-21 MED ORDER — ATENOLOL 25 MG PO TABS
50.0000 mg | ORAL_TABLET | Freq: Two times a day (BID) | ORAL | Status: DC
  Administered 2017-04-21 – 2017-04-22 (×3): 50 mg via ORAL
  Filled 2017-04-21 (×4): qty 2

## 2017-04-21 MED ORDER — HYDRALAZINE HCL 20 MG/ML IJ SOLN: 5.0000 mg | INTRAMUSCULAR | Status: DC | PRN

## 2017-04-21 MED ORDER — ONDANSETRON 4 MG PO TBDP
4.0000 mg | ORAL_TABLET | Freq: Three times a day (TID) | ORAL | Status: DC | PRN
  Filled 2017-04-21: qty 1

## 2017-04-21 MED ORDER — LORATADINE 10 MG PO TABS
10.0000 mg | ORAL_TABLET | Freq: Every day | ORAL | Status: DC
  Administered 2017-04-21 – 2017-04-23 (×3): 10 mg via ORAL
  Filled 2017-04-21 (×3): qty 1

## 2017-04-21 MED ORDER — ASPIRIN EC 81 MG PO TBEC
81.0000 mg | DELAYED_RELEASE_TABLET | Freq: Every day | ORAL | Status: DC
  Administered 2017-04-21 – 2017-04-24 (×4): 81 mg via ORAL
  Filled 2017-04-21 (×4): qty 1

## 2017-04-21 MED ORDER — ZOLPIDEM TARTRATE 5 MG PO TABS: 5.0000 mg | ORAL_TABLET | Freq: Every evening | ORAL | Status: DC | PRN

## 2017-04-21 MED ORDER — DICLOFENAC SODIUM 1 % TD GEL: 2.0000 g | Freq: Four times a day (QID) | TRANSDERMAL | Status: DC | PRN

## 2017-04-21 MED ORDER — FUROSEMIDE 10 MG/ML IJ SOLN
40.0000 mg | Freq: Two times a day (BID) | INTRAMUSCULAR | Status: DC
  Administered 2017-04-21 – 2017-04-22 (×3): 40 mg via INTRAVENOUS
  Filled 2017-04-21 (×4): qty 4

## 2017-04-21 MED ORDER — METHIMAZOLE 10 MG PO TABS
20.0000 mg | ORAL_TABLET | Freq: Every day | ORAL | Status: DC
  Administered 2017-04-21 – 2017-04-24 (×4): 20 mg via ORAL
  Filled 2017-04-21 (×5): qty 2

## 2017-04-21 MED ORDER — ONDANSETRON HCL 4 MG/2ML IJ SOLN: 4.0000 mg | Freq: Four times a day (QID) | INTRAMUSCULAR | Status: DC | PRN

## 2017-04-21 MED ORDER — DILTIAZEM HCL-DEXTROSE 100-5 MG/100ML-% IV SOLN (PREMIX)
5.0000 mg/h | INTRAVENOUS | Status: AC
  Administered 2017-04-21 – 2017-04-22 (×3): 5 mg/h via INTRAVENOUS
  Filled 2017-04-21 (×3): qty 100

## 2017-04-21 MED ORDER — POTASSIUM CHLORIDE CRYS ER 20 MEQ PO TBCR
20.0000 meq | EXTENDED_RELEASE_TABLET | Freq: Two times a day (BID) | ORAL | Status: DC
  Administered 2017-04-21 – 2017-04-24 (×7): 20 meq via ORAL
  Filled 2017-04-21 (×6): qty 1

## 2017-04-21 MED ORDER — FUROSEMIDE 10 MG/ML IJ SOLN
20.0000 mg | Freq: Once | INTRAMUSCULAR | Status: AC
  Administered 2017-04-21: 20 mg via INTRAVENOUS
  Filled 2017-04-21: qty 2

## 2017-04-21 MED ORDER — FUROSEMIDE 10 MG/ML IJ SOLN
40.0000 mg | Freq: Once | INTRAMUSCULAR | Status: AC
  Administered 2017-04-21: 40 mg via INTRAVENOUS
  Filled 2017-04-21: qty 4

## 2017-04-21 MED ORDER — SODIUM CHLORIDE 0.9% FLUSH: 3.0000 mL | INTRAVENOUS | Status: DC | PRN

## 2017-04-21 MED ORDER — ATENOLOL 50 MG PO TABS
50.0000 mg | ORAL_TABLET | Freq: Two times a day (BID) | ORAL | Status: DC
  Administered 2017-04-21: 50 mg via ORAL
  Filled 2017-04-21: qty 1

## 2017-04-21 MED ORDER — APIXABAN 5 MG PO TABS
5.0000 mg | ORAL_TABLET | Freq: Two times a day (BID) | ORAL | Status: DC
  Administered 2017-04-21 – 2017-04-24 (×7): 5 mg via ORAL
  Filled 2017-04-21 (×8): qty 1

## 2017-04-21 MED ORDER — LISINOPRIL 5 MG PO TABS
5.0000 mg | ORAL_TABLET | Freq: Every day | ORAL | Status: DC
  Administered 2017-04-21 – 2017-04-24 (×4): 5 mg via ORAL
  Filled 2017-04-21 (×4): qty 1

## 2017-04-21 MED ORDER — IPRATROPIUM-ALBUTEROL 0.5-2.5 (3) MG/3ML IN SOLN
3.0000 mL | Freq: Once | RESPIRATORY_TRACT | Status: AC
  Administered 2017-04-21: 3 mL via RESPIRATORY_TRACT
  Filled 2017-04-21: qty 3

## 2017-04-21 MED ORDER — GABAPENTIN 300 MG PO CAPS
300.0000 mg | ORAL_CAPSULE | Freq: Three times a day (TID) | ORAL | Status: DC
  Administered 2017-04-21 – 2017-04-24 (×9): 300 mg via ORAL
  Filled 2017-04-21 (×10): qty 1

## 2017-04-21 MED ORDER — SODIUM CHLORIDE 0.9% FLUSH: 10.0000 mL | INTRAVENOUS | Status: DC | PRN

## 2017-04-21 MED ORDER — FUROSEMIDE 10 MG/ML IJ SOLN
40.0000 mg | Freq: Every day | INTRAMUSCULAR | Status: DC
  Administered 2017-04-21: 40 mg via INTRAVENOUS
  Filled 2017-04-21: qty 4

## 2017-04-21 MED ORDER — ALBUTEROL SULFATE (2.5 MG/3ML) 0.083% IN NEBU: 2.5000 mg | INHALATION_SOLUTION | RESPIRATORY_TRACT | Status: DC | PRN

## 2017-04-21 MED ORDER — DILTIAZEM HCL ER COATED BEADS 120 MG PO CP24
120.0000 mg | ORAL_CAPSULE | Freq: Two times a day (BID) | ORAL | Status: DC
  Administered 2017-04-21: 120 mg via ORAL
  Filled 2017-04-21: qty 1

## 2017-04-21 MED ORDER — SODIUM CHLORIDE 0.9 % IV SOLN: 250.0000 mL | INTRAVENOUS | Status: DC | PRN

## 2017-04-21 MED ORDER — ATENOLOL 50 MG PO TABS
100.0000 mg | ORAL_TABLET | Freq: Two times a day (BID) | ORAL | Status: DC
  Filled 2017-04-21: qty 2
  Filled 2017-04-21: qty 4

## 2017-04-21 MED ORDER — IOPAMIDOL (ISOVUE-370) INJECTION 76%
INTRAVENOUS | Status: AC
  Administered 2017-04-21: 100 mL
  Filled 2017-04-21: qty 100

## 2017-04-21 MED ORDER — PANTOPRAZOLE SODIUM 40 MG PO TBEC
40.0000 mg | DELAYED_RELEASE_TABLET | Freq: Every day | ORAL | Status: DC
  Administered 2017-04-21 – 2017-04-24 (×4): 40 mg via ORAL
  Filled 2017-04-21 (×4): qty 1

## 2017-04-21 MED ORDER — IOPAMIDOL (ISOVUE-370) INJECTION 76%: 100.0000 mL | Freq: Once | INTRAVENOUS | Status: DC | PRN

## 2017-04-21 MED ORDER — INSULIN DETEMIR 100 UNIT/ML ~~LOC~~ SOLN
25.0000 [IU] | Freq: Every day | SUBCUTANEOUS | Status: DC
  Administered 2017-04-21 – 2017-04-23 (×3): 25 [IU] via SUBCUTANEOUS
  Filled 2017-04-21 (×4): qty 0.25

## 2017-04-21 MED ORDER — INSULIN ASPART 100 UNIT/ML ~~LOC~~ SOLN
0.0000 [IU] | Freq: Three times a day (TID) | SUBCUTANEOUS | Status: DC
  Administered 2017-04-21: 2 [IU] via SUBCUTANEOUS
  Administered 2017-04-21 (×2): 3 [IU] via SUBCUTANEOUS
  Administered 2017-04-22: 1 [IU] via SUBCUTANEOUS
  Administered 2017-04-22 – 2017-04-23 (×3): 2 [IU] via SUBCUTANEOUS
  Administered 2017-04-23 – 2017-04-24 (×2): 5 [IU] via SUBCUTANEOUS
  Administered 2017-04-24: 2 [IU] via SUBCUTANEOUS
  Filled 2017-04-21: qty 1

## 2017-04-21 MED ORDER — ACETAMINOPHEN 325 MG PO TABS
650.0000 mg | ORAL_TABLET | ORAL | Status: DC | PRN
  Administered 2017-04-23: 650 mg via ORAL
  Filled 2017-04-21: qty 2

## 2017-04-21 MED ORDER — ALPRAZOLAM 0.25 MG PO TABS: 0.2500 mg | ORAL_TABLET | Freq: Two times a day (BID) | ORAL | Status: DC | PRN

## 2017-04-21 MED ORDER — SODIUM CHLORIDE 0.9% FLUSH
10.0000 mL | Freq: Two times a day (BID) | INTRAVENOUS | Status: DC
Start: 2017-04-21 — End: 2017-04-24
  Administered 2017-04-21 – 2017-04-23 (×3): 10 mL

## 2017-04-21 MED ORDER — DILTIAZEM HCL ER COATED BEADS 180 MG PO CP24: 180.0000 mg | ORAL_CAPSULE | Freq: Every day | ORAL | Status: DC

## 2017-04-21 MED ORDER — SODIUM CHLORIDE 0.9% FLUSH
3.0000 mL | Freq: Two times a day (BID) | INTRAVENOUS | Status: DC
Start: 2017-04-21 — End: 2017-04-24
  Administered 2017-04-21 – 2017-04-23 (×3): 3 mL via INTRAVENOUS

## 2017-04-21 MED ORDER — DM-GUAIFENESIN ER 30-600 MG PO TB12: 1.0000 | ORAL_TABLET | Freq: Two times a day (BID) | ORAL | Status: DC | PRN

## 2017-04-21 MED ORDER — PRAVASTATIN SODIUM 40 MG PO TABS
40.0000 mg | ORAL_TABLET | Freq: Every day | ORAL | Status: DC
  Administered 2017-04-21 – 2017-04-23 (×3): 40 mg via ORAL
  Filled 2017-04-21 (×3): qty 1

## 2017-04-21 NOTE — ED Notes (Signed)
Patient transported to CT 

## 2017-04-21 NOTE — Plan of Care (Signed)
  Problem: Pain Managment: Goal: General experience of comfort will improve Outcome: Completed/Met

## 2017-04-21 NOTE — ED Notes (Signed)
2nd Report attempted 

## 2017-04-21 NOTE — ED Notes (Signed)
Renal diet w/ fluid restriction 1500 mL lunch tray ordered

## 2017-04-21 NOTE — ED Notes (Signed)
Pt getting PICC line placed will come to POD E after

## 2017-04-21 NOTE — Progress Notes (Signed)
Pt refused PICC line.  Attempted to call MD x2 and Magda Paganini RN attempted to call MD as well, all without answer.  Midline placed adequate for CT scan.

## 2017-04-21 NOTE — Progress Notes (Signed)
72 year old female admitted early this morning with complex medical problems discharged from the hospital 04/20/2017 after being admitted for A. fib RVR which was thought to be secondary to hyperthyroidism/Graves' disease.  Patient had very elevated free T4 and very suppressed TSH.  She was started on beta-blocker and Tapazole.  Now she is readmitted with increasing shortness of breath and was found to have new onset congestive heart failure and still in A. fib and RVR.  Chest x-ray showed pulmonary edema small right pleural effusion and positive d-dimer.  A CT angiogram of the chest has been ordered which is chest been done and the results are pending at this time.  Patient is on Eliquis at home for atrial fibrillation.  Cardiology consult was placed with Dr.Patwardhan recommended to increase her dose of diltiazem 120 mg twice a day and beta-blocker 100 twice daily and Lasix to 40 twice a day.  She is on 5 mg of lisinopril which will be continued.  Potassium is being repleted.  Follow-up labs tomorrow.  Cardiology will consider cardioversion after 4 weeks of being on Eliquis.

## 2017-04-21 NOTE — ED Notes (Signed)
Report attempted 

## 2017-04-21 NOTE — Consult Note (Addendum)
Reason for Consult: Shortness of breath, Afib w/RVR Referring Physician: Trial Hospitalist  JANYRA BARILLAS is an 72 y.o. female.  HPI:   72 year old female with new diagnosis of Graves' disease with hypothyroidism, A. fib with RVR, uncontrolled type 2 diabetes mellitus with diabetic polyneuropathy, CK stage III, anemia of chronic disease, hyperlipidemia, morbid obesity, discharge from the hospital on 05/01/2017 after hospital admission for cough and shortness of breath. She was diagnosed with Graves' disease (TSH <0.01, [place] T4 5)of the A. fib, and started on Eliquis 5 mg twice a day, as well as atenolol 50 mg twice a day. She was discharged home on 04/20/2017. Patient returned to ED the same day with complaints of shortness of breath.  EKG showed A. fib with RVR with rate up to 140 bpm. Troponins negative. Chest x-ray showed mild interstitial edema and right pleural effusion. BNP elevated at 1050. CT angiogram negative for PE or aortic dissection. It did show coronary atherosclerosis, severity not defined on non-gated study. It also showed right more than left pleural effusion. Echocardiogram showed LVEF 19-37%, diastolic function assessment limited due to atrial fibrillation. Moderate biatrial dilatation, moderate tricuspid regurgitation with pulmonary hypertension PA systolic pressure 48 mmHg. By mouth diltiazem was added to atenolol, later switched to IV diltiazem due to poor rate control.   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 She has a sedentary job and does not exercise regularly. She endorses worsening shortness of breath, orthopnea, PND, bilateral leg edema for last month or so. She's also noticed 10-15 pounds weight loss, along with loss of appetite. She denies any chest pain, stroke or TIA symptoms. She hasn't smoked in several years, denies history of asthma, sleep apnea. She has a family history of "irregular heartbeat" in her mother  and father. She denies any knowledge of melena, hematemesis.  Past Medical History:  Diagnosis Date  . Diabetes mellitus   . Hypertension     Past Surgical History:  Procedure Laterality Date  . BREAST SURGERY      Family History  Problem Relation Age of Onset  . Diabetes Mellitus II Mother   . Diabetes Mellitus II Father   . Hypertension Father   . Hyperlipidemia Father     Social History:  reports that she has never smoked. She has never used smokeless tobacco. She reports that she does not drink alcohol or use drugs.  Allergies:  Allergies  Allergen Reactions  . Tramadol Nausea And Vomiting    Medications: I have reviewed the patient's current medications.  Results for orders placed or performed during the hospital encounter of 04/20/17 (from the past 48 hour(s))  Basic metabolic panel     Status: Abnormal   Collection Time: 04/20/17 11:22 PM  Result Value Ref Range   Sodium 140 135 - 145 mmol/L   Potassium 4.0 3.5 - 5.1 mmol/L   Chloride 111 101 - 111 mmol/L   CO2 18 (L) 22 - 32 mmol/L   Glucose, Bld 293 (H) 65 - 99 mg/dL   BUN 40 (H) 6 - 20 mg/dL   Creatinine, Ser 0.95 0.44 - 1.00 mg/dL   Calcium 9.8 8.9 - 10.3 mg/dL   GFR calc non Af Amer 60 (L) >60 mL/min   GFR calc Af Amer >60 >60 mL/min    Comment: (NOTE) The eGFR has been calculated using the CKD EPI equation. This calculation has not been validated in all clinical situations. eGFR's persistently <60 mL/min signify possible Chronic  Kidney Disease.    Anion gap 11 5 - 15    Comment: Performed at [facility] [address], [place], [place] [address]  CBC with Differential     Status: Abnormal   Collection Time: 04/20/17 11:22 PM  Result Value Ref Range   WBC 13.1 (H) 4.0 - 10.5 K/uL   RBC 4.23 3.87 - 5.11 MIL/uL   Hemoglobin 9.7 (L) 12.0 - 15.0 g/dL   HCT 32.0 (L) 36.0 - 46.0 %   MCV 75.7 (L) 78.0 - 100.0 fL   MCH 22.9 (L) 26.0 - 34.0 pg   MCHC 30.3 30.0 - 36.0 g/dL   RDW 15.1 11.5 -  15.5 %   Platelets 234 150 - 400 K/uL   Neutrophils Relative % 63 %   Neutro Abs 8.3 (H) 1.7 - 7.7 K/uL   Lymphocytes Relative 28 %   Lymphs Abs 3.7 0.7 - 4.0 K/uL   Monocytes Relative 8 %   Monocytes Absolute 1.1 (H) 0.1 - 1.0 K/uL   Eosinophils Relative 1 %   Eosinophils Absolute 0.1 0.0 - 0.7 K/uL   Basophils Relative 0 %   Basophils Absolute 0.0 0.0 - 0.1 K/uL    Comment: Performed at [facility], [place] [address] [place], [place] [address]  Brain natriuretic peptide     Status: Abnormal   Collection Time: 04/20/17 11:22 PM  Result Value Ref Range   B Natriuretic Peptide 1,050.9 (H) 0.0 - 100.0 pg/mL    Comment: Performed at [facility] [address], [place], [place] [address]  D-dimer, quantitative (not at [facility])     Status: Abnormal   Collection Time: 04/20/17 11:22 PM  Result Value Ref Range   D-Dimer, Quant 1.21 (H) 0.00 - 0.50 ug/mL-FEU    Comment: (NOTE) At the manufacturer cut-off of 0.50 ug/mL FEU, this assay has been documented to exclude PE with a sensitivity and negative predictive value of 97 to 99%.  At this time, this assay has not been approved by the FDA to exclude DVT/VTE. Results should be correlated with clinical presentation. Performed at [facility], [place] [address] [place],  [address]   I-stat troponin, ED     Status: None   Collection Time: 04/20/17 11:35 PM  Result Value Ref Range   Troponin i, poc 0.00 0.00 - 0.08 ng/mL   Comment 3            Comment: Due to the release kinetics of cTnI, a negative result within the first hours of the onset of symptoms does not rule out myocardial infarction with certainty. If myocardial infarction is still suspected, repeat the test at appropriate intervals.   Urinalysis, Routine w reflex microscopic     Status: Abnormal   Collection Time: 04/21/17  1:24 AM  Result Value Ref Range   Color, Urine STRAW (A) YELLOW   APPearance CLEAR CLEAR   Specific Gravity, Urine 1.008 1.005 -  1.030   pH 5.0 5.0 - 8.0   Glucose, UA NEGATIVE NEGATIVE mg/dL   Hgb urine dipstick SMALL (A) NEGATIVE   Bilirubin Urine NEGATIVE NEGATIVE   Ketones, ur NEGATIVE NEGATIVE mg/dL   Protein, ur 30 (A) NEGATIVE mg/dL   Nitrite NEGATIVE NEGATIVE   Leukocytes, UA NEGATIVE NEGATIVE   RBC / HPF 0-5 0 - 5 RBC/hpf   WBC, UA 0-5 0 - 5 WBC/hpf   Bacteria, UA RARE (A) NONE SEEN   Squamous Epithelial / LPF 0-5 (A)  NONE SEEN   Hyaline Casts, UA PRESENT     Comment: Performed at [facility], [place] [address] [place], [place] [address]  Troponin I (q 6hr x 3)     Status: None   Collection Time: 04/21/17  4:13 AM  Result Value Ref Range   Troponin I <0.03 <0.03 ng/mL    Comment: Performed at [facility] [address] [place], [place] [address]  CBG monitoring, ED     Status: Abnormal   Collection Time: 04/21/17  7:22 AM  Result Value Ref Range   Glucose-Capillary 242 (H) 65 - 99 mg/dL  Troponin I (q 6hr x 3)     Status: None   Collection Time: 04/21/17 11:14 AM  Result Value Ref Range   Troponin I <0.03 <0.03 ng/mL    Comment: Performed at [facility], [place] [address] [place], [place] [address]  Troponin I (q 6hr x 3)     Status: None   Collection Time: 04/21/17 11:16 AM  Result Value Ref Range   Troponin I <0.03 <0.03 ng/mL    Comment: Performed at [facility] [address], [place], [place] [address]  Glucose, capillary     Status: Abnormal   Collection Time: 04/21/17  1:18 PM  Result Value Ref Range   Glucose-Capillary 180 (H) 65 - 99 mg/dL    Dg Chest 2 View  Result Date: 04/21/2017 CLINICAL DATA:  Acute onset of shortness of breath. EXAM: CHEST - 2 VIEW COMPARISON:  Chest radiograph performed 04/18/2017 FINDINGS: The lungs are well-aerated. There is mild elevation of the right hemidiaphragm. Increased interstitial markings may reflect mild interstitial edema. A small right pleural effusion is noted. There is no evidence of pneumothorax. The heart is normal  in size; the mediastinal contour is within normal limits. No acute osseous abnormalities are seen. Cervical spinal fusion hardware is noted. IMPRESSION: Mild elevation of the right hemidiaphragm. Increased interstitial markings may reflect mild interstitial edema. Small right pleural effusion noted. Electronically Signed   By: Garald Balding M.D.   On: 04/21/2017 00:03   Ct Angio Chest Pe W/cm &/or Wo Cm  Result Date: 04/21/2017 CLINICAL DATA:  SOB, c/o respiratory distress. Pt discharged from [facility] this mornign after spending "a few days" on the floor. Hx A-fib. EXAM: CT ANGIOGRAPHY CHEST WITH CONTRAST TECHNIQUE: Multidetector CT imaging of the chest was performed using the standard protocol during bolus administration of intravenous contrast. Multiplanar CT image reconstructions and MIPs were obtained to evaluate the vascular anatomy. CONTRAST:  146m ISOVUE-370 IOPAMIDOL (ISOVUE-370) INJECTION 76% COMPARISON:  03/04/2017 FINDINGS: Cardiovascular: Satisfactory opacification of pulmonary arteries noted, and there is no evidence of pulmonary emboli. Scattered mild coronary calcifications. Adequate contrast opacification of the thoracic aorta with no evidence of dissection, aneurysm, or stenosis. There is classic 3-vessel brachiocephalic arch anatomy without proximal stenosis. Minimal Aortic Atherosclerosis (ICD10-170.0) calcified plaque in the distal arch and descending segment. Mediastinum/Nodes: No pericardial effusion. No hilar or mediastinal adenopathy. Thyromegaly. Lungs/Pleura: Moderate right and trace left pleural effusions. No pneumothorax. Loculated fluid in the minor and right major fissures resulting in pseudotumor. No pulmonary nodule. Dependent atelectasis posteriorly in the right lower lobe. Upper Abdomen: No acute findings Musculoskeletal: Anterior vertebral endplate spurring at multiple levels in the mid and lower thoracic spine. Negative for fracture or worrisome bone lesion. Review of the  MIP images confirms the above findings. IMPRESSION: 1. Negative for acute PE or thoracic aortic dissection. 2. Aortic Atherosclerosis (  ICD10-170.0) and coronary artery disease. Please note that although the presence of coronary artery calcium documents the presence of coronary artery disease, the severity of this disease and any potential stenosis cannot be assessed on this non-gated CT examination. Assessment for potential risk factor modification, dietary therapy or pharmacologic therapy may be warranted, if clinically indicated. 3. Pleural effusions right greater than left, new since previous. Electronically Signed   By: Lucrezia Europe M.D.   On: 04/21/2017 13:08   [place] Ekg Site Rite  Result Date: 04/21/2017 If Site Rite image not attached, placement could not be confirmed due to current cardiac rhythm.   EKG 04/20/2017: Atrial fibrillation with RVR. Normal axis. Normal conduction. Nonspecific ST-T changes lateral leads. No ischemia.  Echocardiogram 04/16/2017: Study Conclusions  - Left ventricle: The cavity size was normal. There was mild   concentric hypertrophy. Systolic function was normal. The   estimated ejection fraction was in the range of 60% to 65%. Wall   motion was normal; there were no regional wall motion   abnormalities. The study was not technically sufficient to allow   evaluation of LV diastolic dysfunction due to atrial   fibrillation. - Mitral valve: Calcified annulus. There was mild regurgitation. - Left atrium: The atrium was moderately dilated. - Right atrium: The atrium was moderately dilated. - Tricuspid valve: There was moderate regurgitation. - Pulmonic valve: There was trivial regurgitation. - Pulmonary arteries: PA peak pressure: 48 mm Hg (S).  Impressions:  - The right ventricular systolic pressure was increased consistent   with moderate pulmonary hypertension.  Review of Systems  Constitutional: Positive for malaise/fatigue and weight loss.  HENT:  Negative.   Eyes: Negative.   Respiratory: Positive for cough and shortness of breath. Negative for sputum production.   Cardiovascular: Positive for orthopnea, leg swelling and PND. Negative for chest pain, palpitations and claudication.  Gastrointestinal: Negative for abdominal pain, blood in stool and melena.  Genitourinary: Negative.   Musculoskeletal: Negative.   Skin: Negative.   Neurological: Negative for dizziness and loss of consciousness.  Endo/Heme/Allergies: Does not bruise/bleed easily.  Psychiatric/Behavioral: Negative.   All other systems reviewed and are negative.  Blood pressure (!) 109/97, pulse (!) 125, temperature 98.5 F (36.9 C), temperature source Tympanic, resp. rate (!) 22, height _0  (1.626 m), weight 93.1 kg (205 lb 3.2 oz), SpO2 100 %. Physical Exam  Constitutional: She is oriented to person, place, and time. She appears well-developed and well-nourished.  Sick appearing  HENT:  Head: Normocephalic and atraumatic.  Eyes: Pupils are equal, round, and reactive to light. Conjunctivae are normal.  Neck: Normal range of motion. Neck supple. JVD present. Thyromegaly present.  Cardiovascular: Intact distal pulses. An irregularly irregular rhythm present.  Occasional extrasystoles are present. Tachycardia present.  Murmur heard. Pulses:      Dorsalis pedis pulses are 2+ on the right side, and 2+ on the left side.       Posterior tibial pulses are 2+ on the right side, and 2+ on the left side.  Variable S1. II/VI holosystolic murmur RLSB  Respiratory: Effort normal.  Breath sound diminished at bases   GI: Soft. Bowel sounds are normal. She exhibits no distension. There is no tenderness.  Musculoskeletal: She exhibits edema (2+ b/l).  Lymphadenopathy:    She has no cervical adenopathy.  Neurological: She is alert and oriented to person, place, and time. No cranial nerve deficit.  Skin: Skin is warm and dry.  Psychiatric: She has a normal mood and affect.  Assessment: Hyperthyroidism with Graves' disease  Heart failure with preserved ejection fraction, possibly high output failure due to hyperthyroidism Atrial fibrillation with RVR, due to hyperthyroidism. CH2DS2VASc score 5, annual stroke risk 7.2% Moderate pulmonary hypertension. Likely WHO group 2 secondary to the above.  Anemia of chronic disease Hb 9-10 g/dl AKI/CKD: Now resolved Type 2 diabetes mellitus Moderate obesity   Recommendations: Recommend rate control strategy. Continue atenolol at 50 mg by mouth twice a day. Recommend IV diltiazem for rate control. Could switch to SR 120 mg twice daily once her rate better controlled. When switching, recommend continuing IV drip for at least 2 hours after first dose of by mouth diltiazem. Would like to avoid cardioversion if possible given only recent initiation of anticoagulation, as well as persistence of hypothyroidism as the primary etiology for her A. fib with RVR. Would only consider TEE and cardioversionIn if she continues in RVR in spite of optimal medical therapy. Continue Eliquis 5 mg twice a day for anticoagulation. Need to monitor her hemoglobin while on anticoagulation. Currently, no obvious signs of bleeding. Optimal management of hypethyroidism is crucial to controlling rate as well as likely high output failure. Will need monitoring and possible further workup for pulmonary hypertension outpatient.  Continue rest of the management per primary team.  Will establish cardiology care outpatient on discharge.  Iya Hamed J Gunther Zawadzki 04/21/2017, 5:09 PM    [place], MD [facility] Pager: [phone] Office: [phone] If no answer Cell [phone]

## 2017-04-21 NOTE — ED Notes (Signed)
Provider notified of pt's increase work of breathing.  Pt seems to do better sitting straight up.  Provider bedside

## 2017-04-21 NOTE — Progress Notes (Signed)
Have attempted x3 to contact RN re PICC line request.  RN needs to place IV Team consult in with direct number to contact for information.

## 2017-04-21 NOTE — ED Notes (Signed)
Nurse will try to pull blood from IV. 

## 2017-04-21 NOTE — ED Notes (Signed)
IV team bedside. 

## 2017-04-21 NOTE — H&P (Signed)
History and Physical    Donna Booker RRN:[id number] DOB: 1945/06/30 DOA: 04/20/2017  Referring MD/NP/PA:   PCP: Filomena Jungling, NP   Patient coming from:  The patient is coming from home.  At baseline, pt is independent for most of ADL.   Chief Complaint: SOB  HPI: Donna Booker is a 72 y.o. female with medical history significant of Hypertension, hyperlipidemia, diabetes mellitus, GERD, atrial fibrillation on Eliquis, obesity, who who presents with shortness breath.  Pt was hospitalized from 04/03-4/19 due to new onset of atrial fibrillation. Patient was found to have hypothyroidism and started on methimazole. Patient was discharged on Eliquis and atenolol yesterday morning. Patient comes back tonight with shortness breath. Patient does not wear oxygen at home, but she needs 2 L nasal cannula oxygen to maintain oxygen saturation. Patient has cough with clear mucus production. Denies chest pain, fever or chills. She has bilateral leg edema. Of note, she had negative lower extremity venous Doppler for DVT in previous admission. Patient denies nausea, vomiting, diarrhea, abdominal pain, symptoms of UTI or unilateral weakness.  ED Course: pt was found to have 1050, negative troponin, WBC 13.1, creatinine normal, temperature normal, tachycardia, tachypnea, oxygen saturation 97% on 2 L nasal cannula oxygen. Chest x-ray showed pulmonary interstitial edema, small right pleural effusion and right hemidiaphragm elevation. D-dimer positive, pending CTA. Patient is admitted to telemetry bed as inpatient.  Review of Systems:   General: no fevers, chills, has poor appetite, has fatigue HEENT: no blurry vision, hearing changes or sore throat Respiratory: has dyspnea, coughing, no wheezing CV: no chest pain, no palpitations GI: no nausea, vomiting, abdominal pain, diarrhea, constipation GU: no dysuria, burning on urination, increased urinary frequency, hematuria  Ext: has leg edema Neuro: no  unilateral weakness, numbness, or tingling, no vision change or hearing loss Skin: no rash, no skin tear. MSK: No muscle spasm, no deformity, no limitation of range of movement in spin Heme: No easy bruising.  Travel history: No recent long distant travel.  Allergy:  Allergies  Allergen Reactions  . Tramadol Nausea And Vomiting    Past Medical History:  Diagnosis Date  . Diabetes mellitus   . Hypertension     Past Surgical History:  Procedure Laterality Date  . BREAST SURGERY      Social History:  reports that she has never smoked. She has never used smokeless tobacco. She reports that she does not drink alcohol or use drugs.  Family History:  Family History  Problem Relation Age of Onset  . Diabetes Mellitus II Mother   . Diabetes Mellitus II Father   . Hypertension Father   . Hyperlipidemia Father      Prior to Admission medications   Medication Sig Start Date End Date Taking? Authorizing Provider  apixaban (ELIQUIS) 5 MG TABS tablet Take 1 tablet (5 mg total) by mouth 2 (two) times daily. 04/20/17  Yes Calvert Cantor, MD  aspirin EC 81 MG tablet Take 81 mg by mouth daily.   Yes [provider]  atenolol (TENORMIN) 50 MG tablet Take 1 tablet (50 mg total) by mouth 2 (two) times daily. 04/20/17  Yes Calvert Cantor, MD  dextromethorphan (DELSYM) 30 MG/5ML liquid Take 2.5 mLs (15 mg total) by mouth 2 (two) times daily. 04/20/17  Yes Calvert Cantor, MD  diclofenac sodium (VOLTAREN) 1 % GEL Apply 2 g topically 4 (four) times daily. Patient taking differently: Apply 2 g topically 4 (four) times daily as needed (pain).  01/27/15  Yes Hillard Danker  A, MD  diltiazem (CARDIZEM CD) 180 MG 24 hr capsule Take 1 capsule (180 mg total) by mouth daily. Patient taking differently: Take 180 mg by mouth at bedtime.  04/21/17  Yes Calvert Cantor, MD  esomeprazole (NEXIUM) 40 MG capsule Take 40 mg by mouth daily before breakfast.   Yes [provider]  fluticasone  furoate-vilanterol (BREO ELLIPTA) 200-25 MCG/INH AEPB Inhale 1 puff into the lungs daily. 04/21/17  Yes Calvert Cantor, MD  gabapentin (NEURONTIN) 300 MG capsule Take 300 mg by mouth 3 (three) times daily. 02/22/17  Yes [provider]  glipiZIDE (GLUCOTROL) 5 MG tablet Take 1 tablet (5 mg total) by mouth 2 (two) times daily before a meal. NEED ANNUAL VISIT FOR REFILLS Patient taking differently: Take 5 mg by mouth daily before breakfast. NEED ANNUAL VISIT FOR REFILLS 12/13/16  Yes Myrlene Broker, MD  Insulin Detemir (LEVEMIR) 100 UNIT/ML Pen Inject 35 Units into the skin daily at 10 pm. 06/05/16  Yes Carlus Pavlov, MD  loratadine (CLARITIN) 10 MG tablet Take 1 tablet (10 mg total) by mouth daily. Patient taking differently: Take 10 mg by mouth at bedtime.  04/21/17  Yes Calvert Cantor, MD  metFORMIN (GLUCOPHAGE) 1000 MG tablet TAKE 1 TABLET DAILY WITH SUPPER Patient taking differently: Take 1,000 mg by mouth 2 (two) times daily with a meal.  01/07/16  Yes Carlus Pavlov, MD  methimazole (TAPAZOLE) 10 MG tablet Take 2 tablets (20 mg total) by mouth daily. 04/21/17  Yes Calvert Cantor, MD  ondansetron (ZOFRAN-ODT) 4 MG disintegrating tablet Take 1 tablet (4 mg total) by mouth every 8 (eight) hours as needed for nausea or vomiting. 01/10/17  Yes Benjiman Core, MD  pravastatin (PRAVACHOL) 40 MG tablet Take 1 tablet (40 mg total) by mouth daily. 10/28/15  Yes Myrlene Broker, MD    Physical Exam: Vitals:   04/21/17 0156 04/21/17 0215 04/21/17 0300 04/21/17 0315  BP: (!) 188/106 140/90 (!) 183/65 (!) 156/68  Pulse: 90 (!) 101 75 64  Resp: (!) 25 (!) 26 19 (!) 22  Temp:      TempSrc:      SpO2: 100% 99% 100% 100%  Weight:      Height:       General: Not in acute distress HEENT:       Eyes: PERRL, EOMI, no scleral icterus.       ENT: No discharge from the ears and nose, no pharynx injection, no tonsillar enlargement.        Neck: No JVD, no bruit, no mass felt. Heme: No  neck lymph node enlargement. Cardiac: S1/S2, irregularly irregular rhythm, No murmurs, No gallops or rubs. Respiratory:  No rales, wheezing, rhonchi or rubs. GI: Soft, nondistended, nontender, no rebound pain, no organomegaly, BS present. GU: No hematuria Ext: 1+ pitting leg edema bilaterally. 2+DP/PT pulse bilaterally. Musculoskeletal: No joint deformities, No joint redness or warmth, no limitation of ROM in spin. Skin: No rashes.  Neuro: Alert, oriented X3, cranial nerves II-XII grossly intact, moves all extremities normally.  Psych: Patient is not psychotic, no suicidal or hemocidal ideation.  Labs on Admission: I have personally reviewed following labs and imaging studies  CBC: Recent Labs  Lab 04/15/17 0014 04/15/17 0452 04/16/17 0427 04/17/17 0756 04/18/17 0347 04/20/17 2322  WBC 9.5 9.1 12.6* 12.2* 8.4 13.1*  NEUTROABS 3.8  --   --   --   --  8.3*  HGB 8.7* 8.1* 8.0* 8.4* 8.0* 9.7*  HCT 27.6* 25.9* 26.3* 28.9* 26.6*  32.0*  MCV 75.4* 76.4* 76.9* 77.5* 77.6* 75.7*  PLT 240 224 233 207 220 234   Basic Metabolic Panel: Recent Labs  Lab 04/15/17 0014 04/15/17 0452 04/16/17 0427 04/16/17 1250 04/17/17 0756 04/18/17 0347 04/20/17 2322  NA 139 140 141  --  138 136 140  K 4.2 4.0 3.9  --  4.4 4.9 4.0  CL 109 109 110  --  109 110 111  CO2 20* 20* 20*  --  18* 18* 18*  GLUCOSE 168* 185* 129*  --  146* 284* 293*  BUN 22* 21* 27*  --  37* 42* 40*  CREATININE 0.82 0.84 1.40*  --  1.53* 1.17* 0.95  CALCIUM 9.6 9.3 9.5  --  9.5 9.3 9.8  MG 1.5*  --   --  2.2 2.2 2.2  --   PHOS 4.9*  --   --   --  5.7*  --   --    GFR: Estimated Creatinine Clearance: 63.3 mL/min (by C-G formula based on SCr of 0.95 mg/dL). Liver Function Tests: Recent Labs  Lab 04/15/17 0014 04/16/17 0427 04/17/17 0756 04/18/17 0347  AST 34 27 27 33  ALT 26 25 26 30   ALKPHOS 81 76 75 84  BILITOT 0.9 0.6 0.8 0.7  PROT 6.2* 6.4* 6.5 6.3*  ALBUMIN 2.8* 2.9* 3.0* 2.8*   No results for input(s):  LIPASE, AMYLASE in the last 168 hours. No results for input(s): AMMONIA in the last 168 hours. Coagulation Profile: No results for input(s): INR, PROTIME in the last 168 hours. Cardiac Enzymes: Recent Labs  Lab 04/15/17 0452  TROPONINI <0.03   BNP (last 3 results) No results for input(s): PROBNP in the last 8760 hours. HbA1C: No results for input(s): HGBA1C in the last 72 hours. CBG: Recent Labs  Lab 04/19/17 1557 04/19/17 1944 04/19/17 2046 04/20/17 0744 04/20/17 1102  GLUCAP 236* 319* 287* 159* 197*   Lipid Profile: No results for input(s): CHOL, HDL, LDLCALC, TRIG, CHOLHDL, LDLDIRECT in the last 72 hours. Thyroid Function Tests: No results for input(s): TSH, T4TOTAL, FREET4, T3FREE, THYROIDAB in the last 72 hours. Anemia Panel: No results for input(s): VITAMINB12, FOLATE, FERRITIN, TIBC, IRON, RETICCTPCT in the last 72 hours. Urine analysis:    Component Value Date/Time   COLORURINE STRAW (A) 04/21/2017 0124   APPEARANCEUR CLEAR 04/21/2017 0124   LABSPEC 1.008 04/21/2017 0124   PHURINE 5.0 04/21/2017 0124   GLUCOSEU NEGATIVE 04/21/2017 0124   HGBUR SMALL (A) 04/21/2017 0124   BILIRUBINUR NEGATIVE 04/21/2017 0124   KETONESUR NEGATIVE 04/21/2017 0124   PROTEINUR 30 (A) 04/21/2017 0124   UROBILINOGEN 0.2 05/04/2012 0728   NITRITE NEGATIVE 04/21/2017 0124   LEUKOCYTESUR NEGATIVE 04/21/2017 0124   Sepsis Labs: @LABRCNTIP (procalcitonin:4,lacticidven:4) ) Recent Results (from the past 240 hour(s))  MRSA PCR Screening     Status: None   Collection Time: 04/15/17  6:37 PM  Result Value Ref Range Status   MRSA by PCR NEGATIVE NEGATIVE Final    Comment:        The GeneXpert MRSA Assay (FDA approved for NASAL specimens only), is one component of a comprehensive MRSA colonization surveillance program. It is not intended to diagnose MRSA infection nor to guide or monitor treatment for MRSA infections. Performed at [facility], [address],  [place], [place] [address]      Radiological Exams on Admission: Dg Chest 2 View  Result Date: 04/21/2017 CLINICAL DATA:  Acute onset of shortness of breath. EXAM: CHEST - 2  VIEW COMPARISON:  Chest radiograph performed 04/18/2017 FINDINGS: The lungs are well-aerated. There is mild elevation of the right hemidiaphragm. Increased interstitial markings may reflect mild interstitial edema. A small right pleural effusion is noted. There is no evidence of pneumothorax. The heart is normal in size; the mediastinal contour is within normal limits. No acute osseous abnormalities are seen. Cervical spinal fusion hardware is noted. IMPRESSION: Mild elevation of the right hemidiaphragm. Increased interstitial markings may reflect mild interstitial edema. Small right pleural effusion noted. Electronically Signed   By: Roanna Raider M.D.   On: 04/21/2017 00:03   [place] Ekg Site Rite  Result Date: 04/21/2017 If Site Rite image not attached, placement could not be confirmed due to current cardiac rhythm.    EKG: Not done in ED, will get one. Atrial fibrillation, QTC 469, nonspecific T-wave change.  Assessment/Plan Principal Problem:   Acute on chronic respiratory failure with hypoxia (HCC) Active Problems:   HTN (hypertension)   Hyperlipidemia   Morbid obesity (HCC)   Uncontrolled type 2 diabetes mellitus with diabetic polyneuropathy, with long-term current use of insulin (HCC)   Atrial fibrillation with RVR (HCC)   Hyperthyroidism   New onset of congestive heart failure (HCC)   Leukocytosis   Acute on chronic respiratory failure with hypoxia (HCC): etiology is not clear. Likely due to new onset CHF given bilateral leg edema, elevated BNP and interstitial edema on chest x-ray. Pt had 2-D echo on 04/16/17 showed EF 69%, Possible pulmonary hypertension, but the test was technically not sufficient to allow evaluation of LV diastolic dysfunction. Pt may have dCHF. D-dimer is positive 1.21 in ED. EPD ordered CTA for  ruling out PE. Pt has difficult IV, could not put in large IV for power injection. Will order PICC line placement.   -will admit to tele bed as inpt. -Lasix 40 mg daily by IV -trop x 3 -2d echo -start lisinopril 5 mg daily -Daily weights -strict I/O's -Low salt diet -f/u CAT and LE doppler  Uncontrolled type 2 diabetes mellitus with diabetic polyneuropathy, with long-term current use of insulin: Last A1c 8.0 on 02/01/16, poorly controled. Patient is taking metformin, glipizide and Levemir at home -will decrease levemir dose from 35-25 units daily  -SSI  HTN:  -Continue home medications:atenolol, and Cardizem, -Newly started lisinopril -IV hydralazine prn  HLD: -pravastatin  Atrial fibrillation with RVR: CHA2DS2-VASc Score is 3, needs oral anticoagulation. Patient is on Eliquis at home. Heart rate is 100-110s controlled. -continue Eliquis and atenolol  Hyperthyroidism: newly Diagnosed in remission on admission. TSH<0.01 and Free T 5.15 and T3 18.5 on 04/17/27 -continue methimazole -Repeat TSH in 6-8 weeks.  Leukocytosis: WBC 13.0. No fever. Chest x-ray no infiltration. Likely due to stress-induced demargination. -Follow-up by CBC -Follow-up UA   DVT ppx: on Eliquis Code Status: Full code Family Communication: None at bed side.  Disposition Plan:  Anticipate discharge back to previous home environment Consults called:  none Admission status: inpt/ tele     Date of Service 04/21/2017    Lorretta Harp [facility] Pager [phone]  If 7PM-7AM, please contact night-coverage [email or web address] Password [email or web address] 04/21/2017, 3:35 AM

## 2017-04-22 ENCOUNTER — Encounter (HOSPITAL_COMMUNITY): Payer: BLUE CROSS/BLUE SHIELD

## 2017-04-22 LAB — CBC WITH DIFFERENTIAL/PLATELET
BASOS PCT: 1 %
Basophils Absolute: 0.1 10*3/uL (ref 0.0–0.1)
EOS PCT: 3 %
Eosinophils Absolute: 0.4 10*3/uL (ref 0.0–0.7)
HCT: 27.4 % — ABNORMAL LOW (ref 36.0–46.0)
Hemoglobin: 8.6 g/dL — ABNORMAL LOW (ref 12.0–15.0)
LYMPHS ABS: 5.5 10*3/uL — AB (ref 0.7–4.0)
Lymphocytes Relative: 37 %
MCH: 23.5 pg — AB (ref 26.0–34.0)
MCHC: 31.4 g/dL (ref 30.0–36.0)
MCV: 74.9 fL — AB (ref 78.0–100.0)
MONO ABS: 1.3 10*3/uL — AB (ref 0.1–1.0)
Monocytes Relative: 9 %
Neutro Abs: 7.6 10*3/uL (ref 1.7–7.7)
Neutrophils Relative %: 50 %
PLATELETS: 216 10*3/uL (ref 150–400)
RBC: 3.66 MIL/uL — AB (ref 3.87–5.11)
RDW: 15.2 % (ref 11.5–15.5)
WBC: 14.9 10*3/uL — AB (ref 4.0–10.5)

## 2017-04-22 LAB — GLUCOSE, CAPILLARY
Glucose-Capillary: 99 mg/dL (ref 65–99)
Glucose-Capillary: 137 mg/dL — ABNORMAL HIGH (ref 65–99)
Glucose-Capillary: 168 mg/dL — ABNORMAL HIGH (ref 65–99)
Glucose-Capillary: 337 mg/dL — ABNORMAL HIGH (ref 65–99)

## 2017-04-22 LAB — BASIC METABOLIC PANEL
Anion gap: 12 (ref 5–15)
BUN: 27 mg/dL — ABNORMAL HIGH (ref 6–20)
CALCIUM: 8.9 mg/dL (ref 8.9–10.3)
CO2: 23 mmol/L (ref 22–32)
CREATININE: 0.89 mg/dL (ref 0.44–1.00)
Chloride: 106 mmol/L (ref 101–111)
GFR calc Af Amer: >60 mL/min (ref >60)
GFR calc non Af Amer: >60 mL/min (ref >60)
Glucose, Bld: 106 mg/dL — ABNORMAL HIGH (ref 65–99)
Potassium: 4 mmol/L (ref 3.5–5.1)
Sodium: 141 mmol/L (ref 135–145)

## 2017-04-22 LAB — MAGNESIUM: Magnesium: 1.2 mg/dL — ABNORMAL LOW (ref 1.7–2.4)

## 2017-04-22 MED ORDER — MAGNESIUM SULFATE 4 GM/100ML IV SOLN
4.0000 g | Freq: Once | INTRAVENOUS | Status: AC
  Administered 2017-04-22: 4 g via INTRAVENOUS
  Filled 2017-04-22: qty 100

## 2017-04-22 MED ORDER — DILTIAZEM HCL ER 60 MG PO CP12
120.0000 mg | ORAL_CAPSULE | Freq: Two times a day (BID) | ORAL | Status: DC
  Filled 2017-04-22: qty 2

## 2017-04-22 MED ORDER — DILTIAZEM HCL ER 60 MG PO CP12
120.0000 mg | ORAL_CAPSULE | Freq: Two times a day (BID) | ORAL | Status: DC
  Administered 2017-04-22 – 2017-04-24 (×4): 120 mg via ORAL
  Filled 2017-04-22 (×5): qty 2

## 2017-04-22 NOTE — Progress Notes (Signed)
Subjective:  Feels better. Breathing improved.   Objective:  Vital Signs in the last 24 hours: Temp:  [98.2 F (36.8 C)-99.4 F (37.4 C)] 98.2 F (36.8 C) (04/21 0812) Pulse Rate:  [79-125] 108 (04/21 0858) Resp:  [20-22] 20 (04/20 1949) BP: (109-190)/(44-99) 165/74 (04/21 1129) SpO2:  [99 %-100 %] 100 % (04/21 0812) Weight:  [93.1 kg (205 lb 3.2 oz)] 93.1 kg (205 lb 3.2 oz) (04/20 1320)  Intake/Output from previous day: 04/20 0701 - 04/21 0700 In: 304.6 [P.O.:240; I.V.:64.6] Out: 3400 [Urine:3400] Intake/Output from this shift: No intake/output data recorded.  Physical Exam: Constitutional: She is oriented to person, place, and time. She appears well-developed and well-nourished.  Sick appearing  HENT:  Head: Normocephalic and atraumatic.  Eyes: Pupils are equal, round, and reactive to light. Conjunctivae are normal.  Neck: Normal range of motion. Neck supple. JVD present. Thyromegaly present.  Cardiovascular: Intact distal pulses. An irregularly irregular rhythm present.  Occasional extrasystoles are present. Tachycardia present.  Murmur heard. Pulses:      Dorsalis pedis pulses are 2+ on the right side, and 2+ on the left side.       Posterior tibial pulses are 2+ on the right side, and 2+ on the left side.  Variable S1. II/VI holosystolic murmur RLSB  Respiratory: Effort normal.  Breath sound diminished at bases      Lab Results: Recent Labs    04/20/17 2322 04/22/17 0754  WBC 13.1* 14.9*  HGB 9.7* 8.6*  PLT 234 216   Recent Labs    04/20/17 2322 04/22/17 0442  NA 140 141  K 4.0 4.0  CL 111 106  CO2 18* 23  GLUCOSE 293* 106*  BUN 40* 27*  CREATININE 0.95 0.89   Recent Labs    04/21/17 1114 04/21/17 1116  TROPONINI <0.03 <0.03     Cardiac Studies: EKG 04/20/2017: Atrial fibrillation with RVR. Normal axis. Normal conduction. Nonspecific ST-T changes lateral leads. No ischemia.  Echocardiogram 04/16/2017: Study Conclusions  - Left  ventricle: The cavity size was normal. There was mild concentric hypertrophy. Systolic function was normal. The estimated ejection fraction was in the range of 60% to 65%. Wall motion was normal; there were no regional wall motion abnormalities. The study was not technically sufficient to allow evaluation of LV diastolic dysfunction due to atrial fibrillation. - Mitral valve: Calcified annulus. There was mild regurgitation. - Left atrium: The atrium was moderately dilated. - Right atrium: The atrium was moderately dilated. - Tricuspid valve: There was moderate regurgitation. - Pulmonic valve: There was trivial regurgitation. - Pulmonary arteries: PA peak pressure: 48 mm Hg (S).  Impressions:  - The right ventricular systolic pressure was increased consistent with moderate pulmonary hypertension.  Assessment: Hyperthyroidism with Graves' disease  Heart failure with preserved ejection fraction, possibly high output failure due to hyperthyroidism Atrial fibrillation with RVR, due to hyperthyroidism. CH2DS2VASc score 5, annual stroke risk 7.2%: Rate better controlled on IV diltiazem ggt. Moderate pulmonary hypertension. Likely WHO group 2 secondary to the above.  Anemia of chronic disease Hb 9-10 g/dl AKI/CKD: Now resolved Type 2 diabetes mellitus Moderate obesity   Recommendations: Recommend rate control strategy. Continue atenolol at 50 mg by mouth twice a day.  Start diltiazem SR 120 mg bid at 1800 today. Wean off the diltiazem drip by 04/22 morning. Continue Eliquis 5 mg twice a day for anticoagulation. Need to monitor her hemoglobin while on anticoagulation. Currently, no obvious signs of bleeding. Optimal management of hypethyroidism is crucial to controlling  rate as well as likely high output failure. Will need monitoring and possible further workup for pulmonary hypertension outpatient.  Continue rest of the management per primary team.  Will establish  cardiology care outpatient on discharge.     LOS: 1 day    Connie Hilgert J Jason Frisbee 04/22/2017, 12:11 PM  Quantae Martel Emiliano Dyer, MD [facility] Pager: [phone] Office: [phone] If no answer Cell [phone]

## 2017-04-22 NOTE — Progress Notes (Signed)
PROGRESS NOTE    Donna Booker  ACZ:[id number] DOB: 03-11-1945 DOA: 04/20/2017 PCP: Filomena Jungling, NP   Brief Narrative:72 y.o. female with medical history significant of Hypertension, hyperlipidemia, diabetes mellitus, GERD, atrial fibrillation on Eliquis, obesity, who who presents with shortness breath.  Pt was hospitalized from 04/03-4/19 due to new onset of atrial fibrillation. Patient was found to have hypothyroidism and started on methimazole. Patient was discharged on Eliquis and atenolol yesterday morning. Patient comes back tonight with shortness breath. Patient does not wear oxygen at home, but she needs 2 L nasal cannula oxygen to maintain oxygen saturation. Patient has cough with clear mucus production. Denies chest pain, fever or chills. She has bilateral leg edema. Of note, she had negative lower extremity venous Doppler for DVT in previous admission. Patient denies nausea, vomiting, diarrhea, abdominal pain, symptoms of UTI or unilateral weakness.  ED Course: pt was found to have 1050, negative troponin, WBC 13.1, creatinine normal, temperature normal, tachycardia, tachypnea, oxygen saturation 97% on 2 L nasal cannula oxygen. Chest x-ray showed pulmonary interstitial edema, small right pleural effusion and right hemidiaphragm elevation. D-dimer positive, pending CTA. Patient is admitted to telemetry bed as inpatient.    Assessment & Plan:   Principal Problem:   Acute on chronic respiratory failure with hypoxia (HCC) Active Problems:   HTN (hypertension)   Hyperlipidemia   Morbid obesity (HCC)   Uncontrolled type 2 diabetes mellitus with diabetic polyneuropathy, with long-term current use of insulin (HCC)   Atrial fibrillation with RVR (HCC)   Hyperthyroidism   New onset of congestive heart failure (HCC)   Leukocytosis   1] A. fib with RVR secondary to Graves' disease.  Her heart rate has been much controlled with IV Cardizem drip.  Continue Cardizem per cardiology.   Continue beta-blocker.  Continue Lasix.  Replete magnesium.  2] new onset CHF secondary to rapid atrial fibrillation-continue diuresis and rate control.  3] hypertension continue cardiac medications  4] type 2 diabetes continue insulin.   DVT prophylaxis: Eliquis Code Status: Full code Family Communication: No family available today Disposition Plan: TBD Consultants: Cardiology  Procedures: None Antimicrobials: None  Subjective: Feels a lot better breathing is better slept better.   Objective: Vitals:   04/22/17 0858 04/22/17 0900 04/22/17 1129 04/22/17 1341  BP: 134/78 134/78 (!) 165/74 (!) 127/51  Pulse: (!) 108   87  Resp:      Temp:    98.6 F (37 C)  TempSrc:    Oral  SpO2:      Weight:      Height:        Intake/Output Summary (Last 24 hours) at 04/22/2017 1425 Last data filed at 04/22/2017 0655 Gross per 24 hour  Intake 184.58 ml  Output 1800 ml  Net -1615.42 ml   Filed Weights   04/20/17 2224 04/21/17 1320  Weight: 97.5 kg (215 lb) 93.1 kg (205 lb 3.2 oz)    Examination:  General exam: Appears calm and comfortable  Respiratory system crackles at the bases auscultation. Respiratory effort normal. Cardiovascular system: S1 & S2 heard, RRR. No JVD, murmurs, rubs, gallops or clicks. No pedal edema. Gastrointestinal system: Abdomen is nondistended, soft and nontender. No organomegaly or masses felt. Normal bowel sounds heard. Central nervous system: Alert and oriented. No focal neurological deficits. Extremities: Symmetric 5 x 5 power. Skin: No rashes, lesions or ulcers Psychiatry: Judgement and insight appear normal. Mood & affect appropriate.     Data Reviewed: I have personally reviewed following labs and  imaging studies  CBC: Recent Labs  Lab 04/16/17 0427 04/17/17 0756 04/18/17 0347 04/20/17 2322 04/22/17 0754  WBC 12.6* 12.2* 8.4 13.1* 14.9*  NEUTROABS  --   --   --  8.3* 7.6  HGB 8.0* 8.4* 8.0* 9.7* 8.6*  HCT 26.3* 28.9* 26.6* 32.0*  27.4*  MCV 76.9* 77.5* 77.6* 75.7* 74.9*  PLT 233 207 220 234 216   Basic Metabolic Panel: Recent Labs  Lab 04/16/17 0427 04/16/17 1250 04/17/17 0756 04/18/17 0347 04/20/17 2322 04/22/17 0442  NA 141  --  138 136 140 141  K 3.9  --  4.4 4.9 4.0 4.0  CL 110  --  109 110 111 106  CO2 20*  --  18* 18* 18* 23  GLUCOSE 129*  --  146* 284* 293* 106*  BUN 27*  --  37* 42* 40* 27*  CREATININE 1.40*  --  1.53* 1.17* 0.95 0.89  CALCIUM 9.5  --  9.5 9.3 9.8 8.9  MG  --  2.2 2.2 2.2  --  1.2*  PHOS  --   --  5.7*  --   --   --    GFR: Estimated Creatinine Clearance: 66 mL/min (by C-G formula based on SCr of 0.89 mg/dL). Liver Function Tests: Recent Labs  Lab 04/16/17 0427 04/17/17 0756 04/18/17 0347  AST 27 27 33  ALT 25 26 30   ALKPHOS 76 75 84  BILITOT 0.6 0.8 0.7  PROT 6.4* 6.5 6.3*  ALBUMIN 2.9* 3.0* 2.8*   No results for input(s): LIPASE, AMYLASE in the last 168 hours. No results for input(s): AMMONIA in the last 168 hours. Coagulation Profile: No results for input(s): INR, PROTIME in the last 168 hours. Cardiac Enzymes: Recent Labs  Lab 04/21/17 0413 04/21/17 1114 04/21/17 1116  TROPONINI <0.03 <0.03 <0.03   BNP (last 3 results) No results for input(s): PROBNP in the last 8760 hours. HbA1C: No results for input(s): HGBA1C in the last 72 hours. CBG: Recent Labs  Lab 04/21/17 1318 04/21/17 1823 04/21/17 2110 04/22/17 0722 04/22/17 1132  GLUCAP 180* 208* 157* 99 137*   Lipid Profile: No results for input(s): CHOL, HDL, LDLCALC, TRIG, CHOLHDL, LDLDIRECT in the last 72 hours. Thyroid Function Tests: No results for input(s): TSH, T4TOTAL, FREET4, T3FREE, THYROIDAB in the last 72 hours. Anemia Panel: No results for input(s): VITAMINB12, FOLATE, FERRITIN, TIBC, IRON, RETICCTPCT in the last 72 hours. Sepsis Labs: No results for input(s): PROCALCITON, LATICACIDVEN in the last 168 hours.  Recent Results (from the past 240 hour(s))  MRSA PCR Screening      Status: None   Collection Time: 04/15/17  6:37 PM  Result Value Ref Range Status   MRSA by PCR NEGATIVE NEGATIVE Final    Comment:        The GeneXpert MRSA Assay (FDA approved for NASAL specimens only), is one component of a comprehensive MRSA colonization surveillance program. It is not intended to diagnose MRSA infection nor to guide or monitor treatment for MRSA infections. Performed at [facility], [address], [place], [place] [address]          Radiology Studies: Dg Chest 2 View  Result Date: 04/21/2017 CLINICAL DATA:  Acute onset of shortness of breath. EXAM: CHEST - 2 VIEW COMPARISON:  Chest radiograph performed 04/18/2017 FINDINGS: The lungs are well-aerated. There is mild elevation of the right hemidiaphragm. Increased interstitial markings may reflect mild interstitial edema. A small right pleural effusion is noted. There is no evidence of  pneumothorax. The heart is normal in size; the mediastinal contour is within normal limits. No acute osseous abnormalities are seen. Cervical spinal fusion hardware is noted. IMPRESSION: Mild elevation of the right hemidiaphragm. Increased interstitial markings may reflect mild interstitial edema. Small right pleural effusion noted. Electronically Signed   By: Roanna Raider M.D.   On: 04/21/2017 00:03   Ct Angio Chest Pe W/cm &/or Wo Cm  Result Date: 04/21/2017 CLINICAL DATA:  SOB, c/o respiratory distress. Pt discharged from [facility] this mornign after spending "a few days" on the floor. Hx A-fib. EXAM: CT ANGIOGRAPHY CHEST WITH CONTRAST TECHNIQUE: Multidetector CT imaging of the chest was performed using the standard protocol during bolus administration of intravenous contrast. Multiplanar CT image reconstructions and MIPs were obtained to evaluate the vascular anatomy. CONTRAST:  ISOVUE-370 IOPAMIDOL (ISOVUE-370) INJECTION 76% COMPARISON:  03/04/2017 FINDINGS: Cardiovascular: Satisfactory opacification of pulmonary  arteries noted, and there is no evidence of pulmonary emboli. Scattered mild coronary calcifications. Adequate contrast opacification of the thoracic aorta with no evidence of dissection, aneurysm, or stenosis. There is classic 3-vessel brachiocephalic arch anatomy without proximal stenosis. Minimal Aortic Atherosclerosis (ICD10-170.0) calcified plaque in the distal arch and descending segment. Mediastinum/Nodes: No pericardial effusion. No hilar or mediastinal adenopathy. Thyromegaly. Lungs/Pleura: Moderate right and trace left pleural effusions. No pneumothorax. Loculated fluid in the minor and right major fissures resulting in pseudotumor. No pulmonary nodule. Dependent atelectasis posteriorly in the right lower lobe. Upper Abdomen: No acute findings Musculoskeletal: Anterior vertebral endplate spurring at multiple levels in the mid and lower thoracic spine. Negative for fracture or worrisome bone lesion. Review of the MIP images confirms the above findings. IMPRESSION: 1. Negative for acute PE or thoracic aortic dissection. 2. Aortic Atherosclerosis (ICD10-170.0) and coronary artery disease. Please note that although the presence of coronary artery calcium documents the presence of coronary artery disease, the severity of this disease and any potential stenosis cannot be assessed on this non-gated CT examination. Assessment for potential risk factor modification, dietary therapy or pharmacologic therapy may be warranted, if clinically indicated. 3. Pleural effusions right greater than left, new since previous. Electronically Signed   By: Corlis Leak M.D.   On: 04/21/2017 13:08   [place] Ekg Site Rite  Result Date: 04/21/2017 If Site Rite image not attached, placement could not be confirmed due to current cardiac rhythm.       Scheduled Meds: . apixaban  5 mg Oral BID  . aspirin EC  81 mg Oral Daily  . atenolol  50 mg Oral BID  . diltiazem  120 mg Oral Q12H  . fluticasone furoate-vilanterol  1 puff  Inhalation Daily  . furosemide  40 mg Intravenous BID  . gabapentin  300 mg Oral TID  . insulin aspart  0-9 Units Subcutaneous TID WC  . insulin detemir  25 Units Subcutaneous Q2200  . lisinopril  5 mg Oral Daily  . loratadine  10 mg Oral QHS  . methimazole  20 mg Oral Daily  . pantoprazole  40 mg Oral Daily  . potassium chloride  20 mEq Oral BID  . pravastatin  40 mg Oral q1800  . sodium chloride flush  10-40 mL Intracatheter Q12H  . sodium chloride flush  3 mL Intravenous Q12H   Continuous Infusions: . sodium chloride    . diltiazem (CARDIZEM) infusion 5 mg/hr (04/22/17 1407)     LOS: 1 day      Alwyn Ren, MD [facility]  If 7PM-7AM, please contact night-coverage  [email or web address] Password [email or web address] 04/22/2017, 2:25 PM

## 2017-04-23 LAB — BASIC METABOLIC PANEL
Anion gap: 10 (ref 5–15)
BUN: 28 mg/dL — ABNORMAL HIGH (ref 6–20)
CHLORIDE: 104 mmol/L (ref 101–111)
CO2: 24 mmol/L (ref 22–32)
Calcium: 8.6 mg/dL — ABNORMAL LOW (ref 8.9–10.3)
Creatinine, Ser: 0.97 mg/dL (ref 0.44–1.00)
GFR calc Af Amer: >60 mL/min (ref >60)
GFR calc non Af Amer: 58 mL/min — ABNORMAL LOW (ref >60)
Glucose, Bld: 204 mg/dL — ABNORMAL HIGH (ref 65–99)
POTASSIUM: 3.5 mmol/L (ref 3.5–5.1)
SODIUM: 138 mmol/L (ref 135–145)

## 2017-04-23 LAB — CBC WITH DIFFERENTIAL/PLATELET
BASOS ABS: 0 10*3/uL (ref 0.0–0.1)
Basophils Relative: 0 %
Eosinophils Absolute: 0.4 10*3/uL (ref 0.0–0.7)
Eosinophils Relative: 3 %
HEMATOCRIT: 29.1 % — AB (ref 36.0–46.0)
Hemoglobin: 9 g/dL — ABNORMAL LOW (ref 12.0–15.0)
LYMPHS PCT: 35 %
Lymphs Abs: 4.4 10*3/uL — ABNORMAL HIGH (ref 0.7–4.0)
MCH: 23.4 pg — ABNORMAL LOW (ref 26.0–34.0)
MCHC: 30.9 g/dL (ref 30.0–36.0)
MCV: 75.6 fL — AB (ref 78.0–100.0)
MONO ABS: 1 10*3/uL (ref 0.1–1.0)
Monocytes Relative: 8 %
NEUTROS ABS: 6.9 10*3/uL (ref 1.7–7.7)
Neutrophils Relative %: 54 %
Platelets: 196 10*3/uL (ref 150–400)
RBC: 3.85 MIL/uL — AB (ref 3.87–5.11)
RDW: 15.3 % (ref 11.5–15.5)
WBC: 12.7 10*3/uL — AB (ref 4.0–10.5)

## 2017-04-23 LAB — GLUCOSE, CAPILLARY
GLUCOSE-CAPILLARY: 180 mg/dL — AB (ref 65–99)
GLUCOSE-CAPILLARY: 152 mg/dL — AB (ref 65–99)
GLUCOSE-CAPILLARY: 261 mg/dL — AB (ref 65–99)
Glucose-Capillary: 253 mg/dL — ABNORMAL HIGH (ref 65–99)

## 2017-04-23 LAB — MAGNESIUM: MAGNESIUM: 2 mg/dL (ref 1.7–2.4)

## 2017-04-23 MED ORDER — ATENOLOL 25 MG PO TABS
50.0000 mg | ORAL_TABLET | Freq: Three times a day (TID) | ORAL | Status: DC
  Administered 2017-04-23 – 2017-04-24 (×4): 50 mg via ORAL
  Filled 2017-04-23 (×3): qty 2

## 2017-04-23 MED ORDER — ATENOLOL 50 MG PO TABS: 50.0000 mg | ORAL_TABLET | Freq: Three times a day (TID) | ORAL | 3 refills | Status: DC

## 2017-04-23 MED ORDER — FUROSEMIDE 40 MG PO TABS
40.0000 mg | ORAL_TABLET | Freq: Two times a day (BID) | ORAL | Status: DC
  Administered 2017-04-23 – 2017-04-24 (×3): 40 mg via ORAL
  Filled 2017-04-23 (×3): qty 1

## 2017-04-23 MED ORDER — DILTIAZEM HCL ER 120 MG PO CP12: 120.0000 mg | ORAL_CAPSULE | Freq: Two times a day (BID) | ORAL | 3 refills | Status: DC

## 2017-04-23 NOTE — Progress Notes (Signed)
Pt given PO Cardizem dose per order last evening, Cardizem gtt titrated down and stopped at 0215. Vitals stable, HR afib 70-90. Pt comfortable. Will continue to monitor.

## 2017-04-23 NOTE — Progress Notes (Signed)
Subjective:  Feels better. Breathing improved. Resting heart rate in 90s, increases to 110 with standing.   Objective:  Vital Signs in the last 24 hours: Temp:  [97.6 F (36.4 C)-98.8 F (37.1 C)] 97.6 F (36.4 C) (04/22 0723) Pulse Rate:  [69-108] 86 (04/22 0723) Resp:  [14-18] 14 (04/22 0438) BP: (105-165)/(51-98) 147/95 (04/22 0723) SpO2:  [97 %-100 %] 100 % (04/22 0723) Weight:  [90.4 kg (199 lb 6.4 oz)-91.6 kg (201 lb 15.1 oz)] 90.4 kg (199 lb 6.4 oz) (04/22 0438)  Intake/Output from previous day: 04/21 0701 - 04/22 0700 In: 447 [P.O.:240; I.V.:107; IV Piggyback:100] Out: 200 [Urine:200] Intake/Output from this shift: No intake/output data recorded.  Physical Exam: Constitutional: She is oriented to person, place, and time. She appears well-developed and well-nourished.  Sick appearing  HENT:  Head: Normocephalic and atraumatic.  Eyes: Pupils are equal, round, and reactive to light. Conjunctivae are normal.  Neck: Normal range of motion. Neck supple. No JVD present. Thyromegaly present.  Cardiovascular: Intact distal pulses. An irregularly irregular rhythm present.  Occasional extrasystoles are present. Tachycardia present.  Variable S1. II/VI holosystolic murmur RLSB  Pulses:      Dorsalis pedis pulses are 2+ on the right side, and 2+ on the left side.       Posterior tibial pulses are 2+ on the right side, and 2+ on the left side.  No edema Respiratory: Effort normal.  Breath sound diminished at bases      Lab Results: Recent Labs    04/22/17 0754 04/23/17 0502  WBC 14.9* 12.7*  HGB 8.6* 9.0*  PLT 216 196   Recent Labs    04/22/17 0442 04/23/17 0502  NA 141 138  K 4.0 3.5  CL 106 104  CO2 23 24  GLUCOSE 106* 204*  BUN 27* 28*  CREATININE 0.89 0.97   Recent Labs    04/21/17 1114 04/21/17 1116  TROPONINI <0.03 <0.03     Cardiac Studies: EKG 04/20/2017: Atrial fibrillation with RVR. Normal axis. Normal conduction. Nonspecific ST-T changes  lateral leads. No ischemia.  Echocardiogram 04/16/2017: Study Conclusions  - Left ventricle: The cavity size was normal. There was mild concentric hypertrophy. Systolic function was normal. The estimated ejection fraction was in the range of 60% to 65%. Wall motion was normal; there were no regional wall motion abnormalities. The study was not technically sufficient to allow evaluation of LV diastolic dysfunction due to atrial fibrillation. - Mitral valve: Calcified annulus. There was mild regurgitation. - Left atrium: The atrium was moderately dilated. - Right atrium: The atrium was moderately dilated. - Tricuspid valve: There was moderate regurgitation. - Pulmonic valve: There was trivial regurgitation. - Pulmonary arteries: PA peak pressure: 48 mm Hg (S).  Impressions:  - The right ventricular systolic pressure was increased consistent with moderate pulmonary hypertension.  Assessment: Hyperthyroidism with Graves' disease  Heart failure with preserved ejection fraction, possibly high output failure due to hyperthyroidism Atrial fibrillation with RVR, due to hyperthyroidism. CH2DS2VASc score 5, annual stroke risk 7.2%: Moderate pulmonary hypertension. Likely WHO group 2 secondary to the above.  Anemia of chronic disease Hb 9-10 g/dl AKI/CKD: Now resolved Type 2 diabetes mellitus Moderate obesity   Recommendations: Recommend rate control strategy. Increase atenolol 50 mg tid. Continue diltiazem SR 120 mg twice daily (Prescriptions sent to [facility], [address] as requested by the patient) Continue Eliquis 5 mg twice a day for anticoagulation. Need to monitor her hemoglobin while on anticoagulation. Currently, no obvious signs of bleeding. Switch  IV lasix 40 mg bid to 50 mg PO bid Optimal management of hypethyroidism is crucial to controlling rate as well as likely high output failure. Will need monitoring and possible further workup for pulmonary  hypertension outpatient.  Continue rest of the management per primary team.    Cardiology will sign off. Follow up appt made on 04/27/17 at 3:00 PM     LOS: 2 days    Albaraa Swingle J Sharronda Schweers 04/23/2017, 8:42 AM  Elder Negus, MD [facility] Pager: [phone] Office: [phone] If no answer Cell [phone]

## 2017-04-23 NOTE — Progress Notes (Signed)
PROGRESS NOTE    SHAYANNA THATCH  YHC:[id number] DOB: [birth year] DOA: 04/20/2017 PCP: Filomena Jungling, NP  Brief Narrative:  72 y.o.femalewith medical history significant ofHypertension, hyperlipidemia, diabetes mellitus, GERD, atrial fibrillation on Eliquis, obesity, who who presents with shortness breath.  Pt was hospitalized from 04/03-4/19 due to new onset of atrial fibrillation. Patient was found to have hypothyroidism and started on methimazole. Patient was discharged on Eliquis and atenolol yesterday morning. Patient comes back tonight with shortness breath. Patient does not wear oxygen at home, but she needs 2 L nasal cannula oxygen to maintain oxygen saturation. Patient has cough with clear mucus production. Denies chest pain, fever or chills. She has bilateral leg edema. Of note, she had negative lower extremity venous Doppler for DVT in previous admission. Patient denies nausea, vomiting, diarrhea, abdominal pain, symptoms of UTI or unilateral weakness.  ED Course:pt was found to have1050, negative troponin, WBC 13.1, creatinine normal, temperature normal, tachycardia, tachypnea, oxygen saturation 97% on 2 L nasal cannula oxygen. Chest x-ray showed pulmonary interstitial edema, small right pleural effusion and right hemidiaphragm elevation. D-dimer positive, pending CTA. Patient is admitted to telemetry bed as inpatient.    Assessment & Plan:   Principal Problem:   Acute on chronic respiratory failure with hypoxia (HCC) Active Problems:   HTN (hypertension)   Hyperlipidemia   Morbid obesity (HCC)   Uncontrolled type 2 diabetes mellitus with diabetic polyneuropathy, with long-term current use of insulin (HCC)   Atrial fibrillation with RVR (HCC)   Hyperthyroidism   New onset of congestive heart failure (HCC)   Leukocytosis  1] A. fib with RVR secondary to Graves' disease.  Her heart rate has been much controlled with IV Cardizem drip.  Continue Cardizem per cardiology.   Continue beta-blocker.  Continue Lasix.  Replete magnesium.  2] new onset CHF secondary to rapid atrial fibrillation-continue diuresis and rate control.  Noted cardiology increase Lasix to 50 twice daily appreciate their input.  3] hypertension continue cardiac medications  4] type 2 diabetes continue insulin.      DVT prophylaxis:eliquis Code Status: full Family Communication: none Disposition Plan: tbd  Consultants:  cardiology Procedures:none Antimicrobials:none  Subjective: Feels better breathing is better slept better.   Objective: Sitting with a side of the bed in no acute distress. Vitals:   04/23/17 0438 04/23/17 0723 04/23/17 1057 04/23/17 1133  BP: (!) 158/81 (!) 147/95  (!) 149/109  Pulse: 76 86  99  Resp: 14     Temp: 98.8 F (37.1 C) 97.6 F (36.4 C)  98.5 F (36.9 C)  TempSrc: Oral Oral  Oral  SpO2: 97% 100% 97% 95%  Weight: 90.4 kg (199 lb 6.4 oz)     Height:        Intake/Output Summary (Last 24 hours) at 04/23/2017 1347 Last data filed at 04/23/2017 1011 Gross per 24 hour  Intake 697 ml  Output 600 ml  Net 97 ml   Filed Weights   04/21/17 1320 04/22/17 1958 04/23/17 0438  Weight: 93.1 kg (205 lb 3.2 oz) 91.6 kg (201 lb 15.1 oz) 90.4 kg (199 lb 6.4 oz)    Examination:  General exam: Appears calm and comfortable  Respiratory system: Clear to auscultation. Respiratory effort normal. Cardiovascular system: Crackles at the bases bilaterally.  S1 & S2 heard, RRR. No JVD, murmurs, rubs, gallops or clicks. No pedal edema. Gastrointestinal system: Abdomen is nondistended, soft and nontender. No organomegaly or masses felt. Normal bowel sounds heard. Central nervous system: Alert and oriented. No  focal neurological deficits. Extremities: Symmetric 5 x 5 power. Skin: No rashes, lesions or ulcers Psychiatry: Judgement and insight appear normal. Mood & affect appropriate.     Data Reviewed: I have personally reviewed following labs and imaging  studies  CBC: Recent Labs  Lab 04/17/17 0756 04/18/17 0347 04/20/17 2322 04/22/17 0754 04/23/17 0502  WBC 12.2* 8.4 13.1* 14.9* 12.7*  NEUTROABS  --   --  8.3* 7.6 6.9  HGB 8.4* 8.0* 9.7* 8.6* 9.0*  HCT 28.9* 26.6* 32.0* 27.4* 29.1*  MCV 77.5* 77.6* 75.7* 74.9* 75.6*  PLT 207 220 234 216 196   Basic Metabolic Panel: Recent Labs  Lab 04/17/17 0756 04/18/17 0347 04/20/17 2322 04/22/17 0442 04/23/17 0502  NA 138 136 140 141 138  K 4.4 4.9 4.0 4.0 3.5  CL 109 110 111 106 104  CO2 18* 18* 18* 23 24  GLUCOSE 146* 284* 293* 106* 204*  BUN 37* 42* 40* 27* 28*  CREATININE 1.53* 1.17* 0.95 0.89 0.97  CALCIUM 9.5 9.3 9.8 8.9 8.6*  MG 2.2 2.2  --  1.2* 2.0  PHOS 5.7*  --   --   --   --    GFR: Estimated Creatinine Clearance: 59.6 mL/min (by C-G formula based on SCr of 0.97 mg/dL). Liver Function Tests: Recent Labs  Lab 04/17/17 0756 04/18/17 0347  AST 27 33  ALT 26 30  ALKPHOS 75 84  BILITOT 0.8 0.7  PROT 6.5 6.3*  ALBUMIN 3.0* 2.8*   No results for input(s): LIPASE, AMYLASE in the last 168 hours. No results for input(s): AMMONIA in the last 168 hours. Coagulation Profile: No results for input(s): INR, PROTIME in the last 168 hours. Cardiac Enzymes: Recent Labs  Lab 04/21/17 0413 04/21/17 1114 04/21/17 1116  TROPONINI <0.03 <0.03 <0.03   BNP (last 3 results) No results for input(s): PROBNP in the last 8760 hours. HbA1C: No results for input(s): HGBA1C in the last 72 hours. CBG: Recent Labs  Lab 04/22/17 1132 04/22/17 1628 04/22/17 2122 04/23/17 0719 04/23/17 1130  GLUCAP 137* 168* 337* 180* 152*   Lipid Profile: No results for input(s): CHOL, HDL, LDLCALC, TRIG, CHOLHDL, LDLDIRECT in the last 72 hours. Thyroid Function Tests: No results for input(s): TSH, T4TOTAL, FREET4, T3FREE, THYROIDAB in the last 72 hours. Anemia Panel: No results for input(s): VITAMINB12, FOLATE, FERRITIN, TIBC, IRON, RETICCTPCT in the last 72 hours. Sepsis Labs: No results  for input(s): PROCALCITON, LATICACIDVEN in the last 168 hours.  Recent Results (from the past 240 hour(s))  MRSA PCR Screening     Status: None   Collection Time: 04/15/17  6:37 PM  Result Value Ref Range Status   MRSA by PCR NEGATIVE NEGATIVE Final    Comment:        The GeneXpert MRSA Assay (FDA approved for NASAL specimens only), is one component of a comprehensive MRSA colonization surveillance program. It is not intended to diagnose MRSA infection nor to guide or monitor treatment for MRSA infections. Performed at [facility], [address] [place], [place] [address]          Radiology Studies: No results found.      Scheduled Meds: . apixaban  5 mg Oral BID  . aspirin EC  81 mg Oral Daily  . atenolol  50 mg Oral TID  . diltiazem  120 mg Oral Q12H  . fluticasone furoate-vilanterol  1 puff Inhalation Daily  . furosemide  40 mg Oral BID  . gabapentin  300 mg  Oral TID  . insulin aspart  0-9 Units Subcutaneous TID WC  . insulin detemir  25 Units Subcutaneous Q2200  . lisinopril  5 mg Oral Daily  . loratadine  10 mg Oral QHS  . methimazole  20 mg Oral Daily  . pantoprazole  40 mg Oral Daily  . potassium chloride  20 mEq Oral BID  . pravastatin  40 mg Oral q1800  . sodium chloride flush  10-40 mL Intracatheter Q12H  . sodium chloride flush  3 mL Intravenous Q12H   Continuous Infusions: . sodium chloride Stopped (04/23/17 0217)     LOS: 2 days     Alwyn Ren, MD [facility] If 7PM-7AM, please contact night-coverage [email or web address] Password [facility] 04/23/2017, 1:47 PM

## 2017-04-24 LAB — BASIC METABOLIC PANEL
ANION GAP: 9 (ref 5–15)
BUN: 30 mg/dL — ABNORMAL HIGH (ref 6–20)
CO2: 25 mmol/L (ref 22–32)
Calcium: 9 mg/dL (ref 8.9–10.3)
Chloride: 104 mmol/L (ref 101–111)
Creatinine, Ser: 1.11 mg/dL — ABNORMAL HIGH (ref 0.44–1.00)
GFR calc Af Amer: 57 mL/min — ABNORMAL LOW (ref >60)
GFR, EST NON AFRICAN AMERICAN: 49 mL/min — AB (ref >60)
GLUCOSE: 173 mg/dL — AB (ref 65–99)
POTASSIUM: 3.9 mmol/L (ref 3.5–5.1)
Sodium: 138 mmol/L (ref 135–145)

## 2017-04-24 LAB — CBC WITH DIFFERENTIAL/PLATELET
BASOS ABS: 0 10*3/uL (ref 0.0–0.1)
Basophils Relative: 0 %
Eosinophils Absolute: 0.3 10*3/uL (ref 0.0–0.7)
Eosinophils Relative: 3 %
HEMATOCRIT: 29.2 % — AB (ref 36.0–46.0)
HEMOGLOBIN: 8.9 g/dL — AB (ref 12.0–15.0)
LYMPHS PCT: 36 %
Lymphs Abs: 4.3 10*3/uL — ABNORMAL HIGH (ref 0.7–4.0)
MCH: 23.2 pg — ABNORMAL LOW (ref 26.0–34.0)
MCHC: 30.5 g/dL (ref 30.0–36.0)
MCV: 76 fL — AB (ref 78.0–100.0)
MONO ABS: 1.1 10*3/uL — AB (ref 0.1–1.0)
Monocytes Relative: 9 %
NEUTROS ABS: 6.2 10*3/uL (ref 1.7–7.7)
NEUTROS PCT: 52 %
Platelets: 204 10*3/uL (ref 150–400)
RBC: 3.84 MIL/uL — AB (ref 3.87–5.11)
RDW: 14.7 % (ref 11.5–15.5)
WBC: 11.9 10*3/uL — AB (ref 4.0–10.5)

## 2017-04-24 LAB — MAGNESIUM: Magnesium: 1.7 mg/dL (ref 1.7–2.4)

## 2017-04-24 LAB — GLUCOSE, CAPILLARY
GLUCOSE-CAPILLARY: 251 mg/dL — AB (ref 65–99)
Glucose-Capillary: 153 mg/dL — ABNORMAL HIGH (ref 65–99)

## 2017-04-24 MED ORDER — ATENOLOL 50 MG PO TABS: 50.0000 mg | ORAL_TABLET | Freq: Three times a day (TID) | ORAL | 1 refills | Status: DC

## 2017-04-24 MED ORDER — MAGNESIUM SULFATE 4 GM/100ML IV SOLN: 4.0000 g | Freq: Once | INTRAVENOUS | Status: DC

## 2017-04-24 MED ORDER — ATENOLOL 50 MG PO TABS: 50.0000 mg | ORAL_TABLET | Freq: Three times a day (TID) | ORAL | 0 refills | Status: DC

## 2017-04-24 MED ORDER — PRAVASTATIN SODIUM 40 MG PO TABS: 40.0000 mg | ORAL_TABLET | Freq: Every day | ORAL | 0 refills | Status: DC

## 2017-04-24 MED ORDER — INSULIN DETEMIR 100 UNIT/ML ~~LOC~~ SOLN: 25.0000 [IU] | Freq: Every day | SUBCUTANEOUS | 1 refills | Status: DC

## 2017-04-24 MED ORDER — LISINOPRIL 5 MG PO TABS: 5.0000 mg | ORAL_TABLET | Freq: Every day | ORAL | 1 refills | Status: DC

## 2017-04-24 MED ORDER — INSULIN DETEMIR 100 UNIT/ML ~~LOC~~ SOLN: 25.0000 [IU] | Freq: Every day | SUBCUTANEOUS | 11 refills | Status: DC

## 2017-04-24 MED ORDER — MAGNESIUM OXIDE 400 (241.3 MG) MG PO TABS
800.0000 mg | ORAL_TABLET | Freq: Two times a day (BID) | ORAL | Status: DC
  Administered 2017-04-24: 800 mg via ORAL
  Filled 2017-04-24: qty 2

## 2017-04-24 MED ORDER — DILTIAZEM HCL ER 120 MG PO CP12: 120.0000 mg | ORAL_CAPSULE | Freq: Two times a day (BID) | ORAL | 4 refills | Status: DC

## 2017-04-24 MED ORDER — DICLOFENAC SODIUM 1 % TD GEL: 2.0000 g | Freq: Four times a day (QID) | TRANSDERMAL | Status: DC | PRN

## 2017-04-24 MED ORDER — POTASSIUM CHLORIDE CRYS ER 20 MEQ PO TBCR: 20.0000 meq | EXTENDED_RELEASE_TABLET | Freq: Two times a day (BID) | ORAL | 0 refills | Status: DC

## 2017-04-24 MED ORDER — PRAVASTATIN SODIUM 40 MG PO TABS: 40.0000 mg | ORAL_TABLET | Freq: Every day | ORAL | 1 refills | Status: DC

## 2017-04-24 MED ORDER — LORATADINE 10 MG PO TABS: 10.0000 mg | ORAL_TABLET | Freq: Every day | ORAL | Status: DC

## 2017-04-24 MED ORDER — FUROSEMIDE 40 MG PO TABS: 40.0000 mg | ORAL_TABLET | Freq: Two times a day (BID) | ORAL | 0 refills | Status: DC

## 2017-04-24 MED ORDER — POTASSIUM CHLORIDE CRYS ER 20 MEQ PO TBCR: 20.0000 meq | EXTENDED_RELEASE_TABLET | Freq: Two times a day (BID) | ORAL | 1 refills | Status: DC

## 2017-04-24 MED ORDER — LISINOPRIL 5 MG PO TABS: 5.0000 mg | ORAL_TABLET | Freq: Every day | ORAL | 0 refills | Status: DC

## 2017-04-24 NOTE — Care Management Note (Signed)
Case Management Note  Patient Details  Name: Donna Booker MRN: [id number] Date of Birth: July 19, 1945  Subjective/Objective:            Patient active w [facility] for RN PT, Patient is a readmission from home in less than 24 hours. [facility] made aware of readmission, will need resumption orders at DC. Patient has DME RW at home. Felt like she was SOB, states she was on O2 at time of DC 4-19. She is currently on RA. CM placed ambulatory sat order to assess O2 needs for DC. Will continue to follow.         Action/Plan:   Expected Discharge Date:                  Expected Discharge Plan:  Home w Home Health Services  In-House Referral:     Discharge planning Services  CM Consult  Post Acute Care Choice:  Home Health, Resumption of Svcs/PTA Provider Choice offered to:  Patient  DME Arranged:    DME Agency:     HH Arranged:  RN, PT HH Agency:  [facility]  Status of Service:  In process, will continue to follow  If discussed at Long Length of Stay Meetings, dates discussed:    Additional Comments:  Lawerance Sabal, RN 04/24/2017, 10:14 AM

## 2017-04-24 NOTE — Discharge Instructions (Signed)
Information on my medicine - ELIQUIS (apixaban)  This medication education was reviewed with me or my healthcare representative as part of my discharge preparation. Why was Eliquis prescribed for you? Eliquis was prescribed for you to reduce the risk of forming blood clots that can cause a stroke if you have a medical condition called atrial fibrillation (a type of irregular heartbeat) OR to reduce the risk of a blood clots forming after orthopedic surgery.  What do You need to know about Eliquis ? Take your Eliquis TWICE DAILY - one tablet in the morning and one tablet in the evening with or without food.  It would be best to take the doses about the same time each day.  If you have difficulty swallowing the tablet whole please discuss with your pharmacist how to take the medication safely.  Take Eliquis exactly as prescribed by your doctor and DO NOT stop taking Eliquis without talking to the doctor who prescribed the medication.  Stopping may increase your risk of developing a new clot or stroke.  Refill your prescription before you run out.  After discharge, you should have regular check-up appointments with your healthcare provider that is prescribing your Eliquis.  In the future your dose may need to be changed if your kidney function or weight changes by a significant amount or as you get older.  What do you do if you miss a dose? If you miss a dose, take it as soon as you remember on the same day and resume taking twice daily.  Do not take more than one dose of ELIQUIS at the same time.  Important Safety Information A possible side effect of Eliquis is bleeding. You should call your healthcare provider right away if you experience any of the following: ? Bleeding from an injury or your nose that does not stop. ? Unusual colored urine (red or dark brown) or unusual colored stools (red or black). ? Unusual bruising for unknown reasons. ? A serious fall or if you hit your head  (even if there is no bleeding).  Some medicines may interact with Eliquis and might increase your risk of bleeding or clotting while on Eliquis. To help avoid this, consult your healthcare provider or pharmacist prior to using any new prescription or non-prescription medications, including herbals, vitamins, non-steroidal anti-inflammatory drugs (NSAIDs) and supplements.  This website has more information on Eliquis (apixaban): [email or web address].   Atrial Fibrillation Atrial fibrillation is a type of heartbeat that is irregular or fast (rapid). If you have this condition, your heart keeps quivering in a weird (chaotic) way. This condition can make it so your heart cannot pump blood normally. Having this condition gives a person more risk for stroke, heart failure, and other heart problems. There are different types of atrial fibrillation. Talk with your doctor to learn about the type that you have. Follow these instructions at home:  Take over-the-counter and prescription medicines only as told by your doctor.  If your doctor prescribed a blood-thinning medicine, take it exactly as told. Taking too much of it can cause bleeding. If you do not take enough of it, you will not have the protection that you need against stroke and other problems.  Do not use any tobacco products. These include cigarettes, chewing tobacco, and e-cigarettes. If you need help quitting, ask your doctor.  If you have apnea (obstructive sleep apnea), manage it as told by your doctor.  Do not drink alcohol.  Do not drink beverages that have caffeine. These  include coffee, soda, and tea.  Maintain a healthy weight. Do not use diet pills unless your doctor says they are safe for you. Diet pills may make heart problems worse.  Follow diet instructions as told by your doctor.  Exercise regularly as told by your doctor.  Keep all follow-up visits as told by your doctor. This is important. Contact a doctor if:  You  notice a change in the speed, rhythm, or strength of your heartbeat.  You are taking a blood-thinning medicine and you notice more bruising.  You get tired more easily when you move or exercise. Get help right away if:  You have pain in your chest or your belly (abdomen).  You have sweating or weakness.  You feel sick to your stomach (nauseous).  You notice blood in your throw up (vomit), poop (stool), or pee (urine).  You are short of breath.  You suddenly have swollen feet and ankles.  You feel dizzy.  Your suddenly get weak or numb in your face, arms, or legs, especially if it happens on one side of your body.  You have trouble talking, trouble understanding, or both.  Your face or your eyelid droops on one side. These symptoms may be an emergency. Do not wait to see if the symptoms will go away. Get medical help right away. Call your local emergency services (911 in the [place] Do not drive yourself to the hospital. This information is not intended to replace advice given to you by your health care provider. Make sure you discuss any questions you have with your health care provider. Document Released: 09/28/2007 Document Revised: 05/27/2015 Document Reviewed: 04/15/2014 Elsevier Interactive Patient Education  2018 Elsevier Inc.    Heart Failure Heart failure means your heart has trouble pumping blood. This makes it hard for your body to work well. Heart failure is usually a long-term (chronic) condition. You must take good care of yourself and follow your doctor's treatment plan. Follow these instructions at home:  Take your heart medicine as told by your doctor. ? Do not stop taking medicine unless your doctor tells you to. ? Do not skip any dose of medicine. ? Refill your medicines before they run out. ? Take other medicines only as told by your doctor or pharmacist.  Stay active if told by your doctor. The elderly and people with severe heart failure should talk  with a doctor about physical activity.  Eat heart-healthy foods. Choose foods that are without trans fat and are low in saturated fat, cholesterol, and salt (sodium). This includes fresh or frozen fruits and vegetables, fish, lean meats, fat-free or low-fat dairy foods, whole grains, and high-fiber foods. Lentils and dried peas and beans (legumes) are also good choices.  Limit salt if told by your doctor.  Cook in a healthy way. Roast, grill, broil, bake, poach, steam, or stir-fry foods.  Limit fluids as told by your doctor.  Weigh yourself every morning. Do this after you pee (urinate) and before you eat breakfast. Write down your weight to give to your doctor.  Take your blood pressure and write it down if your doctor tells you to.  Ask your doctor how to check your pulse. Check your pulse as told.  Lose weight if told by your doctor.  Stop smoking or chewing tobacco. Do not use gum or patches that help you quit without your doctor's approval.  Schedule and go to doctor visits as told.  Nonpregnant women should have no more than 1  drink a day. Men should have no more than 2 drinks a day. Talk to your doctor about drinking alcohol.  Stop illegal drug use.  Stay current with shots (immunizations).  Manage your health conditions as told by your doctor.  Learn to manage your stress.  Rest when you are tired.  If it is really hot outside: ? Avoid intense activities. ? Use air conditioning or fans, or get in a cooler place. ? Avoid caffeine and alcohol. ? Wear loose-fitting, lightweight, and light-colored clothing.  If it is really cold outside: ? Avoid intense activities. ? Layer your clothing. ? Wear mittens or gloves, a hat, and a scarf when going outside. ? Avoid alcohol.  Learn about heart failure and get support as needed.  Get help to maintain or improve your quality of life and your ability to care for yourself as needed. Contact a doctor if:  You gain weight  quickly.  You are more short of breath than usual.  You cannot do your normal activities.  You tire easily.  You cough more than normal, especially with activity.  You have any or more puffiness (swelling) in areas such as your hands, feet, ankles, or belly (abdomen).  You cannot sleep because it is hard to breathe.  You feel like your heart is beating fast (palpitations).  You get dizzy or light-headed when you stand up. Get help right away if:  You have trouble breathing.  There is a change in mental status, such as becoming less alert or not being able to focus.  You have chest pain or discomfort.  You faint. This information is not intended to replace advice given to you by your health care provider. Make sure you discuss any questions you have with your health care provider. Document Released: 09/28/2007 Document Revised: 05/27/2015 Document Reviewed: 02/05/2012 Elsevier Interactive Patient Education  2017 Elsevier Inc.    Cooking With Less [employer] with less salt is one way to reduce the amount of sodium you get from food. Depending on your condition and overall health, your health care provider or diet and nutrition specialist (dietitian) may recommend that you reduce your sodium intake. Most people should have less than 2,300 milligrams (mg) of sodium each day. If you have high blood pressure (hypertension), you may need to limit your sodium to 1,500 mg each day. Follow the tips below to help reduce your sodium intake. What do I need to know about cooking with less salt? Shopping  Buy sodium-free or low-sodium products. Look for the following words on food labels: ? Low-sodium. ? Sodium-free. ? Reduced-sodium. ? No salt added. ? Unsalted.  Buy fresh or frozen vegetables. Avoid canned vegetables.  Avoid buying meats or protein foods that have been injected with broth or saline solution.  Avoid cured or smoked meats, such as hot dogs, bacon, salami, ham, and  bologna. Reading food labels  Check the food label before buying or using packaged ingredients.  Look for products with no more than 140 mg of sodium in one serving.  Do not choose foods with salt as one of the first three ingredients on the ingredients list. If salt is one of the first three ingredients, it usually means the item is high in sodium, because ingredients are listed in order of amount in the food item. Cooking  Use herbs, seasonings without salt, and spices as substitutes for salt in foods.  Use sodium-free baking soda when baking.  Grill, braise, or roast foods to add flavor  with less salt.  Avoid adding salt to pasta, rice, or hot cereals while cooking.  Drain and rinse canned vegetables before use.  Avoid adding salt when cooking sweets and desserts.  Cook with low-sodium ingredients. What are some salt alternatives? The following are herbs, seasonings, and spices that can be used instead of salt to give taste to your food. Herbs should be fresh or dried. Do not choose packaged mixes. Next to the name of the herb, spice, or seasoning are some examples of foods you can pair it with. Herbs  Bay leaves - Soups, meat and vegetable dishes, and spaghetti sauce.  Basil - [employer], soups, pasta, and fish dishes.  Cilantro - Meat, poultry, and vegetable dishes.  Chili powder - Marinades and Mexican dishes.  Chives - Salad dressings and potato dishes.  Cumin - Mexican dishes, couscous, and meat dishes.  Dill - Fish dishes, sauces, and salads.  Fennel - Meat and vegetable dishes, breads, and cookies.  Garlic (do not use garlic salt) - [place] dishes, meat dishes, salad dressings, and sauces.  Marjoram - Soups, potato dishes, and meat dishes.  Oregano - Pizza and spaghetti sauce.  Parsley - Salads, soups, pasta, and meat dishes.  Rosemary - [place] dishes, salad dressings, soups, and red meats.  Saffron - Fish dishes, pasta, and some poultry  dishes.  Sage - Stuffings and sauces.  Tarragon - Fish and [employer] Foods.  Thyme - Stuffing, meat, and fish dishes. Seasonings  Lemon juice - Fish dishes, poultry dishes, vegetables, and salads.  Vinegar - Salad dressings, vegetables, and fish dishes. Spices  Cinnamon - Sweet dishes, such as cakes, cookies, and puddings.  Cloves - Gingerbread, puddings, and marinades for meats.  Curry - Vegetable dishes, fish and poultry dishes, and stir-fry dishes.  Ginger - Vegetables dishes, fish dishes, and stir-fry dishes.  Nutmeg - Pasta, vegetables, poultry, fish dishes, and custard. What are some low-sodium ingredients and foods?  Fresh or frozen fruits and vegetables with no sauce added.  Fresh or frozen whole meats, poultry, and fish with no sauce added.  Eggs.  Noodles, pasta, quinoa, rice.  Shredded or puffed wheat or puffed rice.  Regular or quick oats.  Milk, yogurt, hard cheeses, and low-sodium cheeses. Good cheese choices include Swiss, [employer], and [address]. Always check the label for the serving size and sodium content.  Unsalted butter or margarine.  Unsalted nuts.  Sherbet or ice cream (keep to  cup per serving).  Homemade pudding.  Sodium-free baking soda and baking powder. This is not a complete list of low-sodium ingredients and foods. Contact your dietitian for more options. Summary  Cooking with less salt is one way to reduce the amount of sodium that you get from food.  Buy sodium-free or low-sodium products.  Check the food label before using or buying packaged ingredients.  Use herbs, seasonings without salt, and spices as substitutes for salt in foods. This information is not intended to replace advice given to you by your health care provider. Make sure you discuss any questions you have with your health care provider. Document Released: 12/19/2004 Document Revised: 12/28/2015 Document Reviewed: 12/28/2015 Elsevier Interactive Patient  Education  2017 [employer].

## 2017-04-24 NOTE — Progress Notes (Addendum)
SATURATION QUALIFICATIONS: (This note is used to comply with regulatory documentation for home oxygen)  Patient Saturations on Room Air at Rest = 98% PER NT [facility]  Patient Saturations on Room Air while Ambulating = 95%  Patient Saturations on N/A  Liters of oxygen while Ambulating = N/A  Please briefly explain why patient needs home oxygen:  DOES NOT QUALIFY

## 2017-04-24 NOTE — Discharge Summary (Signed)
Physician Discharge Summary  Donna Booker:[id number] DOB: 1945-07-20 DOA: 04/20/2017  PCP: Filomena Jungling, NP  Admit date: 04/20/2017 Discharge date: 04/24/2017  Admitted From: Home Disposition: Home Recommendations for Outpatient Follow-up:  1. Follow up with PCP in 1-2 weeks 2. Please obtain BMP/CBC in one week Home Health none Equipment/Devices: None Discharge Condition stable CODE STATUS: Full code Diet recommendation: Cardiac  Brief/Interim Summary: 72 y.o.femalewith medical history significant ofHypertension, hyperlipidemia, diabetes mellitus, GERD, atrial fibrillation on Eliquis, obesity, who who presents with shortness breath.  Pt was hospitalized from 04/03-4/19 due to new onset of atrial fibrillation. Patient was found to have hypothyroidism and started on methimazole. Patient was discharged on Eliquis and atenolol yesterday morning. Patient comes back tonight with shortness breath. Patient does not wear oxygen at home, but she needs 2 L nasal cannula oxygen to maintain oxygen saturation. Patient has cough with clear mucus production. Denies chest pain, fever or chills. She has bilateral leg edema. Of note, she had negative lower extremity venous Doppler for DVT in previous admission. Patient denies nausea, vomiting, diarrhea, abdominal pain, symptoms of UTI or unilateral weakness.  ED Course:pt was found to have1050, negative troponin, WBC 13.1, creatinine normal, temperature normal, tachycardia, tachypnea, oxygen saturation 97% on 2 L nasal cannula oxygen. Chest x-ray showed pulmonary interstitial edema, small right pleural effusion and right hemidiaphragm elevation. D-dimer positive, pending CTA. Patient is admitted to telemetry bed as inpatient.    Discharge Diagnoses:  Principal Problem:   Acute on chronic respiratory failure with hypoxia (HCC) Active Problems:   HTN (hypertension)   Hyperlipidemia   Morbid obesity (HCC)   Uncontrolled type 2 diabetes  mellitus with diabetic polyneuropathy, with long-term current use of insulin (HCC)   Atrial fibrillation with RVR (HCC)   Hyperthyroidism   New onset of congestive heart failure (HCC)   Leukocytosis  1]A. fib with RVR secondary to Graves' disease.  She was treated with IV Cardizem drip.  Her heart rate was controlled with a drip.  She is being discharged on Cardizem p.o. along with beta-blocker and ACE inhibitor.  Patient already has a follow-up appointment with the cardiologist 04/27/2017.  Continue Lasix and potassium magnesium has been repleted.  Continue Eliquis.  Oxygen saturation upon discharge was above 95% on room air while ambulating.  2]new onset CHF secondary to rapid atrial fibrillation-continue diuresis and rate control.    Continue Lasix.  3]hypertension continue cardiac medications as above.  4]type 2 diabetes continue Lantus.     Discharge Instructions  Discharge Instructions    Call MD for:  difficulty breathing, headache or visual disturbances   Complete by:  As directed    Call MD for:  persistant dizziness or light-headedness   Complete by:  As directed    Call MD for:  persistant nausea and vomiting   Complete by:  As directed    Call MD for:  severe uncontrolled pain   Complete by:  As directed    Call MD for:  temperature >100.4   Complete by:  As directed    Diet - low sodium heart healthy   Complete by:  As directed    Diet - low sodium heart healthy   Complete by:  As directed    Increase activity slowly   Complete by:  As directed    Increase activity slowly   Complete by:  As directed      Allergies as of 04/24/2017      Reactions   Tramadol Nausea And Vomiting  Medication List    STOP taking these medications   dextromethorphan 30 MG/5ML liquid Commonly known as:  DELSYM   diltiazem 180 MG 24 hr capsule Commonly known as:  CARDIZEM CD   metFORMIN 1000 MG tablet Commonly known as:  GLUCOPHAGE   ondansetron 4 MG  disintegrating tablet Commonly known as:  ZOFRAN-ODT     TAKE these medications   apixaban 5 MG Tabs tablet Commonly known as:  ELIQUIS Take 1 tablet (5 mg total) by mouth 2 (two) times daily.   aspirin EC 81 MG tablet Take 81 mg by mouth daily.   atenolol 50 MG tablet Commonly known as:  TENORMIN Take 1 tablet (50 mg total) by mouth 3 (three) times daily. What changed:  when to take this   atenolol 50 MG tablet Commonly known as:  TENORMIN Take 1 tablet (50 mg total) by mouth 3 (three) times daily. What changed:  You were already taking a medication with the same name, and this prescription was added. Make sure you understand how and when to take each.   diclofenac sodium 1 % Gel Commonly known as:  VOLTAREN Apply 2 g topically 4 (four) times daily as needed (pain).   diltiazem 120 MG 12 hr capsule Commonly known as:  CARDIZEM SR Take 1 capsule (120 mg total) by mouth every 12 (twelve) hours.   esomeprazole 40 MG capsule Commonly known as:  NEXIUM Take 40 mg by mouth daily before breakfast.   fluticasone furoate-vilanterol 200-25 MCG/INH Aepb Commonly known as:  BREO ELLIPTA Inhale 1 puff into the lungs daily.   furosemide 40 MG tablet Commonly known as:  LASIX Take 1 tablet (40 mg total) by mouth 2 (two) times daily.   gabapentin 300 MG capsule Commonly known as:  NEURONTIN Take 300 mg by mouth 3 (three) times daily.   glipiZIDE 5 MG tablet Commonly known as:  GLUCOTROL Take 1 tablet (5 mg total) by mouth 2 (two) times daily before a meal. NEED ANNUAL VISIT FOR REFILLS What changed:    when to take this  additional instructions   insulin detemir 100 UNIT/ML injection Commonly known as:  LEVEMIR Inject 0.25 mLs (25 Units total) into the skin daily at 10 pm. What changed:  how much to take   lisinopril 5 MG tablet Commonly known as:  PRINIVIL,ZESTRIL Take 1 tablet (5 mg total) by mouth daily. Start taking on:  04/25/2017   loratadine 10 MG  tablet Commonly known as:  CLARITIN Take 1 tablet (10 mg total) by mouth at bedtime.   methimazole 10 MG tablet Commonly known as:  TAPAZOLE Take 2 tablets (20 mg total) by mouth daily.   potassium chloride SA 20 MEQ tablet Commonly known as:  K-DUR,KLOR-CON Take 1 tablet (20 mEq total) by mouth 2 (two) times daily.   pravastatin 40 MG tablet Commonly known as:  PRAVACHOL Take 1 tablet (40 mg total) by mouth daily at 6 PM. What changed:  when to take this      Follow-up Information    Filomena Jungling, NP Follow up.   Specialty:  Nurse Practitioner Contact information: [address] [place] [address] [phone]        Elder Negus, MD Follow up.   Specialty:  Cardiology Contact information: [address] [place] [address] [place] [address] [phone]          Allergies  Allergen Reactions  . Tramadol Nausea And Vomiting    Consultations:   Procedures/Studies: Dg Chest 2  View  Result Date: 04/21/2017 CLINICAL DATA:  Acute onset of shortness of breath. EXAM: CHEST - 2 VIEW COMPARISON:  Chest radiograph performed 04/18/2017 FINDINGS: The lungs are well-aerated. There is mild elevation of the right hemidiaphragm. Increased interstitial markings may reflect mild interstitial edema. A small right pleural effusion is noted. There is no evidence of pneumothorax. The heart is normal in size; the mediastinal contour is within normal limits. No acute osseous abnormalities are seen. Cervical spinal fusion hardware is noted. IMPRESSION: Mild elevation of the right hemidiaphragm. Increased interstitial markings may reflect mild interstitial edema. Small right pleural effusion noted. Electronically Signed   By: Roanna Raider M.D.   On: 04/21/2017 00:03   Ct Angio Chest Pe W/cm &/or Wo Cm  Result Date: 04/21/2017 CLINICAL DATA:  SOB, c/o respiratory distress. Pt [facility]ign after spending "a few days" on the floor. Hx A-fib. EXAM:  CT ANGIOGRAPHY CHEST WITH CONTRAST TECHNIQUE: Multidetector CT imaging of the chest was performed using the standard protocol during bolus administration of intravenous contrast. Multiplanar CT image reconstructions and MIPs were obtained to evaluate the vascular anatomy. CONTRAST:  ISOVUE-370 IOPAMIDOL (ISOVUE-370) INJECTION 76% COMPARISON:  03/04/2017 FINDINGS: Cardiovascular: Satisfactory opacification of pulmonary arteries noted, and there is no evidence of pulmonary emboli. Scattered mild coronary calcifications. Adequate contrast opacification of the thoracic aorta with no evidence of dissection, aneurysm, or stenosis. There is classic 3-vessel brachiocephalic arch anatomy without proximal stenosis. Minimal Aortic Atherosclerosis (ICD10-170.0) calcified plaque in the distal arch and descending segment. Mediastinum/Nodes: No pericardial effusion. No hilar or mediastinal adenopathy. Thyromegaly. Lungs/Pleura: Moderate right and trace left pleural effusions. No pneumothorax. Loculated fluid in the minor and right major fissures resulting in pseudotumor. No pulmonary nodule. Dependent atelectasis posteriorly in the right lower lobe. Upper Abdomen: No acute findings Musculoskeletal: Anterior vertebral endplate spurring at multiple levels in the mid and lower thoracic spine. Negative for fracture or worrisome bone lesion. Review of the MIP images confirms the above findings. IMPRESSION: 1. Negative for acute PE or thoracic aortic dissection. 2. Aortic Atherosclerosis (ICD10-170.0) and coronary artery disease. Please note that although the presence of coronary artery calcium documents the presence of coronary artery disease, the severity of this disease and any potential stenosis cannot be assessed on this non-gated CT examination. Assessment for potential risk factor modification, dietary therapy or pharmacologic therapy may be warranted, if clinically indicated. 3. Pleural effusions right greater than left,  new since previous. Electronically Signed   By: Corlis Leak M.D.   On: 04/21/2017 13:08   Dg Chest Port 1 View  Result Date: 04/18/2017 CLINICAL DATA:  Shortness of breath, pulmonary edema. EXAM: PORTABLE CHEST 1 VIEW COMPARISON:  Radiograph of April 15, 2017. FINDINGS: Stable cardiomediastinal silhouette. No pneumothorax is noted. Atherosclerosis of thoracic aorta is noted. Increased bibasilar opacities, right greater than left, are noted consistent with pulmonary edema or atelectasis. No significant pleural effusion is noted. Bony thorax is unremarkable. IMPRESSION: Increased bibasilar opacities, right greater than left, consistent with worsening pulmonary edema or atelectasis. Aortic Atherosclerosis (ICD10-I70.0). Electronically Signed   By: Lupita Raider, M.D.   On: 04/18/2017 08:37   Dg Chest Port 1 View  Result Date: 04/15/2017 CLINICAL DATA:  Shortness of breath.  Productive cough. EXAM: PORTABLE CHEST 1 VIEW COMPARISON:  Radiographs and CT 03/04/2017 FINDINGS: Lower lung volumes from prior exam with bronchovascular crowding. Upper normal heart size with mild aortic atherosclerosis. Trace fluid in the right minor fissure. Minimal right infrahilar atelectasis.  No confluent consolidation, pleural effusion or pneumothorax. No acute osseous abnormalities. IMPRESSION: Lower lung volumes from prior exam with bronchovascular crowding. Upper normal heart size with aortic atherosclerosis. Electronically Signed   By: Rubye Oaks M.D.   On: 04/15/2017 00:58   [place] Ekg Site Rite  Result Date: 04/21/2017 If Site Rite image not attached, placement could not be confirmed due to current cardiac rhythm.   (Echo, Carotid, EGD, Colonoscopy, ERCP)    Subjective:   Discharge Exam: Vitals:   04/24/17 1039 04/24/17 1120  BP:  138/70  Pulse:  (!) 104  Resp:    Temp:  99.2 F (37.3 C)  SpO2: 97% 100%   Vitals:   04/24/17 0731 04/24/17 1029 04/24/17 1039 04/24/17 1120  BP: (!) 153/110 (!) 185/80   138/70  Pulse: 88   (!) 104  Resp:      Temp: 98.8 F (37.1 C)   99.2 F (37.3 C)  TempSrc: Oral   Oral  SpO2: 99%  97% 100%  Weight:      Height:        General: Pt is alert, awake, not in acute distress Cardiovascular: RRR, S1/S2 +, no rubs, no gallops Respiratory: CTA bilaterally, no wheezing, no rhonchi Abdominal: Soft, NT, ND, bowel sounds + Extremities: no edema, no cyanosis    The results of significant diagnostics from this hospitalization (including imaging, microbiology, ancillary and laboratory) are listed below for reference.     Microbiology: Recent Results (from the past 240 hour(s))  MRSA PCR Screening     Status: None   Collection Time: 04/15/17  6:37 PM  Result Value Ref Range Status   MRSA by PCR NEGATIVE NEGATIVE Final    Comment:        The GeneXpert MRSA Assay (FDA approved for NASAL specimens only), is one component of a comprehensive MRSA colonization surveillance program. It is not intended to diagnose MRSA infection nor to guide or monitor treatment for MRSA infections. Performed at [facility], [address], [place], [place] [address]      Labs: BNP (last 3 results) Recent Labs    03/04/17 0934 04/20/17 2322  BNP 216.3* 1,050.9*   Basic Metabolic Panel: Recent Labs  Lab 04/18/17 0347 04/20/17 2322 04/22/17 0442 04/23/17 0502 04/24/17 0434  NA 136 140 141 138 138  K 4.9 4.0 4.0 3.5 3.9  CL 110 111 106 104 104  CO2 18* 18* 23 24 25   GLUCOSE 284* 293* 106* 204* 173*  BUN 42* 40* 27* 28* 30*  CREATININE 1.17* 0.95 0.89 0.97 1.11*  CALCIUM 9.3 9.8 8.9 8.6* 9.0  MG 2.2  --  1.2* 2.0 1.7   Liver Function Tests: Recent Labs  Lab 04/18/17 0347  AST 33  ALT 30  ALKPHOS 84  BILITOT 0.7  PROT 6.3*  ALBUMIN 2.8*   No results for input(s): LIPASE, AMYLASE in the last 168 hours. No results for input(s): AMMONIA in the last 168 hours. CBC: Recent Labs  Lab 04/18/17 0347 04/20/17 2322 04/22/17 0754 04/23/17 0502  04/24/17 0434  WBC 8.4 13.1* 14.9* 12.7* 11.9*  NEUTROABS  --  8.3* 7.6 6.9 6.2  HGB 8.0* 9.7* 8.6* 9.0* 8.9*  HCT 26.6* 32.0* 27.4* 29.1* 29.2*  MCV 77.6* 75.7* 74.9* 75.6* 76.0*  PLT 220 234 216 196 204   Cardiac Enzymes: Recent Labs  Lab 04/21/17 0413 04/21/17 1114 04/21/17 1116  TROPONINI <0.03 <0.03 <0.03   BNP: Invalid input(s): POCBNP CBG: Recent Labs  Lab  04/23/17 1130 04/23/17 1633 04/23/17 2050 04/24/17 0726 04/24/17 1116  GLUCAP 152* 261* 253* 153* 251*   D-Dimer No results for input(s): DDIMER in the last 72 hours. Hgb A1c No results for input(s): HGBA1C in the last 72 hours. Lipid Profile No results for input(s): CHOL, HDL, LDLCALC, TRIG, CHOLHDL, LDLDIRECT in the last 72 hours. Thyroid function studies No results for input(s): TSH, T4TOTAL, T3FREE, THYROIDAB in the last 72 hours.  Invalid input(s): FREET3 Anemia work up No results for input(s): VITAMINB12, FOLATE, FERRITIN, TIBC, IRON, RETICCTPCT in the last 72 hours. Urinalysis    Component Value Date/Time   COLORURINE STRAW (A) 04/21/2017 0124   APPEARANCEUR CLEAR 04/21/2017 0124   LABSPEC 1.008 04/21/2017 0124   PHURINE 5.0 04/21/2017 0124   GLUCOSEU NEGATIVE 04/21/2017 0124   HGBUR SMALL (A) 04/21/2017 0124   BILIRUBINUR NEGATIVE 04/21/2017 0124   KETONESUR NEGATIVE 04/21/2017 0124   PROTEINUR 30 (A) 04/21/2017 0124   UROBILINOGEN 0.2 05/04/2012 0728   NITRITE NEGATIVE 04/21/2017 0124   LEUKOCYTESUR NEGATIVE 04/21/2017 0124   Sepsis Labs Invalid input(s): PROCALCITONIN,  WBC,  LACTICIDVEN Microbiology Recent Results (from the past 240 hour(s))  MRSA PCR Screening     Status: None   Collection Time: 04/15/17  6:37 PM  Result Value Ref Range Status   MRSA by PCR NEGATIVE NEGATIVE Final    Comment:        The GeneXpert MRSA Assay (FDA approved for NASAL specimens only), is one component of a comprehensive MRSA colonization surveillance program. It is not intended to diagnose  MRSA infection nor to guide or monitor treatment for MRSA infections. Performed at [facility], [address], [place], [place] [address]      Time coordinating discharge: 35 min  SIGNED:   Alwyn Ren, MD  [facility] 04/24/2017, 1:51 PM Pager   If 7PM-7AM, please contact night-coverage [email or web address] Password [email or web address]

## 2017-05-10 ENCOUNTER — Observation Stay (HOSPITAL_COMMUNITY)
Admission: EM | Admit: 2017-05-10 | Discharge: 2017-05-13 | DRG: 641 | Disposition: A | Discharge status: Home / Self Care | Payer: BLUE CROSS/BLUE SHIELD | Attending: Family Medicine | Admitting: Family Medicine

## 2017-05-10 ENCOUNTER — Telehealth: Payer: Self-pay | Admitting: Internal Medicine

## 2017-05-10 ENCOUNTER — Ambulatory Visit: Payer: BLUE CROSS/BLUE SHIELD | Admitting: Internal Medicine

## 2017-05-10 ENCOUNTER — Encounter (HOSPITAL_COMMUNITY): Payer: Self-pay

## 2017-05-10 VITALS — BP 128/64 | HR 61 | Ht 64.0 in | Wt 184.0 lb

## 2017-05-10 DIAGNOSIS — Z8679 Personal history of other diseases of the circulatory system: Secondary | ICD-10-CM

## 2017-05-10 DIAGNOSIS — E875 Hyperkalemia: Principal | ICD-10-CM | POA: Diagnosis present

## 2017-05-10 DIAGNOSIS — E118 Type 2 diabetes mellitus with unspecified complications: Secondary | ICD-10-CM

## 2017-05-10 DIAGNOSIS — Z833 Family history of diabetes mellitus: Secondary | ICD-10-CM | POA: Diagnosis not present

## 2017-05-10 DIAGNOSIS — T501X5A Adverse effect of loop [high-ceiling] diuretics, initial encounter: Secondary | ICD-10-CM | POA: Diagnosis not present

## 2017-05-10 DIAGNOSIS — N189 Chronic kidney disease, unspecified: Secondary | ICD-10-CM

## 2017-05-10 DIAGNOSIS — Z7951 Long term (current) use of inhaled steroids: Secondary | ICD-10-CM

## 2017-05-10 DIAGNOSIS — Z8249 Family history of ischemic heart disease and other diseases of the circulatory system: Secondary | ICD-10-CM

## 2017-05-10 DIAGNOSIS — N179 Acute kidney failure, unspecified: Secondary | ICD-10-CM | POA: Diagnosis present

## 2017-05-10 DIAGNOSIS — E1142 Type 2 diabetes mellitus with diabetic polyneuropathy: Secondary | ICD-10-CM

## 2017-05-10 DIAGNOSIS — E0590 Thyrotoxicosis, unspecified without thyrotoxic crisis or storm: Secondary | ICD-10-CM

## 2017-05-10 DIAGNOSIS — D509 Iron deficiency anemia, unspecified: Secondary | ICD-10-CM | POA: Diagnosis not present

## 2017-05-10 DIAGNOSIS — E1165 Type 2 diabetes mellitus with hyperglycemia: Secondary | ICD-10-CM

## 2017-05-10 DIAGNOSIS — E1122 Type 2 diabetes mellitus with diabetic chronic kidney disease: Secondary | ICD-10-CM | POA: Diagnosis present

## 2017-05-10 DIAGNOSIS — Z8349 Family history of other endocrine, nutritional and metabolic diseases: Secondary | ICD-10-CM

## 2017-05-10 DIAGNOSIS — I130 Hypertensive heart and chronic kidney disease with heart failure and stage 1 through stage 4 chronic kidney disease, or unspecified chronic kidney disease: Secondary | ICD-10-CM | POA: Diagnosis not present

## 2017-05-10 DIAGNOSIS — D631 Anemia in chronic kidney disease: Secondary | ICD-10-CM | POA: Diagnosis not present

## 2017-05-10 DIAGNOSIS — D72829 Elevated white blood cell count, unspecified: Secondary | ICD-10-CM | POA: Diagnosis present

## 2017-05-10 DIAGNOSIS — Z6832 Body mass index (BMI) 32.0-32.9, adult: Secondary | ICD-10-CM | POA: Diagnosis not present

## 2017-05-10 DIAGNOSIS — E785 Hyperlipidemia, unspecified: Secondary | ICD-10-CM | POA: Diagnosis present

## 2017-05-10 DIAGNOSIS — Y92009 Unspecified place in unspecified non-institutional (private) residence as the place of occurrence of the external cause: Secondary | ICD-10-CM | POA: Diagnosis not present

## 2017-05-10 DIAGNOSIS — N281 Cyst of kidney, acquired: Secondary | ICD-10-CM | POA: Diagnosis present

## 2017-05-10 DIAGNOSIS — I10 Essential (primary) hypertension: Secondary | ICD-10-CM | POA: Diagnosis present

## 2017-05-10 DIAGNOSIS — I5032 Chronic diastolic (congestive) heart failure: Secondary | ICD-10-CM | POA: Diagnosis not present

## 2017-05-10 DIAGNOSIS — I4891 Unspecified atrial fibrillation: Secondary | ICD-10-CM | POA: Diagnosis present

## 2017-05-10 DIAGNOSIS — E872 Acidosis: Secondary | ICD-10-CM | POA: Diagnosis not present

## 2017-05-10 DIAGNOSIS — Z794 Long term (current) use of insulin: Secondary | ICD-10-CM

## 2017-05-10 DIAGNOSIS — T503X5A Adverse effect of electrolytic, caloric and water-balance agents, initial encounter: Secondary | ICD-10-CM | POA: Diagnosis not present

## 2017-05-10 DIAGNOSIS — T464X5A Adverse effect of angiotensin-converting-enzyme inhibitors, initial encounter: Secondary | ICD-10-CM | POA: Diagnosis not present

## 2017-05-10 DIAGNOSIS — N183 Chronic kidney disease, stage 3 (moderate): Secondary | ICD-10-CM | POA: Diagnosis not present

## 2017-05-10 DIAGNOSIS — R509 Fever, unspecified: Secondary | ICD-10-CM

## 2017-05-10 DIAGNOSIS — Z7901 Long term (current) use of anticoagulants: Secondary | ICD-10-CM

## 2017-05-10 DIAGNOSIS — E0500 Thyrotoxicosis with diffuse goiter without thyrotoxic crisis or storm: Secondary | ICD-10-CM | POA: Diagnosis not present

## 2017-05-10 DIAGNOSIS — E039 Hypothyroidism, unspecified: Secondary | ICD-10-CM | POA: Diagnosis not present

## 2017-05-10 DIAGNOSIS — I471 Supraventricular tachycardia: Secondary | ICD-10-CM | POA: Diagnosis present

## 2017-05-10 DIAGNOSIS — Z7982 Long term (current) use of aspirin: Secondary | ICD-10-CM

## 2017-05-10 DIAGNOSIS — IMO0002 Reserved for concepts with insufficient information to code with codable children: Secondary | ICD-10-CM

## 2017-05-10 DIAGNOSIS — I509 Heart failure, unspecified: Secondary | ICD-10-CM

## 2017-05-10 HISTORY — DX: Thyrotoxicosis with diffuse goiter without thyrotoxic crisis or storm: E05.00

## 2017-05-10 HISTORY — DX: Thyrotoxicosis, unspecified without thyrotoxic crisis or storm: E05.90

## 2017-05-10 LAB — BASIC METABOLIC PANEL WITH GFR
BUN / CREAT RATIO: 28 (calc) — AB (ref 6–22)
BUN: 51 mg/dL — AB (ref 7–25)
CALCIUM: 10 mg/dL (ref 8.6–10.4)
CO2: 25 mmol/L (ref 20–32)
Chloride: 102 mmol/L (ref 98–110)
Creat: 1.81 mg/dL — ABNORMAL HIGH (ref 0.50–0.99)
GFR, EST AFRICAN AMERICAN: 32 mL/min/{1.73_m2} — AB (ref >=60)
GFR, EST NON AFRICAN AMERICAN: 28 mL/min/{1.73_m2} — AB (ref >=60)
GLUCOSE: 236 mg/dL — AB (ref 65–99)
POTASSIUM: 7 mmol/L — AB (ref 3.5–5.3)
Sodium: 133 mmol/L — ABNORMAL LOW (ref 135–146)

## 2017-05-10 LAB — CBC
HEMATOCRIT: 35.9 % — AB (ref 36.0–46.0)
HEMOGLOBIN: 11.1 g/dL — AB (ref 12.0–15.0)
MCH: 23.7 pg — AB (ref 26.0–34.0)
MCHC: 30.9 g/dL (ref 30.0–36.0)
MCV: 76.5 fL — ABNORMAL LOW (ref 78.0–100.0)
Platelets: 279 10*3/uL (ref 150–400)
RBC: 4.69 MIL/uL (ref 3.87–5.11)
RDW: 15.4 % (ref 11.5–15.5)
WBC: 11.7 10*3/uL — ABNORMAL HIGH (ref 4.0–10.5)

## 2017-05-10 LAB — T4, FREE: Free T4: 1.15 ng/dL (ref 0.60–1.60)

## 2017-05-10 LAB — T3, FREE: T3 FREE: 4.2 pg/mL (ref 2.3–4.2)

## 2017-05-10 MED ORDER — METHIMAZOLE 5 MG PO TABS: 10.0000 mg | ORAL_TABLET | Freq: Two times a day (BID) | ORAL | 5 refills | Status: DC

## 2017-05-10 NOTE — Patient Instructions (Signed)
Please continue: - Levemir 25 units at bedtime - Glipizide 5 mg 2x a day before meals  Start checking sugars 1x a day.  Continue: - Methimazole 10 mg 2x a day - Atenolol 50 mg 2x a day  Please stop at the lab.  Please come back for a follow-up appointment in 3 months.   Hyperthyroidism Hyperthyroidism is when the thyroid is too active (overactive). Your thyroid is a large gland that is located in your neck. The thyroid helps to control how your body uses food (metabolism). When your thyroid is overactive, it produces too much of a hormone called thyroxine. What are the causes? Causes of hyperthyroidism may include:  Graves disease. This is when your immune system attacks the thyroid gland. This is the most common cause.  Inflammation of the thyroid gland.  Tumor in the thyroid gland or somewhere else.  Excessive use of thyroid medicines, including: ? Prescription thyroid supplement. ? Herbal supplements that mimic thyroid hormones.  Solid or fluid-filled lumps within your thyroid gland (thyroid nodules).  Excessive ingestion of iodine.  What increases the risk?  Being female.  Having a family history of thyroid conditions. What are the signs or symptoms? Signs and symptoms of hyperthyroidism may include:  Nervousness.  Inability to tolerate heat.  Unexplained weight loss.  Diarrhea.  Change in the texture of hair or skin.  Heart skipping beats or making extra beats.  Rapid heart rate.  Loss of menstruation.  Shaky hands.  Fatigue.  Restlessness.  Increased appetite.  Sleep problems.  Enlarged thyroid gland or nodules.  How is this diagnosed? Diagnosis of hyperthyroidism may include:  Medical history and physical exam.  Blood tests.  Ultrasound tests.  How is this treated? Treatment may include:  Medicines to control your thyroid.  Surgery to remove your thyroid.  Radiation therapy.  Follow these instructions at home:  Take  medicines only as directed by your health care provider.  Do not use any tobacco products, including cigarettes, chewing tobacco, or electronic cigarettes. If you need help quitting, ask your health care provider.  Do not exercise or do physical activity until your health care provider approves.  Keep all follow-up appointments as directed by your health care provider. This is important. Contact a health care provider if:  Your symptoms do not get better with treatment.  You have fever.  You are taking thyroid replacement medicine and you: ? Have depression. ? Feel mentally and physically slow. ? Have weight gain. Get help right away if:  You have decreased alertness or a change in your awareness.  You have abdominal pain.  You feel dizzy.  You have a rapid heartbeat.  You have an irregular heartbeat. This information is not intended to replace advice given to you by your health care provider. Make sure you discuss any questions you have with your health care provider. Document Released: 12/19/2004 Document Revised: 05/20/2015 Document Reviewed: 05/06/2013 Elsevier Interactive Patient Education  2018 [employer].

## 2017-05-10 NOTE — ED Triage Notes (Signed)
Pt sent by doctor, went to see cards today and had lab work done, told to come here for K above 7 and AKI. Denies CP

## 2017-05-10 NOTE — Telephone Encounter (Signed)
Received call with critical lab value - potassium 7.  (BUN/Creatinine 50/1.8).  Reviewed chart.  Pt recently admitted for new onset afib and pulmonary edema.  On lasix and potassium.  Pt informed of lab values and need for immediate treatment.  Pt to ER now.  ER notified.

## 2017-05-10 NOTE — Progress Notes (Addendum)
Patient ID: Donna Booker, female   DOB: 12-19-1945, 72 y.o.   MRN: [id number]  HPI: Donna Booker is a 72 y.o.-year-old female, returning for f/u for DM2, dx in 1987, insulin-dependent since 2006, uncontrolled, with complications (PN, stage 2 CKD)and new dx of Graves ds.. Last visit 72 years and 3 mo ago (for DM only)!  She was recently diagnosed with Graves ds during her recent admission for A. fib with RVR: Lab Results  Component Value Date   TSH <0.010 (L) 04/16/2017   Lab Results  Component Value Date   FREET4 5.15 (H) 04/16/2017   Lab Results  Component Value Date   T3FREE 18.5 (H) 04/16/2017   Lab Results  Component Value Date   TSI 8.02 (H) 04/16/2017   She was started on methimazole 10 mg 2x a day and atenolol 50 mg 3 times a day >> now 2x a day.  She c/o: - weight loss  But: - no heat intolerance - no tremors - no palpitations  No FH of thyroid disease or cancer. No h/o radiation tx to head or neck.  No seaweed or kelp. No recent contrast studies. No herbal supplements. No Biotin use. No recent steroids use.   DM2:  Last hemoglobin A1c was: Lab Results  Component Value Date   HGBA1C 8.0 02/01/2016   HGBA1C 8.3 (H) 10/20/2015   HGBA1C 8.4 07/29/2015   Pt wason a regimen of: - Metformin 1000 mg in am - VGo 30 - initially with 1-2 meal clicks, now off these - started 2015 She was on Lantus 30, but taken off to start the VGo. She was on Victoza. She tried Bydureon (02/2013-10/2014) >> made her hungry, did not lower sugars. She also tried Trulicity 1.5 mg (10/2013-04/2014) >> did not lower her sugars.  She is now: - Levemir 25 units after dinner   - Glipizide 5 mg 2x a day before b'fast and dinner.   Patient does not check sugars. - am: 99-138, 161 >> 94-128 >> ? - 2h after b'fast: n/c - before lunch: 221 (cantaloupe) >> n/c - 2h after lunch: n/c >> 200 >> n/c - before dinner: 119, 130-151 >> 120-125 >> ? - 2h after dinner: n/c >> 189 >> n/c -  bedtime: 192, 265 >> 130-137 >> ? - nighttime: n/c Lowest sugar was 94 >> ?; she has hypoglycemia awareness in the 70s.  Highest sugar was 298 >> ?  Glucometer: AccuChek  -Stage II CKD, last BUN/creatinine:  Lab Results  Component Value Date   BUN 30 (H) 04/24/2017   CREATININE 1.11 (H) 04/24/2017  On olmesartan. -+ HL; last set of lipids: Lab Results  Component Value Date   CHOL 248 (H) 10/20/2015   HDL 38.70 (L) 10/20/2015   LDLCALC 55 09/01/2014   LDLDIRECT 161.0 10/20/2015   TRIG 222.0 (H) 10/20/2015   CHOLHDL 6 10/20/2015  On pravastatin. - last eye exam was in 04/2017.  No DR -No numbness and tingling in her feet.   ROS: Constitutional: + weight loss, + fatigue, no subjective hyperthermia, no subjective hypothermia Eyes: no blurry vision, no xerophthalmia ENT: no sore throat, no nodules palpated in throat, no dysphagia, no odynophagia, no hoarseness Cardiovascular: no CP/+ SOB/no palpitations/no leg swelling (resolved) Respiratory: no cough/+ SOB/no wheezing Gastrointestinal: no N/no V/+ D/no C/no acid reflux Musculoskeletal: no muscle aches/no joint aches Skin: no rashes, no hair loss Neurological: no tremors/no numbness/no tingling/no dizziness  I reviewed pt's medications, allergies, PMH, social hx, family hx, and  changes were documented in the history of present illness. Otherwise, unchanged from my initial visit note.  Past Medical History:  Diagnosis Date  . Diabetes mellitus   . Hypertension    Past Surgical History:  Procedure Laterality Date  . BREAST SURGERY     Social History   Social History  . Marital Status: Married    Spouse Name: N/A  . Number of Children: 1   Occupational History  . 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 Smoking status: Never Smoker   . Smokeless tobacco: Not on file  . Alcohol Use: No  . Drug Use: No   Current Outpatient Medications on File Prior to Visit  Medication Sig Dispense Refill  . apixaban  (ELIQUIS) 5 MG TABS tablet Take 1 tablet (5 mg total) by mouth 2 (two) times daily. 60 tablet 0  . aspirin EC 81 MG tablet Take 81 mg by mouth daily.    Marland Kitchen atenolol (TENORMIN) 50 MG tablet Take 1 tablet (50 mg total) by mouth 3 (three) times daily. 90 tablet 1  . diclofenac sodium (VOLTAREN) 1 % GEL Apply 2 g topically 4 (four) times daily as needed (pain).    Marland Kitchen diltiazem (CARDIZEM SR) 120 MG 12 hr capsule Take 1 capsule (120 mg total) by mouth every 12 (twelve) hours. 90 capsule 4  . esomeprazole (NEXIUM) 40 MG capsule Take 40 mg by mouth daily before breakfast.    . fluticasone furoate-vilanterol (BREO ELLIPTA) 200-25 MCG/INH AEPB Inhale 1 puff into the lungs daily. 1 each 0  . furosemide (LASIX) 40 MG tablet Take 1 tablet (40 mg total) by mouth 2 (two) times daily. 60 tablet 0  . gabapentin (NEURONTIN) 300 MG capsule Take 300 mg by mouth 3 (three) times daily.    Marland Kitchen glipiZIDE (GLUCOTROL) 5 MG tablet Take 1 tablet (5 mg total) by mouth 2 (two) times daily before a meal. NEED ANNUAL VISIT FOR REFILLS (Patient taking differently: Take 5 mg by mouth daily before breakfast. NEED ANNUAL VISIT FOR REFILLS) 60 tablet 0  . insulin detemir (LEVEMIR) 100 UNIT/ML injection Inject 0.25 mLs (25 Units total) into the skin daily at 10 pm. 10 mL 1  . lisinopril (PRINIVIL,ZESTRIL) 5 MG tablet Take 1 tablet (5 mg total) by mouth daily. 30 tablet 1  . loratadine (CLARITIN) 10 MG tablet Take 1 tablet (10 mg total) by mouth at bedtime.    . methimazole (TAPAZOLE) 10 MG tablet Take 2 tablets (20 mg total) by mouth daily. 30 tablet 0  . potassium chloride SA (K-DUR,KLOR-CON) 20 MEQ tablet Take 1 tablet (20 mEq total) by mouth 2 (two) times daily. 60 tablet 1  . pravastatin (PRAVACHOL) 40 MG tablet Take 1 tablet (40 mg total) by mouth daily at 6 PM. 30 tablet 1   No current facility-administered medications on file prior to visit.    Allergies  Allergen Reactions  . Tramadol Nausea And Vomiting    FH - see  HPI  PE: BP 128/64   Pulse 61   Ht 5\' 4"  (1.626 m)   Wt 184 lb (83.5 kg)   SpO2 98%   BMI 31.58 kg/m  Wt Readings from Last 3 Encounters:  05/10/17 184 lb (83.5 kg)  04/24/17 201 lb 8 oz (91.4 kg)  04/20/17 215 lb 11.2 oz (97.8 kg)   Constitutional: overweight, in NAD Eyes: PERRLA, EOMI, no exophthalmos ENT: moist mucous membranes, no thyromegaly, no cervical lymphadenopathy Cardiovascular: RRR, No MRG Respiratory: CTA  B Gastrointestinal: abdomen soft, NT, ND, BS+ Musculoskeletal: no deformities, strength intact in all 4 Skin: moist, warm, no rashes Neurological: no tremor with outstretched hands, DTR normal in all 4  ASSESSMENT: 1. DM2, insulin-dependent, uncontrolled, with complications - PN - CKD stage 2  2.  Hyperthyroidism  PLAN:  1. Patient with long-standing, uncontrolled, diabetes, on basal insulin + glipizide only. Metformin was stopped since last visit and she is also off Victoza. She is unfortunately not checking sugars >> it is impossible to adjust her regimen today >> discussed the need to start checking - ReliOn meter from [place]. - Most recent HbA1c was from more than a year ago and it was 8% - I suggested to:   Patient Instructions  Please continue: - Levemir 25 units at bedtime - Glipizide 5 mg 2x a day before meals  Start checking sugars 1x a day.  Continue: - Methimazole 10 mg 2x a day - Atenolol 50 mg 2x a day  Please stop at the lab.  Please come back for a follow-up appointment in 3 months.  - today, HbA1c is 7.9% (a little better) - continue checking sugars at different times of the day - check 1-2x a day, rotating checks - advised for yearly eye exams >> she is UTD - Return to clinic in 3 mo with sugar log   2.  Hyperthyroidism - new for me - Patient with a recently found low TSH, recently admitted with A. fib with RVR, then acute pulmonary edema in 04/2017.  Graves' antibodies were elevated on 04/16/2017, confirming Graves'  disease. - she had weight loss, diarrhea - but no tremors, palpitations, anxiety, heat intolerance - we discussed about possible modalities of treatment for the above conditions, to include methimazole use, radioactive iodine ablation or (last resort) surgery. - in the hospital she was started on methimazole 10 mg twice a day and atenolol 50 mg  - now 2 times a day - We will continue these doses for now and recheck her TFTs  - RTC in 3 months, but likely sooner for repeat labs  Component     Latest Ref Rng & Units 05/10/2017  Glucose     65 - 99 mg/dL 170 (H)  BUN     7 - 25 mg/dL 51 (H)  Creatinine     0.50 - 0.99 mg/dL 0.17 (H)  GFR, Est Non African American     > OR = 60 mL/min/1.49m2 28 (L)  GFR, Est African American     > OR = 60 mL/min/1.56m2 32 (L)  BUN/Creatinine Ratio     6 - 22 (calc) 28 (H)  Sodium     135 - 146 mmol/L 133 (L)  Potassium     3.5 - 5.3 mmol/L 7.0 (HH)  Chloride     98 - 110 mmol/L 102  CO2     20 - 32 mmol/L 25  Calcium     8.6 - 10.4 mg/dL 49.4   On call after hours note: [place], MD   05/10/17 10:20 PM  Note    Received call with critical lab value - potassium 7.  (BUN/Creatinine 50/1.8).  Reviewed chart.  Pt recently admitted for new onset afib and pulmonary edema.  On lasix and potassium.  Pt informed of lab values and need for immediate treatment.  Pt to ER now.  ER notified.        Will FWD to pt's cardiologist.  Component     Latest Ref Rng &  Units 05/10/2017        11:46 AM  TSH     0.35 - 4.50 uIU/mL <0.01 Repeated and verified X2. (L)  T4,Free(Direct)     0.60 - 1.60 ng/dL 4.65  Triiodothyronine,Free,Serum     2.3 - 4.2 pg/mL 4.2   TFTs are much better.  Due to the large decrease in her TFTs in such a short time, I would suggest to decrease her methimazole dose to 5 mg twice a day.  We will then need to repeat the tests in 4 to 5 weeks.  Carlus Pavlov, MD PhD [facility]

## 2017-05-11 ENCOUNTER — Encounter (HOSPITAL_COMMUNITY): Payer: Self-pay | Admitting: Internal Medicine

## 2017-05-11 ENCOUNTER — Other Ambulatory Visit: Payer: Self-pay

## 2017-05-11 ENCOUNTER — Encounter: Payer: Self-pay | Admitting: Internal Medicine

## 2017-05-11 ENCOUNTER — Observation Stay (HOSPITAL_COMMUNITY): Payer: BLUE CROSS/BLUE SHIELD

## 2017-05-11 DIAGNOSIS — E875 Hyperkalemia: Principal | ICD-10-CM

## 2017-05-11 DIAGNOSIS — I5032 Chronic diastolic (congestive) heart failure: Secondary | ICD-10-CM | POA: Diagnosis not present

## 2017-05-11 DIAGNOSIS — N179 Acute kidney failure, unspecified: Secondary | ICD-10-CM | POA: Diagnosis not present

## 2017-05-11 DIAGNOSIS — I471 Supraventricular tachycardia: Secondary | ICD-10-CM | POA: Diagnosis not present

## 2017-05-11 DIAGNOSIS — N189 Chronic kidney disease, unspecified: Secondary | ICD-10-CM

## 2017-05-11 LAB — TROPONIN I
Troponin I: <0.03 ng/mL (ref <0.03)
Troponin I: <0.03 ng/mL (ref <0.03)

## 2017-05-11 LAB — BASIC METABOLIC PANEL
ANION GAP: 11 (ref 5–15)
Anion gap: 11 (ref 5–15)
Anion gap: 10 (ref 5–15)
Anion gap: 10 (ref 5–15)
BUN: 52 mg/dL — AB (ref 6–20)
BUN: 47 mg/dL — AB (ref 6–20)
BUN: 44 mg/dL — ABNORMAL HIGH (ref 6–20)
BUN: 38 mg/dL — ABNORMAL HIGH (ref 6–20)
CO2: 20 mmol/L — ABNORMAL LOW (ref 22–32)
CO2: 24 mmol/L (ref 22–32)
CO2: 24 mmol/L (ref 22–32)
CO2: 23 mmol/L (ref 22–32)
CREATININE: 2.03 mg/dL — AB (ref 0.44–1.00)
CREATININE: 1.92 mg/dL — AB (ref 0.44–1.00)
Calcium: 9.6 mg/dL (ref 8.9–10.3)
Calcium: 10.5 mg/dL — ABNORMAL HIGH (ref 8.9–10.3)
Calcium: 10.5 mg/dL — ABNORMAL HIGH (ref 8.9–10.3)
Calcium: 9.6 mg/dL (ref 8.9–10.3)
Chloride: 103 mmol/L (ref 101–111)
Chloride: 108 mmol/L (ref 101–111)
Chloride: 109 mmol/L (ref 101–111)
Chloride: 107 mmol/L (ref 101–111)
Creatinine, Ser: 1.8 mg/dL — ABNORMAL HIGH (ref 0.44–1.00)
Creatinine, Ser: 1.73 mg/dL — ABNORMAL HIGH (ref 0.44–1.00)
GFR calc Af Amer: 28 mL/min — ABNORMAL LOW (ref >60)
GFR calc Af Amer: 30 mL/min — ABNORMAL LOW (ref >60)
GFR calc Af Amer: 32 mL/min — ABNORMAL LOW (ref >60)
GFR calc Af Amer: 34 mL/min — ABNORMAL LOW (ref >60)
GFR calc non Af Amer: 26 mL/min — ABNORMAL LOW (ref >60)
GFR calc non Af Amer: 28 mL/min — ABNORMAL LOW (ref >60)
GFR calc non Af Amer: 29 mL/min — ABNORMAL LOW (ref >60)
GFR, EST NON AFRICAN AMERICAN: 24 mL/min — AB (ref >60)
GLUCOSE: 132 mg/dL — AB (ref 65–99)
GLUCOSE: 94 mg/dL (ref 65–99)
GLUCOSE: 142 mg/dL — AB (ref 65–99)
Glucose, Bld: 207 mg/dL — ABNORMAL HIGH (ref 65–99)
POTASSIUM: 5.6 mmol/L — AB (ref 3.5–5.1)
POTASSIUM: 5.4 mmol/L — AB (ref 3.5–5.1)
Potassium: >7.5 mmol/L (ref 3.5–5.1)
Potassium: 6.6 mmol/L (ref 3.5–5.1)
SODIUM: 134 mmol/L — AB (ref 135–145)
Sodium: 142 mmol/L (ref 135–145)
Sodium: 143 mmol/L (ref 135–145)
Sodium: 141 mmol/L (ref 135–145)

## 2017-05-11 LAB — URINALYSIS, ROUTINE W REFLEX MICROSCOPIC
BILIRUBIN URINE: NEGATIVE
Glucose, UA: 50 mg/dL — AB
Hgb urine dipstick: NEGATIVE
KETONES UR: NEGATIVE mg/dL
LEUKOCYTES UA: NEGATIVE
NITRITE: NEGATIVE
PH: 7 (ref 5.0–8.0)
PROTEIN: NEGATIVE mg/dL
Specific Gravity, Urine: 1.008 (ref 1.005–1.030)

## 2017-05-11 LAB — APTT: aPTT: 52 seconds — ABNORMAL HIGH (ref 24–36)

## 2017-05-11 LAB — GLUCOSE, CAPILLARY
GLUCOSE-CAPILLARY: 138 mg/dL — AB (ref 65–99)
Glucose-Capillary: 142 mg/dL — ABNORMAL HIGH (ref 65–99)

## 2017-05-11 LAB — SODIUM, URINE, RANDOM: SODIUM UR: 65 mmol/L

## 2017-05-11 LAB — NA AND K (SODIUM & POTASSIUM), RAND UR
Potassium Urine: 43 mmol/L
SODIUM UR: 63 mmol/L

## 2017-05-11 LAB — CBG MONITORING, ED
Glucose-Capillary: 114 mg/dL — ABNORMAL HIGH (ref 65–99)
Glucose-Capillary: 86 mg/dL (ref 65–99)

## 2017-05-11 LAB — TSH

## 2017-05-11 LAB — CREATININE, URINE, RANDOM: Creatinine, Urine: 44.05 mg/dL

## 2017-05-11 LAB — HEPARIN LEVEL (UNFRACTIONATED): HEPARIN UNFRACTIONATED: 0.75 [IU]/mL — AB (ref 0.30–0.70)

## 2017-05-11 LAB — MAGNESIUM: MAGNESIUM: 1.4 mg/dL — AB (ref 1.7–2.4)

## 2017-05-11 LAB — CK: Total CK: 35 U/L — ABNORMAL LOW (ref 38–234)

## 2017-05-11 MED ORDER — METHIMAZOLE 5 MG PO TABS
5.0000 mg | ORAL_TABLET | Freq: Two times a day (BID) | ORAL | Status: DC
  Administered 2017-05-11 – 2017-05-13 (×5): 5 mg via ORAL
  Filled 2017-05-11 (×7): qty 1

## 2017-05-11 MED ORDER — SODIUM BICARBONATE 8.4 % IV SOLN
50.0000 meq | Freq: Once | INTRAVENOUS | Status: AC
  Administered 2017-05-11: 50 meq via INTRAVENOUS
  Filled 2017-05-11: qty 50

## 2017-05-11 MED ORDER — ONDANSETRON HCL 4 MG PO TABS: 4.0000 mg | ORAL_TABLET | Freq: Four times a day (QID) | ORAL | Status: DC | PRN

## 2017-05-11 MED ORDER — SODIUM BICARBONATE 8.4 % IV SOLN
Freq: Once | INTRAVENOUS | Status: AC
  Administered 2017-05-11: 13:00:00 via INTRAVENOUS
  Filled 2017-05-11: qty 100

## 2017-05-11 MED ORDER — SODIUM CHLORIDE 0.9 % IV SOLN
1.0000 g | Freq: Once | INTRAVENOUS | Status: AC
  Administered 2017-05-11: 1 g via INTRAVENOUS
  Filled 2017-05-11: qty 10

## 2017-05-11 MED ORDER — DEXTROSE 50 % IV SOLN
1.0000 | Freq: Once | INTRAVENOUS | Status: AC
  Administered 2017-05-11: 50 mL via INTRAVENOUS
  Filled 2017-05-11: qty 50

## 2017-05-11 MED ORDER — ASPIRIN EC 81 MG PO TBEC
81.0000 mg | DELAYED_RELEASE_TABLET | Freq: Every day | ORAL | Status: DC
  Administered 2017-05-11 – 2017-05-13 (×3): 81 mg via ORAL
  Filled 2017-05-11 (×3): qty 1

## 2017-05-11 MED ORDER — HYDRALAZINE HCL 20 MG/ML IJ SOLN: 10.0000 mg | INTRAMUSCULAR | Status: DC | PRN

## 2017-05-11 MED ORDER — SODIUM POLYSTYRENE SULFONATE 15 GM/60ML PO SUSP: 30.0000 g | Freq: Once | ORAL | Status: DC

## 2017-05-11 MED ORDER — PANTOPRAZOLE SODIUM 40 MG PO TBEC
40.0000 mg | DELAYED_RELEASE_TABLET | Freq: Every day | ORAL | Status: DC
  Administered 2017-05-11 – 2017-05-13 (×3): 40 mg via ORAL
  Filled 2017-05-11 (×3): qty 1

## 2017-05-11 MED ORDER — INSULIN ASPART 100 UNIT/ML ~~LOC~~ SOLN: 0.0000 [IU] | Freq: Three times a day (TID) | SUBCUTANEOUS | Status: DC

## 2017-05-11 MED ORDER — ATENOLOL 50 MG PO TABS
50.0000 mg | ORAL_TABLET | Freq: Two times a day (BID) | ORAL | Status: DC
  Administered 2017-05-11: 50 mg via ORAL
  Filled 2017-05-11: qty 1

## 2017-05-11 MED ORDER — FLUTICASONE FUROATE-VILANTEROL 200-25 MCG/INH IN AEPB
1.0000 | INHALATION_SPRAY | Freq: Every day | RESPIRATORY_TRACT | Status: DC
  Administered 2017-05-11 – 2017-05-13 (×2): 1 via RESPIRATORY_TRACT
  Filled 2017-05-11 (×2): qty 28

## 2017-05-11 MED ORDER — SODIUM CHLORIDE 0.9 % IV SOLN
INTRAVENOUS | Status: AC
  Administered 2017-05-11 – 2017-05-12 (×2): via INTRAVENOUS

## 2017-05-11 MED ORDER — ENSURE ENLIVE PO LIQD
237.0000 mL | Freq: Two times a day (BID) | ORAL | Status: DC
  Administered 2017-05-12: 237 mL via ORAL

## 2017-05-11 MED ORDER — MORPHINE SULFATE (PF) 2 MG/ML IV SOLN
INTRAVENOUS | Status: AC
  Administered 2017-05-11: 2 mg via INTRAVENOUS
  Filled 2017-05-11: qty 1

## 2017-05-11 MED ORDER — INSULIN ASPART 100 UNIT/ML ~~LOC~~ SOLN
0.0000 [IU] | Freq: Three times a day (TID) | SUBCUTANEOUS | Status: DC
  Administered 2017-05-11: 1 [IU] via SUBCUTANEOUS
  Administered 2017-05-12 (×2): 3 [IU] via SUBCUTANEOUS

## 2017-05-11 MED ORDER — ACETAMINOPHEN 650 MG RE SUPP: 650.0000 mg | Freq: Four times a day (QID) | RECTAL | Status: DC | PRN

## 2017-05-11 MED ORDER — INSULIN ASPART 100 UNIT/ML ~~LOC~~ SOLN
10.0000 [IU] | Freq: Once | SUBCUTANEOUS | Status: AC
  Administered 2017-05-11: 10 [IU] via INTRAVENOUS
  Filled 2017-05-11: qty 1

## 2017-05-11 MED ORDER — SODIUM POLYSTYRENE SULFONATE 15 GM/60ML PO SUSP
30.0000 g | Freq: Once | ORAL | Status: AC
  Administered 2017-05-11: 30 g via ORAL
  Filled 2017-05-11: qty 120

## 2017-05-11 MED ORDER — DILTIAZEM LOAD VIA INFUSION
10.0000 mg | Freq: Once | INTRAVENOUS | Status: AC
  Administered 2017-05-11: 10 mg via INTRAVENOUS
  Filled 2017-05-11: qty 10

## 2017-05-11 MED ORDER — MORPHINE SULFATE (PF) 2 MG/ML IV SOLN: 2.0000 mg | Freq: Once | INTRAVENOUS | Status: DC

## 2017-05-11 MED ORDER — INSULIN ASPART 100 UNIT/ML IV SOLN: 10.0000 [IU] | Freq: Once | INTRAVENOUS | Status: DC

## 2017-05-11 MED ORDER — PRAVASTATIN SODIUM 40 MG PO TABS
40.0000 mg | ORAL_TABLET | Freq: Every day | ORAL | Status: DC
  Administered 2017-05-11 – 2017-05-12 (×2): 40 mg via ORAL
  Filled 2017-05-11 (×2): qty 1

## 2017-05-11 MED ORDER — ACETAMINOPHEN 325 MG PO TABS
650.0000 mg | ORAL_TABLET | Freq: Four times a day (QID) | ORAL | Status: DC | PRN
  Administered 2017-05-11 – 2017-05-12 (×2): 650 mg via ORAL
  Filled 2017-05-11 (×2): qty 2

## 2017-05-11 MED ORDER — HEPARIN (PORCINE) IN NACL 100-0.45 UNIT/ML-% IJ SOLN
1150.0000 [IU]/h | INTRAMUSCULAR | Status: DC
  Administered 2017-05-11: 1000 [IU]/h via INTRAVENOUS
  Filled 2017-05-11 (×2): qty 250

## 2017-05-11 MED ORDER — NITROGLYCERIN 0.4 MG SL SUBL: 0.4000 mg | SUBLINGUAL_TABLET | SUBLINGUAL | Status: DC | PRN

## 2017-05-11 MED ORDER — ONDANSETRON HCL 4 MG/2ML IJ SOLN
4.0000 mg | Freq: Four times a day (QID) | INTRAMUSCULAR | Status: DC | PRN
Start: 2017-05-11 — End: 2017-05-13
  Administered 2017-05-11: 4 mg via INTRAVENOUS
  Filled 2017-05-11: qty 2

## 2017-05-11 MED ORDER — SODIUM CHLORIDE 0.9 % IV BOLUS
250.0000 mL | Freq: Once | INTRAVENOUS | Status: AC
  Administered 2017-05-11: 250 mL via INTRAVENOUS

## 2017-05-11 MED ORDER — ALBUTEROL SULFATE (2.5 MG/3ML) 0.083% IN NEBU
10.0000 mg | INHALATION_SOLUTION | Freq: Once | RESPIRATORY_TRACT | Status: AC
  Administered 2017-05-11: 10 mg via RESPIRATORY_TRACT
  Filled 2017-05-11: qty 12

## 2017-05-11 MED ORDER — INSULIN ASPART 100 UNIT/ML ~~LOC~~ SOLN
0.0000 [IU] | Freq: Every day | SUBCUTANEOUS | Status: DC
  Administered 2017-05-12: 3 [IU] via SUBCUTANEOUS

## 2017-05-11 MED ORDER — DILTIAZEM HCL 100 MG IV SOLR
5.0000 mg/h | INTRAVENOUS | Status: DC
  Administered 2017-05-11: 5 mg/h via INTRAVENOUS
  Administered 2017-05-12: 10 mg/h via INTRAVENOUS
  Filled 2017-05-11 (×2): qty 100

## 2017-05-11 MED ORDER — INSULIN DETEMIR 100 UNIT/ML ~~LOC~~ SOLN
10.0000 [IU] | Freq: Every day | SUBCUTANEOUS | Status: DC
  Administered 2017-05-11 – 2017-05-12 (×2): 10 [IU] via SUBCUTANEOUS
  Filled 2017-05-11 (×3): qty 0.1

## 2017-05-11 MED ORDER — CALCIUM GLUCONATE 10 % IV SOLN: 1.0000 g | Freq: Once | INTRAVENOUS | Status: DC

## 2017-05-11 MED ORDER — METOPROLOL TARTRATE 5 MG/5ML IV SOLN
2.5000 mg | Freq: Once | INTRAVENOUS | Status: AC
Start: 2017-05-11 — End: 2017-05-11
  Administered 2017-05-11: 2.5 mg via INTRAVENOUS
  Filled 2017-05-11: qty 5

## 2017-05-11 MED ORDER — METOPROLOL TARTRATE 12.5 MG HALF TABLET
12.5000 mg | ORAL_TABLET | Freq: Four times a day (QID) | ORAL | Status: DC
  Administered 2017-05-11: 12.5 mg via ORAL
  Filled 2017-05-11: qty 1

## 2017-05-11 NOTE — ED Notes (Signed)
Pt ambulatory to restroom with minimal assist from this NT. Pt resting in bed at this time.

## 2017-05-11 NOTE — Progress Notes (Signed)
Donna Booker [id number] Admission Data: 05/11/2017 8:32 PM Attending Provider: Eduard Clos, MD  WIO:[id number], Austin Miles, MD Consults/ Treatment Team:   Donna Booker is a 72 y.o. female patient admitted from ED awake, alert  & orientated  X 3,  Full Code, VSS - Blood pressure (!) 161/67, pulse 87, temperature (!) 101 F (38.3 C), temperature source Oral, resp. rate 19, height 5\' 4"  (1.626 m), weight 84.3 kg (185 lb 12.8 oz), SpO2 100 %.,  no c/o shortness of breath, no c/o chest pain, no distress noted. Tele # [id number] placed and pt is currently running:A fib., SVT.   IV site WDL:  forearm right, condition patent and no redness with a transparent dsg that's clean dry and intact.  Allergies:   Allergies  Allergen Reactions  . Tramadol Nausea And Vomiting     Past Medical History:  Diagnosis Date  . Diabetes mellitus   . Hypertension     History:  obtained from chart review. Tobacco/alcohol: unknown tobacco use none  Pt orientation to unit, room and routine. Information packet given to patient/family and safety video watched.  Admission INP armband ID verified with patient/family, and in place. SR up x 2, fall risk assessment complete with Patient and family verbalizing understanding of risks associated with falls. Pt verbalizes an understanding of how to use the call bell and to call for help before getting out of bed.  Skin, clean-dry- intact without evidence of bruising, or skin tears.   No evidence of skin break down noted on exam. no rashes, no ecchymoses    Will cont to monitor and assist as needed.  , RN 05/11/2017 8:32 PM

## 2017-05-11 NOTE — ED Notes (Signed)
ED Provider at bedside. 

## 2017-05-11 NOTE — ED Notes (Signed)
Patient transported to Ultrasound 

## 2017-05-11 NOTE — Progress Notes (Signed)
ANTICOAGULATION CONSULT NOTE - Initial Consult  Pharmacy Consult for Heparin (holding apixaban) Indication: atrial fibrillation  Allergies  Allergen Reactions  . Tramadol Nausea And Vomiting   Vital Signs: Temp: 97.8 F (36.6 C) (05/09 2257) Temp Source: Oral (05/09 2257) BP: 107/54 (05/10 0400) Pulse Rate: 67 (05/10 0400)  Labs: Recent Labs    05/10/17 1146 05/10/17 2314  HGB  --  11.1*  HCT  --  35.9*  PLT  --  279  CREATININE 1.81* 2.03*  TROPONINI  --  <0.03    Estimated Creatinine Clearance: 27.3 mL/min (A) (by C-G formula based on SCr of 2.03 mg/dL (H)).   Medical History: Past Medical History:  Diagnosis Date  . Diabetes mellitus   . Hypertension     Assessment: 72 y/o F presents to the ED with hyperkalemia, recent new onset afib on apixaban PTA, last dose <12 hours ago, will start heparin 12 hours after last dose, Hgb 11.1, noted renal dysfunction. Will likely need to use aPTT for the next few days to dose given apixaban effect on heparin levels.   Goal of Therapy:  Heparin level 0.3-0.7 units/ml aPTT 66-102 seconds Monitor platelets by anticoagulation protocol: Yes   Plan:  -Start heparin drip at 1000 units/hr at 1000 today -1800 aPTT/HL -Daily CBC/aPTT/HL -Monitor for bleeding  Abran Duke 05/11/2017,4:16 AM

## 2017-05-11 NOTE — Progress Notes (Signed)
ANTICOAGULATION CONSULT NOTE - FOLLOW UP   APTT = 52 (goal 66-102 sec) HL = 0.75 (goal 0.3 - 0.7 units/mL) Heparin dosing weight = 63 kg   Assessment: [name] with Afib to continue on IV heparin while Eliquis is on hold.  Currently using aPTT to guide heparin dosing because Eliquis falsely elevates heparin levels.  APTT is sub-therapeutic.  No bleeding nor complication with infusion per RN.   Plan: Increase heparin gtt to 1150 units/hr F/U AM labs   Donna Booker, PharmD, BCPS 05/11/2017, 8:55 PM

## 2017-05-11 NOTE — Addendum Note (Signed)
Addended by: Carlus Pavlov on: 05/11/2017 05:19 PM   Modules accepted: Orders

## 2017-05-11 NOTE — ED Notes (Signed)
Pt returned from US

## 2017-05-11 NOTE — H&P (Signed)
History and Physical    Donna Booker NGE:[id number] DOB: June 04, 1945 DOA: 05/10/2017  PCP: Myrlene Broker, MD  Patient coming from: Home.  Chief Complaint: Elevated potassium.  HPI: Donna Booker is a 72 y.o. female with history of Graves' disease diabetes mellitus hypertension who was recently admitted for new onset atrial fibrillation and CHF had a routine visit with her endocrinologist and had a routine labs done which showed hyperkalemia with acute renal failure.  Patient was instructed to come to the ER.  Patient otherwise was asymptomatic denies any nausea vomiting diarrhea chest pain or shortness of breath fever or chills.  Patient was recently admitted for CHF and A. fib and was started on Lasix and lisinopril.  Denies taking NSAIDs.  ED Course: In the ER labs were repeated and shows potassium more than 7.5 EKG shows tall T waves.  Creatinine was 2.  During discharged 2 weeks ago was around 1.1.  Patient was given Kayexalate 30 g calcium gluconate D50 bicarbonate IV insulin for hyperkalemia.  Patient admitted for further observation for severe hyperkalemia with acute renal failure.  Review of Systems: As per HPI, rest all negative.   Past Medical History:  Diagnosis Date  . Diabetes mellitus   . Hypertension     Past Surgical History:  Procedure Laterality Date  . BREAST SURGERY       reports that she has never smoked. She has never used smokeless tobacco. She reports that she does not drink alcohol or use drugs.  Allergies  Allergen Reactions  . Tramadol Nausea And Vomiting    Family History  Problem Relation Age of Onset  . Diabetes Mellitus II Mother   . Diabetes Mellitus II Father   . Hypertension Father   . Hyperlipidemia Father     Prior to Admission medications   Medication Sig Start Date End Date Taking? Authorizing Provider  apixaban (ELIQUIS) 5 MG TABS tablet Take 1 tablet (5 mg total) by mouth 2 (two) times daily. 04/20/17  Yes Calvert Cantor,  MD  aspirin EC 81 MG tablet Take 81 mg by mouth daily.   Yes [provider]  atenolol (TENORMIN) 50 MG tablet Take 1 tablet (50 mg total) by mouth 3 (three) times daily. Patient taking differently: Take 50 mg by mouth 2 (two) times daily.  04/24/17  Yes Alwyn Ren, MD  diclofenac sodium (VOLTAREN) 1 % GEL Apply 2 g topically 4 (four) times daily as needed (pain). 04/24/17  Yes Alwyn Ren, MD  esomeprazole (NEXIUM) 40 MG capsule Take 40 mg by mouth daily before breakfast.   Yes [provider]  fluticasone furoate-vilanterol (BREO ELLIPTA) 200-25 MCG/INH AEPB Inhale 1 puff into the lungs daily. 04/21/17  Yes Calvert Cantor, MD  furosemide (LASIX) 40 MG tablet Take 1 tablet (40 mg total) by mouth 2 (two) times daily. 04/24/17  Yes Alwyn Ren, MD  gabapentin (NEURONTIN) 300 MG capsule Take 300 mg by mouth 3 (three) times daily. 02/22/17  Yes [provider]  glipiZIDE (GLUCOTROL) 5 MG tablet Take 1 tablet (5 mg total) by mouth 2 (two) times daily before a meal. NEED ANNUAL VISIT FOR REFILLS 12/13/16  Yes Myrlene Broker, MD  insulin detemir (LEVEMIR) 100 UNIT/ML injection Inject 0.25 mLs (25 Units total) into the skin daily at 10 pm. 04/24/17  Yes Alwyn Ren, MD  lisinopril (PRINIVIL,ZESTRIL) 5 MG tablet Take 1 tablet (5 mg total) by mouth daily. Patient taking differently: Take 5 mg by  mouth 2 (two) times daily.  04/25/17  Yes Alwyn Ren, MD  loratadine (CLARITIN) 10 MG tablet Take 1 tablet (10 mg total) by mouth at bedtime. 04/24/17  Yes Alwyn Ren, MD  methimazole (TAPAZOLE) 5 MG tablet Take 2 tablets (10 mg total) by mouth 2 (two) times daily. Patient taking differently: Take 5 mg by mouth 2 (two) times daily.  05/10/17  Yes Carlus Pavlov, MD  potassium chloride SA (K-DUR,KLOR-CON) 20 MEQ tablet Take 1 tablet (20 mEq total) by mouth 2 (two) times daily. 04/24/17  Yes Alwyn Ren, MD  pravastatin  (PRAVACHOL) 40 MG tablet Take 1 tablet (40 mg total) by mouth daily at 6 PM. 04/24/17  Yes Alwyn Ren, MD    Physical Exam: Vitals:   05/11/17 0230 05/11/17 0245 05/11/17 0300 05/11/17 0315  BP: (!) 138/48 (!) 124/49 (!) 149/55 (!) 138/48  Pulse: 64 65 71 75  Resp: 16 16 19 14   Temp:      TempSrc:      SpO2: 100% 100% 98% 99%      Constitutional: Moderately built and nourished. Vitals:   05/11/17 0230 05/11/17 0245 05/11/17 0300 05/11/17 0315  BP: (!) 138/48 (!) 124/49 (!) 149/55 (!) 138/48  Pulse: 64 65 71 75  Resp: 16 16 19 14   Temp:      TempSrc:      SpO2: 100% 100% 98% 99%   Eyes: Anicteric no pallor. ENMT: No discharge from the ears eyes nose or mouth. Neck: No mass felt.  No neck rigidity.  No JVD appreciated. Respiratory: No rhonchi or crepitations. Cardiovascular: S1-S2 heard no murmurs appreciated. Abdomen: Soft nontender bowel sounds present. Musculoskeletal: No edema.  No joint effusion. Skin: No rash.  Skin appears warm. Neurologic: Alert awake oriented to time place and person.  Moves all extremities. Psychiatric: Appears normal.  Normal affect.   Labs on Admission: I have personally reviewed following labs and imaging studies  CBC: Recent Labs  Lab 05/10/17 2314  WBC 11.7*  HGB 11.1*  HCT 35.9*  MCV 76.5*  PLT 279   Basic Metabolic Panel: Recent Labs  Lab 05/10/17 1146 05/10/17 2314  NA 133* 134*  K 7.0* >7.5*  CL 102 103  CO2 25 20*  GLUCOSE 236* 207*  BUN 51* 52*  CREATININE 1.81* 2.03*  CALCIUM 10.0 9.6   GFR: Estimated Creatinine Clearance: 27.3 mL/min (A) (by C-G formula based on SCr of 2.03 mg/dL (H)). Liver Function Tests: No results for input(s): AST, ALT, ALKPHOS, BILITOT, PROT, ALBUMIN in the last 168 hours. No results for input(s): LIPASE, AMYLASE in the last 168 hours. No results for input(s): AMMONIA in the last 168 hours. Coagulation Profile: No results for input(s): INR, PROTIME in the last 168  hours. Cardiac Enzymes: Recent Labs  Lab 05/10/17 2314  TROPONINI <0.03   BNP (last 3 results) No results for input(s): PROBNP in the last 8760 hours. HbA1C: No results for input(s): HGBA1C in the last 72 hours. CBG: No results for input(s): GLUCAP in the last 168 hours. Lipid Profile: No results for input(s): CHOL, HDL, LDLCALC, TRIG, CHOLHDL, LDLDIRECT in the last 72 hours. Thyroid Function Tests: No results for input(s): TSH, T4TOTAL, FREET4, T3FREE, THYROIDAB in the last 72 hours. Anemia Panel: No results for input(s): VITAMINB12, FOLATE, FERRITIN, TIBC, IRON, RETICCTPCT in the last 72 hours. Urine analysis:    Component Value Date/Time   COLORURINE STRAW (A) 04/21/2017 0124   APPEARANCEUR CLEAR 04/21/2017 0124   LABSPEC 1.008  04/21/2017 0124   PHURINE 5.0 04/21/2017 0124   GLUCOSEU NEGATIVE 04/21/2017 0124   HGBUR SMALL (A) 04/21/2017 0124   BILIRUBINUR NEGATIVE 04/21/2017 0124   KETONESUR NEGATIVE 04/21/2017 0124   PROTEINUR 30 (A) 04/21/2017 0124   UROBILINOGEN 0.2 05/04/2012 0728   NITRITE NEGATIVE 04/21/2017 0124   LEUKOCYTESUR NEGATIVE 04/21/2017 0124   Sepsis Labs: @LABRCNTIP (procalcitonin:4,lacticidven:4) )No results found for this or any previous visit (from the past 240 hour(s)).   Radiological Exams on Admission: No results found.  EKG: Independently reviewed.  Normal sinus rhythm with tall T waves.  QRS 80 formal seconds QTc 284 ms.  Assessment/Plan Principal Problem:   Hyperkalemia Active Problems:   HTN (hypertension)   Uncontrolled type 2 diabetes mellitus with diabetic polyneuropathy, with long-term current use of insulin (HCC)   Hyperthyroidism   New onset of congestive heart failure (HCC)   History of atrial fibrillation   ARF (acute renal failure) (HCC)    1. Severe hyperkalemia with acute renal failure -I have given 500 cc normal saline bolus.  We will hold Lasix and discontinue lisinopril for now.  Patient was given calcium gluconate  Kayexalate 30 g sodium bicarbonate IV insulin D50 and albuterol nebulizer.  Repeat metabolic panel.  Closely follow metabolic panel intake output. 2. Atrial fibrillation presently in sinus rhythm -we will continue metoprolol.  Will hold Eliquis due to renal failure.  Will keep patient on heparin. 3. Recently diagnosed diastolic CHF appears to be compensated.  Will hold Lasix due to renal failure.  Closely follow respiratory status. 4. Hypothyroidism on Tapazole. 5. Diabetes mellitus type 2 -due to acute renal failure have decrease patient's long-acting insulin dose from 25 units to 10 units.  Closely follow CBGs. 6. Hypertension -this patient's lisinopril is on hold I have placed patient on PRN IV hydralazine.  Continue metoprolol.  Due to renal failure I am holding off patient's gabapentin.  If creatinine does not improve then may have decreased, but did at a lower dose.   DVT prophylaxis: Heparin. Code Status: Full code. Family Communication: Discussed with patient. Disposition Plan: Home. Consults called: None. Admission status: Observation.     MD [facility] Pager [phone].  If 7PM-7AM, please contact night-coverage [email or web address] Password [email or web address]  05/11/2017, 4:00 AM

## 2017-05-11 NOTE — ED Notes (Signed)
Ordered diet tray for pt  

## 2017-05-11 NOTE — ED Notes (Signed)
Admitting provider at bedside for evaluation.

## 2017-05-11 NOTE — Progress Notes (Addendum)
PROGRESS NOTE    Donna Booker  HDQ:[id number] DOB: May 23, 1945 DOA: 05/10/2017 PCP: Donna Broker, MD   Brief Narrative:  Donna Booker is Donna Booker 72 y.o. female with history of Graves' disease diabetes mellitus hypertension who was recently admitted for new onset atrial fibrillation and CHF had Donna Booker routine visit with her endocrinologist and had Donna Booker routine labs done which showed hyperkalemia with acute renal failure.  Patient was instructed to come to the ER.  Patient otherwise was asymptomatic denies any nausea vomiting diarrhea chest pain or shortness of breath fever or chills.  Patient was recently admitted for CHF and Donna Booker. fib and was started on Lasix and lisinopril.  Denies taking NSAIDs.  Assessment & Plan:   Principal Problem:   Hyperkalemia Active Problems:   HTN (hypertension)   Uncontrolled type 2 diabetes mellitus with diabetic polyneuropathy, with long-term current use of insulin (HCC)   Hyperthyroidism   New onset of congestive heart failure (HCC)   History of atrial fibrillation   ARF (acute renal failure) (HCC)   1. Severe hyperkalemia with acute renal failure:   Suspect this is due to combination of lisinopril (started at last discharge), overdiuresis with lasix, as well as potassium supplementation.   1. S/p calcium chloride in ED as well as insulin, D50, bicarb, and albuterol as well as kayexalate x1.  2. Repeated insulin, d50, bicarb this AM with improved but still elevated K with peaked T waves.   3. Will dose kayexalate 30 x 2 additionally today 4. Follow renal US 5. IVF for hydration 6. Continue to hold lasix and lisinopril and supplemental potassium   2. Atrial fibrillation in sinus rhythm at presentation, but per ED, now back in afib.   1. Eliquis being held for renal failure 2. Continue heparin gtt 3. Follow repeat EKG, with intermittent tachycardia now as well, will dose 2.5 mg metop x1 and then change atenolol to metop ordered q6 4. On review of EKG and  tele, pt with sinus tachycardia, but also episodes of SVT.  Doesn't look clearly like afib as I can see P's.  Will try dilt gtt with her tachycardia and d/c metop above.  Consider cards c/s.  3. Recently diagnosed diastolic CHF - last echo with elevated PASP.  Unable to eval diastolic dysfunction.  Currently holding lasix.  Appears relatively euvolemic. 1. Hold lasix, watch resp status with IVF  4. Hypothyroidism on Tapazole.  5. Diabetes mellitus type 2 -due to acute renal failure have decrease patient's long-acting insulin dose from 25 units to 10 units.  Closely follow CBGs.  6. Hypertension - Hold lisinopril with above. Continue atenolol.  DVT prophylaxis: heparin gtt Code Status: full  Family Communication: none at bedside Disposition Plan: specify when and where you expect patient to be discharged). Include barriers to DC in this tab.  Consultants:   none  Procedures:   none  Antimicrobials:  Anti-infectives (From admission, onward)   None      Subjective: No CP, no SOB. No complaints.  Objective: Vitals:   05/11/17 1430 05/11/17 1500 05/11/17 1630 05/11/17 1714  BP: 132/72 135/63 (!) 149/75   Pulse: 64 64 73   Resp: 13 16 16    Temp:    97.7 F (36.5 C)  TempSrc:    Oral  SpO2: 100% 100% 97%   Weight:    84.3 kg (185 lb 12.8 oz)  Height:    5\' 4"  (1.626 m)    Intake/Output Summary (Last 24 hours) at 05/11/2017  1721 Last data filed at 05/11/2017 0530 Gross per 24 hour  Intake 360 ml  Output -  Net 360 ml   Filed Weights   05/11/17 1714  Weight: 84.3 kg (185 lb 12.8 oz)    Examination:  General exam: Appears calm and comfortable  Respiratory system: Clear to auscultation. Respiratory effort normal. Cardiovascular system: S1 & S2 heard, RRR. No JVD, murmurs, rubs, gallops or clicks. No pedal edema. Gastrointestinal system: Abdomen is nondistended, soft and nontender. No organomegaly or masses felt. Normal bowel sounds heard. Central nervous system:  Alert and oriented. No focal neurological deficits. Extremities: Symmetric 5 x 5 power. Skin: No rashes, lesions or ulcers Psychiatry: Judgement and insight appear normal. Mood & affect appropriate.     Data Reviewed: I have personally reviewed following labs and imaging studies  CBC: Recent Labs  Lab 05/10/17 2314  WBC 11.7*  HGB 11.1*  HCT 35.9*  MCV 76.5*  PLT 279   Basic Metabolic Panel: Recent Labs  Lab 05/10/17 1146 05/10/17 2314 05/11/17 0619 05/11/17 1200  NA 133* 134* 142 143  K 7.0* >7.5* 6.6* 5.6*  CL 102 103 108 109  CO2 25 20* 24 24  GLUCOSE 236* 207* 132* 94  BUN 51* 52* 47* 44*  CREATININE 1.81* 2.03* 1.92* 1.80*  CALCIUM 10.0 9.6 10.5* 10.5*   GFR: Estimated Creatinine Clearance: 31 mL/min (Donna Booker) (by C-G formula based on SCr of 1.8 mg/dL (H)). Liver Function Tests: No results for input(s): AST, ALT, ALKPHOS, BILITOT, PROT, ALBUMIN in the last 168 hours. No results for input(s): LIPASE, AMYLASE in the last 168 hours. No results for input(s): AMMONIA in the last 168 hours. Coagulation Profile: No results for input(s): INR, PROTIME in the last 168 hours. Cardiac Enzymes: Recent Labs  Lab 05/10/17 2314 05/11/17 0639  CKTOTAL  --  35*  TROPONINI <0.03  --    BNP (last 3 results) No results for input(s): PROBNP in the last 8760 hours. HbA1C: No results for input(s): HGBA1C in the last 72 hours. CBG: Recent Labs  Lab 05/11/17 0821 05/11/17 1302  GLUCAP 114* 86   Lipid Profile: No results for input(s): CHOL, HDL, LDLCALC, TRIG, CHOLHDL, LDLDIRECT in the last 72 hours. Thyroid Function Tests: Recent Labs    05/10/17 1146  TSH <0.01 Repeated and verified X2.*  FREET4 1.15  T3FREE 4.2   Anemia Panel: No results for input(s): VITAMINB12, FOLATE, FERRITIN, TIBC, IRON, RETICCTPCT in the last 72 hours. Sepsis Labs: No results for input(s): PROCALCITON, LATICACIDVEN in the last 168 hours.  No results found for this or any previous visit (from  the past 240 hour(s)).       Radiology Studies: US Renal  Result Date: 05/11/2017 CLINICAL DATA:  Acute kidney injury. EXAM: RENAL / URINARY TRACT ULTRASOUND COMPLETE COMPARISON:  CT abdomen pelvis dated May 04, 2012. FINDINGS: Right Kidney: Length: 10.1 cm. Echogenicity within normal limits. No mass or hydronephrosis visualized. Two simple appearing cysts measuring up to 1.8 cm are noted. Left Kidney: Length: 11.9 cm. Echogenicity within normal limits. No mass or hydronephrosis visualized. Multiple cysts are noted, measuring up to 2.6 cm. Several of these appear minimally complicated, containing either heterogeneous debris or Chue Berkovich thin septation. No internal vascularity in any lesion. Bladder: Appears normal for degree of bladder distention. IMPRESSION: 1. No acute abnormality. 2. Bilateral renal cysts. Several left renal cysts are minimally complicated. Electronically Signed   By: Obie Dredge M.D.   On: 05/11/2017 10:43  Scheduled Meds: . aspirin EC  81 mg Oral Daily  . atenolol  50 mg Oral BID  . fluticasone furoate-vilanterol  1 puff Inhalation Daily  . insulin aspart  0-5 Units Subcutaneous QHS  . insulin aspart  0-9 Units Subcutaneous TID WC  . insulin detemir  10 Units Subcutaneous Q2200  . methimazole  5 mg Oral BID  . pantoprazole  40 mg Oral Daily  . pravastatin  40 mg Oral q1800  . sodium polystyrene  30 g Oral Once   Continuous Infusions: . sodium chloride    . heparin 1,000 Units/hr (05/11/17 0500)     LOS: 0 days    Time spent: over 30 min 35 minutes critical care time.  Pt with severe hyperkalemia and AKI requiring serial EKGs, labs, monitoring and medication management.    Lacretia Nicks, MD [facility] Pager [phone]  If 7PM-7AM, please contact night-coverage [email or web address] Password [facility] 05/11/2017, 5:21 PM

## 2017-05-12 ENCOUNTER — Encounter (HOSPITAL_COMMUNITY): Payer: Self-pay | Admitting: Cardiology

## 2017-05-12 ENCOUNTER — Observation Stay (HOSPITAL_COMMUNITY): Payer: BLUE CROSS/BLUE SHIELD

## 2017-05-12 DIAGNOSIS — E875 Hyperkalemia: Secondary | ICD-10-CM | POA: Diagnosis not present

## 2017-05-12 DIAGNOSIS — N179 Acute kidney failure, unspecified: Secondary | ICD-10-CM | POA: Diagnosis not present

## 2017-05-12 DIAGNOSIS — I471 Supraventricular tachycardia: Secondary | ICD-10-CM | POA: Diagnosis not present

## 2017-05-12 DIAGNOSIS — I5032 Chronic diastolic (congestive) heart failure: Secondary | ICD-10-CM | POA: Diagnosis not present

## 2017-05-12 LAB — BASIC METABOLIC PANEL
ANION GAP: 11 (ref 5–15)
Anion gap: 9 (ref 5–15)
Anion gap: 9 (ref 5–15)
BUN: 32 mg/dL — AB (ref 6–20)
BUN: 35 mg/dL — AB (ref 6–20)
BUN: 37 mg/dL — AB (ref 6–20)
CO2: 21 mmol/L — AB (ref 22–32)
CO2: 22 mmol/L (ref 22–32)
CO2: 23 mmol/L (ref 22–32)
CREATININE: 1.62 mg/dL — AB (ref 0.44–1.00)
CREATININE: 1.83 mg/dL — AB (ref 0.44–1.00)
Calcium: 8.5 mg/dL — ABNORMAL LOW (ref 8.9–10.3)
Calcium: 8.7 mg/dL — ABNORMAL LOW (ref 8.9–10.3)
Calcium: 8.9 mg/dL (ref 8.9–10.3)
Chloride: 108 mmol/L (ref 101–111)
Chloride: 109 mmol/L (ref 101–111)
Chloride: 109 mmol/L (ref 101–111)
Creatinine, Ser: 1.72 mg/dL — ABNORMAL HIGH (ref 0.44–1.00)
GFR calc Af Amer: 31 mL/min — ABNORMAL LOW (ref >60)
GFR calc Af Amer: 34 mL/min — ABNORMAL LOW (ref >60)
GFR calc Af Amer: 36 mL/min — ABNORMAL LOW (ref >60)
GFR calc non Af Amer: 29 mL/min — ABNORMAL LOW (ref >60)
GFR calc non Af Amer: 31 mL/min — ABNORMAL LOW (ref >60)
GFR, EST NON AFRICAN AMERICAN: 27 mL/min — AB (ref >60)
GLUCOSE: 170 mg/dL — AB (ref 65–99)
GLUCOSE: 246 mg/dL — AB (ref 65–99)
Glucose, Bld: 109 mg/dL — ABNORMAL HIGH (ref 65–99)
POTASSIUM: 5.5 mmol/L — AB (ref 3.5–5.1)
Potassium: 4.7 mmol/L (ref 3.5–5.1)
Potassium: 5.3 mmol/L — ABNORMAL HIGH (ref 3.5–5.1)
SODIUM: 140 mmol/L (ref 135–145)
Sodium: 141 mmol/L (ref 135–145)
Sodium: 140 mmol/L (ref 135–145)

## 2017-05-12 LAB — URINALYSIS, ROUTINE W REFLEX MICROSCOPIC
BILIRUBIN URINE: NEGATIVE
Glucose, UA: NEGATIVE mg/dL
Hgb urine dipstick: NEGATIVE
KETONES UR: NEGATIVE mg/dL
LEUKOCYTES UA: NEGATIVE
Nitrite: NEGATIVE
PH: 6 (ref 5.0–8.0)
PROTEIN: 30 mg/dL — AB
Specific Gravity, Urine: 1.014 (ref 1.005–1.030)

## 2017-05-12 LAB — CBC
HEMATOCRIT: 36.1 % (ref 36.0–46.0)
HEMOGLOBIN: 10.9 g/dL — AB (ref 12.0–15.0)
MCH: 23.6 pg — ABNORMAL LOW (ref 26.0–34.0)
MCHC: 30.2 g/dL (ref 30.0–36.0)
MCV: 78.3 fL (ref 78.0–100.0)
Platelets: 221 10*3/uL (ref 150–400)
RBC: 4.61 MIL/uL (ref 3.87–5.11)
RDW: 16.2 % — AB (ref 11.5–15.5)
WBC: 10.7 10*3/uL — ABNORMAL HIGH (ref 4.0–10.5)

## 2017-05-12 LAB — HEPARIN LEVEL (UNFRACTIONATED): Heparin Unfractionated: 0.63 IU/mL (ref 0.30–0.70)

## 2017-05-12 LAB — HEPATIC FUNCTION PANEL
ALK PHOS: 88 U/L (ref 38–126)
ALT: 73 U/L — AB (ref 14–54)
AST: 118 U/L — AB (ref 15–41)
Albumin: 2.9 g/dL — ABNORMAL LOW (ref 3.5–5.0)
BILIRUBIN DIRECT: 0.4 mg/dL (ref 0.1–0.5)
Indirect Bilirubin: 0.5 mg/dL (ref 0.3–0.9)
TOTAL PROTEIN: 6.2 g/dL — AB (ref 6.5–8.1)
Total Bilirubin: 0.9 mg/dL (ref 0.3–1.2)

## 2017-05-12 LAB — APTT
aPTT: 93 seconds — ABNORMAL HIGH (ref 24–36)
aPTT: 83 seconds — ABNORMAL HIGH (ref 24–36)

## 2017-05-12 LAB — GLUCOSE, CAPILLARY
GLUCOSE-CAPILLARY: 211 mg/dL — AB (ref 65–99)
GLUCOSE-CAPILLARY: 208 mg/dL — AB (ref 65–99)
Glucose-Capillary: 110 mg/dL — ABNORMAL HIGH (ref 65–99)

## 2017-05-12 LAB — MAGNESIUM: Magnesium: 1.4 mg/dL — ABNORMAL LOW (ref 1.7–2.4)

## 2017-05-12 LAB — TROPONIN I
Troponin I: <0.03 ng/mL (ref <0.03)
Troponin I: <0.03 ng/mL (ref <0.03)

## 2017-05-12 MED ORDER — MAGNESIUM SULFATE 2 GM/50ML IV SOLN
2.0000 g | Freq: Once | INTRAVENOUS | Status: AC
  Administered 2017-05-12: 2 g via INTRAVENOUS
  Filled 2017-05-12: qty 50

## 2017-05-12 MED ORDER — ATENOLOL 50 MG PO TABS
50.0000 mg | ORAL_TABLET | Freq: Two times a day (BID) | ORAL | Status: DC
  Administered 2017-05-12 – 2017-05-13 (×3): 50 mg via ORAL
  Filled 2017-05-12 (×3): qty 1

## 2017-05-12 MED ORDER — MAGNESIUM SULFATE 4 GM/100ML IV SOLN: 4.0000 g | Freq: Once | INTRAVENOUS | Status: DC

## 2017-05-12 MED ORDER — SENNOSIDES-DOCUSATE SODIUM 8.6-50 MG PO TABS
2.0000 | ORAL_TABLET | Freq: Every evening | ORAL | Status: DC | PRN
  Administered 2017-05-12: 2 via ORAL
  Filled 2017-05-12: qty 2

## 2017-05-12 MED ORDER — GABAPENTIN 300 MG PO CAPS
300.0000 mg | ORAL_CAPSULE | Freq: Two times a day (BID) | ORAL | Status: DC
  Administered 2017-05-12 – 2017-05-13 (×2): 300 mg via ORAL
  Filled 2017-05-12 (×2): qty 1

## 2017-05-12 MED ORDER — GLUCERNA SHAKE PO LIQD
237.0000 mL | Freq: Three times a day (TID) | ORAL | Status: DC
  Administered 2017-05-12 – 2017-05-13 (×3): 237 mL via ORAL

## 2017-05-12 NOTE — Progress Notes (Signed)
ANTICOAGULATION CONSULT NOTE - Follow Up Consult  Pharmacy Consult for Heparin (while Apixaban on hold) Indication: atrial fibrillation  Patient Measurements: Height: 5\' 4"  (162.6 cm) Weight: 185 lb 12.8 oz (84.3 kg) IBW/kg (Calculated) : 54.7 Heparin Dosing Weight: 63 kg  Vital Signs: Temp: 98.3 F (36.8 C) (05/11 0748) Temp Source: Oral (05/11 0748) BP: 124/96 (05/11 1154) Pulse Rate: 69 (05/11 1154)  Labs: Recent Labs    05/10/17 2314  05/11/17 0639  05/11/17 1931 05/11/17 2147 05/11/17 2330 05/12/17 0427 05/12/17 0713 05/12/17 1108  HGB 11.1*  --   --   --   --   --   --  10.9*  --   --   HCT 35.9*  --   --   --   --   --   --  36.1  --   --   PLT 279  --   --   --   --   --   --  221  --   --   APTT  --   --   --   --  52*  --   --   --  93*  --   HEPARINUNFRC  --   --   --   --  0.75*  --   --   --  0.63  --   CREATININE 2.03*   < >  --    < > 1.73*  --  1.83* 1.72*  --  1.62*  CKTOTAL  --   --  35*  --   --   --   --   --   --   --   TROPONINI <0.03  --   --   --   --  <0.03  --  <0.03  --  <0.03   < > = values in this interval not displayed.    Estimated Creatinine Clearance: 34.4 mL/min (A) (by C-G formula based on SCr of 1.62 mg/dL (H)).  Assessment: 43 YOF with Afib to continue on IV heparin while Eliquis is on hold. Currently using aPTT to guide heparin dosing because Eliquis falsely elevates heparin levels.  aPTT level this morning is therapeutic after a rate decrease yesterday evening (aPTT 93, goal of 66-102). Heparin level appears to still be slightly elevated - but likely can start utilizing these levels on 5/12 AM if heparin continues.   Goal of Therapy:  Heparin level 0.3-0.7 units/ml aPTT 66-102 seconds Monitor platelets by anticoagulation protocol: Yes   Plan:  1. Continue Heparin at 1150 units/hr (11.5 ml/hr) 2. Will continue to monitor for any signs/symptoms of bleeding and will follow up with aPTT in 8 hours to confirm  Thank you for  allowing pharmacy to be a part of this patient's care.  7/12, PharmD, BCPS Clinical Pharmacist Pager: [phone] Clinical phone for 05/12/2017 from 7a-3:30p: [phone] If after 3:30p, please call main pharmacy at: [phone] 05/12/2017 12:13 PM

## 2017-05-12 NOTE — Progress Notes (Addendum)
PROGRESS NOTE    Donna Booker  JJK:[id number] DOB: 08/06/1945 DOA: 05/10/2017 PCP: Myrlene Broker, MD   Brief Narrative:  Donna Booker is a 72 y.o. female with history of Graves' disease diabetes mellitus hypertension who was recently admitted for new onset atrial fibrillation and CHF had a routine visit with her endocrinologist and had a routine labs done which showed hyperkalemia with acute renal failure.  Patient was instructed to come to the ER.  Patient otherwise was asymptomatic denies any nausea vomiting diarrhea chest pain or shortness of breath fever or chills.  Patient was recently admitted for CHF and A. fib and was started on Lasix and lisinopril.  Denies taking NSAIDs.  Assessment & Plan:   Principal Problem:   Hyperkalemia Active Problems:   HTN (hypertension)   Uncontrolled type 2 diabetes mellitus with diabetic polyneuropathy, with long-term current use of insulin (HCC)   Hyperthyroidism   New onset of congestive heart failure (HCC)   History of atrial fibrillation   ARF (acute renal failure) (HCC)   1. Severe hyperkalemia with acute renal failure:   Suspect this is due to combination of lisinopril (started at last discharge), overdiuresis with lasix, as well as potassium supplementation.  Now improved, still mild hyperkalemia.  1. S/p calcium chloride in ED as well as insulin, D50, bicarb, and albuterol as well as kayexalate x1.  2. Repeated insulin, d50, bicarb this AM with improved but still elevated K with peaked T waves.   3. S/p kayexalate x 3  4. Follow renal US - minimally complicated renal cyst.  No hydro.  Follow up renal cysts outpatient.  5. IVF for hydration 6. Continue to hold lasix and lisinopril and supplemental potassium   2. Atrial fibrillation  Sinus Tach  SVT: in sinus rhythm at presentation, but developed sinus tachy with runs of SVT on 5/10 PM.  Placed on dilt gtt and now back in sinus rhythm. 1. Eliquis being held for renal  failure 2. Continue heparin gtt 3. D/c dilt gtt, restart atenolol 50 mg BID  3. Recently diagnosed diastolic CHF - last echo with elevated PASP.  Unable to eval diastolic dysfunction.  Currently holding lasix.  Appears relatively euvolemic. 1. Holding lasix, watch resp status with IVF  4. Hypothyroidism on Tapazole.  5. Diabetes mellitus type 2 -due to acute renal failure have decrease patient's long-acting insulin dose from 25 units to 10 units.  Closely follow CBGs.  6. Hypertension - Hold lisinopril with above. Continue atenolol.  7. Elevated Liver Enzymes: follow acute hepatitis panel.  Trend.  Consider RUQ [place].   8. Fever  Leukocytosis:  Follow blood cx, CXR, urinalysis.  No clear si/sx of infection.  Currently pt asymptomatic.   9. Hypomagnesemia: replete, follow  10. NAGMA: mild, follow with IVF   DVT prophylaxis: heparin gtt Code Status: full  Family Communication: none at bedside Disposition Plan: pending further improvement in kidney function and potassium.  Will change patient to inpatient given need for at least 2 midnights with hyperkalemia and AKI as well as arrhythmia in setting of prior.  Hopefully able to discharge tomorrow if continues to improve.    Consultants:   none  Procedures:   none  Antimicrobials:  Anti-infectives (From admission, onward)   None      Subjective: Fever last night. Feels better now.  Objective: Vitals:   05/12/17 0500 05/12/17 0548 05/12/17 0600 05/12/17 0748  BP:  (!) 126/53  (!) 134/52  Pulse: 64 66 65 65  Resp: 17 18 18 18   Temp:    98.3 F (36.8 C)  TempSrc:    Oral  SpO2: 97% 94% 97% 95%  Weight:      Height:        Intake/Output Summary (Last 24 hours) at 05/12/2017 0928 Last data filed at 05/12/2017 0852 Gross per 24 hour  Intake 2309.13 ml  Output -  Net 2309.13 ml   Filed Weights   05/11/17 1714  Weight: 84.3 kg (185 lb 12.8 oz)    Examination:  General: No acute distress. Cardiovascular: Heart  sounds show a regular rate, and rhythm. No gallops or rubs. No murmurs. No JVD. Lungs: Clear to auscultation bilaterally with good air movement. No rales, rhonchi or wheezes. Abdomen: Soft, nontender, nondistended with normal active bowel sounds. No masses. No hepatosplenomegaly. Neurological: Alert and oriented 3. Moves all extremities 4. Cranial nerves II through XII grossly intact. Skin: Warm and dry. No rashes or lesions. Extremities: No clubbing or cyanosis. No edema.  Psychiatric: Mood and affect are normal. Insight and judgment are appropriate.   Data Reviewed: I have personally reviewed following labs and imaging studies  CBC: Recent Labs  Lab 05/10/17 2314 05/12/17 0427  WBC 11.7* 10.7*  HGB 11.1* 10.9*  HCT 35.9* 36.1  MCV 76.5* 78.3  PLT 279 221   Basic Metabolic Panel: Recent Labs  Lab 05/11/17 0619 05/11/17 1200 05/11/17 1931 05/11/17 2330 05/12/17 0427  NA 142 143 141 140 141  K 6.6* 5.6* 5.4* 5.3* 5.5*  CL 108 109 107 109 109  CO2 24 24 23 22  21*  GLUCOSE 132* 94 142* 170* 109*  BUN 47* 44* 38* 37* 35*  CREATININE 1.92* 1.80* 1.73* 1.83* 1.72*  CALCIUM 10.5* 10.5* 9.6 8.9 8.7*  MG  --   --  1.4*  --  1.4*   GFR: Estimated Creatinine Clearance: 32.4 mL/min (A) (by C-G formula based on SCr of 1.72 mg/dL (H)). Liver Function Tests: Recent Labs  Lab 05/12/17 0427  AST 118*  ALT 73*  ALKPHOS 88  BILITOT 0.9  PROT 6.2*  ALBUMIN 2.9*   No results for input(s): LIPASE, AMYLASE in the last 168 hours. No results for input(s): AMMONIA in the last 168 hours. Coagulation Profile: No results for input(s): INR, PROTIME in the last 168 hours. Cardiac Enzymes: Recent Labs  Lab 05/10/17 2314 05/11/17 0639 05/11/17 2147 05/12/17 0427  CKTOTAL  --  35*  --   --   TROPONINI <0.03  --  <0.03 <0.03   BNP (last 3 results) No results for input(s): PROBNP in the last 8760 hours. HbA1C: No results for input(s): HGBA1C in the last 72 hours. CBG: Recent  Labs  Lab 05/11/17 0821 05/11/17 1302 05/11/17 1725 05/11/17 2132 05/12/17 0826  GLUCAP 114* 86 138* 142* 110*   Lipid Profile: No results for input(s): CHOL, HDL, LDLCALC, TRIG, CHOLHDL, LDLDIRECT in the last 72 hours. Thyroid Function Tests: Recent Labs    05/10/17 1146  TSH <0.01 Repeated and verified X2.*  FREET4 1.15  T3FREE 4.2   Anemia Panel: No results for input(s): VITAMINB12, FOLATE, FERRITIN, TIBC, IRON, RETICCTPCT in the last 72 hours. Sepsis Labs: No results for input(s): PROCALCITON, LATICACIDVEN in the last 168 hours.  No results found for this or any previous visit (from the past 240 hour(s)).       Radiology Studies: 07/12/17 Renal  Result Date: 05/11/2017 CLINICAL DATA:  Acute kidney injury. EXAM: RENAL / URINARY TRACT ULTRASOUND COMPLETE COMPARISON:  CT abdomen  pelvis dated May 04, 2012. FINDINGS: Right Kidney: Length: 10.1 cm. Echogenicity within normal limits. No mass or hydronephrosis visualized. Two simple appearing cysts measuring up to 1.8 cm are noted. Left Kidney: Length: 11.9 cm. Echogenicity within normal limits. No mass or hydronephrosis visualized. Multiple cysts are noted, measuring up to 2.6 cm. Several of these appear minimally complicated, containing either heterogeneous debris or a thin septation. No internal vascularity in any lesion. Bladder: Appears normal for degree of bladder distention. IMPRESSION: 1. No acute abnormality. 2. Bilateral renal cysts. Several left renal cysts are minimally complicated. Electronically Signed   By: Obie Dredge M.D.   On: 05/11/2017 10:43        Scheduled Meds: . aspirin EC  81 mg Oral Daily  . feeding supplement (ENSURE ENLIVE)  237 mL Oral BID BM  . fluticasone furoate-vilanterol  1 puff Inhalation Daily  . insulin aspart  0-5 Units Subcutaneous QHS  . insulin aspart  0-9 Units Subcutaneous TID WC  . insulin detemir  10 Units Subcutaneous Q2200  . methimazole  5 mg Oral BID  .  morphine injection  2  mg Intravenous Once  . pantoprazole  40 mg Oral Daily  . pravastatin  40 mg Oral q1800   Continuous Infusions: . sodium chloride 125 mL/hr at 05/12/17 0318  . diltiazem (CARDIZEM) infusion 10 mg/hr (05/12/17 0452)  . heparin 1,150 Units/hr (05/11/17 2100)     LOS: 0 days    Time spent: over 30 min   Lacretia Nicks, MD [facility] Pager [phone]  If 7PM-7AM, please contact night-coverage [email or web address] Password [facility] 05/12/2017, 9:28 AM

## 2017-05-12 NOTE — Progress Notes (Signed)
Initial Nutrition Assessment  DOCUMENTATION CODES:   Obesity unspecified  INTERVENTION:  Glucerna Shake po TID, each supplement provides 220 kcal and 10 grams of protein  NUTRITION DIAGNOSIS:   Increased nutrient needs related to chronic illness, acute illness as evidenced by estimated needs.  GOAL:   Patient will meet greater than or equal to 90% of their needs  MONITOR:   PO intake, Supplement acceptance, Weight trends  REASON FOR ASSESSMENT:   Malnutrition Screening Tool    ASSESSMENT:   Donna Booker is a 72 y.o. female with history of Graves' disease diabetes mellitus hypertension who was recently admitted for new onset atrial fibrillation and CHF had a routine visit with her endocrinologist and had a routine labs done which showed hyperkalemia with acute renal failure  RD drawn to patient for positive MST. Spoke with Donna Booker at bedside.  She reports a usual PO intake of coffee and oatmeal, sometimes with eggs and toast for breakfast, and salads for lunch with cheese, blue cheese dressing and veggies. Also drinks a glucerna daily. She had an ensure at bedside this AM, drank most of it but wanted to be switched to glucerna.  She reports that a UBW of 207 pounds around April 19th, with weight loss to 184 pounds upon this admission. Per chart, she was 201 pounds 2.5 weeks ago (4/23), exhibiting a 16 pound/8% severe weight loss over 2 weeks.  Patient reports decreased appetite, but not decreasing PO intake, she is unsure why she has lost weight. May be related to new diagnosis of A-Fib and CHF which she was recently admitted for on 4/20  Labs reviewed:  CBGs 211, 110, 142 BUN/Creatinine 32/1.62 Mg 1.4 Elevated LFTs  Medications reviewed and include:  Insulin Heparin gtt  NUTRITION - FOCUSED PHYSICAL EXAM:    Most Recent Value  Orbital Region  No depletion  Upper Arm Region  Moderate depletion  Thoracic and Lumbar Region  No depletion  Buccal Region  No  depletion  Temple Region  No depletion  Clavicle Bone Region  No depletion  Clavicle and Acromion Bone Region  No depletion  Scapular Bone Region  No depletion  Dorsal Hand  No depletion  Patellar Region  No depletion  Anterior Thigh Region  No depletion  Posterior Calf Region  No depletion       Diet Order:   Diet Order           Diet heart healthy/carb modified Room service appropriate? Yes; Fluid consistency: Thin; Fluid restriction: 1200 mL Fluid  Diet effective now          EDUCATION NEEDS:   Not appropriate for education at this time  Skin:  Skin Assessment: Reviewed RN Assessment  Last BM:  05/04/2017  Height:   Ht Readings from Last 1 Encounters:  05/11/17 5\' 4"  (1.626 m)    Weight:   Wt Readings from Last 1 Encounters:  05/11/17 185 lb 12.8 oz (84.3 kg)    Ideal Body Weight:  54.54 kg  BMI:  Body mass index is 31.89 kg/m.  Estimated Nutritional Needs:   Kcal:  1630-1900 calories (MSJ x1.2-1.4)  Protein:  84-109 grams (1-1.3g/kg)  Fluid:  UOP +524mL  80m. Donna Formica, MS, RD LDN Inpatient Clinical Dietitian Pager [phone]

## 2017-05-12 NOTE — Progress Notes (Signed)
ANTICOAGULATION CONSULT NOTE - Follow Up Consult  Pharmacy Consult for Heparin (while Apixaban on hold) Indication: atrial fibrillation  Patient Measurements: Height: 5\' 4"  (162.6 cm) Weight: 185 lb 12.8 oz (84.3 kg) IBW/kg (Calculated) : 54.7 Heparin Dosing Weight: 63 kg  Vital Signs: Temp: 98.3 F (36.8 C) (05/11 0748) Temp Source: Oral (05/11 0748) BP: 124/96 (05/11 1154) Pulse Rate: 69 (05/11 1154)  Labs: Recent Labs    05/10/17 2314  05/11/17 0639  05/11/17 1931 05/11/17 2147 05/11/17 2330 05/12/17 0427 05/12/17 0713 05/12/17 1108 05/12/17 1440  HGB 11.1*  --   --   --   --   --   --  10.9*  --   --   --   HCT 35.9*  --   --   --   --   --   --  36.1  --   --   --   PLT 279  --   --   --   --   --   --  221  --   --   --   APTT  --   --   --   --  52*  --   --   --  93*  --  83*  HEPARINUNFRC  --   --   --   --  0.75*  --   --   --  0.63  --   --   CREATININE 2.03*   < >  --    < > 1.73*  --  1.83* 1.72*  --  1.62*  --   CKTOTAL  --   --  35*  --   --   --   --   --   --   --   --   TROPONINI <0.03  --   --   --   --  <0.03  --  <0.03  --  <0.03  --    < > = values in this interval not displayed.    Estimated Creatinine Clearance: 34.4 mL/min (A) (by C-G formula based on SCr of 1.62 mg/dL (H)).  Assessment: 20 YOF with Afib to continue on IV heparin while Eliquis is on hold for AKI. Currently using aPTT to guide heparin dosing because Eliquis falsely elevates heparin levels.  Confirmatory aPTT this afternoon remains therapeutic at 83. CBC stable. No bleed documented. Heparin level down to 0.63 this AM - may be able to follow heparin levels only in the AM.  Goal of Therapy:  Heparin level 0.3-0.7 units/ml aPTT 66-102 seconds Monitor platelets by anticoagulation protocol: Yes   Plan:  Continue Heparin at 1150 units/hr (11.5 ml/hr) Monitor daily heparin level and CBC, s/sx bleeding F/u ability to resume apixaban as appropriate  78, PharmD,  BCPS Clinical Pharmacist 05/12/2017 3:41 PM

## 2017-05-12 NOTE — Consult Note (Signed)
Reason for Consult: Tachycarda Referring Physician: [facility] hospitalist  Donna Booker is an 72 y.o. female.  HPI:   71 year old African American female with new diagnosis of Graves' disease with hyperthyroidism, atrial fibrillation with RVR, uncontrolled type II diabetes mellitus with diabetic polyneuropathy, CK stage III, microcytic anemia, hyperlipidemia, morbid obesity, had hospital admissions with Afib with RVR and HFpEF exacerbation.  Patient was admitted earlier this week after she was found to have hyperkalemia and AKI on labwork performed at [facility] end[facility]ology. She was on lisinopril 20 mg daily and lasix 40 mg PO bid. Patient did not have any symptoms related to this. She had an episode of tachycardia on 05/11/2017 evening. She denies chest pain, shortness of breath, leg edema, presyncope or syncope.   Review of telemetry shows sinus rhythm with PAC's followed by SVT. No Afib seen.   Past Medical History:  Diagnosis Date  . Diabetes mellitus   . Hypertension     Past Surgical History:  Procedure Laterality Date  . BREAST SURGERY      Family History  Problem Relation Age of Onset  . Diabetes Mellitus II Mother   . Diabetes Mellitus II Father   . Hypertension Father   . Hyperlipidemia Father     Social History:  reports that she has never smoked. She has never used smokeless tobacco. She reports that she does not drink alcohol or use drugs.  Allergies:  Allergies  Allergen Reactions  . Tramadol Nausea And Vomiting    Medications: I have reviewed the patient's current medications.  Results for orders placed or performed during the hospital encounter of 05/10/17 (from the past 48 hour(s))  CBC     Status: Abnormal   Collection Time: 05/10/17 11:14 PM  Result Value Ref Range   WBC 11.7 (H) 4.0 - 10.5 K/uL   RBC 4.69 3.87 - 5.11 MIL/uL   Hemoglobin 11.1 (L) 12.0 - 15.0 g/dL   HCT 35.9 (L) 36.0 - 46.0 %   MCV 76.5 (L) 78.0 - 100.0 fL   MCH 23.7 (L) 26.0 - 34.0  pg   MCHC 30.9 30.0 - 36.0 g/dL   RDW 15.4 11.5 - 15.5 %   Platelets 279 150 - 400 K/uL    Comment: Performed at [facility], [place] [address] [place], [place] [address]  Basic metabolic panel     Status: Abnormal   Collection Time: 05/10/17 11:14 PM  Result Value Ref Range   Sodium 134 (L) 135 - 145 mmol/L   Potassium >7.5 (HH) 3.5 - 5.1 mmol/L    Comment: NO VISIBLE HEMOLYSIS CRITICAL RESULT CALLED TO, READ BACK BY AND VERIFIED WITH: PRUETT K,RN 05/11/17 0032 WAYK    Chloride 103 101 - 111 mmol/L   CO2 20 (L) 22 - 32 mmol/L   Glucose, Bld 207 (H) 65 - 99 mg/dL   BUN 52 (H) 6 - 20 mg/dL   Creatinine, Ser 2.03 (H) 0.44 - 1.00 mg/dL   Calcium 9.6 8.9 - 10.3 mg/dL   GFR calc non Af Amer 24 (L) >60 mL/min   GFR calc Af Amer 28 (L) >60 mL/min    Comment: (NOTE) The eGFR has been calculated using the CKD EPI equation. This calculation has not been validated in all clinical situations. eGFR's persistently <60 mL/min signify possible Chronic Kidney Disease.    Anion gap 11 5 - 15    Comment: Performed at [facility] [address], [place], [place] [address]  Troponin I  Status: None   Collection Time: 05/10/17 11:14 PM  Result Value Ref Range   Troponin I <0.03 <0.03 ng/mL    Comment: Performed at [facility] [address], [place], [place] [address]  Basic metabolic panel     Status: Abnormal   Collection Time: 05/11/17  6:19 AM  Result Value Ref Range   Sodium 142 135 - 145 mmol/L   Potassium 6.6 (HH) 3.5 - 5.1 mmol/L    Comment: NO VISIBLE HEMOLYSIS CRITICAL RESULT CALLED TO, READ BACK BY AND VERIFIED WITH: A.GORTON,RN 0855 05/11/17 CLARK,S    Chloride 108 101 - 111 mmol/L   CO2 24 22 - 32 mmol/L   Glucose, Bld 132 (H) 65 - 99 mg/dL   BUN 47 (H) 6 - 20 mg/dL   Creatinine, Ser 1.92 (H) 0.44 - 1.00 mg/dL   Calcium 10.5 (H) 8.9 - 10.3 mg/dL   GFR calc non Af Amer 26 (L) >60 mL/min   GFR calc Af Amer 30 (L) >60 mL/min    Comment: (NOTE) The eGFR  has been calculated using the CKD EPI equation. This calculation has not been validated in all clinical situations. eGFR's persistently <60 mL/min signify possible Chronic Kidney Disease.    Anion gap 10 5 - 15    Comment: Performed at [facility] [address], [place], [place] [address]  CK     Status: Abnormal   Collection Time: 05/11/17  6:39 AM  Result Value Ref Range   Total CK 35 (L) 38 - 234 U/L    Comment: Performed at [facility], [place] [address], [place], [place] [address]  Urinalysis, Routine w reflex microscopic     Status: Abnormal   Collection Time: 05/11/17  6:51 AM  Result Value Ref Range   Color, Urine STRAW (A) YELLOW   APPearance CLEAR CLEAR   Specific Gravity, Urine 1.008 1.005 - 1.030   pH 7.0 5.0 - 8.0   Glucose, UA 50 (A) NEGATIVE mg/dL   Hgb urine dipstick NEGATIVE NEGATIVE   Bilirubin Urine NEGATIVE NEGATIVE   Ketones, ur NEGATIVE NEGATIVE mg/dL   Protein, ur NEGATIVE NEGATIVE mg/dL   Nitrite NEGATIVE NEGATIVE   Leukocytes, UA NEGATIVE NEGATIVE    Comment: Performed at [facility] [address] [place], [place] [address]  Sodium, urine, random     Status: None   Collection Time: 05/11/17  6:51 AM  Result Value Ref Range   Sodium, Ur 65 mmol/L    Comment: Performed at [facility] [address], [place], [place] [address]  Creatinine, urine, random     Status: None   Collection Time: 05/11/17  6:51 AM  Result Value Ref Range   Creatinine, Urine 44.05 mg/dL    Comment: Performed at [facility], [place] [address], [place], [place] [address]  Na and K (sodium & potassium), rand urine     Status: None   Collection Time: 05/11/17  6:51 AM  Result Value Ref Range   Sodium, Ur 63 mmol/L   Potassium Urine 43 mmol/L    Comment: Performed at [facility] [address], [place],  [address]  CBG monitoring, ED     Status: Abnormal   Collection Time: 05/11/17  8:21 AM  Result Value Ref Range    Glucose-Capillary 114 (H) 65 - 99 mg/dL   Comment 1 Notify RN    Comment 2 Document in Chart   Basic metabolic  panel     Status: Abnormal   Collection Time: 05/11/17 12:00 PM  Result Value Ref Range   Sodium 143 135 - 145 mmol/L   Potassium 5.6 (H) 3.5 - 5.1 mmol/L   Chloride 109 101 - 111 mmol/L   CO2 24 22 - 32 mmol/L   Glucose, Bld 94 65 - 99 mg/dL   BUN 44 (H) 6 - 20 mg/dL   Creatinine, Ser 1.80 (H) 0.44 - 1.00 mg/dL   Calcium 10.5 (H) 8.9 - 10.3 mg/dL   GFR calc non Af Amer 28 (L) >60 mL/min   GFR calc Af Amer 32 (L) >60 mL/min    Comment: (NOTE) The eGFR has been calculated using the CKD EPI equation. This calculation has not been validated in all clinical situations. eGFR's persistently <60 mL/min signify possible Chronic Kidney Disease.    Anion gap 10 5 - 15    Comment: Performed at [facility] [address] [place], [place] [address]  CBG monitoring, ED     Status: None   Collection Time: 05/11/17  1:02 PM  Result Value Ref Range   Glucose-Capillary 86 65 - 99 mg/dL  Glucose, capillary     Status: Abnormal   Collection Time: 05/11/17  5:25 PM  Result Value Ref Range   Glucose-Capillary 138 (H) 65 - 99 mg/dL  Heparin level (unfractionated)     Status: Abnormal   Collection Time: 05/11/17  7:31 PM  Result Value Ref Range   Heparin Unfractionated 0.75 (H) 0.30 - 0.70 IU/mL    Comment:        IF HEPARIN RESULTS ARE BELOW EXPECTED VALUES, AND PATIENT DOSAGE HAS BEEN CONFIRMED, SUGGEST FOLLOW UP TESTING OF ANTITHROMBIN III LEVELS. Performed at [facility], [place] [address] [place], [place] [address]   APTT     Status: Abnormal   Collection Time: 05/11/17  7:31 PM  Result Value Ref Range   aPTT 52 (H) 24 - 36 seconds    Comment:        IF BASELINE aPTT IS ELEVATED, SUGGEST PATIENT RISK ASSESSMENT BE USED TO DETERMINE APPROPRIATE ANTICOAGULANT THERAPY. Performed at [facility], [place] [address] [place], [place] [address]   Basic metabolic  panel     Status: Abnormal   Collection Time: 05/11/17  7:31 PM  Result Value Ref Range   Sodium 141 135 - 145 mmol/L   Potassium 5.4 (H) 3.5 - 5.1 mmol/L   Chloride 107 101 - 111 mmol/L   CO2 23 22 - 32 mmol/L   Glucose, Bld 142 (H) 65 - 99 mg/dL   BUN 38 (H) 6 - 20 mg/dL   Creatinine, Ser 1.73 (H) 0.44 - 1.00 mg/dL   Calcium 9.6 8.9 - 10.3 mg/dL   GFR calc non Af Amer 29 (L) >60 mL/min   GFR calc Af Amer 34 (L) >60 mL/min    Comment: (NOTE) The eGFR has been calculated using the CKD EPI equation. This calculation has not been validated in all clinical situations. eGFR's persistently <60 mL/min signify possible Chronic Kidney Disease.    Anion gap 11 5 - 15    Comment: Performed at [facility] [address] [place], [place] [address]  Magnesium     Status: Abnormal   Collection Time: 05/11/17  7:31 PM  Result Value Ref Range   Magnesium 1.4 (L) 1.7 - 2.4 mg/dL    Comment: Performed at [facility]  [address] [place], [place] [address]  Glucose, capillary     Status: Abnormal   Collection Time: 05/11/17  9:32 PM  Result Value Ref Range   Glucose-Capillary 142 (H) 65 - 99 mg/dL  Culture, blood (routine x 2)     Status: None (Preliminary result)   Collection Time: 05/11/17  9:33 PM  Result Value Ref Range   Specimen Description BLOOD LEFT FOREARM    Special Requests      BOTTLES DRAWN AEROBIC AND ANAEROBIC Blood Culture adequate volume   Culture      NO GROWTH < 12 HOURS Performed at [facility], [place] [address], [place], [place] [address]    Report Status PENDING   Culture, blood (routine x 2)     Status: None (Preliminary result)   Collection Time: 05/11/17  9:43 PM  Result Value Ref Range   Specimen Description BLOOD LEFT HAND    Special Requests      BOTTLES DRAWN AEROBIC ONLY Blood Culture adequate volume   Culture      NO GROWTH < 12 HOURS Performed at [facility] [address] [place], [place] [address]    Report Status  PENDING   Troponin I (q 6hr x 3)     Status: None   Collection Time: 05/11/17  9:47 PM  Result Value Ref Range   Troponin I <0.03 <0.03 ng/mL    Comment: Performed at [facility] [address], [place], [place] [address]  Basic metabolic panel     Status: Abnormal   Collection Time: 05/11/17 11:30 PM  Result Value Ref Range   Sodium 140 135 - 145 mmol/L   Potassium 5.3 (H) 3.5 - 5.1 mmol/L   Chloride 109 101 - 111 mmol/L   CO2 22 22 - 32 mmol/L   Glucose, Bld 170 (H) 65 - 99 mg/dL   BUN 37 (H) 6 - 20 mg/dL   Creatinine, Ser 1.83 (H) 0.44 - 1.00 mg/dL   Calcium 8.9 8.9 - 10.3 mg/dL   GFR calc non Af Amer 27 (L) >60 mL/min   GFR calc Af Amer 31 (L) >60 mL/min    Comment: (NOTE) The eGFR has been calculated using the CKD EPI equation. This calculation has not been validated in all clinical situations. eGFR's persistently <60 mL/min signify possible Chronic Kidney Disease.    Anion gap 9 5 - 15    Comment: Performed at [facility] [address], [place], [place] [address]  CBC     Status: Abnormal   Collection Time: 05/12/17  4:27 AM  Result Value Ref Range   WBC 10.7 (H) 4.0 - 10.5 K/uL   RBC 4.61 3.87 - 5.11 MIL/uL   Hemoglobin 10.9 (L) 12.0 - 15.0 g/dL   HCT 36.1 36.0 - 46.0 %   MCV 78.3 78.0 - 100.0 fL   MCH 23.6 (L) 26.0 - 34.0 pg   MCHC 30.2 30.0 - 36.0 g/dL   RDW 16.2 (H) 11.5 - 15.5 %   Platelets 221 150 - 400 K/uL    Comment: Performed at [facility], [place] [address] [place], [place] [address]  Magnesium     Status: Abnormal   Collection Time: 05/12/17  4:27 AM  Result Value Ref Range   Magnesium 1.4 (L) 1.7 - 2.4 mg/dL    Comment: Performed at [facility] [address] [place], [place] [address]  Hepatic function panel     Status:  Abnormal   Collection Time: 05/12/17  4:27 AM  Result Value Ref Range   Total Protein 6.2 (L) 6.5 - 8.1 g/dL   Albumin 2.9 (L) 3.5 - 5.0 g/dL   AST 118 (H) 15 - 41 U/L   ALT 73 (H) 14 - 54 U/L    Alkaline Phosphatase 88 38 - 126 U/L   Total Bilirubin 0.9 0.3 - 1.2 mg/dL   Bilirubin, Direct 0.4 0.1 - 0.5 mg/dL   Indirect Bilirubin 0.5 0.3 - 0.9 mg/dL    Comment: Performed at [facility] [address] [place], [place] [address]  Basic metabolic panel     Status: Abnormal   Collection Time: 05/12/17  4:27 AM  Result Value Ref Range   Sodium 141 135 - 145 mmol/L   Potassium 5.5 (H) 3.5 - 5.1 mmol/L    Comment: SLIGHT HEMOLYSIS   Chloride 109 101 - 111 mmol/L   CO2 21 (L) 22 - 32 mmol/L   Glucose, Bld 109 (H) 65 - 99 mg/dL   BUN 35 (H) 6 - 20 mg/dL   Creatinine, Ser 1.72 (H) 0.44 - 1.00 mg/dL   Calcium 8.7 (L) 8.9 - 10.3 mg/dL   GFR calc non Af Amer 29 (L) >60 mL/min   GFR calc Af Amer 34 (L) >60 mL/min    Comment: (NOTE) The eGFR has been calculated using the CKD EPI equation. This calculation has not been validated in all clinical situations. eGFR's persistently <60 mL/min signify possible Chronic Kidney Disease.    Anion gap 11 5 - 15    Comment: Performed at [facility] [address] [place], [place] [address]  Troponin I (q 6hr x 3)     Status: None   Collection Time: 05/12/17  4:27 AM  Result Value Ref Range   Troponin I <0.03 <0.03 ng/mL    Comment: Performed at [facility] [address], [place], [place] [address]  Heparin level (unfractionated)     Status: None   Collection Time: 05/12/17  7:13 AM  Result Value Ref Range   Heparin Unfractionated 0.63 0.30 - 0.70 IU/mL    Comment:        IF HEPARIN RESULTS ARE BELOW EXPECTED VALUES, AND PATIENT DOSAGE HAS BEEN CONFIRMED, SUGGEST FOLLOW UP TESTING OF ANTITHROMBIN III LEVELS. Performed at [facility], [place] [address], [place], [place] [address]   APTT     Status: Abnormal   Collection Time: 05/12/17  7:13 AM  Result Value Ref Range   aPTT 93 (H) 24 - 36 seconds    Comment:        IF BASELINE aPTT IS ELEVATED, SUGGEST PATIENT RISK ASSESSMENT BE USED TO DETERMINE  APPROPRIATE ANTICOAGULANT THERAPY. Performed at [facility], [place] [address] [place], [place] [address]   Glucose, capillary     Status: Abnormal   Collection Time: 05/12/17  8:26 AM  Result Value Ref Range   Glucose-Capillary 110 (H) 65 - 99 mg/dL  Troponin I (q 6hr x 3)     Status: None   Collection Time: 05/12/17 11:08 AM  Result Value Ref Range   Troponin I <0.03 <0.03 ng/mL    Comment: Performed at [facility], [place] [address] [place], [place] [address]  Basic metabolic panel     Status: Abnormal   Collection Time: 05/12/17 11:08 AM  Result Value Ref Range   Sodium 140 135 - 145 mmol/L   Potassium  4.7 3.5 - 5.1 mmol/L   Chloride 108 101 - 111 mmol/L   CO2 23 22 - 32 mmol/L   Glucose, Bld 246 (H) 65 - 99 mg/dL   BUN 32 (H) 6 - 20 mg/dL   Creatinine, Ser 1.62 (H) 0.44 - 1.00 mg/dL   Calcium 8.5 (L) 8.9 - 10.3 mg/dL   GFR calc non Af Amer 31 (L) >60 mL/min   GFR calc Af Amer 36 (L) >60 mL/min    Comment: (NOTE) The eGFR has been calculated using the CKD EPI equation. This calculation has not been validated in all clinical situations. eGFR's persistently <60 mL/min signify possible Chronic Kidney Disease.    Anion gap 9 5 - 15    Comment: Performed at [facility] [address] [place], [place] [address]  Glucose, capillary     Status: Abnormal   Collection Time: 05/12/17 12:23 PM  Result Value Ref Range   Glucose-Capillary 211 (H) 65 - 99 mg/dL    Dg Chest 2 View  Result Date: 05/12/2017 CLINICAL DATA:  Fever EXAM: CHEST - 2 VIEW COMPARISON:  04/20/2017; 03/04/2017; chest CT-04/21/2017 FINDINGS: Grossly unchanged borderline enlarged cardiac silhouette and mediastinal contours. Resolved right-sided pleural effusion. No evidence of edema. No focal airspace opacities. No pleural effusion or pneumothorax. No acute osseus abnormalities. Post lower cervical ACDF, incompletely evaluated. IMPRESSION: No acute cardiopulmonary disease. Specifically, no  evidence of pneumonia. Electronically Signed   By: Sandi Mariscal M.D.   On: 05/12/2017 12:56   US Renal  Result Date: 05/11/2017 CLINICAL DATA:  Acute kidney injury. EXAM: RENAL / URINARY TRACT ULTRASOUND COMPLETE COMPARISON:  CT abdomen pelvis dated May 04, 2012. FINDINGS: Right Kidney: Length: 10.1 cm. Echogenicity within normal limits. No mass or hydronephrosis visualized. Two simple appearing cysts measuring up to 1.8 cm are noted. Left Kidney: Length: 11.9 cm. Echogenicity within normal limits. No mass or hydronephrosis visualized. Multiple cysts are noted, measuring up to 2.6 cm. Several of these appear minimally complicated, containing either heterogeneous debris or a thin septation. No internal vascularity in any lesion. Bladder: Appears normal for degree of bladder distention. IMPRESSION: 1. No acute abnormality. 2. Bilateral renal cysts. Several left renal cysts are minimally complicated. Electronically Signed   By: Titus Dubin M.D.   On: 05/11/2017 10:43    Review of Systems  Constitutional: Negative for chills and fever.  HENT: Negative.   Respiratory: Negative for sputum production and shortness of breath.   Cardiovascular: Negative for chest pain, palpitations, claudication and leg swelling.  Gastrointestinal: Negative for abdominal pain, nausea and vomiting.  Genitourinary: Negative.   Musculoskeletal: Negative.   Neurological: Negative for dizziness, loss of consciousness and headaches.  Endo/Heme/Allergies: Does not bruise/bleed easily.  Psychiatric/Behavioral: Negative.   All other systems reviewed and are negative.  Blood pressure (!) 124/96, pulse 69, temperature 98.3 F (36.8 C), temperature source Oral, resp. rate (!) 28, height _0  (1.626 m), weight 84.3 kg (185 lb 12.8 oz), SpO2 97 %. Physical Exam  Nursing note and vitals reviewed. Constitutional: She is oriented to person, place, and time. She appears well-developed and well-nourished.  HENT:  Head:  Normocephalic and atraumatic.  Eyes: Pupils are equal, round, and reactive to light. Conjunctivae are normal.  Neck: Normal range of motion. Neck supple. No JVD present.  Cardiovascular: Normal rate and regular rhythm.  Respiratory: Effort normal and breath sounds normal. She has no wheezes. She has no rales.  GI: Soft. Bowel sounds are normal.  Musculoskeletal: Normal  range of motion. She exhibits no edema.  Lymphadenopathy:    She has no cervical adenopathy.  Neurological: She is alert and oriented to person, place, and time.  Skin: Skin is warm and dry.  Psychiatric: She has a normal mood and affect.    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 HFpEF: Stable Mod pulmonary hypertension: Stable. Anemia of chronic disease Hb 9-10 g/dl Type 2 diabetes mellitus Moderate obesity   Recommendations: Agree with holding lisinopril and lasix. She will need workup for renal artery stenosis. Will arrange this outpatient.  Wean off diltiazem. Could resume atenolol 50 mg bid.  Continue Eliquis 5 mg twice a day for anticoagulation. Need to monitor her hemoglobin while on anticoagulation. Currently, no obvious signs of bleeding. No additional treatment required at this time Management of hyperthyroidism and diabetes mellitus, as you are doing. Will arrange outpatient follow up.    Manish J Patwardhan 05/12/2017, 1:41 PM   [place], MD [facility] Pager: [phone] Office: [phone] If no answer Cell [phone]

## 2017-05-13 DIAGNOSIS — E875 Hyperkalemia: Secondary | ICD-10-CM | POA: Diagnosis not present

## 2017-05-13 LAB — CBC
HCT: 30.1 % — ABNORMAL LOW (ref 36.0–46.0)
HEMOGLOBIN: 9.1 g/dL — AB (ref 12.0–15.0)
MCH: 22.9 pg — AB (ref 26.0–34.0)
MCHC: 30.2 g/dL (ref 30.0–36.0)
MCV: 75.8 fL — ABNORMAL LOW (ref 78.0–100.0)
Platelets: 231 10*3/uL (ref 150–400)
RBC: 3.97 MIL/uL (ref 3.87–5.11)
RDW: 15.6 % — ABNORMAL HIGH (ref 11.5–15.5)
WBC: 12.1 10*3/uL — ABNORMAL HIGH (ref 4.0–10.5)

## 2017-05-13 LAB — HEPATITIS PANEL, ACUTE
HEP A IGM: NEGATIVE
Hep B C IgM: NEGATIVE
Hepatitis B Surface Ag: NEGATIVE

## 2017-05-13 LAB — GLUCOSE, CAPILLARY
GLUCOSE-CAPILLARY: 269 mg/dL — AB (ref 65–99)
GLUCOSE-CAPILLARY: 102 mg/dL — AB (ref 65–99)

## 2017-05-13 LAB — COMPREHENSIVE METABOLIC PANEL
ALBUMIN: 2.6 g/dL — AB (ref 3.5–5.0)
ALT: 62 U/L — ABNORMAL HIGH (ref 14–54)
ANION GAP: 8 (ref 5–15)
AST: 58 U/L — ABNORMAL HIGH (ref 15–41)
Alkaline Phosphatase: 81 U/L (ref 38–126)
BILIRUBIN TOTAL: 0.9 mg/dL (ref 0.3–1.2)
BUN: 23 mg/dL — ABNORMAL HIGH (ref 6–20)
CO2: 25 mmol/L (ref 22–32)
Calcium: 8.9 mg/dL (ref 8.9–10.3)
Chloride: 109 mmol/L (ref 101–111)
Creatinine, Ser: 1.33 mg/dL — ABNORMAL HIGH (ref 0.44–1.00)
GFR calc Af Amer: 46 mL/min — ABNORMAL LOW (ref >60)
GFR calc non Af Amer: 40 mL/min — ABNORMAL LOW (ref >60)
GLUCOSE: 123 mg/dL — AB (ref 65–99)
POTASSIUM: 3.8 mmol/L (ref 3.5–5.1)
Sodium: 142 mmol/L (ref 135–145)
TOTAL PROTEIN: 6 g/dL — AB (ref 6.5–8.1)

## 2017-05-13 LAB — HEPARIN LEVEL (UNFRACTIONATED): HEPARIN UNFRACTIONATED: 0.43 [IU]/mL (ref 0.30–0.70)

## 2017-05-13 LAB — APTT: APTT: 92 s — AB (ref 24–36)

## 2017-05-13 LAB — HEMOGLOBIN AND HEMATOCRIT, BLOOD
HCT: 32.8 % — ABNORMAL LOW (ref 36.0–46.0)
Hemoglobin: 10 g/dL — ABNORMAL LOW (ref 12.0–15.0)

## 2017-05-13 LAB — MAGNESIUM: Magnesium: 1.5 mg/dL — ABNORMAL LOW (ref 1.7–2.4)

## 2017-05-13 MED ORDER — APIXABAN 5 MG PO TABS
5.0000 mg | ORAL_TABLET | Freq: Two times a day (BID) | ORAL | Status: DC
  Administered 2017-05-13: 5 mg via ORAL
  Filled 2017-05-13: qty 1

## 2017-05-13 MED ORDER — ATENOLOL 50 MG PO TABS: 50.0000 mg | ORAL_TABLET | Freq: Two times a day (BID) | ORAL | 0 refills | Status: DC

## 2017-05-13 MED ORDER — MAGNESIUM SULFATE 2 GM/50ML IV SOLN
2.0000 g | Freq: Once | INTRAVENOUS | Status: AC
  Administered 2017-05-13: 2 g via INTRAVENOUS
  Filled 2017-05-13: qty 50

## 2017-05-13 NOTE — Discharge Summary (Signed)
Physician Discharge Summary  [name] XLK:[id number] DOB: May 04, 1945 DOA: 05/10/2017  PCP: Myrlene Broker, MD  Admit date: 05/10/2017 Discharge date: 05/13/2017  Time spent: 35 minutes  Recommendations for Outpatient Follow-up:  1. Follow up CBC/CMP/magnesium as outpatient 2. Follow up BP as outpatient.  Lisinopril, lasix have been d/c'd.  Adjust regimen as needed. 3. Follow up volume status as outpatient with d/c'ing lasix.  4. Follow up renal function. 5. Follow up complex renal cyst, pt may need additional imaging 6. Follow up with cardiology as outpatient (recommending considering w/u for renal artery stenosis) 7. Continue treatment of hyperthyroidism.  Follow up with endocrine. 8. Elevated liver enzymes here, follow outpatient.  Consider RUQ [place] if persistent.  9. Pt with isolated fever x1 here.  Follow blood cx.   Discharge Diagnoses:  Principal Problem:   Hyperkalemia Active Problems:   HTN (hypertension)   Uncontrolled type 2 diabetes mellitus with diabetic polyneuropathy, with long-term current use of insulin (HCC)   Hyperthyroidism   New onset of congestive heart failure (HCC)   History of atrial fibrillation   ARF (acute renal failure) (HCC)   Discharge Condition: stable  Diet recommendation: heart healthy  Filed Weights   05/11/17 1714 05/13/17 0500  Weight: 84.3 kg (185 lb 12.8 oz) 86.8 kg (191 lb 5.8 oz)    History of present illness:  Per HPI Donna Booker is Donna Booker 72 y.o. female with history of Graves' disease diabetes mellitus hypertension who was recently admitted for new onset atrial fibrillation and CHF had Reyn Faivre routine visit with her endocrinologist and had [name] routine labs done which showed hyperkalemia with acute renal failure.  Patient was instructed to come to the ER.  Patient otherwise was asymptomatic denies any nausea vomiting diarrhea chest pain or shortness of breath fever or chills.  Patient was recently admitted for CHF and [name]. fib and was  started on Lasix and lisinopril.  Denies taking NSAIDs.  Her hyperkalemia was thought to be 2/2 AKI in setting of diuresis with lasix in addition to lisinopril and potassium supplementation.  She improved with kayexalate, IVF, and holding ACE and loop diuretic and k supplementation.  She was started on heparin gtt for her afib in setting of holding her eliquis with AKI.  She developed sinus tach with SVT and was briefly placed on dilt gtt.  Her rate is now well controlled on home atenolol.    See below for additional details.  Hospital Course:  1. Severe hyperkalemia with acute renal failure:  Suspect this is due to combination of lisinopril (started at last discharge), overdiuresis with lasix, as well as potassium supplementation.   1. Resolved.  Continue to hold lisinopril and lasix and k supplementation at discharge. 2. S/p calcium chloride in ED as well as insulin, D50, bicarb, and albuterol as well as kayexalate x1.  3. Repeated insulin, d50, bicarb this AM with improved but still elevated K with peaked T waves.   4. S/p kayexalate x 3  5. Follow renal US - minimally complicated renal cyst.  No hydro.  Follow up renal cysts outpatient.  Cardiology rec considering w/u for RAS as outpatient.  6. IVF for hydration  2. Atrial fibrillation  Sinus Tach  SVT: in sinus rhythm at presentation, but developed sinus tachy with runs of SVT on 5/10 PM.  Placed on dilt gtt and now back in sinus rhythm. 1. Resume eliquis with improved renal function 2. D/c dilt gtt, continue atenolol 50 mg BID at d/c  3. Recently diagnosed diastolic CHF - last echo with elevated PASP.  Unable to eval diastolic dysfunction.  Currently holding lasix.  Appears relatively euvolemic. 1. Holding lasix, continue to follow resp status at d/c  4. Hypothyroidism on Tapazole.  Follow up outpatient.  5. Diabetes mellitus type 2- d/c on home regimen.   6. Hypertension- Stop lisinopril with above.Continue  atenolol.  7. Elevated Liver Enzymes: follow acute hepatitis panel (negative).  Trend.  Consider RUQ [place].  Follow outpatient.   8. Fever  Leukocytosis:  Follow blood cx, CXR, urinalysis.  No clear si/sx of infection.  Currently pt asymptomatic.   9. Hypomagnesemia: replete, follow  10. NAGMA: mild, follow with IVF  Procedures:  none  Consultations:  cardiology  Discharge Exam: Vitals:   05/13/17 0550 05/13/17 0810  BP:    Pulse:    Resp:    Temp: 99.1 F (37.3 C) 98.7 F (37.1 C)  SpO2:     Feeling better.  Ready to go home.  General: No acute distress. Cardiovascular: Heart sounds show Dmario Russom regular rate, and rhythm. No gallops or rubs. No murmurs. No JVD. Lungs: Clear to auscultation bilaterally with good air movement. No rales, rhonchi or wheezes. Abdomen: Soft, nontender, nondistended with normal active bowel sounds. No masses. No hepatosplenomegaly. Neurological: Alert and oriented 3. Moves all extremities 4. Cranial nerves II through XII grossly intact. Skin: Warm and dry. No rashes or lesions. Extremities: No clubbing or cyanosis. No edema. Pedal pulses 2+. Psychiatric: Mood and affect are normal. Insight and judgment are appropriate.  Discharge Instructions   Discharge Instructions    Call MD for:  difficulty breathing, headache or visual disturbances   Complete by:  As directed    Call MD for:  extreme fatigue   Complete by:  As directed    Call MD for:  persistant dizziness or light-headedness   Complete by:  As directed    Call MD for:  persistant nausea and vomiting   Complete by:  As directed    Call MD for:  redness, tenderness, or signs of infection (pain, swelling, redness, odor or green/yellow discharge around incision site)   Complete by:  As directed    Call MD for:  severe uncontrolled pain   Complete by:  As directed    Call MD for:  temperature >100.4   Complete by:  As directed    Diet - low sodium heart healthy   Complete by:  As  directed    Discharge instructions   Complete by:  As directed    You were seen for hyperkalemia (high levels of potassium).  This was because of kidney injury caused by lasix and lisinopril with the addition of supplemental potassium.    Please stop your lasix, lisinopril, and potassium.  Your blood pressure regimen will probably need to be adjusted as an outpatient.   Your potassium has now improved.  Your renal US had evidence of cysts, please follow up with your PCP.  You'll need follow up imaging for this.  You had Tenesia Escudero fever in the hospital, I'm not sure why.  We ordered blood cultures.  Your PCP should follow these results.  Please follow up if you have recurrent fevers.  Your liver enzymes were mildly elevated in the hospital.  Please follow up with your PCP regarding this as well.   Please follow up with Dr. Rosemary Holms.  Return if you have new, recurrent, or worsening symptoms.  Please ask your PCP to request records from  this hospitalization so they know what was done and what the next steps will be.   Increase activity slowly   Complete by:  As directed      Allergies as of 05/13/2017      Reactions   Tramadol Nausea And Vomiting      Medication List    STOP taking these medications   furosemide 40 MG tablet Commonly known as:  LASIX   lisinopril 5 MG tablet Commonly known as:  PRINIVIL,ZESTRIL   potassium chloride SA 20 MEQ tablet Commonly known as:  K-DUR,KLOR-CON     TAKE these medications   apixaban 5 MG Tabs tablet Commonly known as:  ELIQUIS Take 1 tablet (5 mg total) by mouth 2 (two) times daily.   aspirin EC 81 MG tablet Take 81 mg by mouth daily.   atenolol 50 MG tablet Commonly known as:  TENORMIN Take 1 tablet (50 mg total) by mouth 2 (two) times daily.   diclofenac sodium 1 % Gel Commonly known as:  VOLTAREN Apply 2 g topically 4 (four) times daily as needed (pain).   esomeprazole 40 MG capsule Commonly known as:  NEXIUM Take 40 mg by  mouth daily before breakfast.   fluticasone furoate-vilanterol 200-25 MCG/INH Aepb Commonly known as:  BREO ELLIPTA Inhale 1 puff into the lungs daily.   gabapentin 300 MG capsule Commonly known as:  NEURONTIN Take 300 mg by mouth 3 (three) times daily.   glipiZIDE 5 MG tablet Commonly known as:  GLUCOTROL Take 1 tablet (5 mg total) by mouth 2 (two) times daily before Kymia Simi meal. NEED ANNUAL VISIT FOR REFILLS   insulin detemir 100 UNIT/ML injection Commonly known as:  LEVEMIR Inject 0.25 mLs (25 Units total) into the skin daily at 10 pm.   loratadine 10 MG tablet Commonly known as:  CLARITIN Take 1 tablet (10 mg total) by mouth at bedtime.   methimazole 5 MG tablet Commonly known as:  TAPAZOLE Take 2 tablets (10 mg total) by mouth 2 (two) times daily. What changed:  how much to take   pravastatin 40 MG tablet Commonly known as:  PRAVACHOL Take 1 tablet (40 mg total) by mouth daily at 6 PM.      Allergies  Allergen Reactions  . Tramadol Nausea And Vomiting   Follow-up Information    Myrlene Broker, MD.   Specialty:  Internal Medicine Contact information: [address] [place] [address] [phone]        Elder Negus, MD Follow up.   Specialty:  Cardiology Contact information: [address] [place] [address] [phone]        Carlus Pavlov, MD Follow up.   Specialty:  Internal Medicine Contact information: [address] [place] [address] [phone]            The results of significant diagnostics from this hospitalization (including imaging, microbiology, ancillary and laboratory) are listed below for reference.    Significant Diagnostic Studies: Dg Chest 2 View  Result Date: 05/12/2017 CLINICAL DATA:  Fever EXAM: CHEST - 2 VIEW COMPARISON:  04/20/2017; 03/04/2017; chest CT-04/21/2017 FINDINGS: Grossly unchanged borderline enlarged cardiac silhouette and mediastinal contours.  Resolved right-sided pleural effusion. No evidence of edema. No focal airspace opacities. No pleural effusion or pneumothorax. No acute osseus abnormalities. Post lower cervical ACDF, incompletely evaluated. IMPRESSION: No acute cardiopulmonary disease. Specifically, no evidence of pneumonia. Electronically Signed   By: Simonne Come M.D.   On: 05/12/2017 12:56  Dg Chest 2 View  Result Date: 04/21/2017 CLINICAL DATA:  Acute onset of shortness of breath. EXAM: CHEST - 2 VIEW COMPARISON:  Chest radiograph performed 04/18/2017 FINDINGS: The lungs are well-aerated. There is mild elevation of the right hemidiaphragm. Increased interstitial markings may reflect mild interstitial edema. Trevonn Hallum small right pleural effusion is noted. There is no evidence of pneumothorax. The heart is normal in size; the mediastinal contour is within normal limits. No acute osseous abnormalities are seen. Cervical spinal fusion hardware is noted. IMPRESSION: Mild elevation of the right hemidiaphragm. Increased interstitial markings may reflect mild interstitial edema. Small right pleural effusion noted. Electronically Signed   By: Roanna Raider M.D.   On: 04/21/2017 00:03   Ct Angio Chest Pe W/cm &/or Wo Cm  Result Date: 04/21/2017 CLINICAL DATA:  SOB, c/o respiratory distress. Pt [facility] this mornign after spending "Gaylon Melchor few days" on the floor. Hx Yasir Kitner-fib. EXAM: CT ANGIOGRAPHY CHEST WITH CONTRAST TECHNIQUE: Multidetector CT imaging of the chest was performed using the standard protocol during bolus administration of intravenous contrast. Multiplanar CT image reconstructions and MIPs were obtained to evaluate the vascular anatomy. CONTRAST:  ISOVUE-370 IOPAMIDOL (ISOVUE-370) INJECTION 76% COMPARISON:  03/04/2017 FINDINGS: Cardiovascular: Satisfactory opacification of pulmonary arteries noted, and there is no evidence of pulmonary emboli. Scattered mild coronary calcifications. Adequate contrast opacification of the  thoracic aorta with no evidence of dissection, aneurysm, or stenosis. There is classic 3-vessel brachiocephalic arch anatomy without proximal stenosis. Minimal Aortic Atherosclerosis (ICD10-170.0) calcified plaque in the distal arch and descending segment. Mediastinum/Nodes: No pericardial effusion. No hilar or mediastinal adenopathy. Thyromegaly. Lungs/Pleura: Moderate right and trace left pleural effusions. No pneumothorax. Loculated fluid in the minor and right major fissures resulting in pseudotumor. No pulmonary nodule. Dependent atelectasis posteriorly in the right lower lobe. Upper Abdomen: No acute findings Musculoskeletal: Anterior vertebral endplate spurring at multiple levels in the mid and lower thoracic spine. Negative for fracture or worrisome bone lesion. Review of the MIP images confirms the above findings. IMPRESSION: 1. Negative for acute PE or thoracic aortic dissection. 2. Aortic Atherosclerosis (ICD10-170.0) and coronary artery disease. Please note that although the presence of coronary artery calcium documents the presence of coronary artery disease, the severity of this disease and any potential stenosis cannot be assessed on this non-gated CT examination. Assessment for potential risk factor modification, dietary therapy or pharmacologic therapy may be warranted, if clinically indicated. 3. Pleural effusions right greater than left, new since previous. Electronically Signed   By: Corlis Leak M.D.   On: 04/21/2017 13:08   US Renal  Result Date: 05/11/2017 CLINICAL DATA:  Acute kidney injury. EXAM: RENAL / URINARY TRACT ULTRASOUND COMPLETE COMPARISON:  CT abdomen pelvis dated May 04, 2012. FINDINGS: Right Kidney: Length: 10.1 cm. Echogenicity within normal limits. No mass or hydronephrosis visualized. Two simple appearing cysts measuring up to 1.8 cm are noted. Left Kidney: Length: 11.9 cm. Echogenicity within normal limits. No mass or hydronephrosis visualized. Multiple cysts are noted,  measuring up to 2.6 cm. Several of these appear minimally complicated, containing either heterogeneous debris or Ferguson Gertner thin septation. No internal vascularity in any lesion. Bladder: Appears normal for degree of bladder distention. IMPRESSION: 1. No acute abnormality. 2. Bilateral renal cysts. Several left renal cysts are minimally complicated. Electronically Signed   By: Obie Dredge M.D.   On: 05/11/2017 10:43   Dg Chest Port 1 View  Result Date: 04/18/2017 CLINICAL DATA:  Shortness of breath, pulmonary edema. EXAM: PORTABLE CHEST 1  VIEW COMPARISON:  Radiograph of April 15, 2017. FINDINGS: Stable cardiomediastinal silhouette. No pneumothorax is noted. Atherosclerosis of thoracic aorta is noted. Increased bibasilar opacities, right greater than left, are noted consistent with pulmonary edema or atelectasis. No significant pleural effusion is noted. Bony thorax is unremarkable. IMPRESSION: Increased bibasilar opacities, right greater than left, consistent with worsening pulmonary edema or atelectasis. Aortic Atherosclerosis (ICD10-I70.0). Electronically Signed   By: Lupita Raider, M.D.   On: 04/18/2017 08:37   Dg Chest Port 1 View  Result Date: 04/15/2017 CLINICAL DATA:  Shortness of breath.  Productive cough. EXAM: PORTABLE CHEST 1 VIEW COMPARISON:  Radiographs and CT 03/04/2017 FINDINGS: Lower lung volumes from prior exam with bronchovascular crowding. Upper normal heart size with mild aortic atherosclerosis. Trace fluid in the right minor fissure. Minimal right infrahilar atelectasis. No confluent consolidation, pleural effusion or pneumothorax. No acute osseous abnormalities. IMPRESSION: Lower lung volumes from prior exam with bronchovascular crowding. Upper normal heart size with aortic atherosclerosis. Electronically Signed   By: Rubye Oaks M.D.   On: 04/15/2017 00:58   [place] Ekg Site Rite  Result Date: 04/21/2017 If Site Rite image not attached, placement could not be confirmed due to current  cardiac rhythm.   Microbiology: Recent Results (from the past 240 hour(s))  Culture, blood (routine x 2)     Status: None (Preliminary result)   Collection Time: 05/11/17  9:33 PM  Result Value Ref Range Status   Specimen Description BLOOD LEFT FOREARM  Final   Special Requests   Final    BOTTLES DRAWN AEROBIC AND ANAEROBIC Blood Culture adequate volume   Culture   Final    NO GROWTH 2 DAYS Performed at [facility], [address] [place], [place] [address]    Report Status PENDING  Incomplete  Culture, blood (routine x 2)     Status: None (Preliminary result)   Collection Time: 05/11/17  9:43 PM  Result Value Ref Range Status   Specimen Description BLOOD LEFT HAND  Final   Special Requests   Final    BOTTLES DRAWN AEROBIC ONLY Blood Culture adequate volume   Culture   Final    NO GROWTH 2 DAYS Performed at [facility], [address], [place], [place] [address]    Report Status PENDING  Incomplete     Labs: Basic Metabolic Panel: Recent Labs  Lab 05/11/17 1931 05/11/17 2330 05/12/17 0427 05/12/17 1108 05/13/17 0357  NA 141 140 141 140 142  K 5.4* 5.3* 5.5* 4.7 3.8  CL 107 109 109 108 109  CO2 23 22 21* 23 25  GLUCOSE 142* 170* 109* 246* 123*  BUN 38* 37* 35* 32* 23*  CREATININE 1.73* 1.83* 1.72* 1.62* 1.33*  CALCIUM 9.6 8.9 8.7* 8.5* 8.9  MG 1.4*  --  1.4*  --  1.5*   Liver Function Tests: Recent Labs  Lab 05/12/17 0427 05/13/17 0357  AST 118* 58*  ALT 73* 62*  ALKPHOS 88 81  BILITOT 0.9 0.9  PROT 6.2* 6.0*  ALBUMIN 2.9* 2.6*   No results for input(s): LIPASE, AMYLASE in the last 168 hours. No results for input(s): AMMONIA in the last 168 hours. CBC: Recent Labs  Lab 05/10/17 2314 05/12/17 0427 05/13/17 0357 05/13/17 0841  WBC 11.7* 10.7* 12.1*  --   HGB 11.1* 10.9* 9.1* 10.0*  HCT 35.9* 36.1 30.1* 32.8*  MCV 76.5* 78.3 75.8*  --   PLT 279 221 231  --    Cardiac Enzymes:  Recent Labs  Lab 05/10/17 2314 05/11/17 0639  05/11/17 2147 05/12/17 0427 05/12/17 1108  CKTOTAL  --  35*  --   --   --   TROPONINI <0.03  --  <0.03 <0.03 <0.03   BNP: BNP (last 3 results) Recent Labs    03/04/17 0934 04/20/17 2322  BNP 216.3* 1,050.9*    ProBNP (last 3 results) No results for input(s): PROBNP in the last 8760 hours.  CBG: Recent Labs  Lab 05/12/17 0826 05/12/17 1223 05/12/17 1655 05/12/17 2201 05/13/17 0808  GLUCAP 110* 211* 208* 269* 102*       Signed:  Lacretia Nicks MD.  [facility] 05/13/2017, 3:08 PM

## 2017-05-13 NOTE — Progress Notes (Signed)
ANTICOAGULATION CONSULT NOTE - Follow Up Consult  Pharmacy Consult for Eliquis Indication: atrial fibrillation  Patient Measurements: Height: 5\' 4"  (162.6 cm) Weight: 191 lb 5.8 oz (86.8 kg) IBW/kg (Calculated) : 54.7 Heparin Dosing Weight: 63 kg  Vital Signs: Temp: 99.1 F (37.3 C) (05/12 0550) Temp Source: Oral (05/12 0550) BP: 162/58 (05/11 2001) Pulse Rate: 72 (05/12 0000)  Labs: Recent Labs    05/10/17 2314  05/11/17 0639  05/11/17 1931 05/11/17 2147  05/12/17 0427 05/12/17 0713 05/12/17 1108 05/12/17 1440 05/13/17 0357  HGB 11.1*  --   --   --   --   --   --  10.9*  --   --   --  9.1*  HCT 35.9*  --   --   --   --   --   --  36.1  --   --   --  30.1*  PLT 279  --   --   --   --   --   --  221  --   --   --  231  APTT  --   --   --    < > 52*  --   --   --  93*  --  83* 92*  HEPARINUNFRC  --   --   --   --  0.75*  --   --   --  0.63  --   --  0.43  CREATININE 2.03*   < >  --    < > 1.73*  --    < > 1.72*  --  1.62*  --  1.33*  CKTOTAL  --   --  35*  --   --   --   --   --   --   --   --   --   TROPONINI <0.03  --   --   --   --  <0.03  --  <0.03  --  <0.03  --   --    < > = values in this interval not displayed.    Estimated Creatinine Clearance: 42.5 mL/min (A) (by C-G formula based on SCr of 1.33 mg/dL (H)).  Assessment: 21 YOF with Afib to resume Eliquis Goal of Therapy:  Heparin level 0.3-0.7 units/ml aPTT 66-102 seconds Monitor platelets by anticoagulation protocol: Yes   Plan:  D/C heparin Restart Eliquis 5 mg BID  78, PharmD, BCPS  05/13/2017 7:42 AM

## 2017-05-13 NOTE — Progress Notes (Signed)
Donna Booker to be D/C'd Home per MD order.  Discussed prescriptions and follow up appointments with the patient. Prescriptions given to patient, medication list explained in detail. Pt verbalized understanding.  Allergies as of 05/13/2017      Reactions   Tramadol Nausea And Vomiting      Medication List    STOP taking these medications   furosemide 40 MG tablet Commonly known as:  LASIX   lisinopril 5 MG tablet Commonly known as:  PRINIVIL,ZESTRIL   potassium chloride SA 20 MEQ tablet Commonly known as:  K-DUR,KLOR-CON     TAKE these medications   apixaban 5 MG Tabs tablet Commonly known as:  ELIQUIS Take 1 tablet (5 mg total) by mouth 2 (two) times daily.   aspirin EC 81 MG tablet Take 81 mg by mouth daily.   atenolol 50 MG tablet Commonly known as:  TENORMIN Take 1 tablet (50 mg total) by mouth 2 (two) times daily.   diclofenac sodium 1 % Gel Commonly known as:  VOLTAREN Apply 2 g topically 4 (four) times daily as needed (pain).   esomeprazole 40 MG capsule Commonly known as:  NEXIUM Take 40 mg by mouth daily before breakfast.   fluticasone furoate-vilanterol 200-25 MCG/INH Aepb Commonly known as:  BREO ELLIPTA Inhale 1 puff into the lungs daily.   gabapentin 300 MG capsule Commonly known as:  NEURONTIN Take 300 mg by mouth 3 (three) times daily.   glipiZIDE 5 MG tablet Commonly known as:  GLUCOTROL Take 1 tablet (5 mg total) by mouth 2 (two) times daily before a meal. NEED ANNUAL VISIT FOR REFILLS   insulin detemir 100 UNIT/ML injection Commonly known as:  LEVEMIR Inject 0.25 mLs (25 Units total) into the skin daily at 10 pm.   loratadine 10 MG tablet Commonly known as:  CLARITIN Take 1 tablet (10 mg total) by mouth at bedtime.   methimazole 5 MG tablet Commonly known as:  TAPAZOLE Take 2 tablets (10 mg total) by mouth 2 (two) times daily. What changed:  how much to take   pravastatin 40 MG tablet Commonly known as:  PRAVACHOL Take 1 tablet  (40 mg total) by mouth daily at 6 PM.       Vitals:   05/13/17 0550 05/13/17 0810  BP:    Pulse:    Resp:    Temp: 99.1 F (37.3 C) 98.7 F (37.1 C)  SpO2:      Skin clean, dry and intact without evidence of skin break down, no evidence of skin tears noted. IV catheter discontinued intact. Site without signs and symptoms of complications. Dressing and pressure applied. Pt denies pain at this time. No complaints noted.  An After Visit Summary was printed and given to the patient. Patient escorted via WC, and D/C home via private auto.  Fara Boros BSN, RN [employer] [facility] Phone [phone]

## 2017-05-13 NOTE — Discharge Instructions (Signed)

## 2017-05-14 ENCOUNTER — Telehealth: Payer: Self-pay | Admitting: *Deleted

## 2017-05-14 ENCOUNTER — Telehealth: Payer: Self-pay

## 2017-05-14 NOTE — Telephone Encounter (Signed)
Spoke to patient. Gave lab results. Pt verbalized understanding. Scheduled future lab visit.

## 2017-05-14 NOTE — Telephone Encounter (Signed)
-----   Message from Carlus Pavlov, MD sent at 05/11/2017  5:20 PM EDT ----- Tad Moore, can you please call pt:  TFTs are much better.  Due to the large decrease in her TFTs in such a short time, I would suggest to decrease her methimazole dose to 5 mg twice a day.  We will then need to repeat the tests in 4 to 5 weeks.  Let us schedule her for labs then.  Labs are in.

## 2017-05-14 NOTE — Telephone Encounter (Signed)
Called pt to verify hosp f/u appt that was made for tomorrow w/Dr. Okey Dupre. Pt states she made appt this morning.  Inform pt had some additional questions concerning discharge completed TCM call below.Raechel Chute  Transition Care Management Follow-up Telephone Call   Date discharged? 05/13/17   How have you been since you were released from the hospital? Pt states she is feeling alright   Do you understand why you were in the hospital? YES   Do you understand the discharge instructions? YES   Where were you discharged to? Home   Items Reviewed:  Medications reviewed: YES  Allergies reviewed: YES  Dietary changes reviewed: YES, diabetic and carb modified  Referrals reviewed: No referral needed   Functional Questionnaire:   Activities of Daily Living (ADLs):   She states she are independent in the following: ambulation, bathing and hygiene, feeding, continence, grooming, toileting and dressing States she doesn't require assistance    Any transportation issues/concerns?: NO   Any patient concerns? NO   Confirmed importance and date/time of follow-up visits scheduled YES, appt 05/15/17  Provider Appointment booked with Dr. Okey Dupre  Confirmed with patient if condition begins to worsen call PCP or go to the ER.  Patient was given the office number and encouraged to call back with question or concerns.  : YES

## 2017-05-15 ENCOUNTER — Encounter: Payer: Self-pay | Admitting: Internal Medicine

## 2017-05-15 ENCOUNTER — Ambulatory Visit: Payer: BLUE CROSS/BLUE SHIELD | Admitting: Internal Medicine

## 2017-05-15 VITALS — BP 160/70 | HR 64 | Temp 97.7°F | Ht 64.0 in | Wt 189.0 lb

## 2017-05-15 DIAGNOSIS — E875 Hyperkalemia: Secondary | ICD-10-CM

## 2017-05-15 DIAGNOSIS — Z8639 Personal history of other endocrine, nutritional and metabolic disease: Secondary | ICD-10-CM | POA: Diagnosis not present

## 2017-05-15 DIAGNOSIS — N179 Acute kidney failure, unspecified: Secondary | ICD-10-CM

## 2017-05-15 DIAGNOSIS — M10372 Gout due to renal impairment, left ankle and foot: Secondary | ICD-10-CM

## 2017-05-15 DIAGNOSIS — I10 Essential (primary) hypertension: Secondary | ICD-10-CM

## 2017-05-15 DIAGNOSIS — M109 Gout, unspecified: Secondary | ICD-10-CM | POA: Insufficient documentation

## 2017-05-15 MED ORDER — PREDNISONE 20 MG PO TABS: 40.0000 mg | ORAL_TABLET | Freq: Every day | ORAL | 0 refills | Status: DC

## 2017-05-15 NOTE — Assessment & Plan Note (Signed)
Resolved at discharge and will get rechecked with cardiology tomorrow. It is concerning that she was only on lisinopril 5 mg daily and potassium supplement with lasix and had K up to 7. It is also unclear what caused the renal injury perhaps overdiuresis.

## 2017-05-15 NOTE — Assessment & Plan Note (Signed)
Getting labs tomorrow from cardiology and was back to baseline prior to discharge from the hospital. It is still unclear what precipitated this episode.

## 2017-05-15 NOTE — Patient Instructions (Signed)
We will follow up on the labs from tomorrow to make sure everything is okay.   We will see you back in about 1 month to make sure you are doing well.   Let us know if you decide that the physical therapy at home would be helpful and we can get that set up.   I hope your son gets better.

## 2017-05-15 NOTE — Assessment & Plan Note (Signed)
Likely due to AKI episode and diuresis. She is given rx prednisone to take for 5 days to help flare.

## 2017-05-15 NOTE — Progress Notes (Signed)
   Subjective:    Patient ID: Donna Booker, female    DOB: [birth year], 73 y.o.   MRN: [id number]  HPI The patient is a 72 YO female coming in for hospital follow up (in for hyperkalemia to 7 and AKI with elevated liver enzymes, given calcium gluconate and kayexylate and diuresed, was taking some potassium supplements and these were stopped and lasix stopped). She had been feeling tired before the hospital due to many recent hospitalizations. She was supposed to start PT and then was told to go back to work so she did. Then she was told not to work and go to the hospital. She did not get PT when being sent home this time and is too busy for that. Her son is in critical health in the hospital currently which is very upsetting to her. She denies chest pains. She denies SOB worsening. She is fatigued still. Getting labs tomorrow with cardiology. Is also having new gout flare in the left great toe. Started yesterday and is very painful. Has had gout in the past. Denies diarrhea or constipation.  PMH, [facility], social history reviewed and updated.   Review of Systems  Constitutional: Positive for activity change, appetite change and fatigue. Negative for chills, fever and unexpected weight change.  HENT: Negative.   Eyes: Negative.   Respiratory: Positive for cough and shortness of breath. Negative for chest tightness.   Cardiovascular: Negative for chest pain, palpitations and leg swelling.  Gastrointestinal: Negative for abdominal distention, abdominal pain, constipation, diarrhea, nausea and vomiting.  Musculoskeletal: Negative.   Skin: Negative.   Neurological: Negative.   Psychiatric/Behavioral: Negative.       Objective:   Physical Exam  Constitutional: She is oriented to person, place, and time. She appears well-developed and well-nourished.  HENT:  Head: Normocephalic and atraumatic.  Eyes: EOM are normal.  Neck: Normal range of motion.  Cardiovascular: Normal rate and regular rhythm.    Pulmonary/Chest: Effort normal and breath sounds normal. No respiratory distress. She has no wheezes. She has no rales.  Abdominal: Soft. Bowel sounds are normal. She exhibits no distension. There is no tenderness. There is no rebound.  Musculoskeletal: She exhibits no edema.  Neurological: She is alert and oriented to person, place, and time. Coordination normal.  Skin: Skin is warm and dry.  Left great toe is red and swollen and exquisitely tender.   Psychiatric: She has a normal mood and affect.   Vitals:   05/15/17 1411  BP: (!) 160/70  Pulse: 64  Temp: 97.7 F (36.5 C)  TempSrc: Oral  SpO2: 98%  Weight: 189 lb (85.7 kg)  Height: 5\' 4"  (1.626 m)      Assessment & Plan:

## 2017-05-15 NOTE — Assessment & Plan Note (Signed)
BP slightly above goal today on her atenolol. She had just received bad news about her son's health so will monitor at cardiology on Friday. If still elevated needs additional agent perhaps calcium channel blocker. Given gout should not be on hctz.

## 2017-05-16 LAB — CULTURE, BLOOD (ROUTINE X 2)
Culture: NO GROWTH
Culture: NO GROWTH
Special Requests: ADEQUATE
Special Requests: ADEQUATE

## 2017-05-16 NOTE — ED Provider Notes (Signed)
[place] CARE Provider Note   CSN: [id number] Arrival date & time: 05/10/17  2249     History   Chief Complaint Chief Complaint  Patient presents with  . Sent by doctor    HPI Donna Booker is a 72 y.o. female.  HPI 72 year old female sent to the emergency department for elevated potassium level which was found by her endocrinologist yesterday on routine labs.  Patient denies weakness.  She denies fevers or chills.  Denies nausea vomiting or diarrhea.  She states she overall feels well.  No new medications.  No significant dietary changes.  No palpitations or syncope.  Continues to urinate without difficulty   Past Medical History:  Diagnosis Date  . Diabetes mellitus   . Graves disease   . Hypertension   . Hyperthyroidism     Patient Active Problem List   Diagnosis Date Noted  . Acute gout 05/15/2017  . Hyperkalemia 05/11/2017  . ARF (acute renal failure) (HCC) 05/11/2017  . New onset of congestive heart failure (HCC) 04/21/2017  . Acute on chronic respiratory failure with hypoxia (HCC) 04/21/2017  . Leukocytosis 04/21/2017  . History of atrial fibrillation   . Cough 04/20/2017  . Hyperthyroidism 04/18/2017  . Atrial fibrillation with RVR (HCC) 04/15/2017  . Routine general medical examination at a health care facility 10/20/2015  . Uncontrolled type 2 diabetes mellitus with diabetic polyneuropathy, with long-term current use of insulin (HCC) 02/25/2015  . Right foot pain 01/28/2015  . Hyperlipidemia 09/02/2014  . Morbid obesity (HCC) 09/02/2014  . HTN (hypertension) 10/04/2012    Past Surgical History:  Procedure Laterality Date  . BREAST SURGERY       OB History   None      Home Medications    Prior to Admission medications   Medication Sig Start Date End Date Taking? Authorizing Provider  apixaban (ELIQUIS) 5 MG TABS tablet Take 1 tablet (5 mg total) by mouth 2 (two) times daily. 04/20/17  Yes Calvert Cantor, MD  aspirin EC 81  MG tablet Take 81 mg by mouth daily.   Yes [provider]  diclofenac sodium (VOLTAREN) 1 % GEL Apply 2 g topically 4 (four) times daily as needed (pain). 04/24/17  Yes Alwyn Ren, MD  esomeprazole (NEXIUM) 40 MG capsule Take 40 mg by mouth daily before breakfast.   Yes [provider]  fluticasone furoate-vilanterol (BREO ELLIPTA) 200-25 MCG/INH AEPB Inhale 1 puff into the lungs daily. 04/21/17  Yes Calvert Cantor, MD  gabapentin (NEURONTIN) 300 MG capsule Take 300 mg by mouth 3 (three) times daily. 02/22/17  Yes [provider]  glipiZIDE (GLUCOTROL) 5 MG tablet Take 1 tablet (5 mg total) by mouth 2 (two) times daily before a meal. NEED ANNUAL VISIT FOR REFILLS 12/13/16  Yes Myrlene Broker, MD  insulin detemir (LEVEMIR) 100 UNIT/ML injection Inject 0.25 mLs (25 Units total) into the skin daily at 10 pm. 04/24/17  Yes Alwyn Ren, MD  loratadine (CLARITIN) 10 MG tablet Take 1 tablet (10 mg total) by mouth at bedtime. 04/24/17  Yes Alwyn Ren, MD  methimazole (TAPAZOLE) 5 MG tablet Take 2 tablets (10 mg total) by mouth 2 (two) times daily. Patient taking differently: Take 5 mg by mouth 2 (two) times daily.  05/10/17  Yes Carlus Pavlov, MD  pravastatin (PRAVACHOL) 40 MG tablet Take 1 tablet (40 mg total) by mouth daily at 6 PM. 04/24/17  Yes Alwyn Ren, MD  atenolol (TENORMIN) 50  MG tablet Take 1 tablet (50 mg total) by mouth 2 (two) times daily. 05/13/17 06/12/17  Zigmund Daniel., MD  predniSONE (DELTASONE) 20 MG tablet Take 2 tablets (40 mg total) by mouth daily with breakfast. 05/15/17   Myrlene Broker, MD    Family History Family History  Problem Relation Age of Onset  . Diabetes Mellitus II Mother   . Diabetes Mellitus II Father   . Hypertension Father   . Hyperlipidemia Father     Social History Social History   Tobacco Use  . Smoking status: Never Smoker  . Smokeless tobacco: Never Used  Substance Use  Topics  . Alcohol use: No  . Drug use: No     Allergies   Tramadol   Review of Systems Review of Systems  All other systems reviewed and are negative.    Physical Exam Updated Vital Signs BP (!) 162/58 (BP Location: Left Arm)   Pulse 72   Temp 98.7 F (37.1 C) (Oral)   Resp (!) 21   Ht 5\' 4"  (1.626 m)   Wt 86.8 kg (191 lb 5.8 oz)   SpO2 95%   BMI 32.85 kg/m   Physical Exam  Constitutional: She is oriented to person, place, and time. She appears well-developed and well-nourished. No distress.  HENT:  Head: Normocephalic and atraumatic.  Eyes: EOM are normal.  Neck: Normal range of motion.  Cardiovascular: Normal rate, regular rhythm and normal heart sounds.  Pulmonary/Chest: Effort normal and breath sounds normal.  Abdominal: Soft. She exhibits no distension. There is no tenderness.  Musculoskeletal: Normal range of motion.  Neurological: She is alert and oriented to person, place, and time.  Skin: Skin is warm and dry.  Psychiatric: She has a normal mood and affect. Judgment normal.  Nursing note and vitals reviewed.    ED Treatments / Results  Labs (all labs ordered are listed, but only abnormal results are displayed) Labs Reviewed  CBC - Abnormal; Notable for the following components:      Result Value   WBC 11.7 (*)    Hemoglobin 11.1 (*)    HCT 35.9 (*)    MCV 76.5 (*)    MCH 23.7 (*)    All other components within normal limits  BASIC METABOLIC PANEL - Abnormal; Notable for the following components:   Sodium 134 (*)    Potassium >7.5 (*)    CO2 20 (*)    Glucose, Bld 207 (*)    BUN 52 (*)    Creatinine, Ser 2.03 (*)    GFR calc non Af Amer 24 (*)    GFR calc Af Amer 28 (*)    All other components within normal limits  URINALYSIS, ROUTINE W REFLEX MICROSCOPIC - Abnormal; Notable for the following components:   Color, Urine STRAW (*)    Glucose, UA 50 (*)    All other components within normal limits  HEPARIN LEVEL (UNFRACTIONATED) - Abnormal;  Notable for the following components:   Heparin Unfractionated 0.75 (*)    All other components within normal limits  APTT - Abnormal; Notable for the following components:   aPTT 52 (*)    All other components within normal limits  BASIC METABOLIC PANEL - Abnormal; Notable for the following components:   Potassium 6.6 (*)    Glucose, Bld 132 (*)    BUN 47 (*)    Creatinine, Ser 1.92 (*)    Calcium 10.5 (*)    GFR calc non Af Amer 26 (*)  GFR calc Af Amer 30 (*)    All other components within normal limits  CK - Abnormal; Notable for the following components:   Total CK 35 (*)    All other components within normal limits  BASIC METABOLIC PANEL - Abnormal; Notable for the following components:   Potassium 5.6 (*)    BUN 44 (*)    Creatinine, Ser 1.80 (*)    Calcium 10.5 (*)    GFR calc non Af Amer 28 (*)    GFR calc Af Amer 32 (*)    All other components within normal limits  BASIC METABOLIC PANEL - Abnormal; Notable for the following components:   Potassium 5.4 (*)    Glucose, Bld 142 (*)    BUN 38 (*)    Creatinine, Ser 1.73 (*)    GFR calc non Af Amer 29 (*)    GFR calc Af Amer 34 (*)    All other components within normal limits  BASIC METABOLIC PANEL - Abnormal; Notable for the following components:   Potassium 5.3 (*)    Glucose, Bld 170 (*)    BUN 37 (*)    Creatinine, Ser 1.83 (*)    GFR calc non Af Amer 27 (*)    GFR calc Af Amer 31 (*)    All other components within normal limits  MAGNESIUM - Abnormal; Notable for the following components:   Magnesium 1.4 (*)    All other components within normal limits  CBC - Abnormal; Notable for the following components:   WBC 10.7 (*)    Hemoglobin 10.9 (*)    MCH 23.6 (*)    RDW 16.2 (*)    All other components within normal limits  MAGNESIUM - Abnormal; Notable for the following components:   Magnesium 1.4 (*)    All other components within normal limits  GLUCOSE, CAPILLARY - Abnormal; Notable for the following  components:   Glucose-Capillary 138 (*)    All other components within normal limits  HEPATIC FUNCTION PANEL - Abnormal; Notable for the following components:   Total Protein 6.2 (*)    Albumin 2.9 (*)    AST 118 (*)    ALT 73 (*)    All other components within normal limits  BASIC METABOLIC PANEL - Abnormal; Notable for the following components:   Potassium 5.5 (*)    CO2 21 (*)    Glucose, Bld 109 (*)    BUN 35 (*)    Creatinine, Ser 1.72 (*)    Calcium 8.7 (*)    GFR calc non Af Amer 29 (*)    GFR calc Af Amer 34 (*)    All other components within normal limits  GLUCOSE, CAPILLARY - Abnormal; Notable for the following components:   Glucose-Capillary 142 (*)    All other components within normal limits  BASIC METABOLIC PANEL - Abnormal; Notable for the following components:   Glucose, Bld 246 (*)    BUN 32 (*)    Creatinine, Ser 1.62 (*)    Calcium 8.5 (*)    GFR calc non Af Amer 31 (*)    GFR calc Af Amer 36 (*)    All other components within normal limits  APTT - Abnormal; Notable for the following components:   aPTT 93 (*)    All other components within normal limits  GLUCOSE, CAPILLARY - Abnormal; Notable for the following components:   Glucose-Capillary 110 (*)    All other components within normal limits  URINALYSIS, ROUTINE  W REFLEX MICROSCOPIC - Abnormal; Notable for the following components:   APPearance HAZY (*)    Protein, ur 30 (*)    Bacteria, UA RARE (*)    All other components within normal limits  APTT - Abnormal; Notable for the following components:   aPTT 83 (*)    All other components within normal limits  GLUCOSE, CAPILLARY - Abnormal; Notable for the following components:   Glucose-Capillary 211 (*)    All other components within normal limits  GLUCOSE, CAPILLARY - Abnormal; Notable for the following components:   Glucose-Capillary 208 (*)    All other components within normal limits  MAGNESIUM - Abnormal; Notable for the following components:    Magnesium 1.5 (*)    All other components within normal limits  COMPREHENSIVE METABOLIC PANEL - Abnormal; Notable for the following components:   Glucose, Bld 123 (*)    BUN 23 (*)    Creatinine, Ser 1.33 (*)    Total Protein 6.0 (*)    Albumin 2.6 (*)    AST 58 (*)    ALT 62 (*)    GFR calc non Af Amer 40 (*)    GFR calc Af Amer 46 (*)    All other components within normal limits  CBC - Abnormal; Notable for the following components:   WBC 12.1 (*)    Hemoglobin 9.1 (*)    HCT 30.1 (*)    MCV 75.8 (*)    MCH 22.9 (*)    RDW 15.6 (*)    All other components within normal limits  APTT - Abnormal; Notable for the following components:   aPTT 92 (*)    All other components within normal limits  GLUCOSE, CAPILLARY - Abnormal; Notable for the following components:   Glucose-Capillary 269 (*)    All other components within normal limits  GLUCOSE, CAPILLARY - Abnormal; Notable for the following components:   Glucose-Capillary 102 (*)    All other components within normal limits  HEMOGLOBIN AND HEMATOCRIT, BLOOD - Abnormal; Notable for the following components:   Hemoglobin 10.0 (*)    HCT 32.8 (*)    All other components within normal limits  CBG MONITORING, ED - Abnormal; Notable for the following components:   Glucose-Capillary 114 (*)    All other components within normal limits  CULTURE, BLOOD (ROUTINE X 2)  CULTURE, BLOOD (ROUTINE X 2)  TROPONIN I  SODIUM, URINE, RANDOM  CREATININE, URINE, RANDOM  NA AND K (SODIUM & POTASSIUM), RAND UR  TROPONIN I  TROPONIN I  TROPONIN I  HEPARIN LEVEL (UNFRACTIONATED)  HEPATITIS PANEL, ACUTE  HEPARIN LEVEL (UNFRACTIONATED)  CBG MONITORING, ED    EKG EKG Interpretation  Date/Time:  Friday May 11 2017 16:46:39 EDT Ventricular Rate:  98 PR Interval:  162 QRS Duration: 85 QT Interval:  330 QTC Calculation: 422 R Axis:   19 Text Interpretation:  Sinus tachycardia Multiple premature complexes, vent & supraven Low voltage,  precordial leads Confirmed by Benjiman Core [phone]) on 05/12/2017 7:45:59 PM   Radiology No results found.  Procedures .Critical Care Performed by: Azalia Bilis, MD Authorized by: Azalia Bilis, MD     CRITICAL CARE Performed by: Azalia Bilis Total critical care time: 33 minutes Critical care time was exclusive of separately billable procedures and treating other patients. Critical care was necessary to treat or prevent imminent or life-threatening deterioration. Critical care was time spent personally by me on the following activities: development of treatment plan with patient and/or surrogate as well  as nursing, discussions with consultants, evaluation of patient's response to treatment, examination of patient, obtaining history from patient or surrogate, ordering and performing treatments and interventions, ordering and review of laboratory studies, ordering and review of radiographic studies, pulse oximetry and re-evaluation of patient's condition.    Medications Ordered in ED Medications  0.9 %  sodium chloride infusion ( Intravenous Stopped 05/12/17 1541)  calcium chloride 1 g in sodium chloride 0.9 % 100 mL IVPB (0 g Intravenous Stopped 05/11/17 0209)  albuterol (PROVENTIL) (2.5 MG/3ML) 0.083% nebulizer solution 10 mg (10 mg Nebulization Given 05/11/17 0118)  dextrose 50 % solution 50 mL (50 mLs Intravenous Given 05/11/17 0149)  sodium bicarbonate injection 50 mEq (50 mEq Intravenous Given 05/11/17 0149)  insulin aspart (novoLOG) injection 10 Units (10 Units Intravenous Given 05/11/17 0156)  sodium polystyrene (KAYEXALATE) 15 GM/60ML suspension 30 g (30 g Oral Given 05/11/17 0255)  sodium chloride 0.9 % bolus 250 mL (0 mLs Intravenous Stopped 05/11/17 0530)  dextrose 50 % solution 50 mL (50 mLs Intravenous Given 05/11/17 1132)  sodium bicarbonate 100 mEq in dextrose 5 % 1,000 mL infusion ( Intravenous Stopped 05/11/17 1706)  sodium polystyrene (KAYEXALATE) 15 GM/60ML suspension 30  g (30 g Oral Given 05/11/17 1030)  insulin aspart (novoLOG) injection 10 Units (10 Units Intravenous Given 05/11/17 1132)  sodium polystyrene (KAYEXALATE) 15 GM/60ML suspension 30 g (30 g Oral Given 05/11/17 2038)  metoprolol tartrate (LOPRESSOR) injection 2.5 mg (2.5 mg Intravenous Given 05/11/17 1802)  diltiazem (CARDIZEM) 1 mg/mL load via infusion 10 mg (10 mg Intravenous Bolus from Bag 05/11/17 1955)  morphine 2 MG/ML injection (2 mg Intravenous Given 05/11/17 2023)  magnesium sulfate IVPB 2 g 50 mL (0 g Intravenous Stopped 05/12/17 0924)  magnesium sulfate IVPB 2 g 50 mL (0 g Intravenous Stopped 05/13/17 1015)     Initial Impression / Assessment and Plan / ED Course  I have reviewed the triage vital signs and the nursing notes.  Pertinent labs & imaging results that were available during my care of the patient were reviewed by me and considered in my medical decision making (see chart for details).     Repeat blood work demonstrates hyperkalemia without QT prolongation or QRS prolongation.  Mild peaked T waves diffusely.  Treated with standard medications.  Admit to hospital.  Stayed on the monitor while in the emergency department no ectopy noted.     Final Clinical Impressions(s) / ED Diagnoses   Final diagnoses:  Acute renal failure, unspecified acute renal failure type [facility])  Hyperkalemia    ED Discharge Orders        Ordered     05/13/17 2080     05/13/17 2233     05/13/17 6122     05/13/17 4497     05/13/17 5300     05/13/17 5110     05/13/17 2111     05/13/17 7356     05/13/17 7014     05/13/17 0803     05/13/17 0803     05/13/17 1030       Azalia Bilis, MD 05/16/17 [phone]

## 2017-05-18 ENCOUNTER — Other Ambulatory Visit (HOSPITAL_COMMUNITY): Payer: Self-pay | Admitting: Cardiology

## 2017-05-18 DIAGNOSIS — I10 Essential (primary) hypertension: Secondary | ICD-10-CM

## 2017-05-31 ENCOUNTER — Inpatient Hospital Stay: Admission: RE | Admit: 2017-05-31 | Payer: BLUE CROSS/BLUE SHIELD | Source: Ambulatory Visit

## 2017-06-03 ENCOUNTER — Other Ambulatory Visit: Payer: Self-pay | Admitting: Internal Medicine

## 2017-06-12 ENCOUNTER — Other Ambulatory Visit: Payer: Self-pay

## 2017-06-12 NOTE — Telephone Encounter (Signed)
Copied from CRM [id number]. Topic: General - Other >> Jun 12, 2017  2:07 PM Elliot Gault wrote: Relation to pt: self  Call back number:[phone] Pharmacy: [id number] - [place], [place] - [address] [phone] (Phone) [phone] (Fax)   Reason for call:  Patient requesting 90 day supply gabapentin (NEURONTIN) 300 MG capsule, patient informed please allow 72 hour turn around time, please advise >> Jun 12, 2017  2:09 PM Elliot Gault wrote: Relation to pt: self  Call back number:[phone] Pharmacy: [id number] - [place], [place] - [address] [phone] (Phone) [phone] (Fax)   Reason for call:  Patient requesting 90 day supply gabapentin (NEURONTIN) 300 MG capsule, patient informed please allow 72 hour turn around time, please advise

## 2017-06-12 NOTE — Telephone Encounter (Signed)
LOV 05/15/2017

## 2017-06-13 MED ORDER — GABAPENTIN 300 MG PO CAPS
300.0000 mg | ORAL_CAPSULE | Freq: Three times a day (TID) | ORAL | 1 refills | Status: DC
Start: 2017-06-13 — End: 2017-12-20

## 2017-06-18 ENCOUNTER — Ambulatory Visit: Payer: BLUE CROSS/BLUE SHIELD | Admitting: Internal Medicine

## 2017-06-20 ENCOUNTER — Other Ambulatory Visit: Payer: BLUE CROSS/BLUE SHIELD

## 2017-07-06 ENCOUNTER — Encounter: Payer: Self-pay | Admitting: Internal Medicine

## 2017-07-06 ENCOUNTER — Ambulatory Visit: Payer: BLUE CROSS/BLUE SHIELD | Admitting: Internal Medicine

## 2017-07-06 ENCOUNTER — Other Ambulatory Visit (INDEPENDENT_AMBULATORY_CARE_PROVIDER_SITE_OTHER): Payer: BLUE CROSS/BLUE SHIELD

## 2017-07-06 ENCOUNTER — Other Ambulatory Visit: Payer: Self-pay | Admitting: Internal Medicine

## 2017-07-06 VITALS — BP 130/70 | HR 63 | Temp 98.2°F | Ht 64.0 in | Wt 198.0 lb

## 2017-07-06 DIAGNOSIS — M791 Myalgia, unspecified site: Secondary | ICD-10-CM | POA: Diagnosis not present

## 2017-07-06 DIAGNOSIS — E875 Hyperkalemia: Secondary | ICD-10-CM

## 2017-07-06 LAB — COMPREHENSIVE METABOLIC PANEL
ALBUMIN: 4.1 g/dL (ref 3.5–5.2)
ALT: 14 U/L (ref 0–35)
AST: 15 U/L (ref 0–37)
Alkaline Phosphatase: 119 U/L — ABNORMAL HIGH (ref 39–117)
BILIRUBIN TOTAL: 0.2 mg/dL (ref 0.2–1.2)
BUN: 25 mg/dL — ABNORMAL HIGH (ref 6–23)
CO2: 18 mEq/L — ABNORMAL LOW (ref 19–32)
CREATININE: 1.09 mg/dL (ref 0.40–1.20)
Calcium: 9.5 mg/dL (ref 8.4–10.5)
Chloride: 102 mEq/L (ref 96–112)
GFR: 63.82 mL/min (ref >60.00)
Glucose, Bld: 396 mg/dL — ABNORMAL HIGH (ref 70–99)
Potassium: 5.7 mEq/L — ABNORMAL HIGH (ref 3.5–5.1)
Sodium: 132 mEq/L — ABNORMAL LOW (ref 135–145)
Total Protein: 7.4 g/dL (ref 6.0–8.3)

## 2017-07-06 LAB — CBC
HCT: 38 % (ref 36.0–46.0)
Hemoglobin: 11.9 g/dL — ABNORMAL LOW (ref 12.0–15.0)
MCHC: 31.4 g/dL (ref 30.0–36.0)
MCV: 76.6 fl — AB (ref 78.0–100.0)
PLATELETS: 308 10*3/uL (ref 150.0–400.0)
RBC: 4.96 Mil/uL (ref 3.87–5.11)
RDW: 18.1 % — ABNORMAL HIGH (ref 11.5–15.5)
WBC: 10.5 10*3/uL (ref 4.0–10.5)

## 2017-07-06 LAB — FERRITIN: Ferritin: 184.3 ng/mL (ref 10.0–291.0)

## 2017-07-06 LAB — T4, FREE: FREE T4: 0.72 ng/dL (ref 0.60–1.60)

## 2017-07-06 LAB — SEDIMENTATION RATE: Sed Rate: 101 mm/hr — ABNORMAL HIGH (ref 0–30)

## 2017-07-06 LAB — CK: Total CK: 50 U/L (ref 7–177)

## 2017-07-06 LAB — TSH

## 2017-07-06 MED ORDER — BACLOFEN 10 MG PO TABS
10.0000 mg | ORAL_TABLET | Freq: Three times a day (TID) | ORAL | 0 refills | Status: DC
Start: 2017-07-06 — End: 2017-07-29

## 2017-07-06 NOTE — Patient Instructions (Signed)
We will check the blood work today and call you back about the results.   We have sent in a muscle relaxer to try to help the pain called baclofen which you can take up to 3 times per day as needed. It can make some people drowsy so take it when you are at home in case it affects you.

## 2017-07-06 NOTE — Assessment & Plan Note (Signed)
Concern given recent hyperkalemia and ARF. Checking CMP, CBC, ANA, RF, CK, ferritin, thyroid. Rx for baclofen for pain to see if this helps. Given the abdominal pain if we do not find anything and if this does not help she may need further evaluation.

## 2017-07-06 NOTE — Progress Notes (Signed)
   Subjective:    Patient ID: Donna Booker, female    DOB: 06/03/1945, 72 y.o.   MRN: [id number]  HPI The patient is a 72 YO female coming in for main concern of muscle pains. She is having pain in her neck which radiates into her left head. She is also having pain in the low stomach and around into the low back. Most of the pain is with movement. She is having stomach pain when moving or standing up. She denies injury or overuse. She is very emotionally upset as her son recently died and she has recently had the funeral. She is taking tylenol for this which is not helping. She denies fevers or chills. No rash that she has noticed. No signs of urinary infection.   Review of Systems  Constitutional: Positive for activity change. Negative for appetite change, chills, fatigue, fever and unexpected weight change.  Respiratory: Negative.   Cardiovascular: Negative.   Gastrointestinal: Negative.   Musculoskeletal: Positive for arthralgias, back pain, myalgias, neck pain and neck stiffness. Negative for gait problem and joint swelling.  Skin: Negative.   Neurological: Negative.   Psychiatric/Behavioral: Positive for decreased concentration, dysphoric mood and sleep disturbance.      Objective:   Physical Exam  Constitutional: She is oriented to person, place, and time. She appears well-developed and well-nourished.  HENT:  Head: Normocephalic and atraumatic.  Eyes: EOM are normal.  Neck: Normal range of motion.  Cardiovascular: Normal rate and regular rhythm.  Pulmonary/Chest: Effort normal and breath sounds normal. No respiratory distress. She has no wheezes. She has no rales.  Abdominal: Soft. Bowel sounds are normal. She exhibits no distension. There is no tenderness. There is no rebound.  No tenderness on exam, pain with twisting  Musculoskeletal: She exhibits no edema.  Some diffuse tenderness left neck and shoulder, lower back  Neurological: She is alert and oriented to person, place,  and time. Coordination normal.  Skin: Skin is warm and dry.  Psychiatric: She has a normal mood and affect.   Vitals:   07/06/17 1013  BP: 130/70  Pulse: 63  Temp: 98.2 F (36.8 C)  TempSrc: Oral  SpO2: 98%  Weight: 198 lb (89.8 kg)  Height: 5\' 4"  (1.626 m)      Assessment & Plan:

## 2017-07-09 ENCOUNTER — Other Ambulatory Visit: Payer: Self-pay

## 2017-07-09 ENCOUNTER — Encounter: Payer: Self-pay | Admitting: Internal Medicine

## 2017-07-09 LAB — ANA: Anti Nuclear Antibody(ANA): NEGATIVE

## 2017-07-09 LAB — RHEUMATOID FACTOR: Rhuematoid fact SerPl-aCnc: 27 IU/mL — ABNORMAL HIGH (ref <14)

## 2017-07-09 MED ORDER — FLUTICASONE FUROATE-VILANTEROL 200-25 MCG/INH IN AEPB: 1.0000 | INHALATION_SPRAY | Freq: Every day | RESPIRATORY_TRACT | 3 refills | Status: DC

## 2017-07-09 NOTE — Progress Notes (Signed)
Abstracted and sent to scan  

## 2017-07-10 ENCOUNTER — Other Ambulatory Visit (INDEPENDENT_AMBULATORY_CARE_PROVIDER_SITE_OTHER): Payer: BLUE CROSS/BLUE SHIELD

## 2017-07-10 ENCOUNTER — Telehealth: Payer: Self-pay

## 2017-07-10 DIAGNOSIS — E875 Hyperkalemia: Secondary | ICD-10-CM

## 2017-07-10 LAB — BASIC METABOLIC PANEL
BUN: 29 mg/dL — ABNORMAL HIGH (ref 6–23)
CHLORIDE: 108 meq/L (ref 96–112)
CO2: 24 mEq/L (ref 19–32)
CREATININE: 1.11 mg/dL (ref 0.40–1.20)
Calcium: 9.4 mg/dL (ref 8.4–10.5)
GFR: 62.49 mL/min (ref >60.00)
Glucose, Bld: 202 mg/dL — ABNORMAL HIGH (ref 70–99)
POTASSIUM: 5.2 meq/L — AB (ref 3.5–5.1)
SODIUM: 138 meq/L (ref 135–145)

## 2017-07-10 NOTE — Telephone Encounter (Signed)
When going over labs with patient, patient stated that she was getting really sleepy while taking the baclofen. States she knows you said that would probably happen but that she is still working and cant have that happen. Wants to know if she can take it just at night or if there is something else that is more appropriate? Please advise. Thanks

## 2017-07-11 ENCOUNTER — Other Ambulatory Visit: Payer: Self-pay | Admitting: Internal Medicine

## 2017-07-11 DIAGNOSIS — R768 Other specified abnormal immunological findings in serum: Secondary | ICD-10-CM

## 2017-07-11 MED ORDER — PREDNISONE 20 MG PO TABS: 40.0000 mg | ORAL_TABLET | Freq: Every day | ORAL | 0 refills | Status: DC

## 2017-07-11 MED ORDER — METFORMIN HCL 1000 MG PO TABS: 1000.0000 mg | ORAL_TABLET | Freq: Two times a day (BID) | ORAL | 3 refills | Status: DC

## 2017-07-11 NOTE — Telephone Encounter (Signed)
LVM for patient to call back for MD response  

## 2017-07-11 NOTE — Telephone Encounter (Signed)
Patient given MD response and stated understanding  

## 2017-07-11 NOTE — Telephone Encounter (Signed)
Yes, would recommend to take only at night if it is making her too sleepy. She can try taking 1/2 during the day when she will be home not working to see if this still helps but does not make her drowsy.

## 2017-07-13 ENCOUNTER — Inpatient Hospital Stay (HOSPITAL_COMMUNITY)
Admission: EM | Admit: 2017-07-13 | Discharge: 2017-07-17 | DRG: 641 | Disposition: A | Discharge status: Home / Self Care | Payer: BLUE CROSS/BLUE SHIELD | Attending: Internal Medicine | Admitting: Internal Medicine

## 2017-07-13 DIAGNOSIS — E0500 Thyrotoxicosis with diffuse goiter without thyrotoxic crisis or storm: Secondary | ICD-10-CM | POA: Diagnosis present

## 2017-07-13 DIAGNOSIS — Z833 Family history of diabetes mellitus: Secondary | ICD-10-CM

## 2017-07-13 DIAGNOSIS — R001 Bradycardia, unspecified: Secondary | ICD-10-CM | POA: Diagnosis present

## 2017-07-13 DIAGNOSIS — I10 Essential (primary) hypertension: Secondary | ICD-10-CM | POA: Diagnosis present

## 2017-07-13 DIAGNOSIS — N179 Acute kidney failure, unspecified: Secondary | ICD-10-CM

## 2017-07-13 DIAGNOSIS — Z7951 Long term (current) use of inhaled steroids: Secondary | ICD-10-CM

## 2017-07-13 DIAGNOSIS — E0590 Thyrotoxicosis, unspecified without thyrotoxic crisis or storm: Secondary | ICD-10-CM | POA: Diagnosis present

## 2017-07-13 DIAGNOSIS — E1165 Type 2 diabetes mellitus with hyperglycemia: Secondary | ICD-10-CM | POA: Diagnosis present

## 2017-07-13 DIAGNOSIS — T50905A Adverse effect of unspecified drugs, medicaments and biological substances, initial encounter: Secondary | ICD-10-CM | POA: Diagnosis present

## 2017-07-13 DIAGNOSIS — Z7982 Long term (current) use of aspirin: Secondary | ICD-10-CM

## 2017-07-13 DIAGNOSIS — E860 Dehydration: Secondary | ICD-10-CM | POA: Diagnosis present

## 2017-07-13 DIAGNOSIS — I4891 Unspecified atrial fibrillation: Secondary | ICD-10-CM | POA: Diagnosis present

## 2017-07-13 DIAGNOSIS — F4321 Adjustment disorder with depressed mood: Secondary | ICD-10-CM | POA: Diagnosis present

## 2017-07-13 DIAGNOSIS — I471 Supraventricular tachycardia: Secondary | ICD-10-CM | POA: Diagnosis present

## 2017-07-13 DIAGNOSIS — T464X5A Adverse effect of angiotensin-converting-enzyme inhibitors, initial encounter: Secondary | ICD-10-CM | POA: Diagnosis present

## 2017-07-13 DIAGNOSIS — Z794 Long term (current) use of insulin: Secondary | ICD-10-CM

## 2017-07-13 DIAGNOSIS — M069 Rheumatoid arthritis, unspecified: Secondary | ICD-10-CM | POA: Diagnosis present

## 2017-07-13 DIAGNOSIS — E1142 Type 2 diabetes mellitus with diabetic polyneuropathy: Secondary | ICD-10-CM | POA: Diagnosis present

## 2017-07-13 DIAGNOSIS — Z7901 Long term (current) use of anticoagulants: Secondary | ICD-10-CM

## 2017-07-13 DIAGNOSIS — W19XXXA Unspecified fall, initial encounter: Secondary | ICD-10-CM

## 2017-07-13 DIAGNOSIS — E875 Hyperkalemia: Secondary | ICD-10-CM | POA: Diagnosis present

## 2017-07-13 DIAGNOSIS — Z6833 Body mass index (BMI) 33.0-33.9, adult: Secondary | ICD-10-CM

## 2017-07-13 DIAGNOSIS — Z79899 Other long term (current) drug therapy: Secondary | ICD-10-CM

## 2017-07-13 DIAGNOSIS — E871 Hypo-osmolality and hyponatremia: Secondary | ICD-10-CM | POA: Diagnosis present

## 2017-07-13 DIAGNOSIS — E782 Mixed hyperlipidemia: Secondary | ICD-10-CM | POA: Diagnosis present

## 2017-07-13 DIAGNOSIS — Z885 Allergy status to narcotic agent status: Secondary | ICD-10-CM

## 2017-07-13 DIAGNOSIS — N183 Chronic kidney disease, stage 3 (moderate): Secondary | ICD-10-CM | POA: Diagnosis present

## 2017-07-13 DIAGNOSIS — E118 Type 2 diabetes mellitus with unspecified complications: Secondary | ICD-10-CM

## 2017-07-13 DIAGNOSIS — Z7952 Long term (current) use of systemic steroids: Secondary | ICD-10-CM

## 2017-07-13 DIAGNOSIS — Y92009 Unspecified place in unspecified non-institutional (private) residence as the place of occurrence of the external cause: Secondary | ICD-10-CM

## 2017-07-13 DIAGNOSIS — I480 Paroxysmal atrial fibrillation: Secondary | ICD-10-CM | POA: Diagnosis present

## 2017-07-13 DIAGNOSIS — I5032 Chronic diastolic (congestive) heart failure: Secondary | ICD-10-CM | POA: Diagnosis present

## 2017-07-13 DIAGNOSIS — F4320 Adjustment disorder, unspecified: Secondary | ICD-10-CM | POA: Diagnosis present

## 2017-07-13 DIAGNOSIS — E1122 Type 2 diabetes mellitus with diabetic chronic kidney disease: Secondary | ICD-10-CM | POA: Diagnosis present

## 2017-07-13 DIAGNOSIS — I130 Hypertensive heart and chronic kidney disease with heart failure and stage 1 through stage 4 chronic kidney disease, or unspecified chronic kidney disease: Secondary | ICD-10-CM | POA: Diagnosis present

## 2017-07-13 DIAGNOSIS — N189 Chronic kidney disease, unspecified: Secondary | ICD-10-CM | POA: Diagnosis present

## 2017-07-13 NOTE — ED Provider Notes (Signed)
[facility] EMERGENCY DEPARTMENT Provider Note   CSN: [id number] Arrival date & time: 07/13/17  2336     History   Chief Complaint Chief Complaint  Patient presents with  . Dizziness  . Fall    HPI Donna Booker is a 72 y.o. female.  Patient with past medical history remarkable for A. fib on Eliquis, Graves' disease, hypertension, and diabetes presents to the emergency department with chief complaint of weakness and dizziness.  She states that she has been feeling like this for the past several days.  She also reports some mild headache.  States that she was trying to go up her stairs and states that her legs felt weak and caused her to fall.  She states that she was seen recently by her doctor for similar symptoms and was diagnosed with rheumatoid arthritis.  She states that she does have generalized joint pain.  She has not taken anything for her symptoms.  She states that she fell today because of weakness and not because of pain  The history is provided by the patient. No language interpreter was used.    Past Medical History:  Diagnosis Date  . Diabetes mellitus   . Graves disease   . Hypertension   . Hyperthyroidism     Patient Active Problem List   Diagnosis Date Noted  . Myalgia 07/06/2017  . Acute gout 05/15/2017  . Hyperkalemia 05/11/2017  . ARF (acute renal failure) (HCC) 05/11/2017  . New onset of congestive heart failure (HCC) 04/21/2017  . Acute on chronic respiratory failure with hypoxia (HCC) 04/21/2017  . Leukocytosis 04/21/2017  . History of atrial fibrillation   . Cough 04/20/2017  . Hyperthyroidism 04/18/2017  . Atrial fibrillation with RVR (HCC) 04/15/2017  . Routine general medical examination at a health care facility 10/20/2015  . Uncontrolled type 2 diabetes mellitus with diabetic polyneuropathy, with long-term current use of insulin (HCC) 02/25/2015  . Right foot pain 01/28/2015  . Hyperlipidemia 09/02/2014  . Morbid  obesity (HCC) 09/02/2014  . HTN (hypertension) 10/04/2012    Past Surgical History:  Procedure Laterality Date  . BREAST SURGERY       OB History   None      Home Medications    Prior to Admission medications   Medication Sig Start Date End Date Taking? Authorizing Provider  apixaban (ELIQUIS) 5 MG TABS tablet Take 1 tablet (5 mg total) by mouth 2 (two) times daily. 04/20/17   Calvert Cantor, MD  aspirin EC 81 MG tablet Take 81 mg by mouth daily.    [provider]  atenolol (TENORMIN) 50 MG tablet Take 1 tablet (50 mg total) by mouth 2 (two) times daily. 05/13/17 06/12/17  Zigmund Daniel., MD  baclofen (LIORESAL) 10 MG tablet Take 1 tablet (10 mg total) by mouth 3 (three) times daily. 07/06/17   Myrlene Broker, MD  diclofenac sodium (VOLTAREN) 1 % GEL Apply 2 g topically 4 (four) times daily as needed (pain). 04/24/17   Alwyn Ren, MD  esomeprazole (NEXIUM) 40 MG capsule Take 40 mg by mouth daily before breakfast.    [provider]  fluticasone furoate-vilanterol (BREO ELLIPTA) 200-25 MCG/INH AEPB Inhale 1 puff into the lungs daily. 07/09/17   Myrlene Broker, MD  gabapentin (NEURONTIN) 300 MG capsule Take 1 capsule (300 mg total) by mouth 3 (three) times daily. 06/13/17   Myrlene Broker, MD  glipiZIDE (GLUCOTROL) 5 MG tablet Take 1 tablet (5 mg  total) by mouth 2 (two) times daily before a meal. NEED ANNUAL VISIT FOR REFILLS 12/13/16   Myrlene Broker, MD  insulin detemir (LEVEMIR) 100 UNIT/ML injection Inject 0.25 mLs (25 Units total) into the skin daily at 10 pm. 04/24/17   Alwyn Ren, MD  loratadine (CLARITIN) 10 MG tablet Take 1 tablet (10 mg total) by mouth at bedtime. 04/24/17   Alwyn Ren, MD  metFORMIN (GLUCOPHAGE) 1000 MG tablet Take 1 tablet (1,000 mg total) by mouth 2 (two) times daily with a meal. 07/11/17   Myrlene Broker, MD  methimazole (TAPAZOLE) 5 MG tablet Take 2 tablets (10 mg total) by  mouth 2 (two) times daily. Patient taking differently: Take 5 mg by mouth 2 (two) times daily.  05/10/17   Carlus Pavlov, MD  pravastatin (PRAVACHOL) 40 MG tablet Take 1 tablet (40 mg total) by mouth daily at 6 PM. 04/24/17   Alwyn Ren, MD  predniSONE (DELTASONE) 20 MG tablet Take 2 tablets (40 mg total) by mouth daily with breakfast. 07/11/17   Myrlene Broker, MD    Family History Family History  Problem Relation Age of Onset  . Diabetes Mellitus II Mother   . Diabetes Mellitus II Father   . Hypertension Father   . Hyperlipidemia Father     Social History Social History   Tobacco Use  . Smoking status: Never Smoker  . Smokeless tobacco: Never Used  Substance Use Topics  . Alcohol use: No  . Drug use: No     Allergies   Tramadol   Review of Systems Review of Systems  All other systems reviewed and are negative.    Physical Exam Updated Vital Signs BP (!) 152/58 (BP Location: Right Arm)   Pulse (!) 59   Resp (!) 8   SpO2 100%   Physical Exam  Constitutional: She is oriented to person, place, and time. She appears well-developed and well-nourished.  HENT:  Head: Normocephalic and atraumatic.  Eyes: Pupils are equal, round, and reactive to light. Conjunctivae and EOM are normal.  Neck: Normal range of motion. Neck supple.  Cardiovascular: Normal rate and regular rhythm. Exam reveals no gallop and no friction rub.  No murmur heard. Pulmonary/Chest: Effort normal and breath sounds normal. No respiratory distress. She has no wheezes. She has no rales. She exhibits no tenderness.  Abdominal: Soft. Bowel sounds are normal. She exhibits no distension and no mass. There is no tenderness. There is no rebound and no guarding.  Musculoskeletal: Normal range of motion. She exhibits no edema or tenderness.  Neurological: She is alert and oriented to person, place, and time.  CN III-XII intact, speech is clear, movements are goal oriented, range of motion and  strength of extremities is grossly intact  Skin: Skin is warm and dry.  Psychiatric: She has a normal mood and affect. Her behavior is normal. Judgment and thought content normal.  Nursing note and vitals reviewed.    ED Treatments / Results  Labs (all labs ordered are listed, but only abnormal results are displayed) Labs Reviewed  CBC WITH DIFFERENTIAL/PLATELET - Abnormal; Notable for the following components:      Result Value   Hemoglobin 11.3 (*)    MCH 23.5 (*)    MCHC 29.9 (*)    RDW 16.5 (*)    All other components within normal limits  TSH - Abnormal; Notable for the following components:   TSH <0.010 (*)    All other components within normal  limits  BASIC METABOLIC PANEL - Abnormal; Notable for the following components:   Sodium 130 (*)    Potassium 7.5 (*)    CO2 17 (*)    Glucose, Bld 443 (*)    BUN 41 (*)    Creatinine, Ser 1.38 (*)    GFR calc non Af Amer 38 (*)    GFR calc Af Amer 44 (*)    All other components within normal limits  POTASSIUM - Abnormal; Notable for the following components:   Potassium 7.0 (*)    All other components within normal limits  URINALYSIS, ROUTINE W REFLEX MICROSCOPIC  I-STAT TROPONIN, ED    EKG None  Radiology No results found.  Procedures Procedures (including critical care time) CRITICAL CARE Performed by: Roxy Horseman   Total critical care time: 41 minutes  Critical care time was exclusive of separately billable procedures and treating other patients.  Critical care was necessary to treat or prevent imminent or life-threatening deterioration.  Critical care was time spent personally by me on the following activities: development of treatment plan with patient and/or surrogate as well as nursing, discussions with consultants, evaluation of patient's response to treatment, examination of patient, obtaining history from patient or surrogate, ordering and performing treatments and interventions, ordering and review  of laboratory studies, ordering and review of radiographic studies, pulse oximetry and re-evaluation of patient's condition.  Medications Ordered in ED Medications - No data to display   Initial Impression / Assessment and Plan / ED Course  I have reviewed the triage vital signs and the nursing notes.  Pertinent labs & imaging results that were available during my care of the patient were reviewed by me and considered in my medical decision making (see chart for details).     Generalized weakness some dizziness, and a fall today.  Vital signs are stable.  Potassium is noted to be 7.5.  Will recheck, but will also treat as no evidence of hemolysis.  Patient also has mild AKI.  She has had some headache and lower extremity weakness which is been ongoing for the past several days.   Patient discussed with Dr. Patria Mane, who recommends admission.  Appreciate Dr. Julian Reil for taking admission.  Will be a carryover until the AM.  Final Clinical Impressions(s) / ED Diagnoses   Final diagnoses:  Hyperkalemia  AKI (acute kidney injury) [facility])    ED Discharge Orders    None       Roxy Horseman, PA-C 07/14/17 0549    Azalia Bilis, MD 07/14/17 [phone]

## 2017-07-13 NOTE — ED Triage Notes (Signed)
Pt BIB EMS from home c/o fall. Reports dizziness, leg cramps today which led to a fall. Pt denies syncope, LOC, or obvious injury. Hx of hyperkalemia in past, with EMS reporting peak T waves on EKG. Pt also reports CBGs in the 400s recently. Placed back on metformin and prednisone recently by PCP. Denies SOB, CP or edema.

## 2017-07-14 ENCOUNTER — Encounter (HOSPITAL_COMMUNITY): Payer: Self-pay | Admitting: Emergency Medicine

## 2017-07-14 ENCOUNTER — Other Ambulatory Visit: Payer: Self-pay

## 2017-07-14 ENCOUNTER — Emergency Department (HOSPITAL_COMMUNITY): Payer: BLUE CROSS/BLUE SHIELD

## 2017-07-14 DIAGNOSIS — W19XXXA Unspecified fall, initial encounter: Secondary | ICD-10-CM | POA: Diagnosis present

## 2017-07-14 DIAGNOSIS — E1142 Type 2 diabetes mellitus with diabetic polyneuropathy: Secondary | ICD-10-CM | POA: Diagnosis present

## 2017-07-14 DIAGNOSIS — E871 Hypo-osmolality and hyponatremia: Secondary | ICD-10-CM | POA: Diagnosis present

## 2017-07-14 DIAGNOSIS — Z885 Allergy status to narcotic agent status: Secondary | ICD-10-CM | POA: Diagnosis not present

## 2017-07-14 DIAGNOSIS — Z6833 Body mass index (BMI) 33.0-33.9, adult: Secondary | ICD-10-CM | POA: Diagnosis not present

## 2017-07-14 DIAGNOSIS — Z7901 Long term (current) use of anticoagulants: Secondary | ICD-10-CM | POA: Diagnosis not present

## 2017-07-14 DIAGNOSIS — Z794 Long term (current) use of insulin: Secondary | ICD-10-CM | POA: Diagnosis not present

## 2017-07-14 DIAGNOSIS — T50905A Adverse effect of unspecified drugs, medicaments and biological substances, initial encounter: Secondary | ICD-10-CM | POA: Diagnosis present

## 2017-07-14 DIAGNOSIS — F4320 Adjustment disorder, unspecified: Secondary | ICD-10-CM | POA: Diagnosis present

## 2017-07-14 DIAGNOSIS — I5032 Chronic diastolic (congestive) heart failure: Secondary | ICD-10-CM | POA: Diagnosis present

## 2017-07-14 DIAGNOSIS — N179 Acute kidney failure, unspecified: Secondary | ICD-10-CM | POA: Diagnosis present

## 2017-07-14 DIAGNOSIS — E782 Mixed hyperlipidemia: Secondary | ICD-10-CM | POA: Diagnosis present

## 2017-07-14 DIAGNOSIS — I471 Supraventricular tachycardia: Secondary | ICD-10-CM | POA: Diagnosis present

## 2017-07-14 DIAGNOSIS — Z833 Family history of diabetes mellitus: Secondary | ICD-10-CM | POA: Diagnosis not present

## 2017-07-14 DIAGNOSIS — Y92009 Unspecified place in unspecified non-institutional (private) residence as the place of occurrence of the external cause: Secondary | ICD-10-CM

## 2017-07-14 DIAGNOSIS — F4321 Adjustment disorder with depressed mood: Secondary | ICD-10-CM | POA: Diagnosis present

## 2017-07-14 DIAGNOSIS — E875 Hyperkalemia: Secondary | ICD-10-CM | POA: Diagnosis present

## 2017-07-14 DIAGNOSIS — E1165 Type 2 diabetes mellitus with hyperglycemia: Secondary | ICD-10-CM | POA: Diagnosis present

## 2017-07-14 DIAGNOSIS — N183 Chronic kidney disease, stage 3 (moderate): Secondary | ICD-10-CM | POA: Diagnosis present

## 2017-07-14 DIAGNOSIS — I480 Paroxysmal atrial fibrillation: Secondary | ICD-10-CM | POA: Diagnosis present

## 2017-07-14 DIAGNOSIS — I130 Hypertensive heart and chronic kidney disease with heart failure and stage 1 through stage 4 chronic kidney disease, or unspecified chronic kidney disease: Secondary | ICD-10-CM | POA: Diagnosis present

## 2017-07-14 DIAGNOSIS — Z7982 Long term (current) use of aspirin: Secondary | ICD-10-CM | POA: Diagnosis not present

## 2017-07-14 DIAGNOSIS — E860 Dehydration: Secondary | ICD-10-CM | POA: Diagnosis present

## 2017-07-14 DIAGNOSIS — E1122 Type 2 diabetes mellitus with diabetic chronic kidney disease: Secondary | ICD-10-CM | POA: Diagnosis present

## 2017-07-14 DIAGNOSIS — E0500 Thyrotoxicosis with diffuse goiter without thyrotoxic crisis or storm: Secondary | ICD-10-CM | POA: Diagnosis present

## 2017-07-14 DIAGNOSIS — M069 Rheumatoid arthritis, unspecified: Secondary | ICD-10-CM | POA: Diagnosis present

## 2017-07-14 DIAGNOSIS — T464X5A Adverse effect of angiotensin-converting-enzyme inhibitors, initial encounter: Secondary | ICD-10-CM | POA: Diagnosis present

## 2017-07-14 LAB — CBC WITH DIFFERENTIAL/PLATELET
ABS IMMATURE GRANULOCYTES: 0 10*3/uL (ref 0.0–0.1)
BASOS ABS: 0 10*3/uL (ref 0.0–0.1)
BASOS ABS: 0 10*3/uL (ref 0.0–0.1)
BASOS PCT: 0 %
BLASTS: 0 %
Band Neutrophils: 0 %
Basophils Relative: 0 %
EOS ABS: 0 10*3/uL (ref 0.0–0.7)
EOS PCT: 0 %
Eosinophils Absolute: 0.1 10*3/uL (ref 0.0–0.7)
Eosinophils Relative: 1 %
HCT: 37.8 % (ref 36.0–46.0)
HCT: 32.5 % — ABNORMAL LOW (ref 36.0–46.0)
Hemoglobin: 11.3 g/dL — ABNORMAL LOW (ref 12.0–15.0)
Hemoglobin: 9.8 g/dL — ABNORMAL LOW (ref 12.0–15.0)
IMMATURE GRANULOCYTES: 0 %
LYMPHS PCT: 35 %
Lymphocytes Relative: 28 %
Lymphs Abs: 2.7 10*3/uL (ref 0.7–4.0)
Lymphs Abs: 4.3 10*3/uL — ABNORMAL HIGH (ref 0.7–4.0)
MCH: 23.5 pg — AB (ref 26.0–34.0)
MCH: 23.7 pg — AB (ref 26.0–34.0)
MCHC: 29.9 g/dL — ABNORMAL LOW (ref 30.0–36.0)
MCHC: 30.2 g/dL (ref 30.0–36.0)
MCV: 78.8 fL (ref 78.0–100.0)
MCV: 78.5 fL (ref 78.0–100.0)
MONO ABS: 0.5 10*3/uL (ref 0.1–1.0)
Metamyelocytes Relative: 0 %
Monocytes Absolute: 1.1 10*3/uL — ABNORMAL HIGH (ref 0.1–1.0)
Monocytes Relative: 5 %
Monocytes Relative: 9 %
Myelocytes: 0 %
NEUTROS ABS: 6.4 10*3/uL (ref 1.7–7.7)
NEUTROS ABS: 6.8 10*3/uL (ref 1.7–7.7)
NEUTROS PCT: 55 %
NRBC: 0 /100{WBCs}
Neutrophils Relative %: 67 %
PLATELETS: 286 10*3/uL (ref 150–400)
PLATELETS: 310 10*3/uL (ref 150–400)
Promyelocytes Relative: 0 %
RBC: 4.8 MIL/uL (ref 3.87–5.11)
RBC: 4.14 MIL/uL (ref 3.87–5.11)
RDW: 16.5 % — AB (ref 11.5–15.5)
RDW: 17 % — AB (ref 11.5–15.5)
WBC: 12.3 10*3/uL — AB (ref 4.0–10.5)
WBC: 9.6 10*3/uL (ref 4.0–10.5)

## 2017-07-14 LAB — URINALYSIS, ROUTINE W REFLEX MICROSCOPIC
Bilirubin Urine: NEGATIVE
HGB URINE DIPSTICK: NEGATIVE
Ketones, ur: NEGATIVE mg/dL
Leukocytes, UA: NEGATIVE
Nitrite: NEGATIVE
PH: 5 (ref 5.0–8.0)
PROTEIN: NEGATIVE mg/dL
Specific Gravity, Urine: 1.009 (ref 1.005–1.030)

## 2017-07-14 LAB — HEMOGLOBIN A1C
HEMOGLOBIN A1C: 9.8 % — AB (ref 4.8–5.6)
Mean Plasma Glucose: 234.56 mg/dL

## 2017-07-14 LAB — I-STAT TROPONIN, ED: Troponin i, poc: 0.01 ng/mL (ref 0.00–0.08)

## 2017-07-14 LAB — BASIC METABOLIC PANEL
ANION GAP: 9 (ref 5–15)
Anion gap: 7 (ref 5–15)
BUN: 41 mg/dL — AB (ref 8–23)
BUN: 33 mg/dL — AB (ref 8–23)
CHLORIDE: 104 mmol/L (ref 98–111)
CO2: 17 mmol/L — ABNORMAL LOW (ref 22–32)
CO2: 23 mmol/L (ref 22–32)
Calcium: 8.9 mg/dL (ref 8.9–10.3)
Calcium: 9 mg/dL (ref 8.9–10.3)
Chloride: 111 mmol/L (ref 98–111)
Creatinine, Ser: 1.38 mg/dL — ABNORMAL HIGH (ref 0.44–1.00)
Creatinine, Ser: 1.35 mg/dL — ABNORMAL HIGH (ref 0.44–1.00)
GFR calc Af Amer: 45 mL/min — ABNORMAL LOW (ref >60)
GFR, EST AFRICAN AMERICAN: 44 mL/min — AB (ref >60)
GFR, EST NON AFRICAN AMERICAN: 38 mL/min — AB (ref >60)
GFR, EST NON AFRICAN AMERICAN: 39 mL/min — AB (ref >60)
GLUCOSE: 144 mg/dL — AB (ref 70–99)
Glucose, Bld: 443 mg/dL — ABNORMAL HIGH (ref 70–99)
POTASSIUM: 7.5 mmol/L — AB (ref 3.5–5.1)
POTASSIUM: 4.4 mmol/L (ref 3.5–5.1)
SODIUM: 130 mmol/L — AB (ref 135–145)
Sodium: 141 mmol/L (ref 135–145)

## 2017-07-14 LAB — GLUCOSE, CAPILLARY
Glucose-Capillary: 138 mg/dL — ABNORMAL HIGH (ref 70–99)
Glucose-Capillary: 130 mg/dL — ABNORMAL HIGH (ref 70–99)
Glucose-Capillary: 198 mg/dL — ABNORMAL HIGH (ref 70–99)

## 2017-07-14 LAB — MRSA PCR SCREENING: MRSA BY PCR: NEGATIVE

## 2017-07-14 LAB — POTASSIUM: Potassium: 7 mmol/L (ref 3.5–5.1)

## 2017-07-14 LAB — TSH

## 2017-07-14 MED ORDER — ACETAMINOPHEN 650 MG RE SUPP: 650.0000 mg | Freq: Four times a day (QID) | RECTAL | Status: DC | PRN

## 2017-07-14 MED ORDER — SODIUM CHLORIDE 0.9 % IV SOLN
INTRAVENOUS | Status: DC
  Administered 2017-07-14 – 2017-07-16 (×4): via INTRAVENOUS

## 2017-07-14 MED ORDER — HYDROCODONE-ACETAMINOPHEN 5-325 MG PO TABS
1.0000 | ORAL_TABLET | ORAL | Status: DC | PRN
  Administered 2017-07-14 (×2): 2 via ORAL
  Administered 2017-07-15: 1 via ORAL
  Administered 2017-07-16 (×2): 2 via ORAL
  Filled 2017-07-14 (×2): qty 2
  Filled 2017-07-14: qty 1
  Filled 2017-07-14 (×2): qty 2

## 2017-07-14 MED ORDER — TRAZODONE HCL 50 MG PO TABS: 25.0000 mg | ORAL_TABLET | Freq: Every evening | ORAL | Status: DC | PRN

## 2017-07-14 MED ORDER — PANTOPRAZOLE SODIUM 40 MG PO TBEC
40.0000 mg | DELAYED_RELEASE_TABLET | Freq: Every day | ORAL | Status: DC
  Administered 2017-07-14 – 2017-07-17 (×4): 40 mg via ORAL
  Filled 2017-07-14 (×4): qty 1

## 2017-07-14 MED ORDER — SODIUM CHLORIDE 0.9 % IV SOLN
Freq: Once | INTRAVENOUS | Status: AC
  Administered 2017-07-14: 06:00:00 via INTRAVENOUS

## 2017-07-14 MED ORDER — METHIMAZOLE 5 MG PO TABS
5.0000 mg | ORAL_TABLET | Freq: Two times a day (BID) | ORAL | Status: DC
  Administered 2017-07-14 – 2017-07-17 (×7): 5 mg via ORAL
  Filled 2017-07-14 (×8): qty 1

## 2017-07-14 MED ORDER — INSULIN ASPART 100 UNIT/ML IV SOLN
20.0000 [IU] | INTRAVENOUS | Status: AC
  Filled 2017-07-14: qty 0.2

## 2017-07-14 MED ORDER — INSULIN ASPART 100 UNIT/ML ~~LOC~~ SOLN
0.0000 [IU] | Freq: Three times a day (TID) | SUBCUTANEOUS | Status: DC
  Administered 2017-07-14 (×2): 3 [IU] via SUBCUTANEOUS
  Administered 2017-07-15: 4 [IU] via SUBCUTANEOUS
  Administered 2017-07-15: 3 [IU] via SUBCUTANEOUS
  Administered 2017-07-16: 4 [IU] via SUBCUTANEOUS
  Administered 2017-07-16: 3 [IU] via SUBCUTANEOUS
  Administered 2017-07-16 – 2017-07-17 (×2): 4 [IU] via SUBCUTANEOUS
  Administered 2017-07-17: 7 [IU] via SUBCUTANEOUS

## 2017-07-14 MED ORDER — ONDANSETRON HCL 4 MG/2ML IJ SOLN: 4.0000 mg | Freq: Four times a day (QID) | INTRAMUSCULAR | Status: DC | PRN

## 2017-07-14 MED ORDER — ACETAMINOPHEN 325 MG PO TABS: 650.0000 mg | ORAL_TABLET | Freq: Four times a day (QID) | ORAL | Status: DC | PRN

## 2017-07-14 MED ORDER — ATENOLOL 50 MG PO TABS
50.0000 mg | ORAL_TABLET | Freq: Two times a day (BID) | ORAL | Status: DC
  Administered 2017-07-14 – 2017-07-15 (×3): 50 mg via ORAL
  Filled 2017-07-14 (×3): qty 1

## 2017-07-14 MED ORDER — GLIPIZIDE 5 MG PO TABS
5.0000 mg | ORAL_TABLET | Freq: Two times a day (BID) | ORAL | Status: DC
  Administered 2017-07-14 – 2017-07-15 (×2): 5 mg via ORAL
  Filled 2017-07-14 (×2): qty 1

## 2017-07-14 MED ORDER — PRAVASTATIN SODIUM 40 MG PO TABS
40.0000 mg | ORAL_TABLET | Freq: Every day | ORAL | Status: DC
  Administered 2017-07-14 – 2017-07-16 (×3): 40 mg via ORAL
  Filled 2017-07-14 (×3): qty 1

## 2017-07-14 MED ORDER — GABAPENTIN 300 MG PO CAPS
300.0000 mg | ORAL_CAPSULE | Freq: Three times a day (TID) | ORAL | Status: DC
  Administered 2017-07-14 – 2017-07-17 (×11): 300 mg via ORAL
  Filled 2017-07-14 (×11): qty 1

## 2017-07-14 MED ORDER — INSULIN ASPART 100 UNIT/ML ~~LOC~~ SOLN
10.0000 [IU] | Freq: Once | SUBCUTANEOUS | Status: AC
Start: 2017-07-14 — End: 2017-07-14
  Administered 2017-07-14: 10 [IU] via INTRAVENOUS
  Filled 2017-07-14: qty 1

## 2017-07-14 MED ORDER — SODIUM CHLORIDE 0.9 % IV SOLN
1.0000 g | Freq: Once | INTRAVENOUS | Status: AC
  Administered 2017-07-14: 1 g via INTRAVENOUS
  Filled 2017-07-14: qty 10

## 2017-07-14 MED ORDER — INSULIN DETEMIR 100 UNIT/ML ~~LOC~~ SOLN
25.0000 [IU] | Freq: Every day | SUBCUTANEOUS | Status: DC
  Administered 2017-07-14: 25 [IU] via SUBCUTANEOUS
  Filled 2017-07-14: qty 0.25

## 2017-07-14 MED ORDER — INSULIN ASPART 100 UNIT/ML ~~LOC~~ SOLN
0.0000 [IU] | Freq: Every day | SUBCUTANEOUS | Status: DC
  Administered 2017-07-16: 2 [IU] via SUBCUTANEOUS

## 2017-07-14 MED ORDER — ONDANSETRON HCL 4 MG PO TABS
4.0000 mg | ORAL_TABLET | Freq: Four times a day (QID) | ORAL | Status: DC | PRN
  Administered 2017-07-15: 4 mg via ORAL
  Filled 2017-07-14: qty 1

## 2017-07-14 MED ORDER — BACLOFEN 5 MG HALF TABLET: 5.0000 mg | ORAL_TABLET | ORAL | Status: DC

## 2017-07-14 MED ORDER — BACLOFEN 5 MG HALF TABLET
5.0000 mg | ORAL_TABLET | Freq: Every day | ORAL | Status: DC
Start: 2017-07-14 — End: 2017-07-17
  Administered 2017-07-16 – 2017-07-17 (×2): 5 mg via ORAL
  Filled 2017-07-14 (×3): qty 1

## 2017-07-14 MED ORDER — SODIUM BICARBONATE 8.4 % IV SOLN
50.0000 meq | Freq: Once | INTRAVENOUS | Status: AC
  Administered 2017-07-14: 50 meq via INTRAVENOUS
  Filled 2017-07-14: qty 50

## 2017-07-14 MED ORDER — ALBUTEROL SULFATE (2.5 MG/3ML) 0.083% IN NEBU
10.0000 mg | INHALATION_SOLUTION | Freq: Once | RESPIRATORY_TRACT | Status: AC
  Administered 2017-07-14: 10 mg via RESPIRATORY_TRACT
  Filled 2017-07-14: qty 12

## 2017-07-14 MED ORDER — FLUTICASONE FUROATE-VILANTEROL 200-25 MCG/INH IN AEPB
1.0000 | INHALATION_SPRAY | Freq: Every day | RESPIRATORY_TRACT | Status: DC
  Administered 2017-07-14 – 2017-07-17 (×4): 1 via RESPIRATORY_TRACT
  Filled 2017-07-14: qty 28

## 2017-07-14 MED ORDER — SODIUM POLYSTYRENE SULFONATE 15 GM/60ML PO SUSP
30.0000 g | Freq: Once | ORAL | Status: AC
  Administered 2017-07-14: 30 g via ORAL
  Filled 2017-07-14: qty 120

## 2017-07-14 MED ORDER — APIXABAN 5 MG PO TABS
5.0000 mg | ORAL_TABLET | Freq: Two times a day (BID) | ORAL | Status: DC
  Administered 2017-07-14 – 2017-07-17 (×7): 5 mg via ORAL
  Filled 2017-07-14 (×7): qty 1

## 2017-07-14 MED ORDER — BACLOFEN 5 MG HALF TABLET
10.0000 mg | ORAL_TABLET | Freq: Every day | ORAL | Status: DC
  Administered 2017-07-14 – 2017-07-16 (×3): 10 mg via ORAL
  Filled 2017-07-14 (×3): qty 2

## 2017-07-14 MED ORDER — COSYNTROPIN 0.25 MG IJ SOLR
0.2500 mg | Freq: Once | INTRAMUSCULAR | Status: AC
  Administered 2017-07-15: 0.25 mg via INTRAVENOUS
  Filled 2017-07-14: qty 0.25

## 2017-07-14 MED ORDER — SODIUM CHLORIDE 0.9% FLUSH
3.0000 mL | Freq: Two times a day (BID) | INTRAVENOUS | Status: DC
  Administered 2017-07-14 – 2017-07-17 (×6): 3 mL via INTRAVENOUS

## 2017-07-14 MED ORDER — DICLOFENAC SODIUM 1 % TD GEL: 2.0000 g | Freq: Four times a day (QID) | TRANSDERMAL | Status: DC | PRN

## 2017-07-14 MED ORDER — ASPIRIN EC 81 MG PO TBEC
81.0000 mg | DELAYED_RELEASE_TABLET | Freq: Every day | ORAL | Status: DC
  Administered 2017-07-14 – 2017-07-17 (×4): 81 mg via ORAL
  Filled 2017-07-14 (×4): qty 1

## 2017-07-14 NOTE — H&P (Signed)
History and Physical    Donna Booker RXV:[id number] DOB: 1945-04-13 DOA: 07/13/2017  PCP: Myrlene Broker, MD (Confirm with patient/family/NH records and if not entered, this has to be entered at [facility] point of entry) Patient coming from: Home via EMS  I have personally briefly reviewed patient's old medical records in [facility]  Chief Complaint: Weakness, dizziness and a fall at home this evening  HPI: Donna Booker is a 72 y.o. female with medical history significant of Rayna Sexton disease on methimazole, diabetes mellitus, hypertension, relation on Eliquis, heart failure with preserved ejection fraction of 60% noted in April 2019, admission to our facility on May 10, 2017 for hyperkalemia and acute renal failure presents to our emergency department complaining of weakness, dizziness and a fall at home this evening.  Her last admission she was discharged on May 12 her hyperkalemia was thought to be secondary to acute kidney injury in the setting of diuresis with Lasix in addition to lisinopril Prill and potassium supplementation.  She improved with Kayexalate, IV fluids and holding her ACE inhibitor and loop diuretic as well as potassium supplementation.  She will fibrillation was treated with Eliquis.  She developed sinus tachycardia with SVT and was briefly placed on a diltiazem drip but her right was well controlled on her home atenolol prior to discharge.  Has severe hyperthyroidism and has been on Tapazole followed by endocrinology with recent change in her methimazole to 5 mg twice daily Dr. Elvera Lennox on 05/11/2017.  Emergency department today reveals hyperkalemia with a potassium of 5 improved to 7.0 with temporary measures.  Mild acute kidney injury, and a blood glucose of 440.  Her bicarb is 17 but there is no anion gap.  Urgency department she is getting calcium Kayexalate and insulin.  Presentation is nearly identical to the one back in May.  That was ultimately thought due to  lisinopril and overdiuresis with Lasix and potassium supplementation.  Ultimately she improved by holding those medications with multiple rounds of Kayexalate.  Medication reconciliation is currently incomplete and at this point I am unsure as to which medication she is currently taking her medication bag includes a combination of ARB, diuretic, ACE medication, at this point it is difficult to assess until her medication reconciliation has been completed.  We will continue with measures to decrease her potassium and hold any medications that are increasing her potassium.  Of concern also is a fact that she has Graves' disease.  If in fact she is not taking the above-mentioned medications then we will need to evaluate the patient for Addison's disease with a Cortrosyn stimulation test.  Which will need to be determined once we have fully evaluate her medications.  In the emergency department she received calcium gluconate x2, a nebulizer x1, insulin IV 10 units x 1, Kayexalate 30 mg x 1, IV fluids, and sodium bicarb.  Patient will be admitted to the hospitalist service for further evaluation and management of severe hyperkalemia leading to weakness, dizziness and a fall at home with possible etiologies to include medication interactions versus type IV RTA versus Addison's disease.   Please note that this patient's recent few months have been fraught with personal difficulties including the critical illness and subsequent death of her son which has led her to be very sad and she has been having significant difficulties.  She has seen Dr. Okey Dupre on several occasions and has missed a cardiology appointment perhaps due to issues relating to her son's illness and subsequent  death.  It is highly likely and very possible that the patient's may not have adjusted her home medications to her new medications at discharge and is still been taking the medication she was taking back in May.  This will need to be  investigated.  Review of Systems: As per HPI otherwise all other systems reviewed and  negative.    Past Medical History:  Diagnosis Date  . Diabetes mellitus   . Graves disease   . Hypertension   . Hyperthyroidism     Past Surgical History:  Procedure Laterality Date  . BREAST SURGERY      Social History   Social History Narrative  . Not on file     reports that she has never smoked. She has never used smokeless tobacco. She reports that she does not drink alcohol or use drugs.  Allergies  Allergen Reactions  . Tramadol Nausea And Vomiting    Family History  Problem Relation Age of Onset  . Diabetes Mellitus II Mother   . Diabetes Mellitus II Father   . Hypertension Father   . Hyperlipidemia Father      Prior to Admission medications   Medication Sig Start Date End Date Taking? Authorizing Provider  apixaban (ELIQUIS) 5 MG TABS tablet Take 1 tablet (5 mg total) by mouth 2 (two) times daily. 04/20/17  Yes Calvert Cantor, MD  aspirin EC 81 MG tablet Take 81 mg by mouth daily.   Yes [provider]  atenolol (TENORMIN) 50 MG tablet Take 1 tablet (50 mg total) by mouth 2 (two) times daily. 05/13/17 07/14/25 Yes Zigmund Daniel., MD  baclofen (LIORESAL) 10 MG tablet Take 1 tablet (10 mg total) by mouth 3 (three) times daily. 07/06/17  Yes Myrlene Broker, MD  diclofenac sodium (VOLTAREN) 1 % GEL Apply 2 g topically 4 (four) times daily as needed (pain). 04/24/17  Yes Alwyn Ren, MD  esomeprazole (NEXIUM) 40 MG capsule Take 40 mg by mouth daily before breakfast.   Yes [provider]  fluticasone furoate-vilanterol (BREO ELLIPTA) 200-25 MCG/INH AEPB Inhale 1 puff into the lungs daily. 07/09/17  Yes Myrlene Broker, MD  gabapentin (NEURONTIN) 300 MG capsule Take 1 capsule (300 mg total) by mouth 3 (three) times daily. 06/13/17  Yes Myrlene Broker, MD  glipiZIDE (GLUCOTROL) 5 MG tablet Take 1 tablet (5 mg total) by mouth 2 (two)  times daily before a meal. NEED ANNUAL VISIT FOR REFILLS 12/13/16  Yes Myrlene Broker, MD  insulin detemir (LEVEMIR) 100 UNIT/ML injection Inject 0.25 mLs (25 Units total) into the skin daily at 10 pm. 04/24/17  Yes Alwyn Ren, MD  loratadine (CLARITIN) 10 MG tablet Take 1 tablet (10 mg total) by mouth at bedtime. 04/24/17  Yes Alwyn Ren, MD  metFORMIN (GLUCOPHAGE) 1000 MG tablet Take 1 tablet (1,000 mg total) by mouth 2 (two) times daily with a meal. 07/11/17  Yes Myrlene Broker, MD  methimazole (TAPAZOLE) 5 MG tablet Take 2 tablets (10 mg total) by mouth 2 (two) times daily. Patient taking differently: Take 5 mg by mouth 2 (two) times daily.  05/10/17  Yes Carlus Pavlov, MD  pravastatin (PRAVACHOL) 40 MG tablet Take 1 tablet (40 mg total) by mouth daily at 6 PM. 04/24/17  Yes Alwyn Ren, MD  predniSONE (DELTASONE) 20 MG tablet Take 2 tablets (40 mg total) by mouth daily with breakfast. 07/11/17  Yes Myrlene Broker, MD    Physical Exam:  Constitutional: NAD, calm, comfortable, awakens easily Vitals:   07/14/17 0600 07/14/17 0615 07/14/17 0630 07/14/17 0700  BP: (!) 128/56 (!) 130/58 (!) 126/52 (!) 119/54  Pulse: 70 70 76 73  Resp:  10 14 11   Temp:      TempSrc:      SpO2: 98% 98% 97% 98%  Weight:      Height:       Eyes: PERRL, lids and conjunctivae normal ENMT: Mucous membranes are moist. Posterior pharynx clear of any exudate or lesions.Normal dentition.  Neck: normal, supple, no masses, no thyromegaly Respiratory: clear to auscultation bilaterally, no wheezing, no crackles. Normal respiratory effort. No accessory muscle use.  Cardiovascular: Regular rate and rhythm, no murmurs / rubs / gallops. No extremity edema. 2+ pedal pulses. No carotid bruits.  Abdomen: no tenderness, no masses palpated. No hepatosplenomegaly. Bowel sounds positive.  Musculoskeletal: no clubbing / cyanosis. No joint deformity upper and lower extremities. Good  ROM, no contractures. Normal muscle tone.  Skin: no rashes, lesions, ulcers. No induration, Band-Aid on her back from a bleeding skin lesion Neurologic: CN 2-12 grossly intact. Sensation intact, DTR hyperreflexic. Strength 5/5 in all 4.  Psychiatric: Normal judgment and insight. Alert and oriented x 3. Normal mood.    Labs on Admission: I have personally reviewed following labs and imaging studies  CBC: Recent Labs  Lab 07/14/17 0015  WBC 9.6  NEUTROABS 6.4  HGB 11.3*  HCT 37.8  MCV 78.8  PLT 310   Basic Metabolic Panel: Recent Labs  Lab 07/10/17 1206 07/14/17 0205 07/14/17 0340  NA 138 130*  --   K 5.2* 7.5* 7.0*  CL 108 104  --   CO2 24 17*  --   GLUCOSE 202* 443*  --   BUN 29* 41*  --   CREATININE 1.11 1.38*  --   CALCIUM 9.4 8.9  --    GFR: Estimated Creatinine Clearance: 41.1 mL/min (A) (by C-G formula based on SCr of 1.38 mg/dL (H)). Thyroid Function Tests: Recent Labs    07/14/17 0015  TSH <0.010*   Urine analysis:    Component Value Date/Time   COLORURINE YELLOW 07/14/2017 0749   APPEARANCEUR HAZY (A) 07/14/2017 0749   LABSPEC 1.009 07/14/2017 0749   PHURINE 5.0 07/14/2017 0749   GLUCOSEU >=500 (A) 07/14/2017 0749   HGBUR NEGATIVE 07/14/2017 0749   BILIRUBINUR NEGATIVE 07/14/2017 0749   KETONESUR NEGATIVE 07/14/2017 0749   PROTEINUR NEGATIVE 07/14/2017 0749   UROBILINOGEN 0.2 05/04/2012 0728   NITRITE NEGATIVE 07/14/2017 0749   LEUKOCYTESUR NEGATIVE 07/14/2017 0749    Radiological Exams on Admission: No results found.  EKG: Independently reviewed.  Mild sinus bradycardia at 59 bpm with enhanced and prominent T waves but no significant peaking noted on our tracing here in the emergency department.  Tracings from EMS did reveal peaked T waves.  Assessment/Plan Principal Problem:   Hyperkalemia Active Problems:   Acute kidney injury superimposed on CKD (HCC)   Uncontrolled type 2 diabetes mellitus with diabetic polyneuropathy, with long-term  current use of insulin (HCC)   Atrial fibrillation with RVR (HCC)   Hyperthyroidism   Hyperglycemia due to type 2 diabetes mellitus (HCC)   Hyponatremia   Fall at home, initial encounter   Rheumatoid arthritis (HCC)   HTN (hypertension)   Morbid obesity (HCC)   Grief reaction    1.  Hyperkalemia: This is an incredibly complex problem and is compounded by several issues including medications, patient underlying renal disease, and potential other hormonal  issues.  On beta-blockers and NSAIDs per her current medication reconciliation form however in her bag there are additional medications including arms diuretics potassium sparing diuretics and medications which may be affecting her potassium including Eliquis.  With regards to renal issues this could be a type IV RTA, it could be related to her hyperosmolar state from her hyperkalemia could also be related to worsening insulin resistance given her elevated glucoses.  All of those dates can also cause hyperkalemia.  Patient denies taking any herbal medicines or nutritional supplements.  Is also the possibility that this could be a manifestation of Addison's disease in which case I have ordered a Cortrosyn stimulation test for tomorrow morning to start at 5:30 AM.  Certainly the key in this patient's care for today is not accurate and adequate review of her home medications to determine if she is still taking her medications the same as she was prior to her admission in May for hyperkalemia or if she has made adjustments as previously recommended.  I believe that the death of her son may have confounded and complicated her medication situation.  2.  Acute kidney injury superimposed on chronic kidney disease stage III: This may be a reflection of her medications if she still taken an ARB or a sign of worsening kidney disease associated with out-of-control diabetes.  We will gently hydrate the patient monitor closely.  3.  Uncontrolled type 2 diabetes  mellitus with diabetic polyneuropathy: Patient has recently had several rounds of steroids to help with her arthritis which I think have caused her diabetes to go even more out of control.  We will check a hemoglobin A1c.  We will refer her back to Dr. Elvera Lennox for further evaluation and management of this along with possible Addison's disease and profound hyperthyroidism unresponsive to methimazole.  4.  Atrial fibrillation with rapid ventricular response: In review of patient's medication she is taking Eliquis.  Eliquis can be associated with hyperkalemia and if all of the other avenues of investigation prove negative then perhaps revisiting whether or not she should take Eliquis versus another novel oral anticoagulant versus warfarin for her atrial fibrillation should be considered.  5.  Hyperthyroidism: After discharge in May patient was seen by Dr. Elvera Lennox who recommended that her is all dose be cut back from 10 twice daily to 5 twice daily.  TSH today remains very low.  At less than 0.010 and therefore her methimazole dose may need to be revisited or perhaps increased back.  I believe a conversation with Dr. Elvera Lennox prior to making any changes to her methimazole would be indicated.  6.  Hyperglycemia due to type 2 diabetes mellitus: Course this is complicated by her recent treatment with steroids.  She started the prednisone on Thursday and her blood glucoses here are in the 400 range.  Point we will hold prednisone on admission and will continue to adjust her insulin dosing as necessary.  7.  Hyponatremia: Patient appears to be taking chlorthalidone which can cause hyponatremia.  Will need further information from pharmacy once they reconciled her medications.  8.  Fall at home, initial encounter: Discharge it was recommended the patient have physical therapy.  But due to her son's illness and subsequent death she was unable to pursue that.  At this point I do think she would benefit from physical  therapy at discharge patient still works as a Occupational psychologist for [employer].  9.  Rheumatoid arthritis: Recently started on prednisone and baclofen for  treatment of her rheumatoid arthritis.  We will have to hold the prednisone for now and consider other medications at discharge.  Certainly she would benefit from further evaluation once we can sort out her hyponatremia and hyperkalemia issues.  10.  Hypertension: Oertli adequately controlled but medications may need to be adjusted if she is in fact on an ARB, diuretic, or ACE inhibitor.  11.  Morbid obesity: Her morbid obesity is driving her diabetes which is leading to her metabolic derangements leading to renal insufficiency even perhaps her hyperthyroidism.  Certainly her atrial fibrillation.  Hopefully patient can embark on a weight loss program that will assist her in her overall improvement in health.  12.  Grief reaction: Patient understandably grieving the loss of her son.  I think it is very important that we address her recent medical issues with this in mind.       DVT prophylaxis: On Eliquis Code Status: Code Family Communication: Retains capacity no family present with her. Disposition Plan: Likely home in 3 to 4 days Consults called: None for now Admission status: Inpatient  Lahoma Crocker MD FACP [facility] Pager [phone]  If 7PM-7AM, please contact night-coverage [email or web address] Password [facility]  07/14/2017, 8:44 AM

## 2017-07-14 NOTE — ED Notes (Signed)
Recollected light green tube for bmp blood test.  Sent to main lab.

## 2017-07-14 NOTE — ED Notes (Signed)
Pt ambulatory to restroom with minimal assistance from this NT.

## 2017-07-14 NOTE — ED Notes (Signed)
Attempted report 

## 2017-07-14 NOTE — ED Notes (Signed)
Patient up to bathroom with tech.

## 2017-07-15 DIAGNOSIS — E875 Hyperkalemia: Principal | ICD-10-CM

## 2017-07-15 LAB — BASIC METABOLIC PANEL
Anion gap: 7 (ref 5–15)
BUN: 26 mg/dL — ABNORMAL HIGH (ref 8–23)
CHLORIDE: 113 mmol/L — AB (ref 98–111)
CO2: 20 mmol/L — AB (ref 22–32)
CREATININE: 1.08 mg/dL — AB (ref 0.44–1.00)
Calcium: 8.4 mg/dL — ABNORMAL LOW (ref 8.9–10.3)
GFR calc non Af Amer: 51 mL/min — ABNORMAL LOW (ref >60)
GFR, EST AFRICAN AMERICAN: 59 mL/min — AB (ref >60)
Glucose, Bld: 74 mg/dL (ref 70–99)
Potassium: 4.3 mmol/L (ref 3.5–5.1)
Sodium: 140 mmol/L (ref 135–145)

## 2017-07-15 LAB — CBC
HEMATOCRIT: 35.8 % — AB (ref 36.0–46.0)
HEMOGLOBIN: 10.6 g/dL — AB (ref 12.0–15.0)
MCH: 24 pg — AB (ref 26.0–34.0)
MCHC: 29.6 g/dL — ABNORMAL LOW (ref 30.0–36.0)
MCV: 81 fL (ref 78.0–100.0)
PLATELETS: 270 10*3/uL (ref 150–400)
RBC: 4.42 MIL/uL (ref 3.87–5.11)
RDW: 17.2 % — ABNORMAL HIGH (ref 11.5–15.5)
WBC: 11.8 10*3/uL — ABNORMAL HIGH (ref 4.0–10.5)

## 2017-07-15 LAB — GLUCOSE, CAPILLARY
GLUCOSE-CAPILLARY: 91 mg/dL (ref 70–99)
GLUCOSE-CAPILLARY: 148 mg/dL — AB (ref 70–99)
GLUCOSE-CAPILLARY: 189 mg/dL — AB (ref 70–99)
Glucose-Capillary: 64 mg/dL — ABNORMAL LOW (ref 70–99)

## 2017-07-15 LAB — CK: CK TOTAL: 42 U/L (ref 38–234)

## 2017-07-15 LAB — ACTH STIMULATION, 3 TIME POINTS
CORTISOL BASE: 15 ug/dL
Cortisol, 30 Min: 21.9 ug/dL
Cortisol, 60 Min: 23.9 ug/dL

## 2017-07-15 LAB — T4, FREE: FREE T4: 0.71 ng/dL — AB (ref 0.82–1.77)

## 2017-07-15 LAB — HIV ANTIBODY (ROUTINE TESTING W REFLEX): HIV Screen 4th Generation wRfx: NONREACTIVE

## 2017-07-15 MED ORDER — ATENOLOL 50 MG PO TABS: 25.0000 mg | ORAL_TABLET | Freq: Two times a day (BID) | ORAL | Status: DC

## 2017-07-15 MED ORDER — PROPRANOLOL HCL 10 MG PO TABS
10.0000 mg | ORAL_TABLET | Freq: Three times a day (TID) | ORAL | Status: DC
  Administered 2017-07-15 – 2017-07-17 (×7): 10 mg via ORAL
  Filled 2017-07-15 (×10): qty 1

## 2017-07-15 NOTE — Progress Notes (Signed)
Hypoglycemic Event  CBG: 64  Treatment: meal tray delivered, appropriate carb amount eaten  Symptoms: none  Follow-up CBG: Time: 0937 CBG Result: 91  Possible Reasons for Event: lack of po intake while sleeping  Comments/MD notified: N/A    Norberto Sorenson

## 2017-07-15 NOTE — Progress Notes (Addendum)
PROGRESS NOTE    Donna Booker  BPZ:[id number] DOB: [birth year] DOA: 07/13/2017 PCP: Myrlene Broker, MD Brief Narrative:72 y.o. female with medical history significant of  hyperthyroidism disease on methimazole, diabetes mellitus, hypertension, relation on Eliquis, heart failure with preserved ejection fraction of 60% noted in April 2019, admission to our facility on May 10, 2017 for hyperkalemia and acute renal failure presents to our emergency department complaining of weakness, dizziness and a fall at home this evening.  Her last admission she was discharged on May 12 her hyperkalemia was thought to be secondary to acute kidney injury in the setting of diuresis with Lasix in addition to lisinopril Prill and potassium supplementation.  She improved with Kayexalate, IV fluids and holding her ACE inhibitor and loop diuretic as well as potassium supplementation.  She will fibrillation was treated with Eliquis.  She developed sinus tachycardia with SVT and was briefly placed on a diltiazem drip but her right was well controlled on her home atenolol prior to discharge.  Has severe hyperthyroidism and has been on Tapazole followed by endocrinology with recent change in her methimazole to 5 mg twice daily Dr. Elvera Lennox on 05/11/2017.  Emergency department today reveals hyperkalemia with a potassium of 5 improved to 7.0 with temporary measures.  Mild acute kidney injury, and a blood glucose of 440.  Her bicarb is 17 but there is no anion gap.  Urgency department she is getting calcium Kayexalate and insulin.  Presentation is nearly identical to the one back in May.  That was ultimately thought due to lisinopril and overdiuresis with Lasix and potassium supplementation.  Ultimately she improved by holding those medications with multiple rounds of Kayexalate.  Medication reconciliation is currently incomplete and at this point I am unsure as to which medication she is currently taking her medication bag  includes a combination of ARB, diuretic, ACE medication, at this point it is difficult to assess until her medication reconciliation has been completed.  We will continue with measures to decrease her potassium and hold any medications that are increasing her potassium.  Of concern also is a fact that she has Graves' disease.  If in fact she is not taking the above-mentioned medications then we will need to evaluate the patient for Addison's disease with a Cortrosyn stimulation test.  Which will need to be determined once we have fully evaluate her medications. In the emergency department she received calcium gluconate x2, a nebulizer x1, insulin IV 10 units x 1, Kayexalate 30 mg x 1, IV fluids, and sodium bicarb.  Patient will be admitted to the hospitalist service for further evaluation and management of severe hyperkalemia leading to weakness, dizziness and a fall at home with possible etiologies to include medication interactions versus type IV RTA versus Addison's disease.      Assessment & Plan:   Principal Problem:   Hyperkalemia Active Problems:   HTN (hypertension)   Morbid obesity (HCC)   Uncontrolled type 2 diabetes mellitus with diabetic polyneuropathy, with long-term current use of insulin (HCC)   Atrial fibrillation with RVR (HCC)   Hyperthyroidism   Acute kidney injury superimposed on CKD (HCC)   Hyperglycemia due to type 2 diabetes mellitus (HCC)   Hyponatremia   Fall at home, initial encounter   Rheumatoid arthritis (HCC)   Grief reaction   Drug-induced hyperkalemia  1] hyperkalemia-patient was admitted with the same diagnosis May 2019 and was thought to be due to a combination of ACE inhibitor, potassium replacement with Lasix.  Looking  through the last discharge summary patient was taken off ACE inhibitor and potassium and Lasix and her current home medication list also does not show that she is on any of these medications.  However she was treated with insulin glucose  Kayexalate calcium gluconate and bicarb and now her potassium is back to normal.   she denies use of NSAIDs.type 4 RTA has been considered.  Cosyntropin stimulation test has been ordered for tomorrow morning.  2] AKI on CKD stage III improved to 1.08 with IV hydration.  I will DC IV fluids.  3] hyperthyroidism on Tapazole TSH very low check T3 and T4.  Follow-up with Dr. Elvera Lennox as an outpatient as soon as possible after discharge to adjust the dose of Tapazole.  4] asymptomatic bradycardia-history of A. fib with RVR multiple times.  Currently only on beta-blocker metoprolol 50 mg twice a day.  I do not see Cardizem in her home list of medications.  She was on Cardizem at one time.  I will decrease metoprolol to 25 twice daily and monitor and adjust the dose of these medicines as needed.  Patient with history of diastolic CHF not on any diuretics at this time.  Will DC IV fluids.  5] type 2 diabetes with neuropathy uncontrolled with a hemoglobin A1c of 9.8.  She reports she was recently started on steroids for possible rheumatoid arthritis.blood sugars low.dc glipizide and levemir.ssi sensitive.    DVT prophylaxis:ELIQUIS Code Status: FULL Family Communication NONE Disposition Plan TBD Consultants: dr Jacinto Halim Procedures: NONE Antimicrobials NONE Subjective:RESTING IN BED IN nad denies chest pain sob..   Objective: Vitals:   07/15/17 0353 07/15/17 0500 07/15/17 0737 07/15/17 0821  BP: (!) 165/80  121/76   Pulse: (!) 48  (!) 54   Resp: 17  13   Temp: (!) 97.4 F (36.3 C)  (!) 97.3 F (36.3 C)   TempSrc: Oral  Oral   SpO2: 100%  97% 98%  Weight:  90.6 kg (199 lb 11.8 oz)    Height:        Intake/Output Summary (Last 24 hours) at 07/15/2017 0924 Last data filed at 07/15/2017 0700 Gross per 24 hour  Intake 2773.76 ml  Output -  Net 2773.76 ml   Filed Weights   07/14/17 0001 07/14/17 1041 07/15/17 0500  Weight: 89.8 kg (198 lb) 89 kg (196 lb 3.4 oz) 90.6 kg (199 lb 11.8 oz)     Examination:  General exam: Appears calm and comfortable  Respiratory system: Clear to auscultation. Respiratory effort normal. Cardiovascular system: S1 & S2 heard, RRR. No JVD, murmurs, rubs, gallops or clicks. No pedal edema. Gastrointestinal system: Abdomen is nondistended, soft and nontender. No organomegaly or masses felt. Normal bowel sounds heard. Central nervous system: Alert and oriented. No focal neurological deficits. Extremities: Symmetric 5 x 5 power. Skin: No rashes, lesions or ulcers Psychiatry: Judgement and insight appear normal. Mood & affect appropriate.     Data Reviewed: I have personally reviewed following labs and imaging studies  CBC: Recent Labs  Lab 07/14/17 0015 07/14/17 1450 07/15/17 0802  WBC 9.6 12.3* 11.8*  NEUTROABS 6.4 6.8  --   HGB 11.3* 9.8* 10.6*  HCT 37.8 32.5* 35.8*  MCV 78.8 78.5 81.0  PLT 310 286 270   Basic Metabolic Panel: Recent Labs  Lab 07/10/17 1206 07/14/17 0205 07/14/17 0340 07/14/17 1450 07/15/17 0802  NA 138 130*  --  141 140  K 5.2* 7.5* 7.0* 4.4 4.3  CL 108 104  --  111 113*  CO2 24 17*  --  23 20*  GLUCOSE 202* 443*  --  144* 74  BUN 29* 41*  --  33* 26*  CREATININE 1.11 1.38*  --  1.35* 1.08*  CALCIUM 9.4 8.9  --  9.0 8.4*   GFR: Estimated Creatinine Clearance: 52.9 mL/min (A) (by C-G formula based on SCr of 1.08 mg/dL (H)). Liver Function Tests: No results for input(s): AST, ALT, ALKPHOS, BILITOT, PROT, ALBUMIN in the last 168 hours. No results for input(s): LIPASE, AMYLASE in the last 168 hours. No results for input(s): AMMONIA in the last 168 hours. Coagulation Profile: No results for input(s): INR, PROTIME in the last 168 hours. Cardiac Enzymes: No results for input(s): CKTOTAL, CKMB, CKMBINDEX, TROPONINI in the last 168 hours. BNP (last 3 results) No results for input(s): PROBNP in the last 8760 hours. HbA1C: Recent Labs    07/14/17 1103  HGBA1C 9.8*   CBG: Recent Labs  Lab  07/14/17 1218 07/14/17 1701 07/14/17 2200 07/15/17 0807  GLUCAP 138* 130* 198* 64*   Lipid Profile: No results for input(s): CHOL, HDL, LDLCALC, TRIG, CHOLHDL, LDLDIRECT in the last 72 hours. Thyroid Function Tests: Recent Labs    07/14/17 0015  TSH <0.010*   Anemia Panel: No results for input(s): VITAMINB12, FOLATE, FERRITIN, TIBC, IRON, RETICCTPCT in the last 72 hours. Sepsis Labs: No results for input(s): PROCALCITON, LATICACIDVEN in the last 168 hours.  Recent Results (from the past 240 hour(s))  MRSA PCR Screening     Status: None   Collection Time: 07/14/17 11:20 AM  Result Value Ref Range Status   MRSA by PCR NEGATIVE NEGATIVE Final    Comment:        The GeneXpert MRSA Assay (FDA approved for NASAL specimens only), is one component of a comprehensive MRSA colonization surveillance program. It is not intended to diagnose MRSA infection nor to guide or monitor treatment for MRSA infections. Performed at [facility], [address], [place], [place] [address]          Radiology Studies: No results found.      Scheduled Meds: . apixaban  5 mg Oral BID  . aspirin EC  81 mg Oral Daily  . atenolol  25 mg Oral BID  . baclofen  5 mg Oral Daily   And  . baclofen  10 mg Oral QHS  . fluticasone furoate-vilanterol  1 puff Inhalation Daily  . gabapentin  300 mg Oral TID  . glipiZIDE  5 mg Oral BID AC  . insulin aspart  0-20 Units Subcutaneous TID WC  . insulin aspart  0-5 Units Subcutaneous QHS  . insulin aspart  20 Units Intravenous STAT  . insulin detemir  25 Units Subcutaneous Q2200  . methimazole  5 mg Oral BID  . pantoprazole  40 mg Oral Daily  . pravastatin  40 mg Oral q1800  . sodium chloride flush  3 mL Intravenous Q12H   Continuous Infusions: . sodium chloride 125 mL/hr at 07/15/17 0700     LOS: 1 day    Alwyn Ren, MD [facility]  If 7PM-7AM, please contact night-coverage [email or web address] Password  [facility] 07/15/2017, 9:24 AM

## 2017-07-15 NOTE — Progress Notes (Addendum)
Lab notified RN that pt STIM test has to be rescheduled due to there being no Lab Core here today. The test needs to be resceduled for Monday between 7am and 11am.  Audie Box, RN

## 2017-07-15 NOTE — Discharge Instructions (Signed)

## 2017-07-16 LAB — T3, FREE: T3, Free: 2.1 pg/mL (ref 2.0–4.4)

## 2017-07-16 LAB — GLUCOSE, CAPILLARY
GLUCOSE-CAPILLARY: 121 mg/dL — AB (ref 70–99)
GLUCOSE-CAPILLARY: 153 mg/dL — AB (ref 70–99)
Glucose-Capillary: 177 mg/dL — ABNORMAL HIGH (ref 70–99)
Glucose-Capillary: 210 mg/dL — ABNORMAL HIGH (ref 70–99)

## 2017-07-16 LAB — BASIC METABOLIC PANEL
Anion gap: 6 (ref 5–15)
BUN: 19 mg/dL (ref 8–23)
CALCIUM: 8.4 mg/dL — AB (ref 8.9–10.3)
CO2: 23 mmol/L (ref 22–32)
CREATININE: 1.03 mg/dL — AB (ref 0.44–1.00)
Chloride: 113 mmol/L — ABNORMAL HIGH (ref 98–111)
GFR calc Af Amer: >60 mL/min (ref >60)
GFR, EST NON AFRICAN AMERICAN: 54 mL/min — AB (ref >60)
GLUCOSE: 137 mg/dL — AB (ref 70–99)
Potassium: 4.3 mmol/L (ref 3.5–5.1)
Sodium: 142 mmol/L (ref 135–145)

## 2017-07-16 LAB — CBC WITH DIFFERENTIAL/PLATELET
Basophils Absolute: 0 10*3/uL (ref 0.0–0.1)
Basophils Relative: 0 %
Eosinophils Absolute: 0.2 10*3/uL (ref 0.0–0.7)
Eosinophils Relative: 2 %
HEMATOCRIT: 32.6 % — AB (ref 36.0–46.0)
Hemoglobin: 9.7 g/dL — ABNORMAL LOW (ref 12.0–15.0)
LYMPHS ABS: 7.2 10*3/uL — AB (ref 0.7–4.0)
Lymphocytes Relative: 64 %
MCH: 23.7 pg — ABNORMAL LOW (ref 26.0–34.0)
MCHC: 29.8 g/dL — AB (ref 30.0–36.0)
MCV: 79.5 fL (ref 78.0–100.0)
MONOS PCT: 9 %
Monocytes Absolute: 1 10*3/uL (ref 0.1–1.0)
NEUTROS PCT: 25 %
Neutro Abs: 2.8 10*3/uL (ref 1.7–7.7)
Platelets: 254 10*3/uL (ref 150–400)
RBC: 4.1 MIL/uL (ref 3.87–5.11)
RDW: 17.1 % — AB (ref 11.5–15.5)
WBC: 11.2 10*3/uL — AB (ref 4.0–10.5)

## 2017-07-16 MED ORDER — AMLODIPINE BESYLATE 10 MG PO TABS
10.0000 mg | ORAL_TABLET | Freq: Every day | ORAL | Status: DC
  Administered 2017-07-16 – 2017-07-17 (×2): 10 mg via ORAL
  Filled 2017-07-16 (×2): qty 1

## 2017-07-16 NOTE — Consult Note (Signed)
CARDIOLOGY CONSULT NOTE  Patient ID: Donna Booker MRN: [id number] DOB/AGE: 1945-01-31 72 y.o.  Admit date: 07/13/2017 Referring Physician  Blanchard Mane, MD Primary Physician:  Myrlene Broker, MD Reason for Consultation  A. Fib and hypertension, Hyperkalemia  HPI: Donna Booker  is a 72 y.o. female  With hyperthyroidism secondary to Graves' disease, history of atrial fibrillation with rapid ventricular when she was admitted in April 2019, stage III chronic kidney disease, moderate obesity, hypertension, hyperlipidemia, uncontrolled diabetes mellitus with stage III chronic kidney disease and history of HFpEF , been doing well until the day of admission to the hospital,felt very weak in her legs and fell to the ground.  She had been feeling poorly throughout the day.  EMS was called and she was brought to the emergency room where she was found to be hyperkalemic and also probably felt to be dehydrated.  She is admitted for further management and evaluation.  However due to recurrent episodes of hyperkalemia, I was consulted to see the patient for management of hypertension and also paroxysmal atrial fibrillation.  Patient states that she is feeling better but still generally weak.  She denies any recent palpitations or dyspnea, denies any chest pain or cramping in the legs suggestive of claudication.  Denies any recent weight changes, denies recent heat or cold intolerance.  Past Medical History:  Diagnosis Date  . Diabetes mellitus   . Graves disease   . Hypertension   . Hyperthyroidism      Past Surgical History:  Procedure Laterality Date  . BREAST SURGERY       Family History  Problem Relation Age of Onset  . Diabetes Mellitus II Mother   . Diabetes Mellitus II Father   . Hypertension Father   . Hyperlipidemia Father      Social History: Social History   Socioeconomic History  . Marital status: Married    Spouse name: Not on file  . Number of children: Not  on file  . Years of education: Not on file  . Highest education level: Not on file  Occupational History  . Not on file  Social Needs  . Financial resource strain: Not on file  . Food insecurity:    Worry: Not on file    Inability: Not on file  . Transportation needs:    Medical: Not on file    Non-medical: Not on file  Tobacco Use  . Smoking status: Never Smoker  . Smokeless tobacco: Never Used  Substance and Sexual Activity  . Alcohol use: No  . Drug use: No  . Sexual activity: Not on file  Lifestyle  . Physical activity:    Days per week: Not on file    Minutes per session: Not on file  . Stress: Not on file  Relationships  . Social connections:    Talks on phone: Not on file    Gets together: Not on file    Attends religious service: Not on file    Active member of club or organization: Not on file    Attends meetings of clubs or organizations: Not on file    Relationship status: Not on file  . Intimate partner violence:    Fear of current or ex partner: Not on file    Emotionally abused: Not on file    Physically abused: Not on file    Forced sexual activity: Not on file  Other Topics Concern  . Not on file  Social History Narrative  .  Not on file     Medications Prior to Admission  Medication Sig Dispense Refill Last Dose  . apixaban (ELIQUIS) 5 MG TABS tablet Take 1 tablet (5 mg total) by mouth 2 (two) times daily. 60 tablet 0 07/13/2017 at 1015  . aspirin EC 81 MG tablet Take 81 mg by mouth daily.   07/13/2017 at Unknown time  . atenolol (TENORMIN) 50 MG tablet Take 1 tablet (50 mg total) by mouth 2 (two) times daily. 60 tablet 0 07/13/2017 at 1015  . baclofen (LIORESAL) 10 MG tablet Take 1 tablet (10 mg total) by mouth 3 (three) times daily. (Patient taking differently: Take 5-10 mg by mouth See admin instructions. Take 5 mg during the day and 10 mg at bedtime) 60 each 0 07/11/2017  . diclofenac sodium (VOLTAREN) 1 % GEL Apply 2 g topically 4 (four) times daily  as needed (pain).   06/23/2017 at prn  . esomeprazole (NEXIUM) 40 MG capsule Take 40 mg by mouth daily before breakfast.   07/13/2017 at Unknown time  . fluticasone furoate-vilanterol (BREO ELLIPTA) 200-25 MCG/INH AEPB Inhale 1 puff into the lungs daily. 60 each 3 07/13/2017 at Unknown time  . gabapentin (NEURONTIN) 300 MG capsule Take 1 capsule (300 mg total) by mouth 3 (three) times daily. 270 capsule 1 07/13/2017 at Unknown time  . glipiZIDE (GLUCOTROL) 5 MG tablet Take 1 tablet (5 mg total) by mouth 2 (two) times daily before a meal. NEED ANNUAL VISIT FOR REFILLS 60 tablet 0 07/13/2017 at Unknown time  . insulin detemir (LEVEMIR) 100 UNIT/ML injection Inject 0.25 mLs (25 Units total) into the skin daily at 10 pm. 10 mL 1 07/12/2017 at Unknown time  . metFORMIN (GLUCOPHAGE) 1000 MG tablet Take 1 tablet (1,000 mg total) by mouth 2 (two) times daily with a meal. 180 tablet 3 07/13/2017 at Unknown time  . methimazole (TAPAZOLE) 5 MG tablet Take 2 tablets (10 mg total) by mouth 2 (two) times daily. 120 tablet 5 07/13/2017 at Unknown time  . pravastatin (PRAVACHOL) 40 MG tablet Take 1 tablet (40 mg total) by mouth daily at 6 PM. 30 tablet 1 07/13/2017 at Unknown time  . predniSONE (DELTASONE) 20 MG tablet Take 2 tablets (40 mg total) by mouth daily with breakfast. 10 tablet 0 07/13/2017 at Unknown time  . loratadine (CLARITIN) 10 MG tablet Take 1 tablet (10 mg total) by mouth at bedtime. (Patient not taking: Reported on 07/14/2017)   Not Taking at Unknown time   Review of Systems  Constitutional: Positive for malaise/fatigue. Negative for chills, fever and weight loss.  HENT: Negative.   Eyes: Negative.   Respiratory: Positive for shortness of breath. Negative for hemoptysis, sputum production and wheezing.   Cardiovascular: Positive for chest pain (occasional chest tightness, burning sensation in the chest especially at night.). Negative for palpitations, orthopnea, claudication, leg swelling and PND.   Gastrointestinal: Positive for heartburn. Negative for abdominal pain, blood in stool, diarrhea, nausea and vomiting.  Genitourinary: Negative.   Musculoskeletal: Positive for back pain and joint pain.  Skin: Negative.   Neurological: Positive for weakness. Negative for seizures and loss of consciousness.  Psychiatric/Behavioral: Negative.     Physical Exam: Blood pressure (!) 187/74, pulse (!) 52, temperature 97.8 F (36.6 C), temperature source Oral, resp. rate 16, height 5\' 4"  (1.626 m), weight 90.6 kg (199 lb 11.8 oz), SpO2 98 %. Body mass index is 34.28 kg/m.  Physical Exam  Constitutional: She appears well-developed and well-nourished. No distress.  Mildly  obese  HENT:  Head: Atraumatic.  Eyes: Conjunctivae are normal.  Neck: Neck supple. No JVD present. No tracheal deviation present. No thyromegaly present.  Cardiovascular: Normal rate, regular rhythm, normal heart sounds and intact distal pulses.  Pulmonary/Chest: Effort normal and breath sounds normal.  Abdominal: Soft. Bowel sounds are normal.  Musculoskeletal: Normal range of motion. She exhibits no edema, tenderness or deformity.  Neurological: She is alert.  Skin: Skin is warm. She is not diaphoretic.  Psychiatric: She has a normal mood and affect.   Labs:   Lab Results  Component Value Date   WBC 11.2 (H) 07/16/2017   HGB 9.7 (L) 07/16/2017   HCT 32.6 (L) 07/16/2017   MCV 79.5 07/16/2017   PLT 254 07/16/2017    BMP Latest Ref Rng & Units 07/16/2017 07/15/2017 07/14/2017  Glucose 70 - 99 mg/dL 735(H) 74 299(M)  BUN 8 - 23 mg/dL 19 42(A) 83(M)  Creatinine 0.44 - 1.00 mg/dL 1.96(Q) 2.29(N) 9.89(Q)  BUN/Creat Ratio 6 - 22 (calc) - - -  Sodium 135 - 145 mmol/L 142 140 141  Potassium 3.5 - 5.1 mmol/L 4.3 4.3 4.4  Chloride 98 - 111 mmol/L 113(H) 113(H) 111  CO2 22 - 32 mmol/L 23 20(L) 23  Calcium 8.9 - 10.3 mg/dL 1.1(H) 4.1(D) 9.0   Lipid Panel     Component Value Date/Time   CHOL 248 (H) 10/20/2015 0848    TRIG 222.0 (H) 10/20/2015 0848   HDL 38.70 (L) 10/20/2015 0848   CHOLHDL 6 10/20/2015 0848   VLDL 44.4 (H) 10/20/2015 0848   LDLCALC 55 09/01/2014 1626    BNP (last 3 results) Recent Labs    03/04/17 0934 04/20/17 2322  BNP 216.3* 1,050.9*    HEMOGLOBIN A1C Lab Results  Component Value Date   HGBA1C 9.8 (H) 07/14/2017   MPG 234.56 07/14/2017    Cardiac Panel (last 3 results) Recent Labs    05/11/17 0639 05/11/17 2147 05/12/17 0427 05/12/17 1108 07/06/17 1052 07/15/17 1008  CKTOTAL 35*  --   --   --  50 42  TROPONINI  --  <0.03 <0.03 <0.03  --   --     Lab Results  Component Value Date   CKTOTAL 42 07/15/2017   TROPONINI <0.03 05/12/2017     TSH Recent Labs    05/10/17 1146 07/06/17 1052 07/14/17 0015  TSH <0.01 Repeated and verified X2.* <0.01* <0.010*     Radiology:  Scheduled Meds: . amLODipine  10 mg Oral Daily  . apixaban  5 mg Oral BID  . aspirin EC  81 mg Oral Daily  . baclofen  5 mg Oral Daily   And  . baclofen  10 mg Oral QHS  . fluticasone furoate-vilanterol  1 puff Inhalation Daily  . gabapentin  300 mg Oral TID  . insulin aspart  0-20 Units Subcutaneous TID WC  . insulin aspart  0-5 Units Subcutaneous QHS  . methimazole  5 mg Oral BID  . pantoprazole  40 mg Oral Daily  . pravastatin  40 mg Oral q1800  . propranolol  10 mg Oral TID  . sodium chloride flush  3 mL Intravenous Q12H   Continuous Infusions: . sodium chloride 125 mL/hr at 07/16/17 0433   PRN Meds:.acetaminophen **OR** acetaminophen, diclofenac sodium, HYDROcodone-acetaminophen, ondansetron **OR** ondansetron (ZOFRAN) IV, traZODone  CARDIAC STUDIES:  EKG 07/14/2017: Sinus bradycardia at rate of 59 bpm, left axis deviation, early repolarization.  No evidence of ischemia.  LVH.  Echocardiogram 04/16/2017:  - Left  ventricle: The cavity size was normal. There was mildconcentric hypertrophy. Systolic function was normal. Theestimated ejection fraction was in the range  of 60% to 65%. Wallmotion was normal; there were no regional wall motionabnormalities. The study was not technically sufficient to allowevaluation of LV diastolic dysfunction due to atrialfibrillation. - Mitral valve: Calcified annulus. There was mild regurgitation. - Left atrium: The atrium was moderately dilated. - Right atrium: The atrium was moderately dilated. - Tricuspid valve: There was moderate regurgitation. - Pulmonic valve: There was trivial regurgitation. - Pulmonary arteries: PA peak pressure: 48 mm Hg (S), consistentwith moderate pulmonary hypertension.  Out patient evaluation:  Renal artery duplex 05/29/2017: No evidence of renal artery stenosis.  ASSESSMENT AND PLAN:  1.  Paroxysmal atrial fibrillation, maintained sinus rhythm, no recurrence since April 2019. CHA2DS2-VASCScore: Risk Score  5,   2.  Hyperkalemia 3.  Essential hypertension 4.  Stage III chronic kidney disease probably related to uncontrolled diabetes mellitus 5.  Mild obesity 6.  Chronic diastolic heart failure 7.  Graves' disease, TSH continues to be markedly reduced suggestive of uncontrolled hyperthyroidism. 8.  Mixed hyperlipidemia 9.  Chest pain, appears to be related to GERD, needs stress testing at some point.   Recommendation: Patient maintain sinus rhythm.  Agree with changing from metoprolol to propranolol in view of hyperthyroid state.  Although bradycardia, totally asymptomatic and no evidence of heart block on telemetry. Her generalized weakness may be related to hyperthyroid state as well. Consider preclinical thyrotoxicosis.   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  Agree with checking ACTH and also a.m. serum cortisol levels, labs reviewed the appear to be within normal limits.  I discussed with her endocrinologist Dr. Josem Kaufmann who will follow the patient in the outpatient basis. We will also obtain  serum aldosterone and renin to evaluate for hypertension and also for hyperkalemia, labs reviewed normal limits.  I suspect it is also a combination of uncontrolled diabetes and patient has been eating fruits excessively meals every day.  Blood pressure is elevated today, will add amlodipine 5 mg daily.  Renal function has remained stable. Out patient renal duplex did not reveal renal artery stenosis. Discussed regarding weight loss and also change in habits with regard to diabetes.  No clinical evidence of obvious heart failure.  I will continue to follow alongside with you.   Yates Decamp, MD 07/16/2017, 9:02 AM [facility] Pager: [phone] Office: [phone] If no answer Cell [phone]

## 2017-07-16 NOTE — Progress Notes (Addendum)
Inpatient Diabetes Program Recommendations  AACE/ADA: New Consensus Statement on Inpatient Glycemic Control (2015)  Target Ranges:  Prepandial:   less than 140 mg/dL      Peak postprandial:   less than 180 mg/dL (1-2 hours)      Critically ill patients:  140 - 180 mg/dL   Results for TANESSA, TIDD (MRN [id number]) as of 07/16/2017 08:13  Ref. Range 07/15/2017 08:07 07/15/2017 09:37 07/15/2017 11:48 07/15/2017 16:29  Glucose-Capillary Latest Ref Range: 70 - 99 mg/dL 64 (L) 91 148 (H) 189 (H)   Results for RINOA, GARRAMONE (MRN [id number]) as of 07/16/2017 08:13  Ref. Range 07/16/2017 07:47  Glucose-Capillary Latest Ref Range: 70 - 99 mg/dL 121 (H)   Results for ZARRIAH, STARKEL (MRN [id number]) as of 07/16/2017 08:13  Ref. Range 07/14/2017 11:03  Hemoglobin A1C Latest Ref Range: 4.8 - 5.6 % 9.8 (H)    Admit with: Hyperkalemia/ Acute on Chronic Kidney Disease  History: DM, CHF, CKD, Graves disease (recent diagnosis)  Home DM Meds: Levemir 25 units QHS       Glipizide 5 mg BID  Current Insulin Orders: Novolog Resistant Correction Scale/ SSI (0-20 units) TID AC + HS       Note Levemir discontinued after patient had Hypoglycemia yesterday AM.  Currently only receiving Novolog SSI.  Patient saw her Endocrinologist (Dr. Philemon Kingdom with [facility]) on 05/10/17.  At that visit, pt's A1c was noted to be 7.9%.  Patient was instructed to continue Levemir 25 units QHS and continue Glipizide 5 mg BID.  Dr. Cruzita Lederer also requested that patient check her CBGs at least 1 time per day.  CBG this AM OK at 121 mg/dl.  Will try to speak with patient today to discuss her home DM regimen.    Addendum 2pm- Met with patient earlier this afternoon.  Pt confirmed she saw her ENDO Dr. Cruzita Lederer back in May and was instructed to take Levemir 25 units QHS and Glipizide 5 mg BID.  Has been under a lot of stress the past month.  Her son passed away back in 06/19/22 and she has been dealing with a lot of pain  and grief from this loss.  Has been eating a large amount of fruit at home and told me she plans to greatly reduce or completely eliminate fruit from her diet now.  Avoids beverages with sugar.  Asked me about carb-conscious snacks for home.  Discussed food items like peanut butter and yogurt that have protein and will help her stay fuller longer.  Pt wants to avoid cheese because of the excess sodium.  Discussed current A1c of 9.8% with pt.  Pt stated she has not been taking good care of herself b/c of the loss of her son and knows she needs to "do better for herself".  Did not have any further questions for me.       --Will follow patient during hospitalization--  Wyn Quaker RN, MSN, CDE Diabetes Coordinator Inpatient Glycemic Control Team Team Pager: [phone] (8a-5p)

## 2017-07-16 NOTE — Progress Notes (Signed)
PROGRESS NOTE    Donna Booker  TZG:[id number] DOB: [birth year] DOA: 07/13/2017 PCP: Myrlene Broker, MD  Brief Narrative:72 y.o.femalewith medical history significant of hyperthyroidism disease on methimazole, diabetes mellitus, hypertension, relation on Eliquis, heart failure with preserved ejection fraction of 60% noted in April 2019, admission to our facility on May 10, 2017 for hyperkalemia and acute renal failure presents to our emergency department complaining of weakness, dizziness and a fall at home this evening.  Her last admission she was discharged on May 12 her hyperkalemia was thought to be secondary to acute kidney injury in the setting of diuresis with Lasix in addition to lisinopril Prill and potassium supplementation. She improved with Kayexalate, IV fluids and holding her ACE inhibitor and loop diuretic as well as potassium supplementation. She will fibrillation was treated with Eliquis. She developed sinus tachycardia with SVT and was briefly placed on a diltiazem drip but her right was well controlled on her home atenolol prior to discharge. Has severe hyperthyroidism and has been on Tapazole followed by endocrinology with recent change in her methimazole to 5 mg twice daily Dr. Elvera Lennox on 05/11/2017. Emergency department today reveals hyperkalemia with a potassium of 5 improved to 7.0 with temporary measures. Mild acute kidney injury, and a blood glucose of 440. Her bicarb is 17 but there is no anion gap. Urgency department she is getting calcium Kayexalate and insulin. Presentation is nearly identical to the one back in May. That was ultimately thought due to lisinopril and overdiuresis with Lasix and potassium supplementation. Ultimately she improved by holding those medications with multiple rounds of Kayexalate. Medication reconciliation is currently incomplete and at this point I am unsure as to which medication she is currently taking her medication bag  includes a combination of ARB,diuretic,ACE medication,at this point it is difficult to assess until her medication reconciliation has been completed. We will continue with measures to decrease her potassium and hold any medications that are increasing her potassium. Of concern also is a fact that she has Graves' disease. If in fact she is not taking the above-mentioned medications then we will need to evaluate the patient for Addison's disease with a Cortrosyn stimulation test. Which will need to be determined once we have fully evaluate her medications. In the emergency department she received calcium gluconate x2, a nebulizer x1, insulin IV 10 units x 1, Kayexalate 30 mg x 1, IV fluids, and sodium bicarb. Patient will be admitted to the hospitalist service for further evaluation and management of severe hyperkalemia leading to weakness, dizziness and a fall at home with possible etiologies to include medication interactions versus type IV RTA versus Addison's disease.      Assessment & Plan:   Principal Problem:   Hyperkalemia Active Problems:   HTN (hypertension)   Morbid obesity (HCC)   Uncontrolled type 2 diabetes mellitus with diabetic polyneuropathy, with long-term current use of insulin (HCC)   Atrial fibrillation with RVR (HCC)   Hyperthyroidism   Acute kidney injury superimposed on CKD (HCC)   Hyperglycemia due to type 2 diabetes mellitus (HCC)   Hyponatremia   Fall at home, initial encounter   Rheumatoid arthritis (HCC)   Grief reaction   Drug-induced hyperkalemia 1] hyperkalemia/TYPE 4 RTA-patient was admitted with the same diagnosis May 2019 and was thought to be due to a combination of ACE inhibitor, potassium replacement with Lasix.  Looking through the last discharge summary patient was taken off ACE inhibitor and potassium and Lasix and her current home medication list  also does not show that she is on any of these medications.  However she was treated with insulin  glucose Kayexalate calcium gluconate and bicarb and now her potassium is back to normal. Cosyntropin stimulation test APPEARS TO BE NORMAL.   2] AKI on CKD stage III improved  with IV hydration.  I will DC IV fluids.  3] hyperthyroidism on Tapazole TSH very low ,MILDLY LOW t4.  Follow-up with Dr. Elvera Lennox as an outpatient as soon as possible after discharge to adjust the dose of Tapazole.  4] asymptomatic bradycardia-history of A. fib with RVR multiple times.inderal started yesterday.  5] type 2 diabetes with neuropathy uncontrolled with a hemoglobin A1c of 9.8.  She reports she was recently started on steroids for possible rheumatoid arthritis.blood sugars low.dc glipizide and levemir.ssi sensitive.       DVT prophylaxis:eliquis  Code Status: full Family Communication:none Disposition Plan:hope to dc tomorrow  Consultants: cardiology  Procedures:none Antimicrobials none Subjective: Feels well,no chest pain sob  Objective: Vitals:   07/16/17 0029 07/16/17 0433 07/16/17 0746 07/16/17 0901  BP: (!) 149/96 (!) 144/65 (!) 187/74   Pulse: (!) 59 (!) 50 (!) 52   Resp: 11 (!) 9 16   Temp: 97.8 F (36.6 C) 97.8 F (36.6 C) 97.8 F (36.6 C)   TempSrc: Oral Oral Oral   SpO2: 98% 98%  100%  Weight:      Height:        Intake/Output Summary (Last 24 hours) at 07/16/2017 0929 Last data filed at 07/16/2017 0900 Gross per 24 hour  Intake 2442.37 ml  Output -  Net 2442.37 ml   Filed Weights   07/14/17 0001 07/14/17 1041 07/15/17 0500  Weight: 89.8 kg (198 lb) 89 kg (196 lb 3.4 oz) 90.6 kg (199 lb 11.8 oz)    Examination:  General exam: Appears calm and comfortable  Respiratory system: Clear to auscultation. Respiratory effort normal. Cardiovascular system: S1 & S2 heard, RRR. No JVD, murmurs, rubs, gallops or clicks. No pedal edema. Gastrointestinal system: Abdomen is nondistended, soft and nontender. No organomegaly or masses felt. Normal bowel sounds heard. Central  nervous system: Alert and oriented. No focal neurological deficits. Extremities: Symmetric 5 x 5 power. Skin: No rashes, lesions or ulcers Psychiatry: Judgement and insight appear normal. Mood & affect appropriate.     Data Reviewed: I have personally reviewed following labs and imaging studies  CBC: Recent Labs  Lab 07/14/17 0015 07/14/17 1450 07/15/17 0802 07/16/17 0241  WBC 9.6 12.3* 11.8* 11.2*  NEUTROABS 6.4 6.8  --  2.8  HGB 11.3* 9.8* 10.6* 9.7*  HCT 37.8 32.5* 35.8* 32.6*  MCV 78.8 78.5 81.0 79.5  PLT 310 286 270 254   Basic Metabolic Panel: Recent Labs  Lab 07/10/17 1206 07/14/17 0205 07/14/17 0340 07/14/17 1450 07/15/17 0802 07/16/17 0241  NA 138 130*  --  141 140 142  K 5.2* 7.5* 7.0* 4.4 4.3 4.3  CL 108 104  --  111 113* 113*  CO2 24 17*  --  23 20* 23  GLUCOSE 202* 443*  --  144* 74 137*  BUN 29* 41*  --  33* 26* 19  CREATININE 1.11 1.38*  --  1.35* 1.08* 1.03*  CALCIUM 9.4 8.9  --  9.0 8.4* 8.4*   GFR: Estimated Creatinine Clearance: 55.4 mL/min (A) (by C-G formula based on SCr of 1.03 mg/dL (H)). Liver Function Tests: No results for input(s): AST, ALT, ALKPHOS, BILITOT, PROT, ALBUMIN in the last 168 hours. No results  for input(s): LIPASE, AMYLASE in the last 168 hours. No results for input(s): AMMONIA in the last 168 hours. Coagulation Profile: No results for input(s): INR, PROTIME in the last 168 hours. Cardiac Enzymes: Recent Labs  Lab 07/15/17 1008  CKTOTAL 42   BNP (last 3 results) No results for input(s): PROBNP in the last 8760 hours. HbA1C: Recent Labs    07/14/17 1103  HGBA1C 9.8*   CBG: Recent Labs  Lab 07/15/17 0807 07/15/17 0937 07/15/17 1148 07/15/17 1629 07/16/17 0747  GLUCAP 64* 91 148* 189* 121*   Lipid Profile: No results for input(s): CHOL, HDL, LDLCALC, TRIG, CHOLHDL, LDLDIRECT in the last 72 hours. Thyroid Function Tests: Recent Labs    07/14/17 0015 07/15/17 1008  TSH <0.010*  --   FREET4  --  0.71*    Anemia Panel: No results for input(s): VITAMINB12, FOLATE, FERRITIN, TIBC, IRON, RETICCTPCT in the last 72 hours. Sepsis Labs: No results for input(s): PROCALCITON, LATICACIDVEN in the last 168 hours.  Recent Results (from the past 240 hour(s))  MRSA PCR Screening     Status: None   Collection Time: 07/14/17 11:20 AM  Result Value Ref Range Status   MRSA by PCR NEGATIVE NEGATIVE Final    Comment:        The GeneXpert MRSA Assay (FDA approved for NASAL specimens only), is one component of a comprehensive MRSA colonization surveillance program. It is not intended to diagnose MRSA infection nor to guide or monitor treatment for MRSA infections. Performed at [facility], [address] [place], [place] [address]          Radiology Studies: No results found.      Scheduled Meds: . amLODipine  10 mg Oral Daily  . apixaban  5 mg Oral BID  . aspirin EC  81 mg Oral Daily  . baclofen  5 mg Oral Daily   And  . baclofen  10 mg Oral QHS  . fluticasone furoate-vilanterol  1 puff Inhalation Daily  . gabapentin  300 mg Oral TID  . insulin aspart  0-20 Units Subcutaneous TID WC  . insulin aspart  0-5 Units Subcutaneous QHS  . methimazole  5 mg Oral BID  . pantoprazole  40 mg Oral Daily  . pravastatin  40 mg Oral q1800  . propranolol  10 mg Oral TID  . sodium chloride flush  3 mL Intravenous Q12H   Continuous Infusions: . sodium chloride 125 mL/hr at 07/16/17 0433     LOS: 2 days       Alwyn Ren, MD [facility] If 7PM-7AM, please contact night-coverage [email or web address] Password [facility] 07/16/2017, 9:29 AM

## 2017-07-16 NOTE — Progress Notes (Signed)
Spiritual Care Consult Acknowledged. Advanced Directive Education offered. Patient was guided through filling out the Advanced Directive. Chaplain will indicate in chart when Advanced Directive is Complete.

## 2017-07-17 LAB — GLUCOSE, CAPILLARY
Glucose-Capillary: 156 mg/dL — ABNORMAL HIGH (ref 70–99)
Glucose-Capillary: 223 mg/dL — ABNORMAL HIGH (ref 70–99)

## 2017-07-17 MED ORDER — AMLODIPINE BESYLATE 10 MG PO TABS: 10.0000 mg | ORAL_TABLET | Freq: Every day | ORAL | 0 refills | Status: DC

## 2017-07-17 MED ORDER — PROPRANOLOL HCL 10 MG PO TABS: 10.0000 mg | ORAL_TABLET | Freq: Three times a day (TID) | ORAL | 0 refills | Status: DC

## 2017-07-17 MED ORDER — PROPRANOLOL HCL 10 MG PO TABS: 20.0000 mg | ORAL_TABLET | Freq: Three times a day (TID) | ORAL | 1 refills | Status: DC

## 2017-07-17 NOTE — Progress Notes (Signed)
Hyperkalemia resolved. Likely possibility is type IV RTA. Fu w/Dr Elvera Lennox on discharge.  Asymptomatic bradycardia. Continue propranolol and eliquis. Will arrange outpatient follow up.  Elder Negus, MD [facility] Pager: [phone] Office: [phone] If no answer Cell [phone]

## 2017-07-17 NOTE — Discharge Summary (Signed)
Physician Discharge Summary  Donna Booker YJW:[id number] DOB: 1945-08-17 DOA: 07/13/2017  PCP: Myrlene Broker, MD  Admit date: 07/13/2017 Discharge date: 07/17/2017  Admitted From: home Disposition:  home  Recommendations for Outpatient Follow-up:  1. Follow up with PCP in 1-2 weeks 2. Please obtain BMP/CBC in one week 3. Follow-up with cardiology Dr. Rosemary Holms 4. Follow-up with endocrinology Dr. Elvera Lennox  Home Health:none Equipment/Devices none Discharge Condition stable CODE STATUS full Diet recommendation: cardiac Brief/Interim Summary:72 y.o.femalewith medical history significant ofhyperthyroidismdisease on methimazole, diabetes mellitus, hypertension, relation on Eliquis, heart failure with preserved ejection fraction of 60% noted in April 2019, admission to our facility on May 10, 2017 for hyperkalemia and acute renal failure presents to our emergency department complaining of weakness, dizziness and a fall at home this evening.  Her last admission she was discharged on May 12 her hyperkalemia was thought to be secondary to acute kidney injury in the setting of diuresis with Lasix in addition to lisinopril Prill and potassium supplementation. She improved with Kayexalate, IV fluids and holding her ACE inhibitor and loop diuretic as well as potassium supplementation. She will fibrillation was treated with Eliquis. She developed sinus tachycardia with SVT and was briefly placed on a diltiazem drip but her right was well controlled on her home atenolol prior to discharge. Has severe hyperthyroidism and has been on Tapazole followed by endocrinology with recent change in her methimazole to 5 mg twice daily Dr. Elvera Lennox on 05/11/2017. Emergency department today reveals hyperkalemia with a potassium of 5 improved to 7.0 with temporary measures. Mild acute kidney injury, and a blood glucose of 440. Her bicarb is 17 but there is no anion gap. Urgency department she is getting  calcium Kayexalate and insulin. Presentation is nearly identical to the one back in May. That was ultimately thought due to lisinopril and overdiuresis with Lasix and potassium supplementation. Ultimately she improved by holding those medications with multiple rounds of Kayexalate. Medication reconciliation is currently incomplete and at this point I am unsure as to which medication she is currently taking her medication bag includes a combination of ARB,diuretic,ACE medication,at this point it is difficult to assess until her medication reconciliation has been completed. We will continue with measures to decrease her potassium and hold any medications that are increasing her potassium. Of concern also is a fact that she has Graves' disease. If in fact she is not taking the above-mentioned medications then we will need to evaluate the patient for Addison's disease with a Cortrosyn stimulation test. Which will need to be determined once we have fully evaluate her medications. In the emergency department she received calcium gluconate x2, a nebulizer x1, insulin IV 10 units x 1, Kayexalate 30 mg x 1, IV fluids, and sodium bicarb. Patient will be admitted to the hospitalist service for further evaluation and management of severe hyperkalemia leading to weakness, dizziness and a fall at home with possible etiologies to include medication interactions versus type IV RTA versus Addison's disease.       Discharge Diagnoses:  Principal Problem:   Hyperkalemia Active Problems:   HTN (hypertension)   Morbid obesity (HCC)   Uncontrolled type 2 diabetes mellitus with diabetic polyneuropathy, with long-term current use of insulin (HCC)   Atrial fibrillation with RVR (HCC)   Hyperthyroidism   Acute kidney injury superimposed on CKD (HCC)   Hyperglycemia due to type 2 diabetes mellitus (HCC)   Hyponatremia   Fall at home, initial encounter   Rheumatoid arthritis (HCC)   Grief  reaction    Drug-induced hyperkalemia   1]hyperkalemia/TYPE 4 RTA-patient was admitted with the same diagnosis May 2019 and was thought to be due to a combination of ACE inhibitor, potassium replacement with Lasix. Looking through the last discharge summary patient was taken off ACE inhibitor and potassium and Lasix and her current home medication list also does not show that she is on any of these medications. However she was treated with insulin glucose Kayexalate calcium gluconate and bicarb and now her potassium is back to normal.Cosyntropin stimulation test APPEARS TO BE NORMAL.  She was started on Norvasc 10 mg daily, propranolol 10 mg 3 times a day for better blood pressure control.  These are the only 2 medications he will be discharged on which is new.    2]AKI on CKD stage III improved  with IV hydration.  3]hyperthyroidism on Tapazole TSH very low ,MILDLY LOW t4. Follow-up with Dr. Linus Galas an outpatient as soon as possible after discharge to adjust the dose of Tapazole.  4]asymptomatic bradycardia-history of A. fib with RVR multiple times.inderal started yesterday.  Continue Eliquis.  5]type 2 diabetes with neuropathy uncontrolled with a hemoglobin A1c of 9.8.  Restart Levemir and glipizide upon discharge.    Discharge Instructions   Allergies as of 07/17/2017      Reactions   Tramadol Nausea And Vomiting      Medication List    STOP taking these medications   atenolol 50 MG tablet Commonly known as:  TENORMIN   loratadine 10 MG tablet Commonly known as:  CLARITIN   metFORMIN 1000 MG tablet Commonly known as:  GLUCOPHAGE   predniSONE 20 MG tablet Commonly known as:  DELTASONE     TAKE these medications   amLODipine 10 MG tablet Commonly known as:  NORVASC Take 1 tablet (10 mg total) by mouth daily. Start taking on:  07/18/2017   apixaban 5 MG Tabs tablet Commonly known as:  ELIQUIS Take 1 tablet (5 mg total) by mouth 2 (two) times daily.   aspirin EC 81  MG tablet Take 81 mg by mouth daily.   baclofen 10 MG tablet Commonly known as:  LIORESAL Take 1 tablet (10 mg total) by mouth 3 (three) times daily. What changed:    how much to take  when to take this  additional instructions   diclofenac sodium 1 % Gel Commonly known as:  VOLTAREN Apply 2 g topically 4 (four) times daily as needed (pain).   esomeprazole 40 MG capsule Commonly known as:  NEXIUM Take 40 mg by mouth daily before breakfast.   fluticasone furoate-vilanterol 200-25 MCG/INH Aepb Commonly known as:  BREO ELLIPTA Inhale 1 puff into the lungs daily.   gabapentin 300 MG capsule Commonly known as:  NEURONTIN Take 1 capsule (300 mg total) by mouth 3 (three) times daily.   glipiZIDE 5 MG tablet Commonly known as:  GLUCOTROL Take 1 tablet (5 mg total) by mouth 2 (two) times daily before a meal. NEED ANNUAL VISIT FOR REFILLS   insulin detemir 100 UNIT/ML injection Commonly known as:  LEVEMIR Inject 0.25 mLs (25 Units total) into the skin daily at 10 pm.   methimazole 5 MG tablet Commonly known as:  TAPAZOLE Take 2 tablets (10 mg total) by mouth 2 (two) times daily.   pravastatin 40 MG tablet Commonly known as:  PRAVACHOL Take 1 tablet (40 mg total) by mouth daily at 6 PM.   propranolol 10 MG tablet Commonly known as:  INDERAL Take 1 tablet (10  mg total) by mouth 3 (three) times daily.      Follow-up Information    Myrlene Broker, MD Follow up.   Specialty:  Internal Medicine Contact information: [address] [place] [address] [phone]        Elder Negus, MD Follow up.   Specialty:  Cardiology Contact information: [address] [place] [address] [phone]        Carlus Pavlov, MD Follow up.   Specialty:  Internal Medicine Contact information: [address] [place] [address] [phone]          Allergies  Allergen Reactions  . Tramadol Nausea And Vomiting     Consultations: [facility] cardiology   Procedures/Studies:  No results found. (Echo, Carotid, EGD, Colonoscopy, ERCP)    Subjective:   Discharge Exam: Vitals:   07/17/17 0710 07/17/17 1005  BP: (!) 158/57   Pulse:  71  Resp: 20   Temp: 97.8 F (36.6 C)   SpO2:     Vitals:   07/16/17 2318 07/17/17 0402 07/17/17 0710 07/17/17 1005  BP: (!) 146/71 (!) 127/56 (!) 158/57   Pulse: (!) 58 (!) 51  71  Resp: 11 12 20    Temp: 98 F (36.7 C) (!) 97.5 F (36.4 C) 97.8 F (36.6 C)   TempSrc: Oral Oral Oral   SpO2: 98% 100%    Weight:  89.8 kg (197 lb 15.6 oz)    Height:        General: Pt is alert, awake, not in acute distress Cardiovascular: RRR, S1/S2 +, no rubs, no gallops Respiratory: CTA bilaterally, no wheezing, no rhonchi Abdominal: Soft, NT, ND, bowel sounds + Extremities: no edema, no cyanosis    The results of significant diagnostics from this hospitalization (including imaging, microbiology, ancillary and laboratory) are listed below for reference.     Microbiology: Recent Results (from the past 240 hour(s))  MRSA PCR Screening     Status: None   Collection Time: 07/14/17 11:20 AM  Result Value Ref Range Status   MRSA by PCR NEGATIVE NEGATIVE Final    Comment:        The GeneXpert MRSA Assay (FDA approved for NASAL specimens only), is one component of a comprehensive MRSA colonization surveillance program. It is not intended to diagnose MRSA infection nor to guide or monitor treatment for MRSA infections. Performed at [facility], [address] [place], [place]      Labs: BNP (last 3 results) Recent Labs    03/04/17 0934 04/20/17 2322  BNP 216.3* 1,050.9*   Basic Metabolic Panel: Recent Labs  Lab 07/10/17 1206 07/14/17 0205 07/14/17 0340 07/14/17 1450 07/15/17 0802 07/16/17 0241  NA 138 130*  --  141 140 142  K 5.2* 7.5* 7.0* 4.4 4.3 4.3  CL 108 104  --  111 113* 113*  CO2 24 17*  --  23 20* 23  GLUCOSE  202* 443*  --  144* 74 137*  BUN 29* 41*  --  33* 26* 19  CREATININE 1.11 1.38*  --  1.35* 1.08* 1.03*  CALCIUM 9.4 8.9  --  9.0 8.4* 8.4*   Liver Function Tests: No results for input(s): AST, ALT, ALKPHOS, BILITOT, PROT, ALBUMIN in the last 168 hours. No results for input(s): LIPASE, AMYLASE in the last 168 hours. No results for input(s): AMMONIA in the last 168 hours. CBC: Recent Labs  Lab 07/14/17 0015 07/14/17 1450 07/15/17 0802 07/16/17 0241  WBC  9.6 12.3* 11.8* 11.2*  NEUTROABS 6.4 6.8  --  2.8  HGB 11.3* 9.8* 10.6* 9.7*  HCT 37.8 32.5* 35.8* 32.6*  MCV 78.8 78.5 81.0 79.5  PLT 310 286 270 254   Cardiac Enzymes: Recent Labs  Lab 07/15/17 1008  CKTOTAL 42   BNP: Invalid input(s): POCBNP CBG: Recent Labs  Lab 07/16/17 0747 07/16/17 1244 07/16/17 1736 07/16/17 2129 07/17/17 0759  GLUCAP 121* 153* 177* 210* 156*   D-Dimer No results for input(s): DDIMER in the last 72 hours. Hgb A1c Recent Labs    07/14/17 1103  HGBA1C 9.8*   Lipid Profile No results for input(s): CHOL, HDL, LDLCALC, TRIG, CHOLHDL, LDLDIRECT in the last 72 hours. Thyroid function studies Recent Labs    07/15/17 1008  T3FREE 2.1   Anemia work up No results for input(s): VITAMINB12, FOLATE, FERRITIN, TIBC, IRON, RETICCTPCT in the last 72 hours. Urinalysis    Component Value Date/Time   COLORURINE YELLOW 07/14/2017 0749   APPEARANCEUR HAZY (A) 07/14/2017 0749   LABSPEC 1.009 07/14/2017 0749   PHURINE 5.0 07/14/2017 0749   GLUCOSEU >=500 (A) 07/14/2017 0749   HGBUR NEGATIVE 07/14/2017 0749   BILIRUBINUR NEGATIVE 07/14/2017 0749   KETONESUR NEGATIVE 07/14/2017 0749   PROTEINUR NEGATIVE 07/14/2017 0749   UROBILINOGEN 0.2 05/04/2012 0728   NITRITE NEGATIVE 07/14/2017 0749   LEUKOCYTESUR NEGATIVE 07/14/2017 0749   Sepsis Labs Invalid input(s): PROCALCITONIN,  WBC,  LACTICIDVEN Microbiology Recent Results (from the past 240 hour(s))  MRSA PCR Screening     Status: None    Collection Time: 07/14/17 11:20 AM  Result Value Ref Range Status   MRSA by PCR NEGATIVE NEGATIVE Final    Comment:        The GeneXpert MRSA Assay (FDA approved for NASAL specimens only), is one component of a comprehensive MRSA colonization surveillance program. It is not intended to diagnose MRSA infection nor to guide or monitor treatment for MRSA infections. Performed at [facility], [address], [place], [place] [address]      Time coordinating discharge: 33 minutes  SIGNED:   Alwyn Ren, MD  [facility] 07/17/2017, 10:30 AM Pager   If 7PM-7AM, please contact night-coverage [email or web address] Password [email or web address]

## 2017-07-17 NOTE — Evaluation (Signed)
Physical Therapy Evaluation & Discharge Patient Details Name: Donna Booker MRN: [id number] DOB: 15-Apr-1945 Today's Date: 07/17/2017   History of Present Illness  Pt is a 72 y.o. female admitted 07/13/17 with c/o weakness, dizziness, and fall. Worked up for hyperkalemia; of note, admitted with same dx 05/2017. Also with AKI on CKD III and asymptomatic bradycardia. PMH includes DM, a-fib with RVR, RA.    Clinical Impression  Patient evaluated by Physical Therapy with no further acute PT needs identified. PTA, pt indep, works, and lives with family available for 24/7 support. Endorses no falls besides one leading to admission. Today, pt indep with ambulation and ADLs; VSS and asymptomatic throughout session. Educ on fall risk reduction and energy conservation. All education has been completed and the patient has no further questions. PT is signing off. Thank you for this referral.    Follow Up Recommendations No PT follow up;Supervision - Intermittent    Equipment Recommendations  None recommended by PT    Recommendations for Other Services       Precautions / Restrictions Precautions Precautions: None Restrictions Weight Bearing Restrictions: No      Mobility  Bed Mobility Overal bed mobility: Independent                Transfers Overall transfer level: Independent Equipment used: None                Ambulation/Gait Ambulation/Gait assistance: Modified independent (Device/Increase time) Gait Distance (Feet): 200 Feet Assistive device: None Gait Pattern/deviations: Step-through pattern;Decreased stride length Gait velocity: Decreased Gait velocity interpretation: 1.31 - 2.62 ft/sec, indicative of limited community ambulator General Gait Details: Slow, steady amb with no DME. Mod indep due to increased time. 2x standing rest break, pt conversing throughout walk  Stairs            Wheelchair Mobility    Modified Rankin (Stroke Patients Only)        Balance Overall balance assessment: Mild deficits observed, not formally tested                                           Pertinent Vitals/Pain Pain Assessment: No/denies pain    Home Living Family/patient expects to be discharged to:: Private residence Living Arrangements: Spouse/significant other;Other relatives Available Help at Discharge: Family;Available 24 hours/day Type of Home: House Home Access: Level entry     Home Layout: One level(2 steps into den) Home Equipment: Walker - 4 wheels;Shower seat      Prior Function Level of Independence: Independent         Comments: Drives, works as Museum/gallery conservator (primarily desk job)     Higher education careers adviser        Extremity/Trunk Assessment   Upper Extremity Assessment Upper Extremity Assessment: Overall WFL for tasks assessed    Lower Extremity Assessment Lower Extremity Assessment: Overall WFL for tasks assessed    Cervical / Trunk Assessment Cervical / Trunk Assessment: Normal  Communication   Communication: No difficulties  Cognition Arousal/Alertness: Awake/alert Behavior During Therapy: WFL for tasks assessed/performed Overall Cognitive Status: Within Functional Limits for tasks assessed                                 General Comments: Grieving recent loss of son and became tearful; reports she is seeking counseling for this (already  has resources through PCP and church)      General Comments      Exercises     Assessment/Plan    PT Assessment Patent does not need any further PT services  PT Problem List         PT Treatment Interventions      PT Goals (Current goals can be found in the Care Plan section)  Acute Rehab PT Goals Patient Stated Goal: Return home today PT Goal Formulation: All assessment and education complete, DC therapy    Frequency     Barriers to discharge        Co-evaluation               AM-PAC PT "6 Clicks" Daily  Activity  Outcome Measure Difficulty turning over in bed (including adjusting bedclothes, sheets and blankets)?: None Difficulty moving from lying on back to sitting on the side of the bed? : None Difficulty sitting down on and standing up from a chair with arms (e.g., wheelchair, bedside commode, etc,.)?: None Help needed moving to and from a bed to chair (including a wheelchair)?: None Help needed walking in hospital room?: None Help needed climbing 3-5 steps with a railing? : A Little 6 Click Score: 23    End of Session Equipment Utilized During Treatment: Gait belt Activity Tolerance: Patient tolerated treatment well Patient left: in chair;with call bell/phone within reach Nurse Communication: Mobility status PT Visit Diagnosis: Other abnormalities of gait and mobility (R26.89)    Time: 1025-1040 PT Time Calculation (min) (ACUTE ONLY): 15 min   Charges:   PT Evaluation $PT Eval Moderate Complexity: 1 Mod     PT G Codes:       Ina Homes, PT, DPT Acute Rehab Services  Pager: [phone]  Malachy Chamber 07/17/2017, 10:52 AM

## 2017-07-17 NOTE — Progress Notes (Signed)
Removed PIV access. Pt's BP was high and informed Dr. Jerolyn Center regarding this matter. Dr. Jerolyn Center increased dose of propranolol from 10mg  to 20mg  and pt was okay to discharge to home. Patient's pharmacy was wrong and changed to [facility] to [facility]. Pt received the discharge instructions and understood well. Patient worry about the changing diabetic medications, and will follow up with endocrinologist. Pt took her all belongings. HS 

## 2017-07-18 ENCOUNTER — Telehealth: Payer: Self-pay | Admitting: Internal Medicine

## 2017-07-18 ENCOUNTER — Telehealth: Payer: Self-pay | Admitting: *Deleted

## 2017-07-18 NOTE — Telephone Encounter (Signed)
Appt made

## 2017-07-18 NOTE — Telephone Encounter (Signed)
Patient was just released from hospital because of her diabetes and high potassium. She would like to be seen asap but the first available I see is end of August. Patient would like a call at ph# [phone] to discuss/advise

## 2017-07-18 NOTE — Telephone Encounter (Signed)
3pm tomorrow

## 2017-07-18 NOTE — Telephone Encounter (Signed)
Transition Care Management Follow-up Telephone Call   Date discharged? 07/17/17   How have you been since you were released from the hospital? Pt states she is doing alright   Do you understand why you were in the hospital? YES   Do you understand the discharge instructions? YES   Where were you discharged to? Home   Items Reviewed:  Medications reviewed: YES  Allergies reviewed: YES  Dietary changes reviewed: YES, heart healthy  Referrals reviewed: YES   Functional Questionnaire:   Activities of Daily Living (ADLs):   She states she are independent in the following: bathing and hygiene, feeding, continence, grooming, toileting and dressing States they require assistance with the following: ambulation   Any transportation issues/concerns?: NO   Any patient concerns? NO   Confirmed importance and date/time of follow-up visits scheduled YES, appt 07/20/17  Provider Appointment booked with Dr. Okey Dupre  Confirmed with patient if condition begins to worsen call PCP or go to the ER.  Patient was given the office number and encouraged to call back with question or concerns.  : YES

## 2017-07-18 NOTE — Telephone Encounter (Signed)
Please advise on below  

## 2017-07-19 ENCOUNTER — Ambulatory Visit: Payer: BLUE CROSS/BLUE SHIELD | Admitting: Internal Medicine

## 2017-07-19 ENCOUNTER — Encounter: Payer: Self-pay | Admitting: Internal Medicine

## 2017-07-19 ENCOUNTER — Other Ambulatory Visit: Payer: Self-pay | Admitting: Internal Medicine

## 2017-07-19 VITALS — BP 130/80 | HR 65 | Ht 64.0 in | Wt 198.8 lb

## 2017-07-19 DIAGNOSIS — E875 Hyperkalemia: Secondary | ICD-10-CM | POA: Diagnosis not present

## 2017-07-19 DIAGNOSIS — E1165 Type 2 diabetes mellitus with hyperglycemia: Secondary | ICD-10-CM

## 2017-07-19 DIAGNOSIS — E1142 Type 2 diabetes mellitus with diabetic polyneuropathy: Secondary | ICD-10-CM

## 2017-07-19 DIAGNOSIS — E0590 Thyrotoxicosis, unspecified without thyrotoxic crisis or storm: Secondary | ICD-10-CM | POA: Diagnosis not present

## 2017-07-19 DIAGNOSIS — Z794 Long term (current) use of insulin: Secondary | ICD-10-CM | POA: Diagnosis not present

## 2017-07-19 DIAGNOSIS — IMO0002 Reserved for concepts with insufficient information to code with codable children: Secondary | ICD-10-CM

## 2017-07-19 LAB — ALDOSTERONE + RENIN ACTIVITY W/ RATIO
ALDO / PRA RATIO: 1.8 (ref 0.0–30.0)
Aldosterone: 6.2 ng/dL (ref 0.0–30.0)
PRA LC/MS/MS: 3.427 ng/mL/h (ref 0.167–5.380)

## 2017-07-19 MED ORDER — GLIPIZIDE 5 MG PO TABS: 10.0000 mg | ORAL_TABLET | Freq: Two times a day (BID) | ORAL | 11 refills | Status: DC

## 2017-07-19 MED ORDER — INSULIN DETEMIR 100 UNIT/ML FLEXPEN: PEN_INJECTOR | SUBCUTANEOUS | 3 refills | Status: DC

## 2017-07-19 MED ORDER — METHIMAZOLE 5 MG PO TABS: 5.0000 mg | ORAL_TABLET | Freq: Two times a day (BID) | ORAL | 5 refills | Status: DC

## 2017-07-19 NOTE — Patient Instructions (Addendum)
Please decrease: - Methimazole to 5 mg 2x a day with meals.  Continue: - Propranolol 20 mg 3x a day  Increase: - Levemir to 35 units at night  - Glipizide to 10 mg 2x a day before meals  Start checking sugars 1x a day.  You may need to see a kidney doctor for the potassium problem.  Please return in September.

## 2017-07-19 NOTE — Progress Notes (Signed)
Patient ID: Donna Booker, female   DOB: 18-May-1945, 72 y.o.   MRN: [id number]  HPI: Donna Booker is a 72 y.o.-year-old female, returning for f/u for DM2, dx in 1987, insulin-dependent since 2006, uncontrolled, with complications (PN, stage 2 CKD)and new dx of Graves ds. Last visit 2 months ago.  She was restarted on Metformin, Baclofen and Prednisone (for joint pain) by PCP after her visit on 07/06/2017.Since last visit she was admitted to the hospital for hyperkalemia and acute renal failure along with weakness, leg cramps, and dizziness.    TSH was found to be suppressed, but free T4 was low and free T3 was normal.  Potassium was found to be high, in the setting of potassium supplement, lisinopril and Lasix use, but improved on Kayexalate and after her ACE inhibitor and loop diuretic were stopped.  Her son died in June 28, 2022 -he had severe heart failure.  Graves ds  - diagnosed during admission for A. fib with RVR in 04/2017 Lab Results  Component Value Date   TSH <0.010 (L) 07/14/2017   TSH <0.01 (L) 07/06/2017   TSH <0.01 Repeated and verified X2. (L) 05/10/2017   TSH <0.010 (L) 04/16/2017   Lab Results  Component Value Date   FREET4 0.71 (L) 07/15/2017   FREET4 0.72 07/06/2017   FREET4 1.15 05/10/2017   FREET4 5.15 (H) 04/16/2017   Lab Results  Component Value Date   T3FREE 2.1 07/15/2017   T3FREE 4.2 05/10/2017   T3FREE 18.5 (H) 04/16/2017   Lab Results  Component Value Date   TSI 8.02 (H) 04/16/2017   She was started on methimazole 10 mg 2x a day and atenolol 50 mg 3x a day.  At last visit, as her TFTs were significantly improved, we decrease the methimazole to 5 mg 2X a day.  Since last visit atenolol was switched to propranolol 20 mg 3X a day.  Pt denies: - feeling nodules in neck - hoarseness - dysphagia - choking - SOB with lying down  No FH of thyroid disease. No FH of thyroid cancer. No h/o radiation tx to head or neck.  DM2:  Last hemoglobin A1c  was: Lab Results  Component Value Date   HGBA1C 9.8 (H) 07/14/2017   HGBA1C 8.0 02/01/2016   HGBA1C 8.3 (H) 10/20/2015   Pt wason a regimen of: - Metformin 1000 mg in am - VGo 30 - initially with 1-2 meal clicks, now off these - started 2015 She was on Lantus 30, but taken off to start the VGo. She was on Victoza. She tried Bydureon (02/2013-10/2014) >> made her hungry, did not lower sugars. She also tried Trulicity 1.5 mg (10/2013-04/2014) >> did not lower her sugars.  She is now on: - Levemir 25 units after dinner - Glipizide 5 mg 2x a day before b'fast and dinner.   Patient does not check sugars. - am: 99-138, 161 >> 94-128 >> ? - 2h after b'fast: n/c - before lunch: 221 (cantaloupe) >> n/c - 2h after lunch: n/c >> 200 >> n/c - before dinner: 119, 130-151 >> 120-125 >> ? - 2h after dinner: n/c >> 189 >> n/c - bedtime: 192, 265 >> 130-137 >> ? - nighttime: n/c Lowest sugar was 94 >> ?; she has hypoglycemia awareness in the 70s. Highest sugar was 298 >> ?  Glucometer: AccuChek  -+ CKD, last BUN/creatinine:  Lab Results  Component Value Date   BUN 19 07/16/2017   CREATININE 1.03 (H) 07/16/2017  On olmesartan. -+ HL;  last set of lipids: Lab Results  Component Value Date   CHOL 248 (H) 10/20/2015   HDL 38.70 (L) 10/20/2015   LDLCALC 55 09/01/2014   LDLDIRECT 161.0 10/20/2015   TRIG 222.0 (H) 10/20/2015   CHOLHDL 6 10/20/2015  On pravastatin. - last eye exam was in 04/2017: No DR - no numbness and tingling in her feet.   Hyperkalemia: - Possibly previously from ACE inhibitor, potassium supplementation +/-beta-blocker -Latest potassium reviewed: Normal: Lab Results  Component Value Date   K 4.3 07/16/2017   K 4.3 07/15/2017   K 4.4 07/14/2017   K 7.0 (HH) 07/14/2017   K 7.5 (HH) 07/14/2017   Of note, a cosyntropin stimulation test was normal and also aldosterone and renin were normal while in the hospital.    She has CKD with AKI on  admission.  ROS: Constitutional: no weight gain/no weight loss, no fatigue, no subjective hyperthermia, no subjective hypothermia Eyes: no blurry vision, no xerophthalmia ENT: no sore throat, + see HPI Cardiovascular: no CP/no SOB/no palpitations/no leg swelling Respiratory: no cough/no SOB/no wheezing Gastrointestinal: no N/no V/no D/no C/no acid reflux Musculoskeletal: no muscle aches/+ joint aches Skin: no rashes, no hair loss Neurological: no tremors/no numbness/no tingling/no dizziness  I reviewed pt's medications, allergies, PMH, social hx, family hx, and changes were documented in the history of present illness. Otherwise, unchanged from my initial visit note.  Past Medical History:  Diagnosis Date  . Diabetes mellitus   . Graves disease   . Hypertension   . Hyperthyroidism    Past Surgical History:  Procedure Laterality Date  . BREAST SURGERY     Social History   Social History  . Marital Status: Married    Spouse Name: N/A  . Number of Children: 1   Occupational History  . 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 Smoking status: Never Smoker   . Smokeless tobacco: Not on file  . Alcohol Use: No  . Drug Use: No   Current Outpatient Medications on File Prior to Visit  Medication Sig Dispense Refill  . amLODipine (NORVASC) 10 MG tablet Take 1 tablet (10 mg total) by mouth daily. 30 tablet 0  . apixaban (ELIQUIS) 5 MG TABS tablet Take 1 tablet (5 mg total) by mouth 2 (two) times daily. 60 tablet 0  . aspirin EC 81 MG tablet Take 81 mg by mouth daily.    . baclofen (LIORESAL) 10 MG tablet Take 1 tablet (10 mg total) by mouth 3 (three) times daily. (Patient taking differently: Take 5-10 mg by mouth See admin instructions. Take 5 mg during the day and 10 mg at bedtime) 60 each 0  . diclofenac sodium (VOLTAREN) 1 % GEL Apply 2 g topically 4 (four) times daily as needed (pain).    Marland Kitchen esomeprazole (NEXIUM) 40 MG capsule Take 40 mg by mouth daily before  breakfast.    . fluticasone furoate-vilanterol (BREO ELLIPTA) 200-25 MCG/INH AEPB Inhale 1 puff into the lungs daily. 60 each 3  . gabapentin (NEURONTIN) 300 MG capsule Take 1 capsule (300 mg total) by mouth 3 (three) times daily. 270 capsule 1  . glipiZIDE (GLUCOTROL) 5 MG tablet Take 1 tablet (5 mg total) by mouth 2 (two) times daily before a meal. NEED ANNUAL VISIT FOR REFILLS 60 tablet 0  . insulin detemir (LEVEMIR) 100 UNIT/ML injection Inject 0.25 mLs (25 Units total) into the skin daily at 10 pm. 10 mL 1  . methimazole (TAPAZOLE) 5 MG tablet  Take 2 tablets (10 mg total) by mouth 2 (two) times daily. 120 tablet 5  . pravastatin (PRAVACHOL) 40 MG tablet Take 1 tablet (40 mg total) by mouth daily at 6 PM. 30 tablet 1  . propranolol (INDERAL) 10 MG tablet Take 2 tablets (20 mg total) by mouth 3 (three) times daily. 90 tablet 1   No current facility-administered medications on file prior to visit.    Allergies  Allergen Reactions  . Tramadol Nausea And Vomiting    FH - see HPI  PE: There were no vitals taken for this visit. Wt Readings from Last 3 Encounters:  07/17/17 197 lb 15.6 oz (89.8 kg)  07/06/17 198 lb (89.8 kg)  05/15/17 189 lb (85.7 kg)   Constitutional: overweight, in NAD Eyes: PERRLA, EOMI, no exophthalmos ENT: moist mucous membranes, no thyromegaly, no cervical lymphadenopathy Cardiovascular: RRR, No MRG Respiratory: CTA B Gastrointestinal: abdomen soft, NT, ND, BS+ Musculoskeletal: no deformities, strength intact in all 4 Skin: moist, warm, no rashes Neurological: no tremor with outstretched hands, DTR normal in all 4  ASSESSMENT: 1. DM2, insulin-dependent, uncontrolled, with complications - PN - CKD stage 2  2.  Hyperthyroidism  3.  Hyperkalemia  PLAN:  1. Patient with long-standing, uncontrolled, type 2 diabetes, on basal insulin and glipizide only.  Metformin and Victoza were stopped before last visit.  Unfortunately, she was not checking sugars at last  visit so it was impossible to adjust her regimen.  Latest HbA1c was reviewed and this was very high few days ago, at 9.8%, increased from 7.9% at last visit - At this visit, she is still not checking sugars, so it is difficult to make decisions for treatment.  I advised her to increase Levemir and glipizide but I strongly advised her to start checking her sugars, - I suggested to:   Patient Instructions  Please decrease: - Methimazole to 5 mg 2x a day with meals.  Continue: - Propranolol 20 mg 3x a day  Increase: - Levemir to 35 units at night  - Glipizide to 10 mg 2x a day before meals  Start checking sugars 1x a day.  You may need to see a kidney doctor for the potassium problem.  Please return in September.   - continue checking sugars at different times of the day - check 1-2x a day, rotating checks - advised for yearly eye exams >> she is UTD - Return to clinic in 1.5 mo with sugar log   2.  Hyperthyroidism - Graves' disease due to elevated TSI antibodies in 04/2017 - Diagnosed fairly recently after she was admitted with A. fib with RVR than acute pulmonary edema in 04/2017 - She confirmed symptoms of weight loss, diarrhea, but denied tremors, palpitations, anxiety, heat intolerance, at last visit. - At that time, we discussed about possible modalities of treatment for Graves' disease to include methimazole, radioactive iodine or, last resort, surgery - when I saw her last, she was already started on methimazole 10 mg twice a day and atenolol 50 mg twice a day (currently on propranolol 20 mg 3x a day), but as her TFTs were already improved, we decreased the methimazole dose to 5 mg twice a day - At her recent hospitalization, her TSH was suppressed, but her free T4 was also low.  At that time, her methimazole was increased to 10 mg twice a day, however, reviewing her chart, her TSH was suppressed, with free T4 was low and free T3 was normal.  In this case,  I would suggest to  decrease the dose of methimazole back to 5 mg twice a day and repeat her TFTs in 1.5 months.  She has an appointment scheduled with me already for then.  3.  Hyperkalemia - At last visit, labs returned abnormal, with a critical value of potassium of 7.5.  She was admitted with high potassium since then >> Lasix, potassium supplementation, and lisinopril were stopped. - A cosyntropin stimulation test performed in the hospital was normal, ruling out primary adrenal insufficiency as a cause for hyperkalemia - Aldosterone and renin levels were normal during her last hospitalization - On her medication list, I do not see any possible culprits,   Other than possibly propranolol - I suggested a referral to nephrology  if her potassium continues to remain elevated after stopping ACE inhibitor and potassium supplementation  Carlus Pavlov, MD PhD [facility] Endocrinology

## 2017-07-20 ENCOUNTER — Other Ambulatory Visit (INDEPENDENT_AMBULATORY_CARE_PROVIDER_SITE_OTHER): Payer: BLUE CROSS/BLUE SHIELD

## 2017-07-20 ENCOUNTER — Encounter: Payer: Self-pay | Admitting: Internal Medicine

## 2017-07-20 ENCOUNTER — Ambulatory Visit: Payer: BLUE CROSS/BLUE SHIELD | Admitting: Internal Medicine

## 2017-07-20 VITALS — BP 134/70 | HR 67 | Temp 97.8°F | Ht 64.0 in | Wt 199.0 lb

## 2017-07-20 DIAGNOSIS — N189 Chronic kidney disease, unspecified: Secondary | ICD-10-CM

## 2017-07-20 DIAGNOSIS — N179 Acute kidney failure, unspecified: Secondary | ICD-10-CM

## 2017-07-20 DIAGNOSIS — E875 Hyperkalemia: Secondary | ICD-10-CM | POA: Diagnosis not present

## 2017-07-20 LAB — COMPREHENSIVE METABOLIC PANEL
ALBUMIN: 4.3 g/dL (ref 3.5–5.2)
ALT: 12 U/L (ref 0–35)
AST: 12 U/L (ref 0–37)
Alkaline Phosphatase: 111 U/L (ref 39–117)
BILIRUBIN TOTAL: 0.3 mg/dL (ref 0.2–1.2)
BUN: 33 mg/dL — ABNORMAL HIGH (ref 6–23)
CALCIUM: 9.5 mg/dL (ref 8.4–10.5)
CO2: 27 mEq/L (ref 19–32)
CREATININE: 1.07 mg/dL (ref 0.40–1.20)
Chloride: 103 mEq/L (ref 96–112)
GFR: 65.19 mL/min (ref >60.00)
Glucose, Bld: 163 mg/dL — ABNORMAL HIGH (ref 70–99)
Potassium: 4.1 mEq/L (ref 3.5–5.1)
Sodium: 139 mEq/L (ref 135–145)
Total Protein: 7.9 g/dL (ref 6.0–8.3)

## 2017-07-20 LAB — CBC
HCT: 38.4 % (ref 36.0–46.0)
HEMOGLOBIN: 12.2 g/dL (ref 12.0–15.0)
MCHC: 31.6 g/dL (ref 30.0–36.0)
MCV: 76.6 fl — AB (ref 78.0–100.0)
PLATELETS: 346 10*3/uL (ref 150.0–400.0)
RBC: 5.02 Mil/uL (ref 3.87–5.11)
RDW: 18.5 % — ABNORMAL HIGH (ref 11.5–15.5)
WBC: 13.4 10*3/uL — ABNORMAL HIGH (ref 4.0–10.5)

## 2017-07-20 MED ORDER — DOXYCYCLINE HYCLATE 100 MG PO TABS: 100.0000 mg | ORAL_TABLET | Freq: Two times a day (BID) | ORAL | 0 refills | Status: DC

## 2017-07-20 NOTE — Assessment & Plan Note (Signed)
Her CKD is relatively mild and the AKI resolved in the hospital. Checking CMP today. She is eating and drinking well. BP at goal, does have diabetes.

## 2017-07-20 NOTE — Progress Notes (Signed)
   Subjective:    Patient ID: Donna Booker, female    DOB: [birth year], 72 y.o.   MRN: [id number]  HPI The patient is a 72 YO female coming in for hospital follow up (in for unprovoked hyperkalemia, second episode in the last month, she was not taking any medications known to produce hyperkalemia, first episode she was taking low dose ace-I, potassium and lasix, both episodes were severe and required hospitalization, testing done to rule out malignancy, US renal done without changes, mild CKD chronically but fairly normal kidney function). She is feeling well since leaving the hospital. The only thing she can think of is possibly eating more fruit prior to the high potassium levels. She denies taking any medicines that she was told to stop. She denies chest pains, SOB, abdominal pain, diarrhea. Mild constipation but 2 BM today and feels things are going back to normal.   PMH, [facility], social history reviewed and updated.   Review of Systems  Constitutional: Negative.   HENT: Negative.   Eyes: Negative.   Respiratory: Negative for cough, chest tightness and shortness of breath.   Cardiovascular: Negative for chest pain, palpitations and leg swelling.  Gastrointestinal: Negative for abdominal distention, abdominal pain, constipation, diarrhea, nausea and vomiting.  Musculoskeletal: Negative.   Skin: Negative.   Neurological: Negative.   Psychiatric/Behavioral: Negative.       Objective:   Physical Exam  Constitutional: She is oriented to person, place, and time. She appears well-developed and well-nourished.  HENT:  Head: Normocephalic and atraumatic.  Eyes: EOM are normal.  Neck: Normal range of motion.  Cardiovascular: Normal rate and regular rhythm.  Pulmonary/Chest: Effort normal and breath sounds normal. No respiratory distress. She has no wheezes. She has no rales.  Abdominal: Soft. Bowel sounds are normal. She exhibits no distension. There is no tenderness. There is no rebound.    Musculoskeletal: She exhibits no edema.  Neurological: She is alert and oriented to person, place, and time. Coordination normal.  Skin: Skin is warm and dry.  Psychiatric: She has a normal mood and affect.   Vitals:   07/20/17 1452  BP: 134/70  Pulse: 67  Temp: 97.8 F (36.6 C)  TempSrc: Oral  SpO2: 98%  Weight: 199 lb (90.3 kg)  Height: 5\' 4"  (1.626 m)      Assessment & Plan:

## 2017-07-20 NOTE — Patient Instructions (Signed)
We have sent in doxycycline to take 1 pill twice a day for the spot on your back.   We are checking the labs today and will call you back about the results.

## 2017-07-20 NOTE — Assessment & Plan Note (Signed)
Concerning that this has recurred off meds that are likely to raise potassium. She was eating more fruits but with her relatively normal kidney function (cr 1.3 at onset of 7.5 potassium) I would not expect hyperkalemia or to that degree. Prelim testing in the hospital with normal aldosterone renin ratio and US kidneys normal. Referral to nephrology for consideration of more testing.

## 2017-07-29 ENCOUNTER — Other Ambulatory Visit: Payer: Self-pay | Admitting: Internal Medicine

## 2017-08-13 LAB — BASIC METABOLIC PANEL
BUN: 35 — AB (ref 4–21)
CREATININE: 1.2 — AB (ref 0.5–1.1)
GLUCOSE: 328
Potassium: 4.4 (ref 3.4–5.3)
Sodium: 136 — AB (ref 137–147)

## 2017-08-17 ENCOUNTER — Encounter: Payer: Self-pay | Admitting: Internal Medicine

## 2017-09-06 ENCOUNTER — Ambulatory Visit: Payer: BLUE CROSS/BLUE SHIELD | Admitting: Internal Medicine

## 2017-09-06 DIAGNOSIS — Z0289 Encounter for other administrative examinations: Secondary | ICD-10-CM

## 2017-09-30 ENCOUNTER — Other Ambulatory Visit: Payer: Self-pay | Admitting: Internal Medicine

## 2017-10-13 ENCOUNTER — Other Ambulatory Visit: Payer: Self-pay | Admitting: Internal Medicine

## 2017-10-28 ENCOUNTER — Other Ambulatory Visit: Payer: Self-pay | Admitting: Internal Medicine

## 2017-11-01 ENCOUNTER — Emergency Department (HOSPITAL_COMMUNITY)
Admission: EM | Admit: 2017-11-01 | Discharge: 2017-11-01 | Disposition: A | Discharge status: Home / Self Care | Payer: BLUE CROSS/BLUE SHIELD | Attending: Emergency Medicine | Admitting: Emergency Medicine

## 2017-11-01 ENCOUNTER — Emergency Department (HOSPITAL_COMMUNITY): Payer: BLUE CROSS/BLUE SHIELD

## 2017-11-01 ENCOUNTER — Encounter (HOSPITAL_COMMUNITY): Payer: Self-pay

## 2017-11-01 ENCOUNTER — Other Ambulatory Visit: Payer: Self-pay

## 2017-11-01 DIAGNOSIS — R51 Headache: Secondary | ICD-10-CM | POA: Insufficient documentation

## 2017-11-01 DIAGNOSIS — Z794 Long term (current) use of insulin: Secondary | ICD-10-CM | POA: Insufficient documentation

## 2017-11-01 DIAGNOSIS — I10 Essential (primary) hypertension: Secondary | ICD-10-CM | POA: Diagnosis not present

## 2017-11-01 DIAGNOSIS — E119 Type 2 diabetes mellitus without complications: Secondary | ICD-10-CM | POA: Insufficient documentation

## 2017-11-01 DIAGNOSIS — Z7901 Long term (current) use of anticoagulants: Secondary | ICD-10-CM | POA: Diagnosis not present

## 2017-11-01 DIAGNOSIS — Z79899 Other long term (current) drug therapy: Secondary | ICD-10-CM | POA: Insufficient documentation

## 2017-11-01 DIAGNOSIS — R519 Headache, unspecified: Secondary | ICD-10-CM

## 2017-11-01 LAB — COMPREHENSIVE METABOLIC PANEL
ALT: 16 U/L (ref 0–44)
AST: 20 U/L (ref 15–41)
Albumin: 3.4 g/dL — ABNORMAL LOW (ref 3.5–5.0)
Alkaline Phosphatase: 147 U/L — ABNORMAL HIGH (ref 38–126)
Anion gap: 7 (ref 5–15)
BUN: 22 mg/dL (ref 8–23)
CO2: 26 mmol/L (ref 22–32)
Calcium: 9 mg/dL (ref 8.9–10.3)
Chloride: 102 mmol/L (ref 98–111)
Creatinine, Ser: 1.22 mg/dL — ABNORMAL HIGH (ref 0.44–1.00)
GFR calc Af Amer: 51 mL/min — ABNORMAL LOW (ref >60)
GFR calc non Af Amer: 44 mL/min — ABNORMAL LOW (ref >60)
Glucose, Bld: 325 mg/dL — ABNORMAL HIGH (ref 70–99)
Potassium: 4.3 mmol/L (ref 3.5–5.1)
Sodium: 135 mmol/L (ref 135–145)
Total Bilirubin: 0.4 mg/dL (ref 0.3–1.2)
Total Protein: 6.8 g/dL (ref 6.5–8.1)

## 2017-11-01 LAB — CBC
HCT: 41.3 % (ref 36.0–46.0)
Hemoglobin: 12.3 g/dL (ref 12.0–15.0)
MCH: 23.7 pg — ABNORMAL LOW (ref 26.0–34.0)
MCHC: 29.8 g/dL — ABNORMAL LOW (ref 30.0–36.0)
MCV: 79.7 fL — ABNORMAL LOW (ref 80.0–100.0)
Platelets: 326 10*3/uL (ref 150–400)
RBC: 5.18 MIL/uL — ABNORMAL HIGH (ref 3.87–5.11)
RDW: 13.1 % (ref 11.5–15.5)
WBC: 11 10*3/uL — ABNORMAL HIGH (ref 4.0–10.5)
nRBC: 0 % (ref 0.0–0.2)

## 2017-11-01 LAB — DIFFERENTIAL
Abs Immature Granulocytes: 0.03 10*3/uL (ref 0.00–0.07)
Basophils Absolute: 0 10*3/uL (ref 0.0–0.1)
Basophils Relative: 0 %
Eosinophils Absolute: 0.2 10*3/uL (ref 0.0–0.5)
Eosinophils Relative: 1 %
Immature Granulocytes: 0 %
Lymphocytes Relative: 45 %
Lymphs Abs: 5 10*3/uL — ABNORMAL HIGH (ref 0.7–4.0)
Monocytes Absolute: 0.8 10*3/uL (ref 0.1–1.0)
Monocytes Relative: 7 %
Neutro Abs: 5 10*3/uL (ref 1.7–7.7)
Neutrophils Relative %: 47 %

## 2017-11-01 LAB — I-STAT CHEM 8, ED
BUN: 32 mg/dL — ABNORMAL HIGH (ref 8–23)
Calcium, Ion: 1.15 mmol/L (ref 1.15–1.40)
Chloride: 101 mmol/L (ref 98–111)
Creatinine, Ser: 1.2 mg/dL — ABNORMAL HIGH (ref 0.44–1.00)
Glucose, Bld: 335 mg/dL — ABNORMAL HIGH (ref 70–99)
HCT: 41 % (ref 36.0–46.0)
Hemoglobin: 13.9 g/dL (ref 12.0–15.0)
Potassium: 4.5 mmol/L (ref 3.5–5.1)
Sodium: 136 mmol/L (ref 135–145)
TCO2: 30 mmol/L (ref 22–32)

## 2017-11-01 LAB — CBG MONITORING, ED: Glucose-Capillary: 273 mg/dL — ABNORMAL HIGH (ref 70–99)

## 2017-11-01 LAB — APTT: aPTT: 42 seconds — ABNORMAL HIGH (ref 24–36)

## 2017-11-01 LAB — I-STAT TROPONIN, ED: Troponin i, poc: 0 ng/mL (ref 0.00–0.08)

## 2017-11-01 LAB — PROTIME-INR
INR: 1.14
Prothrombin Time: 14.5 seconds (ref 11.4–15.2)

## 2017-11-01 MED ORDER — DIPHENHYDRAMINE HCL 50 MG/ML IJ SOLN
12.5000 mg | Freq: Once | INTRAMUSCULAR | Status: AC
  Administered 2017-11-01: 12.5 mg via INTRAVENOUS
  Filled 2017-11-01: qty 1

## 2017-11-01 MED ORDER — SODIUM CHLORIDE 0.9 % IV BOLUS
500.0000 mL | Freq: Once | INTRAVENOUS | Status: AC
  Administered 2017-11-01: 500 mL via INTRAVENOUS

## 2017-11-01 MED ORDER — METOCLOPRAMIDE HCL 5 MG/ML IJ SOLN
10.0000 mg | Freq: Once | INTRAMUSCULAR | Status: AC
  Administered 2017-11-01: 10 mg via INTRAVENOUS
  Filled 2017-11-01: qty 2

## 2017-11-01 MED ORDER — BUTALBITAL-APAP-CAFFEINE 50-325-40 MG PO TABS: 1.0000 | ORAL_TABLET | Freq: Four times a day (QID) | ORAL | 0 refills | Status: DC | PRN

## 2017-11-01 MED ORDER — KETOROLAC TROMETHAMINE 15 MG/ML IJ SOLN
15.0000 mg | Freq: Once | INTRAMUSCULAR | Status: AC
  Administered 2017-11-01: 15 mg via INTRAVENOUS
  Filled 2017-11-01: qty 1

## 2017-11-01 NOTE — ED Provider Notes (Signed)
[facility] EMERGENCY DEPARTMENT Provider Note   CSN: [id number] Arrival date & time: 11/01/17  1239     History   Chief Complaint Chief Complaint  Patient presents with  . Headache  . Weakness    HPI Donna Booker is a 72 y.o. female.  HPI   72 year old female with headache.  Onset at rest around 9 AM this morning.  Pain is in the left temporal region.  Constant.  Does not radiate.  Around 10:30 AM she had a brief episode of sharp pain and tingling in her left upper arm which lasted approximately 10 seconds and resolved.  No associated chest pain or shortness of breath.  Denies any neck pain.  No weakness.  No numbness.  No changes in her speech.  No changes in her vision.  No nausea.  Past Medical History:  Diagnosis Date  . Diabetes mellitus   . Graves disease   . Hypertension   . Hyperthyroidism     Patient Active Problem List   Diagnosis Date Noted  . Hyperglycemia due to type 2 diabetes mellitus (HCC) 07/14/2017  . Hyponatremia 07/14/2017  . Fall at home, initial encounter 07/14/2017  . Rheumatoid arthritis (HCC) 07/14/2017  . Grief reaction 07/14/2017  . Drug-induced hyperkalemia 07/14/2017  . Myalgia 07/06/2017  . Acute gout 05/15/2017  . Hyperkalemia 05/11/2017  . Acute kidney injury superimposed on CKD (HCC) 05/11/2017  . New onset of congestive heart failure (HCC) 04/21/2017  . Acute on chronic respiratory failure with hypoxia (HCC) 04/21/2017  . Leukocytosis 04/21/2017  . History of atrial fibrillation   . Cough 04/20/2017  . Hyperthyroidism 04/18/2017  . Atrial fibrillation with RVR (HCC) 04/15/2017  . Routine general medical examination at a health care facility 10/20/2015  . Uncontrolled type 2 diabetes mellitus with diabetic polyneuropathy, with long-term current use of insulin (HCC) 02/25/2015  . Right foot pain 01/28/2015  . Hyperlipidemia 09/02/2014  . Morbid obesity (HCC) 09/02/2014  . HTN (hypertension) 10/04/2012     Past Surgical History:  Procedure Laterality Date  . BREAST SURGERY       OB History   None      Home Medications    Prior to Admission medications   Medication Sig Start Date End Date Taking? Authorizing Provider  amLODipine (NORVASC) 10 MG tablet Take 1 tablet (10 mg total) by mouth daily. 07/18/17  Yes Alwyn Ren, MD  apixaban (ELIQUIS) 5 MG TABS tablet Take 1 tablet (5 mg total) by mouth 2 (two) times daily. 04/20/17  Yes Calvert Cantor, MD  baclofen (LIORESAL) 10 MG tablet TAKE 1 TABLET BY MOUTH THREE TIMES A DAY Patient taking differently: Take 10 mg by mouth 3 (three) times daily.  07/30/17  Yes Myrlene Broker, MD  chlorthalidone (HYGROTON) 25 MG tablet Take 12.5 mg by mouth daily. 10/15/17  Yes [provider]  diclofenac sodium (VOLTAREN) 1 % GEL Apply 2 g topically 4 (four) times daily as needed (pain). 04/24/17  Yes Alwyn Ren, MD  esomeprazole (NEXIUM) 40 MG capsule Take 40 mg by mouth daily before breakfast.   Yes [provider]  fluticasone furoate-vilanterol (BREO ELLIPTA) 200-25 MCG/INH AEPB Inhale 1 puff into the lungs daily. 07/09/17  Yes Myrlene Broker, MD  furosemide (LASIX) 20 MG tablet Take 20 mg by mouth daily. 09/26/17  Yes [provider]  gabapentin (NEURONTIN) 300 MG capsule Take 1 capsule (300 mg total) by mouth 3 (three) times daily. 06/13/17  Yes Crawford,  Austin Miles, MD  glipiZIDE (GLUCOTROL) 5 MG tablet Take 2 tablets (10 mg total) by mouth 2 (two) times daily before a meal. 07/19/17  Yes Carlus Pavlov, MD  Insulin Detemir (LEVEMIR FLEXTOUCH) 100 UNIT/ML Pen INJECT 35 UNITS INTO THE SKIN DAILY AT 10 PM. Patient taking differently: Inject 35 Units into the skin at bedtime.  07/19/17  Yes Carlus Pavlov, MD  methimazole (TAPAZOLE) 5 MG tablet TAKE 2 TABLETS BY MOUTH TWICE A DAY Patient taking differently: Take 10 mg by mouth 2 (two) times daily.  10/01/17  Yes Carlus Pavlov, MD  metoprolol  succinate (TOPROL-XL) 50 MG 24 hr tablet Take 50 mg by mouth daily. 09/26/17  Yes [provider]  pravastatin (PRAVACHOL) 40 MG tablet Take 1 tablet (40 mg total) by mouth daily at 6 PM. 04/24/17  Yes Alwyn Ren, MD  propranolol (INDERAL) 10 MG tablet Take 2 tablets (20 mg total) by mouth 3 (three) times daily. 07/17/17  Yes Alwyn Ren, MD  LEVEMIR FLEXTOUCH 100 UNIT/ML Pen INJECT 35 UNITS INTO THE SKIN DAILY AT 10 PM. Patient not taking: No sig reported 10/15/17   Carlus Pavlov, MD  LEVEMIR FLEXTOUCH 100 UNIT/ML Pen INJECT 35 UNITS INTO THE SKIN DAILY AT 10 PM. Patient not taking: No sig reported 10/29/17   Carlus Pavlov, MD  methimazole (TAPAZOLE) 5 MG tablet Take 1 tablet (5 mg total) by mouth 2 (two) times daily. Patient not taking: Reported on 11/01/2017 07/19/17   Carlus Pavlov, MD    Family History Family History  Problem Relation Age of Onset  . Diabetes Mellitus II Mother   . Diabetes Mellitus II Father   . Hypertension Father   . Hyperlipidemia Father     Social History Social History   Tobacco Use  . Smoking status: Never Smoker  . Smokeless tobacco: Never Used  Substance Use Topics  . Alcohol use: No  . Drug use: No     Allergies   Tramadol   Review of Systems Review of Systems  All systems reviewed and negative, other than as noted in HPI.  Physical Exam Updated Vital Signs BP (!) 169/85   Pulse 70   Temp 97.8 F (36.6 C) (Oral)   Resp 16   SpO2 97%   Physical Exam  Constitutional: She is oriented to person, place, and time. She appears well-developed and well-nourished. No distress.  HENT:  Head: Normocephalic and atraumatic.  Eyes: Pupils are equal, round, and reactive to light. Conjunctivae and EOM are normal. Right eye exhibits no discharge. Left eye exhibits no discharge.  Neck: Neck supple.  Cardiovascular: Normal rate, regular rhythm and normal heart sounds. Exam reveals no gallop and no friction rub.    No murmur heard. Pulmonary/Chest: Effort normal and breath sounds normal. No respiratory distress.  Abdominal: Soft. She exhibits no distension. There is no tenderness.  Musculoskeletal: She exhibits no edema or tenderness.  Neurological: She is alert and oriented to person, place, and time. She has normal strength. She is not disoriented. No cranial nerve deficit or sensory deficit. GCS eye subscore is 4. GCS verbal subscore is 5. GCS motor subscore is 6.  Skin: Skin is warm and dry.  Psychiatric: She has a normal mood and affect. Her behavior is normal. Thought content normal.  Nursing note and vitals reviewed.    ED Treatments / Results  Labs (all labs ordered are listed, but only abnormal results are displayed) Labs Reviewed  APTT - Abnormal; Notable for the following components:  Result Value   aPTT 42 (*)    All other components within normal limits  CBC - Abnormal; Notable for the following components:   WBC 11.0 (*)    RBC 5.18 (*)    MCV 79.7 (*)    MCH 23.7 (*)    MCHC 29.8 (*)    All other components within normal limits  DIFFERENTIAL - Abnormal; Notable for the following components:   Lymphs Abs 5.0 (*)    All other components within normal limits  COMPREHENSIVE METABOLIC PANEL - Abnormal; Notable for the following components:   Glucose, Bld 325 (*)    Creatinine, Ser 1.22 (*)    Albumin 3.4 (*)    Alkaline Phosphatase 147 (*)    GFR calc non Af Amer 44 (*)    GFR calc Af Amer 51 (*)    All other components within normal limits  CBG MONITORING, ED - Abnormal; Notable for the following components:   Glucose-Capillary 273 (*)    All other components within normal limits  I-STAT CHEM 8, ED - Abnormal; Notable for the following components:   BUN 32 (*)    Creatinine, Ser 1.20 (*)    Glucose, Bld 335 (*)    All other components within normal limits  PROTIME-INR  I-STAT TROPONIN, ED    EKG EKG Interpretation  Date/Time:  Thursday November 01 2017  12:43:30 EDT Ventricular Rate:  76 PR Interval:  168 QRS Duration: 80 QT Interval:  374 QTC Calculation: 420 R Axis:   -5 Text Interpretation:  Normal sinus rhythm Normal ECG Confirmed by Raeford Razor [phone]) on 11/01/2017 1:53:35 PM   Radiology Ct Head Wo Contrast  Result Date: 11/01/2017 CLINICAL DATA:  Headache. EXAM: CT HEAD WITHOUT CONTRAST TECHNIQUE: Contiguous axial images were obtained from the base of the skull through the vertex without intravenous contrast. COMPARISON:  CT scan of July 09, 2011. FINDINGS: Brain: Mild chronic ischemic white matter disease is noted. Mild diffuse cortical atrophy is noted. No mass effect or midline shift is noted. Ventricular size is within normal limits. There is no evidence of mass lesion, hemorrhage or acute infarction. Vascular: No hyperdense vessel or unexpected calcification. Skull: Normal. Negative for fracture or focal lesion. Sinuses/Orbits: No acute finding. Other: None. IMPRESSION: Mild chronic ischemic white matter disease. Mild diffuse cortical atrophy. No acute intracranial abnormality seen. Electronically Signed   By: Lupita Raider, M.D.   On: 11/01/2017 13:21    Procedures Procedures (including critical care time)  Medications Ordered in ED Medications  ketorolac (TORADOL) 15 MG/ML injection 15 mg (15 mg Intravenous Given 11/01/17 1547)  metoCLOPramide (REGLAN) injection 10 mg (10 mg Intravenous Given 11/01/17 1550)  sodium chloride 0.9 % bolus 500 mL (500 mLs Intravenous New Bag/Given 11/01/17 1543)  diphenhydrAMINE (BENADRYL) injection 12.5 mg (12.5 mg Intravenous Given 11/01/17 1544)     Initial Impression / Assessment and Plan / ED Course  I have reviewed the triage vital signs and the nursing notes.  Pertinent labs & imaging results that were available during my care of the patient were reviewed by me and considered in my medical decision making (see chart for details).     72 year old female with headache.  Is  significant headache history.  She is on Eliquis.  CT the head without acute abnormality.  This was done within 6 hours of symptom onset.  Her neuro exam is nonfocal.  Headache was treated symptomatically with improvement. I'm not sure of what to make of the sharp  pain in her L arm and I'm not sure if it is related to her HA. Pretty brief. Lasted about 10 seconds. Atypical for ACS. Doesn't seem likely cervical radiculopathy.   Final Clinical Impressions(s) / ED Diagnoses   Final diagnoses:  Nonintractable headache, unspecified chronicity pattern, unspecified headache type    ED Discharge Orders    None       Raeford Razor, MD 11/01/17 [phone]

## 2017-11-01 NOTE — ED Triage Notes (Signed)
Pt presents for evaluation of possible stroke like symptoms. Pt reports around 1030 am pt started having left arm tingling and pain that resolved in around 15-20 seconds. After that pt started having right arm weakness and numbness x 30 min. Called family at 11 am. Symptoms have resolved except headache. Pt is on elliquis for afib.

## 2017-11-01 NOTE — ED Notes (Signed)
Patient verbalizes understanding of discharge instructions. Opportunity for questioning and answers were provided. Pt discharged from ED. 

## 2017-11-17 ENCOUNTER — Other Ambulatory Visit: Payer: Self-pay | Admitting: Internal Medicine

## 2017-11-28 ENCOUNTER — Encounter (HOSPITAL_COMMUNITY): Payer: Self-pay

## 2017-11-28 ENCOUNTER — Emergency Department (HOSPITAL_COMMUNITY): Payer: BLUE CROSS/BLUE SHIELD

## 2017-11-28 ENCOUNTER — Emergency Department (HOSPITAL_COMMUNITY)
Admission: EM | Admit: 2017-11-28 | Discharge: 2017-11-28 | Disposition: A | Discharge status: Home / Self Care | Payer: BLUE CROSS/BLUE SHIELD | Attending: Emergency Medicine | Admitting: Emergency Medicine

## 2017-11-28 ENCOUNTER — Other Ambulatory Visit: Payer: Self-pay

## 2017-11-28 DIAGNOSIS — Z794 Long term (current) use of insulin: Secondary | ICD-10-CM | POA: Diagnosis not present

## 2017-11-28 DIAGNOSIS — Z79899 Other long term (current) drug therapy: Secondary | ICD-10-CM | POA: Insufficient documentation

## 2017-11-28 DIAGNOSIS — R1031 Right lower quadrant pain: Secondary | ICD-10-CM | POA: Diagnosis present

## 2017-11-28 DIAGNOSIS — E1165 Type 2 diabetes mellitus with hyperglycemia: Secondary | ICD-10-CM | POA: Insufficient documentation

## 2017-11-28 DIAGNOSIS — N281 Cyst of kidney, acquired: Secondary | ICD-10-CM | POA: Insufficient documentation

## 2017-11-28 DIAGNOSIS — I10 Essential (primary) hypertension: Secondary | ICD-10-CM | POA: Insufficient documentation

## 2017-11-28 DIAGNOSIS — E1142 Type 2 diabetes mellitus with diabetic polyneuropathy: Secondary | ICD-10-CM | POA: Insufficient documentation

## 2017-11-28 DIAGNOSIS — R109 Unspecified abdominal pain: Secondary | ICD-10-CM

## 2017-11-28 LAB — URINALYSIS, ROUTINE W REFLEX MICROSCOPIC
Bilirubin Urine: NEGATIVE
Ketones, ur: NEGATIVE mg/dL
Nitrite: NEGATIVE
PROTEIN: 100 mg/dL — AB
Specific Gravity, Urine: 1.022 (ref 1.005–1.030)
pH: 5 (ref 5.0–8.0)

## 2017-11-28 LAB — HEPATIC FUNCTION PANEL
ALK PHOS: 131 U/L — AB (ref 38–126)
ALT: 14 U/L (ref 0–44)
AST: 15 U/L (ref 15–41)
Albumin: 3 g/dL — ABNORMAL LOW (ref 3.5–5.0)
BILIRUBIN TOTAL: 0.3 mg/dL (ref 0.3–1.2)
Bilirubin, Direct: <0.1 mg/dL (ref 0.0–0.2)
Total Protein: 6.8 g/dL (ref 6.5–8.1)

## 2017-11-28 LAB — BASIC METABOLIC PANEL
Anion gap: 10 (ref 5–15)
BUN: 28 mg/dL — AB (ref 8–23)
CHLORIDE: 101 mmol/L (ref 98–111)
CO2: 21 mmol/L — AB (ref 22–32)
CREATININE: 1.45 mg/dL — AB (ref 0.44–1.00)
Calcium: 8.8 mg/dL — ABNORMAL LOW (ref 8.9–10.3)
GFR calc Af Amer: 42 mL/min — ABNORMAL LOW (ref >60)
GFR calc non Af Amer: 36 mL/min — ABNORMAL LOW (ref >60)
Glucose, Bld: 348 mg/dL — ABNORMAL HIGH (ref 70–99)
Potassium: 4.5 mmol/L (ref 3.5–5.1)
Sodium: 132 mmol/L — ABNORMAL LOW (ref 135–145)

## 2017-11-28 LAB — CBC
HEMATOCRIT: 39.7 % (ref 36.0–46.0)
HEMOGLOBIN: 12 g/dL (ref 12.0–15.0)
MCH: 23.9 pg — AB (ref 26.0–34.0)
MCHC: 30.2 g/dL (ref 30.0–36.0)
MCV: 78.9 fL — AB (ref 80.0–100.0)
Platelets: 335 10*3/uL (ref 150–400)
RBC: 5.03 MIL/uL (ref 3.87–5.11)
RDW: 13.2 % (ref 11.5–15.5)
WBC: 12.9 10*3/uL — ABNORMAL HIGH (ref 4.0–10.5)
nRBC: 0 % (ref 0.0–0.2)

## 2017-11-28 MED ORDER — IOHEXOL 300 MG/ML  SOLN
100.0000 mL | Freq: Once | INTRAMUSCULAR | Status: AC | PRN
  Administered 2017-11-28: 100 mL via INTRAVENOUS

## 2017-11-28 MED ORDER — FENTANYL CITRATE (PF) 100 MCG/2ML IJ SOLN
50.0000 ug | Freq: Once | INTRAMUSCULAR | Status: AC
  Administered 2017-11-28: 50 ug via INTRAVENOUS
  Filled 2017-11-28: qty 2

## 2017-11-28 MED ORDER — MORPHINE SULFATE (PF) 4 MG/ML IV SOLN
4.0000 mg | Freq: Once | INTRAVENOUS | Status: AC
  Administered 2017-11-28: 4 mg via INTRAVENOUS
  Filled 2017-11-28: qty 1

## 2017-11-28 MED ORDER — ONDANSETRON HCL 4 MG/2ML IJ SOLN
4.0000 mg | Freq: Once | INTRAMUSCULAR | Status: AC
  Administered 2017-11-28: 4 mg via INTRAVENOUS
  Filled 2017-11-28: qty 2

## 2017-11-28 MED ORDER — ONDANSETRON HCL 4 MG PO TABS: 4.0000 mg | ORAL_TABLET | Freq: Three times a day (TID) | ORAL | 0 refills | Status: DC | PRN

## 2017-11-28 MED ORDER — HYDROCODONE-ACETAMINOPHEN 5-325 MG PO TABS: 1.0000 | ORAL_TABLET | Freq: Four times a day (QID) | ORAL | 0 refills | Status: DC | PRN

## 2017-11-28 NOTE — ED Notes (Signed)
Out to CT 

## 2017-11-28 NOTE — ED Provider Notes (Signed)
[facility] EMERGENCY DEPARTMENT Provider Note   CSN: [id number] Arrival date & time: 11/28/17  1710     History   Chief Complaint Chief Complaint  Patient presents with  . Abdominal Pain    HPI Donna Booker is a 72 y.o. female presenting for evaluation of abdominal pain.  Patient states that the past days, she has been having gradually worsening right lower quadrant domino pain.  Pain is constant, nothing makes it better or worse.  Patient states pain will come in spasms.  No change with p.o. intake, urination, or bowel movements.  She denies associated fevers, chills, chest pain, shortness of breath, nausea, vomiting, or abnormal bowel movements.  She denies dysuria, hematuria, urinary frequency.  She denies a history of kidney stones.  She denies a history of abdominal problems including UC, Crohn's, IBS, IBD.  She reports history of tubal ligation and umbilical cystectomy, but no other abdominal surgeries.  She has been taking Tylenol for pain without improvement of symptoms.  Patient reports a history of diabetes, hypertension, CHF.   HPI  Past Medical History:  Diagnosis Date  . Diabetes mellitus   . Graves disease   . Hypertension   . Hyperthyroidism     Patient Active Problem List   Diagnosis Date Noted  . Hyperglycemia due to type 2 diabetes mellitus (HCC) 07/14/2017  . Hyponatremia 07/14/2017  . Fall at home, initial encounter 07/14/2017  . Rheumatoid arthritis (HCC) 07/14/2017  . Grief reaction 07/14/2017  . Drug-induced hyperkalemia 07/14/2017  . Myalgia 07/06/2017  . Acute gout 05/15/2017  . Hyperkalemia 05/11/2017  . Acute kidney injury superimposed on CKD (HCC) 05/11/2017  . New onset of congestive heart failure (HCC) 04/21/2017  . Acute on chronic respiratory failure with hypoxia (HCC) 04/21/2017  . Leukocytosis 04/21/2017  . History of atrial fibrillation   . Cough 04/20/2017  . Hyperthyroidism 04/18/2017  . Atrial fibrillation  with RVR (HCC) 04/15/2017  . Routine general medical examination at a health care facility 10/20/2015  . Uncontrolled type 2 diabetes mellitus with diabetic polyneuropathy, with long-term current use of insulin (HCC) 02/25/2015  . Right foot pain 01/28/2015  . Hyperlipidemia 09/02/2014  . Morbid obesity (HCC) 09/02/2014  . HTN (hypertension) 10/04/2012    Past Surgical History:  Procedure Laterality Date  . BREAST SURGERY       OB History   None      Home Medications    Prior to Admission medications   Medication Sig Start Date End Date Taking? Authorizing Provider  amLODipine (NORVASC) 10 MG tablet Take 1 tablet (10 mg total) by mouth daily. 07/18/17  Yes Alwyn Ren, MD  apixaban (ELIQUIS) 5 MG TABS tablet Take 1 tablet (5 mg total) by mouth 2 (two) times daily. 04/20/17  Yes Rizwan, Ladell Heads, MD  BREO ELLIPTA 200-25 MCG/INH AEPB TAKE 1 PUFF BY MOUTH EVERY DAY Patient taking differently: Inhale 1 puff into the lungs daily.  11/19/17  Yes Myrlene Broker, MD  esomeprazole (NEXIUM) 40 MG capsule Take 40 mg by mouth daily before breakfast.   Yes [provider]  furosemide (LASIX) 20 MG tablet Take 20 mg by mouth daily. 09/26/17  Yes [provider]  gabapentin (NEURONTIN) 300 MG capsule Take 1 capsule (300 mg total) by mouth 3 (three) times daily. 06/13/17  Yes Myrlene Broker, MD  glipiZIDE (GLUCOTROL) 5 MG tablet Take 2 tablets (10 mg total) by mouth 2 (two) times daily before a meal. 07/19/17  Yes  Carlus Pavlov, MD  Insulin Detemir (LEVEMIR FLEXTOUCH) 100 UNIT/ML Pen INJECT 35 UNITS INTO THE SKIN DAILY AT 10 PM. Patient taking differently: Inject 35 Units into the skin at bedtime.  07/19/17  Yes Carlus Pavlov, MD  methimazole (TAPAZOLE) 5 MG tablet TAKE 2 TABLETS BY MOUTH TWICE A DAY Patient taking differently: Take 10 mg by mouth 2 (two) times daily.  10/01/17  Yes Carlus Pavlov, MD  metoprolol succinate (TOPROL-XL) 50 MG 24 hr tablet  Take 50 mg by mouth daily. 09/26/17  Yes [provider]  pravastatin (PRAVACHOL) 40 MG tablet Take 1 tablet (40 mg total) by mouth daily at 6 PM. 04/24/17  Yes Alwyn Ren, MD  baclofen (LIORESAL) 10 MG tablet TAKE 1 TABLET BY MOUTH THREE TIMES A DAY Patient not taking: No sig reported 07/30/17   Myrlene Broker, MD  butalbital-acetaminophen-caffeine (FIORICET, ESGIC) [phone] MG tablet Take 1 tablet by mouth every 6 (six) hours as needed for headache. Patient not taking: Reported on 11/28/2017 11/01/17 11/01/18  Raeford Razor, MD  diclofenac sodium (VOLTAREN) 1 % GEL Apply 2 g topically 4 (four) times daily as needed (pain). Patient not taking: Reported on 11/28/2017 04/24/17   Alwyn Ren, MD  HYDROcodone-acetaminophen (NORCO/VICODIN) 5-325 MG tablet Take 1 tablet by mouth every 6 (six) hours as needed for severe pain. 11/28/17   Jahnyla Parrillo, PA-C  LEVEMIR FLEXTOUCH 100 UNIT/ML Pen INJECT 35 UNITS INTO THE SKIN DAILY AT 10 PM. Patient not taking: No sig reported 10/15/17   Carlus Pavlov, MD  LEVEMIR FLEXTOUCH 100 UNIT/ML Pen INJECT 35 UNITS INTO THE SKIN DAILY AT 10 PM. Patient not taking: No sig reported 10/29/17   Carlus Pavlov, MD  methimazole (TAPAZOLE) 5 MG tablet Take 1 tablet (5 mg total) by mouth 2 (two) times daily. Patient not taking: Reported on 11/01/2017 07/19/17   Carlus Pavlov, MD  ondansetron (ZOFRAN) 4 MG tablet Take 1 tablet (4 mg total) by mouth every 8 (eight) hours as needed for nausea or vomiting. 11/28/17   Linard Daft, PA-C  propranolol (INDERAL) 10 MG tablet Take 2 tablets (20 mg total) by mouth 3 (three) times daily. Patient not taking: Reported on 11/28/2017 07/17/17   Alwyn Ren, MD    Family History Family History  Problem Relation Age of Onset  . Diabetes Mellitus II Mother   . Diabetes Mellitus II Father   . Hypertension Father   . Hyperlipidemia Father     Social History Social History    Tobacco Use  . Smoking status: Never Smoker  . Smokeless tobacco: Never Used  Substance Use Topics  . Alcohol use: No  . Drug use: No     Allergies   Tramadol   Review of Systems Review of Systems  Gastrointestinal: Positive for abdominal pain.  All other systems reviewed and are negative.    Physical Exam Updated Vital Signs BP (!) 156/84   Pulse 73   Temp 98.8 F (37.1 C) (Oral)   Resp 16   Ht 5\' 3"  (1.6 m)   Wt 98 kg   SpO2 98%   BMI 38.26 kg/m   Physical Exam  Constitutional: She is oriented to person, place, and time. She appears well-developed and well-nourished.  Elderly female who appears uncomfortable due to pain, nontoxic in appearance  HENT:  Head: Normocephalic and atraumatic.  Eyes: Pupils are equal, round, and reactive to light. Conjunctivae and EOM are normal.  Neck: Normal range of motion. Neck supple.  Cardiovascular: Normal  rate, regular rhythm and intact distal pulses.  Pulmonary/Chest: Effort normal and breath sounds normal. No respiratory distress. She has no wheezes.  Abdominal: Soft. She exhibits no distension and no mass. There is tenderness. There is guarding. There is no rebound.  Tenderness palpation of right lower quadrant and right flank with voluntary guarding.  No rigidity or distention.  Negative rebound.  No signs of peritonitis.  No tenderness palpation elsewhere in the abdomen.  Musculoskeletal: Normal range of motion.  Neurological: She is alert and oriented to person, place, and time.  Skin: Skin is warm and dry. Capillary refill takes less than 2 seconds.  Psychiatric: She has a normal mood and affect.  Nursing note and vitals reviewed.    ED Treatments / Results  Labs (all labs ordered are listed, but only abnormal results are displayed) Labs Reviewed  CBC - Abnormal; Notable for the following components:      Result Value   WBC 12.9 (*)    MCV 78.9 (*)    MCH 23.9 (*)    All other components within normal limits   BASIC METABOLIC PANEL - Abnormal; Notable for the following components:   Sodium 132 (*)    CO2 21 (*)    Glucose, Bld 348 (*)    BUN 28 (*)    Creatinine, Ser 1.45 (*)    Calcium 8.8 (*)    GFR calc non Af Amer 36 (*)    GFR calc Af Amer 42 (*)    All other components within normal limits  HEPATIC FUNCTION PANEL - Abnormal; Notable for the following components:   Albumin 3.0 (*)    Alkaline Phosphatase 131 (*)    All other components within normal limits  URINALYSIS, ROUTINE W REFLEX MICROSCOPIC - Abnormal; Notable for the following components:   APPearance CLOUDY (*)    Glucose, UA >=500 (*)    Hgb urine dipstick SMALL (*)    Protein, ur 100 (*)    Leukocytes, UA TRACE (*)    Bacteria, UA MANY (*)    Squamous Epithelial / LPF >50 (*)    All other components within normal limits  URINE CULTURE    EKG None  Radiology Ct Abdomen Pelvis W Contrast  Result Date: 11/28/2017 CLINICAL DATA:  Abdominal pain for 3 days, history hypertension, diabetes mellitus EXAM: CT ABDOMEN AND PELVIS WITH CONTRAST TECHNIQUE: Multidetector CT imaging of the abdomen and pelvis was performed using the standard protocol following bolus administration of intravenous contrast. Sagittal and coronal MPR images reconstructed from axial data set. CONTRAST:  OMNIPAQUE IOHEXOL 300 MG/ML SOLN IV. No oral contrast. COMPARISON:  05/04/2012 FINDINGS: Lower chest: Lung bases clear Hepatobiliary: Gallbladder and liver normal appearance Pancreas: Normal appearance Spleen: Normal appearance Adrenals/Urinary Tract: Thickening of adrenal glands bilaterally without discrete mass. Multiple BILATERAL renal nodules likely representing cysts. An intermediate attenuation nodule 19 x 19 mm diameter is seen at the posterior aspect of the mid RIGHT kidney, showing no significant change in enhancement between portal venous phase and delayed imaging, question complicated cyst. No urinary tract calcification or dilatation. Bladder  and ureters unremarkable. Stomach/Bowel: Normal appendix. Mobile cecum. Large and small bowel loops otherwise normal appearance. Decompressed stomach with question small hiatal hernia. Vascular/Lymphatic: Atherosclerotic calcifications aorta and iliac arteries without aneurysm. No adenopathy. Reproductive: Unremarkable uterus and adnexa Other: No free air or free fluid. No definite inflammatory process. Foci of subcutaneous infiltration at the anterior abdominal wall bilaterally question medication injection sites. Musculoskeletal: Scattered degenerative disc disease  changes of the thoracolumbar spine. IMPRESSION: No acute intra-abdominal or intrapelvic abnormalities. Multiple renal cysts with an additional intermediate attenuation RIGHT renal nodule 19 x 19 mm without evidence of washout of contrast on delayed images question complicated cyst; recommend follow-up ultrasound characterization. Electronically Signed   By: Ulyses Southward M.D.   On: 11/28/2017 19:59    Procedures Procedures (including critical care time)  Medications Ordered in ED Medications  ondansetron (ZOFRAN) injection 4 mg (has no administration in time range)  morphine 4 MG/ML injection 4 mg (4 mg Intravenous Given 11/28/17 1853)  iohexol (OMNIPAQUE) 300 MG/ML solution 100 mL (100 mLs Intravenous Contrast Given 11/28/17 1936)  fentaNYL (SUBLIMAZE) injection 50 mcg (50 mcg Intravenous Given 11/28/17 2149)     Initial Impression / Assessment and Plan / ED Course  I have reviewed the triage vital signs and the nursing notes.  Pertinent labs & imaging results that were available during my care of the patient were reviewed by me and considered in my medical decision making (see chart for details).     Pt presenting for evaluation of abdominal pain.  Physical exam shows a patient who appears uncomfortable, but nontoxic.  She is afebrile not tachycardic.  Abdominal exam with tenderness palpation the right lower quadrant.  Consider  appendicitis versus kidney stone versus UTI versus pyelo versus gastritis.  Will obtain labs, urine, and CT abdomen pelvis for further evaluation.  Morphine for pain control.  Labs show mild leukocytosis at 12.9.  Kidney function slightly below baseline at 1.45, although similar to baseline of 1.2.  Glucose elevated at 348.  Liver function reassuring.  Urine pending.  CT abdomen pelvis without acute findings that explain patient's pain.  Shows multiple cysts of the right kidney, recommends follow-up with ultrasound.  No signs of intra-abdominal infection, perforation, obstruction, or surgical abd. UA pending. Case discussed with attending, Dr. Ranae Palms evaluated the pt.    UA without obvious infection.  Sent for culture.  Discussed with patient that at this time, there is no emergent or life-threatening cause for her symptoms.  Discussed symptomatic treatment with Tylenol.  Norco as needed for severe breakthrough pain, Zofran as needed for nausea or vomiting.  Up with PCP for further evaluation of symptoms and further evaluation of her kidneys.  At this time, patient appears safe for discharge.  Return precautions given.  Patient states she understands and agrees to plan.  Final Clinical Impressions(s) / ED Diagnoses   Final diagnoses:  Renal cyst  Acute abdominal pain    ED Discharge Orders         Ordered    ondansetron (ZOFRAN) 4 MG tablet  Every 8 hours PRN     11/28/17 2228    HYDROcodone-acetaminophen (NORCO/VICODIN) 5-325 MG tablet  Every 6 hours PRN     11/28/17 2228           Alveria Apley, PA-C 11/28/17 2232    Loren Racer, MD 11/28/17 2310

## 2017-11-28 NOTE — ED Notes (Signed)
Patient verbalizes understanding of discharge instructions. Opportunity for questioning and answers were provided. Armband removed by staff, pt discharged from ED.  

## 2017-11-28 NOTE — Discharge Instructions (Addendum)
Use tylenol for mild to moderate pain.  Use as needed for severe breakthrough pain.  Have caution, as this may make you tired or groggy.  Do not drive or operate heavy machinery while taking this medicine.  This may also cause nausea, use Zofran as needed for nausea or vomiting. Follow up with your primary care doctor for further evaluation of your symptoms and for follow-up of your kidney cysts.  Return to the emergency room if you develop high fevers, persistent vomiting despite medication, severe worsening abdominal pain, or any new or concerning symptoms.

## 2017-11-28 NOTE — ED Triage Notes (Signed)
Pt c/o lower ABD pain that radiates to her flank. Per pt, pain started Monday and has gotten worse. Pt denies n/v or fevers.

## 2017-11-30 LAB — URINE CULTURE

## 2017-12-04 ENCOUNTER — Other Ambulatory Visit (INDEPENDENT_AMBULATORY_CARE_PROVIDER_SITE_OTHER): Payer: BLUE CROSS/BLUE SHIELD

## 2017-12-04 ENCOUNTER — Ambulatory Visit: Payer: BLUE CROSS/BLUE SHIELD | Admitting: Internal Medicine

## 2017-12-04 ENCOUNTER — Encounter: Payer: Self-pay | Admitting: Internal Medicine

## 2017-12-04 VITALS — BP 130/80 | HR 84 | Temp 98.0°F | Ht 63.0 in | Wt 215.0 lb

## 2017-12-04 DIAGNOSIS — E1142 Type 2 diabetes mellitus with diabetic polyneuropathy: Secondary | ICD-10-CM | POA: Diagnosis not present

## 2017-12-04 DIAGNOSIS — E1165 Type 2 diabetes mellitus with hyperglycemia: Secondary | ICD-10-CM

## 2017-12-04 DIAGNOSIS — Z794 Long term (current) use of insulin: Secondary | ICD-10-CM

## 2017-12-04 DIAGNOSIS — E0590 Thyrotoxicosis, unspecified without thyrotoxic crisis or storm: Secondary | ICD-10-CM

## 2017-12-04 DIAGNOSIS — R1031 Right lower quadrant pain: Secondary | ICD-10-CM | POA: Diagnosis not present

## 2017-12-04 DIAGNOSIS — IMO0002 Reserved for concepts with insufficient information to code with codable children: Secondary | ICD-10-CM

## 2017-12-04 LAB — TSH: TSH: 10.31 u[IU]/mL — ABNORMAL HIGH (ref 0.35–4.50)

## 2017-12-04 LAB — T4, FREE: Free T4: 0.57 ng/dL — ABNORMAL LOW (ref 0.60–1.60)

## 2017-12-04 LAB — T3, FREE: T3 FREE: 2.8 pg/mL (ref 2.3–4.2)

## 2017-12-04 MED ORDER — PREDNISONE 20 MG PO TABS: 40.0000 mg | ORAL_TABLET | Freq: Every day | ORAL | 0 refills | Status: DC

## 2017-12-04 MED ORDER — SENNOSIDES-DOCUSATE SODIUM 8.6-50 MG PO TABS: 1.0000 | ORAL_TABLET | Freq: Two times a day (BID) | ORAL | 11 refills | Status: DC

## 2017-12-04 NOTE — Patient Instructions (Signed)
We have sent in the prednisone to see if this can calm down the pain.   You need to see the endocrine doctor about the diabetes and thyroid.   Make sure to start checking the sugars as high sugar levels can bother your stomach and cause pain.

## 2017-12-04 NOTE — Progress Notes (Signed)
   Subjective:    Patient ID: Donna Booker, female    DOB: 01/20/1945, 72 y.o.   MRN: [id number]  HPI The patient is a 72 YO female coming in for ER follow up (in for abdominal pain and CT abdomen/pelvis showed several renal cysts one of which are indeterminate, recommended [place] follow up). She is still having the abdominal pain. This is located at the very bottom of her stomach and radiates around to the back. She is now wondering if it is related to her rheumatoid arthritis. She is seeing rheumatology but they have started her on some kind of infusion. She does not think it is helping. The cold weather has made her feel worse. Denies joints red or swollen. She did not get an answer as to cause. She denies diarrhea or constipation. Denies fevers or chills. Denies blood in stool. Denies nausea or vomiting. She is taking tylenol for pain right now and it helps the pain for awhile.   PMH, [facility], social history reviewed and updated.   Review of Systems  Constitutional: Positive for activity change. Negative for appetite change, fatigue, fever and unexpected weight change.  HENT: Negative.   Eyes: Negative.   Respiratory: Negative for cough, chest tightness and shortness of breath.   Cardiovascular: Negative for chest pain, palpitations and leg swelling.  Gastrointestinal: Positive for abdominal pain. Negative for abdominal distention, constipation, diarrhea, nausea and vomiting.  Musculoskeletal: Positive for arthralgias, back pain, gait problem and myalgias. Negative for joint swelling, neck pain and neck stiffness.  Skin: Negative.   Psychiatric/Behavioral: Negative.       Objective:   Physical Exam  Constitutional: She is oriented to person, place, and time. She appears well-developed and well-nourished.  Appears uncomfortable.  HENT:  Head: Normocephalic and atraumatic.  Eyes: EOM are normal.  Neck: Normal range of motion.  Cardiovascular: Normal rate and regular rhythm.    Pulmonary/Chest: Effort normal and breath sounds normal. No respiratory distress. She has no wheezes. She has no rales.  Abdominal: Soft. Bowel sounds are normal. She exhibits no distension. There is no tenderness. There is no rebound.  Pain is very low abdomen and may be more coming from the hip joint and then radiates to the low back and SI regions.   Musculoskeletal: She exhibits tenderness. She exhibits no edema.  Neurological: She is alert and oriented to person, place, and time. Coordination normal.  Skin: Skin is warm and dry.   Vitals:   12/04/17 1427  BP: 130/80  Pulse: 84  Temp: 98 F (36.7 C)  TempSrc: Oral  SpO2: 97%  Weight: 215 lb (97.5 kg)  Height: 5\' 3"  (1.6 m)      Assessment & Plan:

## 2017-12-05 DIAGNOSIS — R109 Unspecified abdominal pain: Secondary | ICD-10-CM | POA: Insufficient documentation

## 2017-12-05 NOTE — Assessment & Plan Note (Signed)
Has had severely uncontrolled diabetes for some time and given the sugars of 350s from the ER suspect that this could be contributing to some of her abdominal pain. Discussed this with her and strongly recommended that she start checking blood sugar (she does not now) and return to endo (as she is overdue for visit with them).

## 2017-12-05 NOTE — Assessment & Plan Note (Signed)
Has not followed up with endo and had dose change about 4-5 months ago. Needs TSH, free T4 and free T3 today. If abnormal will forward to endo for adjustment of her methimazole.

## 2017-12-05 NOTE — Assessment & Plan Note (Signed)
Suspect that this could be related to hip pain and not abdomen pain. CT abdomen without findings. Pain in the low back and SI joints on exam. Rx for prednisone given her RA to help treat this likely flare. She has visit with them soon to hopefully adjust her maintenance medication to help with overall control as she is not satisfied with overall control.

## 2017-12-20 ENCOUNTER — Other Ambulatory Visit: Payer: Self-pay | Admitting: Internal Medicine

## 2017-12-24 ENCOUNTER — Ambulatory Visit: Payer: Self-pay

## 2017-12-24 NOTE — Telephone Encounter (Signed)
fyi

## 2017-12-24 NOTE — Telephone Encounter (Signed)
Patient calling for lab results from 12/04/17.  Related that Dr.  Okey Dupre that thyroid medication may be slightly too much.  Patient  States that she has an appointment with Dr.  Elvera Lennox this Friday . She states that she will tel  The Dr.  Then.

## 2017-12-28 ENCOUNTER — Ambulatory Visit: Payer: BLUE CROSS/BLUE SHIELD | Admitting: Internal Medicine

## 2017-12-28 ENCOUNTER — Encounter: Payer: Self-pay | Admitting: Internal Medicine

## 2017-12-28 ENCOUNTER — Telehealth: Payer: Self-pay | Admitting: Internal Medicine

## 2017-12-28 VITALS — BP 128/88 | HR 70 | Ht 63.0 in | Wt 216.0 lb

## 2017-12-28 DIAGNOSIS — Z794 Long term (current) use of insulin: Secondary | ICD-10-CM | POA: Diagnosis not present

## 2017-12-28 DIAGNOSIS — E0590 Thyrotoxicosis, unspecified without thyrotoxic crisis or storm: Secondary | ICD-10-CM | POA: Diagnosis not present

## 2017-12-28 DIAGNOSIS — E1142 Type 2 diabetes mellitus with diabetic polyneuropathy: Secondary | ICD-10-CM | POA: Diagnosis not present

## 2017-12-28 DIAGNOSIS — E1165 Type 2 diabetes mellitus with hyperglycemia: Secondary | ICD-10-CM

## 2017-12-28 DIAGNOSIS — IMO0002 Reserved for concepts with insufficient information to code with codable children: Secondary | ICD-10-CM

## 2017-12-28 LAB — POCT GLYCOSYLATED HEMOGLOBIN (HGB A1C): Hemoglobin A1C: 13.1 % — AB (ref 4.0–5.6)

## 2017-12-28 MED ORDER — DULAGLUTIDE 1.5 MG/0.5ML ~~LOC~~ SOAJ: 1.5000 mg | SUBCUTANEOUS | 3 refills | Status: DC

## 2017-12-28 MED ORDER — PREDNISONE 20 MG PO TABS: 40.0000 mg | ORAL_TABLET | Freq: Every day | ORAL | 0 refills | Status: DC

## 2017-12-28 MED ORDER — INSULIN DETEMIR 100 UNIT/ML FLEXPEN: PEN_INJECTOR | SUBCUTANEOUS | 5 refills | Status: DC

## 2017-12-28 NOTE — Telephone Encounter (Signed)
I don't see that she has ever been on allopurinol and this should not be started during a flare. Rx for prednisone sent in and needs visit 2-3 weeks after flare over to have labs and discuss long term treatment of her gout.

## 2017-12-28 NOTE — Patient Instructions (Addendum)
Stop Methimazole.  Please continue: - Propranolol 20 mg 3x a day  Also, continue: - Glipizide 10 mg 2x a day before meals  Increase: - Levemir to 40 units at night   Please start: - Trulicity 1.5 mg weekly.   START CHECKING SUGARS 1-2x a day.  Come back for a follow up appt in 1.5 month.

## 2017-12-28 NOTE — Telephone Encounter (Signed)
Looks like we prescribed prednisone for her last gout flare

## 2017-12-28 NOTE — Telephone Encounter (Signed)
Patient informed of MD response and stated understanding  

## 2017-12-28 NOTE — Telephone Encounter (Signed)
Is she having a gout flare?

## 2017-12-28 NOTE — Telephone Encounter (Signed)
Patient states the name of the medication is Allopurinol. She is requesting a refill as she is currently experiencing a gout flair.

## 2017-12-28 NOTE — Addendum Note (Signed)
Addended by: Hillard Danker A on: 12/28/2017 01:52 PM   Modules accepted: Orders

## 2017-12-28 NOTE — Telephone Encounter (Signed)
Patient came by the office requesting a refill on the medication that was prescribed for gout. Patient did not know the same of the medication. She states that [facility] was suppose to fax over a refill request.  Patient has been informed that our fax number changed so that may be why we have not received it.   Can this be refill for the patient? Patient would like a call back.

## 2017-12-28 NOTE — Progress Notes (Signed)
Patient ID: Donna Booker, female   DOB: 1945/10/05, 72 y.o.   MRN: [id number]  HPI: Donna Booker is a 72 y.o.-year-old female, returning for f/u for DM2, dx in 1987, insulin-dependent since 2006, uncontrolled, with complications (PN, stage 2-3 CKD)and new dx of Graves ds. Last visit 5 months ago.  Graves ds  - diagnosed during admission for A. fib with RVR in 04/2017  Reviewed TFTs - TSH very high at last check by PCP - I was not aware: Lab Results  Component Value Date   TSH 10.31 (H) 12/04/2017   TSH <0.010 (L) 07/14/2017   TSH <0.01 (L) 07/06/2017   TSH <0.01 Repeated and verified X2. (L) 05/10/2017   TSH <0.010 (L) 04/16/2017   Lab Results  Component Value Date   FREET4 0.57 (L) 12/04/2017   FREET4 0.71 (L) 07/15/2017   FREET4 0.72 07/06/2017   FREET4 1.15 05/10/2017   FREET4 5.15 (H) 04/16/2017   Lab Results  Component Value Date   T3FREE 2.8 12/04/2017   T3FREE 2.1 07/15/2017   T3FREE 4.2 05/10/2017   T3FREE 18.5 (H) 04/16/2017   Graves AB's were elevated: Lab Results  Component Value Date   TSI 8.02 (H) 04/16/2017   She was started on methimazole 10 mg 2x a day and atenolol 50 mg 3x a day.  At last visit, as her TFTs were significantly improved, we decreased the methimazole to 5 mg 2X a day, but now on 5 mg daily (? When changed - by PCP? -Patient cannot remember exactly when she started her prescription for methimazole only mentioned to take it once a day...).  Atenolol was switched to propranolol 20 mg 3X a day.  She believes she is on this now.  Pt denies: - feeling nodules in neck. - hoarseness - dysphagia - choking - SOB with lying down  No FH of thyroid disease. No FH of thyroid cancer. No h/o radiation tx to head or neck.  No seaweed or kelp. No recent contrast studies. No herbal supplements. No Biotin use. No recent steroids use.   DM2:  Last hemoglobin A1c was: Lab Results  Component Value Date   HGBA1C 9.8 (H) 07/14/2017   HGBA1C 8.0  02/01/2016   HGBA1C 8.3 (H) 10/20/2015   Pt wason a regimen of: - Metformin 1000 mg in am - VGo 30 - initially with 1-2 meal clicks, now off these - started 2015 She was on Lantus 30, but taken off to start the VGo. She was on Victoza. She tried Bydureon (02/2013-10/2014) >> made her hungry, did not lower sugars. She also tried Trulicity 1.5 mg (10/2013-04/2014) >> did not lower her sugars.  She is now on: - Levemir 25 >> 35 units after dinner - Glipizide 5 >> 10 mg 2x a day before b'fast and dinner.   Patient is still not checking sugars. - am: 99-138, 161 >> 94-128 >> ? - 2h after b'fast: n/c - before lunch: 221 (cantaloupe) >> n/c - 2h after lunch: n/c >> 200 >> n/c - before dinner: 119, 130-151 >> 120-125 >> ? - 2h after dinner: n/c >> 189 >> n/c - bedtime: 192, 265 >> 130-137 >> ? - nighttime: n/c Lowest sugar was 94 >> ?; she has hypoglycemia awareness in the 70s. Highest sugar was 298 >> ?  Glucometer: AccuChek  -+ CKD, last BUN/creatinine:  Lab Results  Component Value Date   BUN 28 (H) 11/28/2017   CREATININE 1.45 (H) 11/28/2017  On Olmesartan. -+ HL; last  set of lipids: Lab Results  Component Value Date   CHOL 248 (H) 10/20/2015   HDL 38.70 (L) 10/20/2015   LDLCALC 55 09/01/2014   LDLDIRECT 161.0 10/20/2015   TRIG 222.0 (H) 10/20/2015   CHOLHDL 6 10/20/2015  On Pravastatin. - last eye exam was in 04/2017: No DR - no  numbness and tingling in her feet.   Hyperkalemia: - Possibly previously from ACE inhibitor, potassium supplementation +/-beta-blocker - After an initial K of 7.5 in 07/2017 >> K levels normalized after stopping ACEI and K supplements Lab Results  Component Value Date   K 4.5 11/28/2017   K 4.5 11/01/2017   K 4.3 11/01/2017   K 4.4 08/13/2017   K 4.1 07/20/2017   Of note, a cosyntropin stimulation test was normal and also aldosterone and renin were normal while in the hospital.   She had AKI on admission then.  Her son died in 2017-06-30 -he had severe heart failure.  ROS: Constitutional: + weight gain/no weight loss, no fatigue, no subjective hyperthermia, no subjective hypothermia Eyes: no blurry vision, no xerophthalmia ENT: no sore throat, + see HPI Cardiovascular: no CP/no SOB/no palpitations/no leg swelling Respiratory: no cough/no SOB/no wheezing Gastrointestinal: no N/no V/no D/no C/no acid reflux Musculoskeletal: + muscle aches/no joint aches Skin: no rashes, + hair loss Neurological: no tremors/no numbness/no tingling/no dizziness  I reviewed pt's medications, allergies, PMH, social hx, family hx, and changes were documented in the history of present illness. Otherwise, unchanged from my initial visit note.  Past Medical History:  Diagnosis Date  . Diabetes mellitus   . Graves disease   . Hypertension   . Hyperthyroidism    Past Surgical History:  Procedure Laterality Date  . BREAST SURGERY     Social History   Social History  . Marital Status: Married    Spouse Name: N/A  . Number of Children: 1   Occupational History  . 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 Smoking status: Never Smoker   . Smokeless tobacco: Not on file  . Alcohol Use: No  . Drug Use: No   Current Outpatient Medications on File Prior to Visit  Medication Sig Dispense Refill  . amLODipine (NORVASC) 10 MG tablet Take 1 tablet (10 mg total) by mouth daily. 30 tablet 0  . apixaban (ELIQUIS) 5 MG TABS tablet Take 1 tablet (5 mg total) by mouth 2 (two) times daily. 60 tablet 0  . baclofen (LIORESAL) 10 MG tablet TAKE 1 TABLET BY MOUTH THREE TIMES A DAY (Patient not taking: No sig reported) 60 tablet 0  . BREO ELLIPTA 200-25 MCG/INH AEPB TAKE 1 PUFF BY MOUTH EVERY DAY (Patient taking differently: Inhale 1 puff into the lungs daily. ) 180 each 1  . butalbital-acetaminophen-caffeine (FIORICET, ESGIC) 50-325-40 MG tablet Take 1 tablet by mouth every 6 (six) hours as needed for headache. (Patient not taking: Reported  on 11/28/2017) 20 tablet 0  . diclofenac sodium (VOLTAREN) 1 % GEL Apply 2 g topically 4 (four) times daily as needed (pain). (Patient not taking: Reported on 11/28/2017)    . esomeprazole (NEXIUM) 40 MG capsule Take 40 mg by mouth daily before breakfast.    . furosemide (LASIX) 20 MG tablet Take 20 mg by mouth daily.  2  . gabapentin (NEURONTIN) 300 MG capsule TAKE 1 CAPSULE BY MOUTH THREE TIMES A DAY 270 capsule 1  . glipiZIDE (GLUCOTROL) 5 MG tablet Take 2 tablets (10 mg total) by mouth  2 (two) times daily before a meal. 120 tablet 11  . HYDROcodone-acetaminophen (NORCO/VICODIN) 5-325 MG tablet Take 1 tablet by mouth every 6 (six) hours as needed for severe pain. 6 tablet 0  . Insulin Detemir (LEVEMIR FLEXTOUCH) 100 UNIT/ML Pen INJECT 35 UNITS INTO THE SKIN DAILY AT 10 PM. (Patient taking differently: Inject 35 Units into the skin at bedtime. ) 30 pen 3  . LEVEMIR FLEXTOUCH 100 UNIT/ML Pen INJECT 35 UNITS INTO THE SKIN DAILY AT 10 PM. 30 mL 1  . methimazole (TAPAZOLE) 5 MG tablet Take 1 tablet (5 mg total) by mouth 2 (two) times daily. 120 tablet 5  . methimazole (TAPAZOLE) 5 MG tablet TAKE 2 TABLETS BY MOUTH TWICE A DAY (Patient taking differently: Take 10 mg by mouth 2 (two) times daily. ) 360 tablet 1  . metoprolol succinate (TOPROL-XL) 50 MG 24 hr tablet Take 50 mg by mouth daily.  3  . ondansetron (ZOFRAN) 4 MG tablet Take 1 tablet (4 mg total) by mouth every 8 (eight) hours as needed for nausea or vomiting. 12 tablet 0  . pravastatin (PRAVACHOL) 40 MG tablet Take 1 tablet (40 mg total) by mouth daily at 6 PM. 30 tablet 1  . predniSONE (DELTASONE) 20 MG tablet Take 2 tablets (40 mg total) by mouth daily with breakfast. 10 tablet 0  . propranolol (INDERAL) 10 MG tablet Take 2 tablets (20 mg total) by mouth 3 (three) times daily. 90 tablet 1  . senna-docusate (SENOKOT-S) 8.6-50 MG tablet Take 1 tablet by mouth 2 (two) times daily. 60 tablet 11   No current facility-administered medications on  file prior to visit.    Allergies  Allergen Reactions  . Tramadol Nausea And Vomiting    FH - see HPI  PE: BP 128/88   Pulse 70   Ht 5\' 3"  (1.6 m) Comment: measured  Wt 216 lb (98 kg)   SpO2 97%   BMI 38.26 kg/m  Wt Readings from Last 3 Encounters:  12/28/17 216 lb (98 kg)  12/04/17 215 lb (97.5 kg)  11/28/17 216 lb (98 kg)   Constitutional: overweight, in NAD Eyes: PERRLA, EOMI, no exophthalmos ENT: moist mucous membranes, + B thyromegaly, no cervical lymphadenopathy Cardiovascular: RRR, No MRG Respiratory: CTA B Gastrointestinal: abdomen soft, NT, ND, BS+ Musculoskeletal: no deformities, strength intact in all 4 Skin: moist, warm, no rashes Neurological: no tremor with outstretched hands, DTR normal in all 4  ASSESSMENT: 1. 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  Graves' disease  3.  Hyperkalemia - in 07/2017, she had a critical value of potassium of 7.5.  She was admitted with high potassium since then >> Lasix, potassium supplementation, and lisinopril were stopped. - A cosyntropin stimulation test performed in the hospital was normal, ruling out primary adrenal insufficiency as a cause for hyperkalemia - Aldosterone and renin levels were normal during her last hospitalization - On her medication list, I do not see any possible culprits,   Other than possibly propranolol - I suggested a referral to nephrology  if her potassium continues to remain elevated after stopping ACE inhibitor and potassium supplementation  4. HL  PLAN:  1. Patient with longstanding, uncontrolled, type 2 diabetes, on basal insulin and glipizide only.  Metformin and Victoza were stopped earlier in the year.  Unfortunately, at last visit, she was not checking sugars and I could not make further adjustments in her regimen other than increasing the Levemir and the glipizide  dose.  I strongly advised her to start checking sugars.  She did not do so.   Therefore, it is almost impossible to adjust her regimen. -I again strongly advised her to start checking sugars, and if she does not, I advised her that I cannot see her anymore since I cannot help her -For now, we will continue glipizide, will increase Levemir and will try to add a weekly GLP-1 receptor agonist.  She tolerated well Trulicity in the past we will start at target dose. - I suggested to:   Patient Instructions  Stop Methimazole.  Please continue: - Propranolol 20 mg 3x a day  Also, continue: - Glipizide 10 mg 2x a day before meals  Increase: - Levemir to 40 units at night   Please start: - Trulicity 1.5 mg weekly.   START CHECKING SUGARS 1-2x a day.  Come back for a follow up appt in 1.5 month.  - today, HbA1c is 13.1% (HIGHER!) - continue checking sugars at different times of the day - check 2x a day, rotating checks - advised for yearly eye exams >> she is UTD - Return to clinic in 3 mo with sugar log    2.  Graves' disease -Hyperthyroidism diagnosed after she was admitted with A. fib with RVR and then acute pulmonary edema in 04/2017 -Most likely Graves' disease due to elevated TSI antibodies -She had symptoms of weight loss, diarrhea, but denied tremors, palpitations, anxiety, heat intolerance.  Since last visit, she had more than 15 pounds weight gain -Before last visit, she was on methimazole 10 mg twice a day and atenolol 50 mg twice a day and then propranolol 20 mg 3 times a day.  At last visit, we decreased the methimazole dose to 5 mg twice a day.  I asked her to come back for a repeat set of labs in 1.5 months, but she did not return... Since last visit, apparently, her methimazole dose was decreased to 5 mg once a day, which she continues now.  She only went by the prescription and she does not know who exactly decrease the dose.  She recently had labs by PCP and a TSH returned elevated, above 10.  I was not aware of this result... We will stop her  methimazole now and have her come back for a visit and labs in 1.5 months.  3. Hyperkalemia - reviewed latest K levels: normal  4. HL - Reviewed latest lipid panel: LDL very high, higher than before - needs a new lipid panel-will check at next visit, hopefully after her diabetes improves Lab Results  Component Value Date   CHOL 248 (H) 10/20/2015   HDL 38.70 (L) 10/20/2015   LDLCALC 55 09/01/2014   LDLDIRECT 161.0 10/20/2015   TRIG 222.0 (H) 10/20/2015   CHOLHDL 6 10/20/2015  - Continues the pravastatin without side effects.   Carlus Pavlov, MD PhD [facility] Endocrinology

## 2017-12-30 DIAGNOSIS — M109 Gout, unspecified: Secondary | ICD-10-CM | POA: Diagnosis not present

## 2017-12-30 DIAGNOSIS — M129 Arthropathy, unspecified: Secondary | ICD-10-CM | POA: Diagnosis not present

## 2017-12-30 DIAGNOSIS — E1165 Type 2 diabetes mellitus with hyperglycemia: Secondary | ICD-10-CM | POA: Diagnosis not present

## 2017-12-31 ENCOUNTER — Telehealth: Payer: Self-pay | Admitting: Internal Medicine

## 2017-12-31 MED ORDER — PROPRANOLOL HCL 10 MG PO TABS: 20.0000 mg | ORAL_TABLET | Freq: Three times a day (TID) | ORAL | 1 refills | Status: DC

## 2017-12-31 NOTE — Telephone Encounter (Signed)
Please advise on refill.

## 2017-12-31 NOTE — Addendum Note (Signed)
Addended by: Darliss Ridgel I on: 12/31/2017 04:56 PM   Modules accepted: Orders

## 2017-12-31 NOTE — Telephone Encounter (Signed)
RX sent

## 2017-12-31 NOTE — Telephone Encounter (Signed)
MEDICATION: propenalol  PHARMACY:  [facility] on [address]  IS THIS A 90 DAY SUPPLY : yes  IS PATIENT OUT OF MEDICATION: yes  IF NOT; HOW MUCH IS LEFT:   LAST APPOINTMENT DATE: @12 /27/2019  NEXT APPOINTMENT DATE:@3 /30/2020  DO WE HAVE YOUR PERMISSION TO LEAVE A DETAILED MESSAGE:  OTHER COMMENTS: pt went to pick it up and the dosing is incorrect 3 times daily   **Let patient know to contact pharmacy at the end of the day to make sure medication is ready. **  ** Please notify patient to allow 48-72 hours to process**  **Encourage patient to contact the pharmacy for refills or they can request refills through [facility]**

## 2017-12-31 NOTE — Telephone Encounter (Signed)
OK to refill

## 2018-01-07 DIAGNOSIS — M0579 Rheumatoid arthritis with rheumatoid factor of multiple sites without organ or systems involvement: Secondary | ICD-10-CM | POA: Diagnosis not present

## 2018-01-07 DIAGNOSIS — R079 Chest pain, unspecified: Secondary | ICD-10-CM | POA: Diagnosis not present

## 2018-01-07 DIAGNOSIS — R700 Elevated erythrocyte sedimentation rate: Secondary | ICD-10-CM | POA: Diagnosis not present

## 2018-01-07 DIAGNOSIS — M130 Polyarthritis, unspecified: Secondary | ICD-10-CM | POA: Diagnosis not present

## 2018-01-07 DIAGNOSIS — R350 Frequency of micturition: Secondary | ICD-10-CM | POA: Diagnosis not present

## 2018-01-08 ENCOUNTER — Other Ambulatory Visit: Payer: Self-pay | Admitting: Physician Assistant

## 2018-01-08 DIAGNOSIS — R102 Pelvic and perineal pain: Secondary | ICD-10-CM

## 2018-01-16 ENCOUNTER — Other Ambulatory Visit: Payer: BLUE CROSS/BLUE SHIELD

## 2018-01-24 ENCOUNTER — Other Ambulatory Visit: Payer: Self-pay | Admitting: Internal Medicine

## 2018-01-31 DIAGNOSIS — M0579 Rheumatoid arthritis with rheumatoid factor of multiple sites without organ or systems involvement: Secondary | ICD-10-CM | POA: Diagnosis not present

## 2018-02-11 ENCOUNTER — Other Ambulatory Visit: Payer: Self-pay | Admitting: Cardiology

## 2018-02-28 DIAGNOSIS — M0579 Rheumatoid arthritis with rheumatoid factor of multiple sites without organ or systems involvement: Secondary | ICD-10-CM | POA: Diagnosis not present

## 2018-03-03 ENCOUNTER — Other Ambulatory Visit: Payer: Self-pay | Admitting: Cardiology

## 2018-03-13 DIAGNOSIS — Z803 Family history of malignant neoplasm of breast: Secondary | ICD-10-CM | POA: Diagnosis not present

## 2018-03-13 DIAGNOSIS — Z1231 Encounter for screening mammogram for malignant neoplasm of breast: Secondary | ICD-10-CM | POA: Diagnosis not present

## 2018-03-13 LAB — HM MAMMOGRAPHY

## 2018-03-19 ENCOUNTER — Ambulatory Visit: Payer: BLUE CROSS/BLUE SHIELD | Admitting: Internal Medicine

## 2018-03-19 ENCOUNTER — Other Ambulatory Visit (INDEPENDENT_AMBULATORY_CARE_PROVIDER_SITE_OTHER): Payer: BLUE CROSS/BLUE SHIELD

## 2018-03-19 ENCOUNTER — Other Ambulatory Visit: Payer: Self-pay

## 2018-03-19 ENCOUNTER — Encounter: Payer: Self-pay | Admitting: Internal Medicine

## 2018-03-19 VITALS — BP 146/80 | HR 94 | Temp 98.4°F | Ht 63.0 in | Wt 218.0 lb

## 2018-03-19 DIAGNOSIS — E1165 Type 2 diabetes mellitus with hyperglycemia: Secondary | ICD-10-CM

## 2018-03-19 DIAGNOSIS — Z794 Long term (current) use of insulin: Secondary | ICD-10-CM | POA: Diagnosis not present

## 2018-03-19 DIAGNOSIS — IMO0002 Reserved for concepts with insufficient information to code with codable children: Secondary | ICD-10-CM

## 2018-03-19 DIAGNOSIS — E1169 Type 2 diabetes mellitus with other specified complication: Secondary | ICD-10-CM

## 2018-03-19 DIAGNOSIS — B379 Candidiasis, unspecified: Secondary | ICD-10-CM | POA: Insufficient documentation

## 2018-03-19 DIAGNOSIS — I10 Essential (primary) hypertension: Secondary | ICD-10-CM | POA: Diagnosis not present

## 2018-03-19 DIAGNOSIS — E1142 Type 2 diabetes mellitus with diabetic polyneuropathy: Secondary | ICD-10-CM

## 2018-03-19 DIAGNOSIS — E785 Hyperlipidemia, unspecified: Secondary | ICD-10-CM

## 2018-03-19 LAB — COMPREHENSIVE METABOLIC PANEL
ALBUMIN: 3.4 g/dL — AB (ref 3.5–5.2)
ALK PHOS: 121 U/L — AB (ref 39–117)
ALT: 12 U/L (ref 0–35)
AST: 9 U/L (ref 0–37)
BILIRUBIN TOTAL: 0.2 mg/dL (ref 0.2–1.2)
BUN: 21 mg/dL (ref 6–23)
CALCIUM: 9.1 mg/dL (ref 8.4–10.5)
CO2: 28 meq/L (ref 19–32)
Chloride: 100 mEq/L (ref 96–112)
Creatinine, Ser: 1.11 mg/dL (ref 0.40–1.20)
GFR: 58.68 mL/min — AB (ref >60.00)
Glucose, Bld: 257 mg/dL — ABNORMAL HIGH (ref 70–99)
Potassium: 3.9 mEq/L (ref 3.5–5.1)
SODIUM: 137 meq/L (ref 135–145)
TOTAL PROTEIN: 6.7 g/dL (ref 6.0–8.3)

## 2018-03-19 LAB — CBC
HCT: 37.7 % (ref 36.0–46.0)
HEMOGLOBIN: 12.1 g/dL (ref 12.0–15.0)
MCHC: 32.1 g/dL (ref 30.0–36.0)
MCV: 75.1 fl — ABNORMAL LOW (ref 78.0–100.0)
Platelets: 306 10*3/uL (ref 150.0–400.0)
RBC: 5.02 Mil/uL (ref 3.87–5.11)
RDW: 15 % (ref 11.5–15.5)
WBC: 12.3 10*3/uL — AB (ref 4.0–10.5)

## 2018-03-19 LAB — LIPID PANEL
CHOLESTEROL: 190 mg/dL (ref 0–200)
HDL: 46 mg/dL (ref >39.00)
NonHDL: 144.27
TRIGLYCERIDES: 217 mg/dL — AB (ref 0.0–149.0)
Total CHOL/HDL Ratio: 4
VLDL: 43.4 mg/dL — ABNORMAL HIGH (ref 0.0–40.0)

## 2018-03-19 LAB — HEMOGLOBIN A1C: Hgb A1c MFr Bld: 14.1 % — ABNORMAL HIGH (ref 4.6–6.5)

## 2018-03-19 LAB — LDL CHOLESTEROL, DIRECT: Direct LDL: 98 mg/dL

## 2018-03-19 MED ORDER — FLUCONAZOLE 150 MG PO TABS: 150.0000 mg | ORAL_TABLET | ORAL | 0 refills | Status: AC

## 2018-03-19 NOTE — Assessment & Plan Note (Signed)
BP above goal initially but recheck is normal. Continue lasix, amlodipine, propranolol, metoprolol. Checking CMP as she does have CKD stage 3.

## 2018-03-19 NOTE — Assessment & Plan Note (Signed)
Rx for diflucan and given poorly controlled diabetes will do 3 tablets as 1 tablet may not be enough to clear symptoms. Advised to work on control of diabetes to help avoid recurrence of yeast infection.

## 2018-03-19 NOTE — Assessment & Plan Note (Signed)
Checking lipid panel and she is on pravastatin 40 mg daily. Adjust as needed.

## 2018-03-19 NOTE — Progress Notes (Signed)
   Subjective:   Patient ID: Donna Booker, female    DOB: 1945-03-28, 73 y.o.   MRN: [id number]  HPI The patient is a 73 YO female coming in for possible yeast infection (whiteish discharge with itching the last 2 weeks, denies fevers or chills, denies abdominal pain, overall worsening, has not tried anything for this) and uncontrolled diabetes (last HgA1c 13.1, she has been poorly controlled for some time, diagnosed in 2006, not checking blood sugar currently and has no working meter although has several meters at home which she cannot figure out, denies symptoms of low sugars, admits to taking her medications) and blood pressure (was rushing to get here, denies missing medications, denies headaches or chest pains).   Review of Systems  Constitutional: Negative.   HENT: Negative.   Eyes: Negative.   Respiratory: Negative for cough, chest tightness and shortness of breath.   Cardiovascular: Negative for chest pain, palpitations and leg swelling.  Gastrointestinal: Negative for abdominal distention, abdominal pain, constipation, diarrhea, nausea and vomiting.  Genitourinary: Positive for vaginal discharge. Negative for dysuria, enuresis, frequency, menstrual problem, pelvic pain and vaginal bleeding.  Musculoskeletal: Negative.   Skin: Negative.   Neurological: Negative.   Psychiatric/Behavioral: Negative.     Objective:  Physical Exam Constitutional:      Appearance: She is well-developed.  HENT:     Head: Normocephalic and atraumatic.  Neck:     Musculoskeletal: Normal range of motion.  Cardiovascular:     Rate and Rhythm: Normal rate and regular rhythm.  Pulmonary:     Effort: Pulmonary effort is normal. No respiratory distress.     Breath sounds: Normal breath sounds. No wheezing or rales.  Abdominal:     General: Bowel sounds are normal. There is no distension.     Palpations: Abdomen is soft.     Tenderness: There is no abdominal tenderness. There is no rebound.  Skin:   General: Skin is warm and dry.  Neurological:     Mental Status: She is alert and oriented to person, place, and time.     Coordination: Coordination normal.     Vitals:   03/19/18 0819  BP: (!) 160/88  Pulse: 94  Temp: 98.4 F (36.9 C)  TempSrc: Oral  SpO2: 97%  Weight: 218 lb (98.9 kg)  Height: 5\' 3"  (1.6 m)    Assessment & Plan:

## 2018-03-19 NOTE — Patient Instructions (Signed)
We will check the blood work today.   We have sent in diflucan to take 1 pill today, then 1 pill Friday if still having symptoms. Then take 1 pill Tuesday if still having symptoms.

## 2018-03-19 NOTE — Assessment & Plan Note (Signed)
Last HgA1c 13.1 and not monitoring sugars. Talked about the importance of that and likely yeast infection due to high sugars. Advised she can bring meter to our office for help in working this. Seeing endo and checking HgA1c, foot exam done today. Advised to keep close follow up with them for adjustment of regimen if needed.

## 2018-03-20 ENCOUNTER — Encounter: Payer: Self-pay | Admitting: Internal Medicine

## 2018-03-20 NOTE — Progress Notes (Signed)
Abstracted and sent to scan  

## 2018-03-28 DIAGNOSIS — M0579 Rheumatoid arthritis with rheumatoid factor of multiple sites without organ or systems involvement: Secondary | ICD-10-CM | POA: Diagnosis not present

## 2018-04-01 ENCOUNTER — Ambulatory Visit: Payer: BLUE CROSS/BLUE SHIELD | Admitting: Internal Medicine

## 2018-04-07 ENCOUNTER — Other Ambulatory Visit: Payer: Self-pay | Admitting: Cardiology

## 2018-04-07 DIAGNOSIS — I10 Essential (primary) hypertension: Secondary | ICD-10-CM

## 2018-04-09 ENCOUNTER — Other Ambulatory Visit: Payer: Self-pay

## 2018-04-09 DIAGNOSIS — I10 Essential (primary) hypertension: Secondary | ICD-10-CM

## 2018-04-09 MED ORDER — PRAVASTATIN SODIUM 40 MG PO TABS: 40.0000 mg | ORAL_TABLET | Freq: Every day | ORAL | 1 refills | Status: DC

## 2018-04-09 MED ORDER — METOPROLOL SUCCINATE ER 50 MG PO TB24: 50.0000 mg | ORAL_TABLET | Freq: Every day | ORAL | 1 refills | Status: DC

## 2018-04-18 DIAGNOSIS — M0579 Rheumatoid arthritis with rheumatoid factor of multiple sites without organ or systems involvement: Secondary | ICD-10-CM | POA: Diagnosis not present

## 2018-04-18 DIAGNOSIS — R079 Chest pain, unspecified: Secondary | ICD-10-CM | POA: Diagnosis not present

## 2018-04-18 DIAGNOSIS — R700 Elevated erythrocyte sedimentation rate: Secondary | ICD-10-CM | POA: Diagnosis not present

## 2018-04-18 DIAGNOSIS — M130 Polyarthritis, unspecified: Secondary | ICD-10-CM | POA: Diagnosis not present

## 2018-04-22 ENCOUNTER — Telehealth: Payer: Self-pay | Admitting: Internal Medicine

## 2018-04-22 NOTE — Telephone Encounter (Signed)
Noted thanks °

## 2018-04-22 NOTE — Telephone Encounter (Signed)
Patient called stating that she has had a vaginal odor since the last time she has seen Dr. Okey Dupre.  Patient is going to try to get scheduled with an OBGYN. However she would like to know if she can come in to see Dr. Okey Dupre in the meantime or what Dr. Okey Dupre suggest?

## 2018-04-22 NOTE — Telephone Encounter (Signed)
This was like a month ago patient can do a virtual with Dr. Okey Dupre if she would like or wait to get in with OBGYN

## 2018-04-22 NOTE — Telephone Encounter (Signed)
DOXY visit scheduled for tomorrow.

## 2018-04-23 ENCOUNTER — Ambulatory Visit: Payer: BLUE CROSS/BLUE SHIELD | Admitting: Internal Medicine

## 2018-04-23 NOTE — Progress Notes (Signed)
Virtual Visit via Video Note  I connected with Donna Booker on 04/23/18 at 10:40 AM EDT by a video enabled telemedicine application however she did not answer. We did contact her by phone and she was unable to connect to the video chat so we had to reschedule her visit.   Myrlene Broker, MD

## 2018-04-24 ENCOUNTER — Ambulatory Visit: Payer: BLUE CROSS/BLUE SHIELD | Admitting: Internal Medicine

## 2018-04-24 NOTE — Progress Notes (Signed)
Virtual Visit via Video Note  I connected with Drina Goldsmith Olver on 04/24/18 at 10:40 AM EDT by a video enabled telemedicine application and she was unable to get this to work.   Myrlene Broker, MD

## 2018-04-25 ENCOUNTER — Encounter: Payer: Self-pay | Admitting: Internal Medicine

## 2018-04-25 ENCOUNTER — Ambulatory Visit (INDEPENDENT_AMBULATORY_CARE_PROVIDER_SITE_OTHER): Payer: BLUE CROSS/BLUE SHIELD | Admitting: Internal Medicine

## 2018-04-25 DIAGNOSIS — E1165 Type 2 diabetes mellitus with hyperglycemia: Secondary | ICD-10-CM

## 2018-04-25 DIAGNOSIS — B379 Candidiasis, unspecified: Secondary | ICD-10-CM | POA: Diagnosis not present

## 2018-04-25 DIAGNOSIS — Z794 Long term (current) use of insulin: Secondary | ICD-10-CM | POA: Diagnosis not present

## 2018-04-25 DIAGNOSIS — M0579 Rheumatoid arthritis with rheumatoid factor of multiple sites without organ or systems involvement: Secondary | ICD-10-CM | POA: Diagnosis not present

## 2018-04-25 LAB — HEPATIC FUNCTION PANEL
ALT: 13 (ref 7–35)
AST: 12 — AB (ref 13–35)
Alkaline Phosphatase: 124 (ref 25–125)
Bilirubin, Total: 0.2

## 2018-04-25 LAB — CBC AND DIFFERENTIAL
HCT: 38 (ref 36–46)
Hemoglobin: 12 (ref 12.0–16.0)
Neutrophils Absolute: 5
Platelets: 341 (ref 150–399)
WBC: 11.2

## 2018-04-25 LAB — BASIC METABOLIC PANEL
BUN: 18 (ref 4–21)
Creatinine: 1.1 (ref 0.5–1.1)
Glucose: 269
Potassium: 4.1 (ref 3.4–5.3)
Sodium: 142 (ref 137–147)

## 2018-04-25 NOTE — Progress Notes (Signed)
Virtual Visit via Video Note  I connected with Donna Booker on 04/25/18 at  3:40 PM EDT by a video enabled telemedicine application however video application failed and visit was conducted with audio only and verified that I am speaking with the correct person using two identifiers.   I discussed the limitations of evaluation and management by telemedicine and the availability of in person appointments. The patient expressed understanding and agreed to proceed.  History of Present Illness: The patient is a 73 y.o. female with visit for vaginal odor. Started at least a month ago. Was seen for vaginal discharge and burning and treated for yeast infection at that time. This helped with her burning and discharge but she feels that there is an odor. She has called her gyn and they are going to get back with her on when to come in. She has poorly controlled diabetes and she has not checked her sugars at all since our last visit. She admits to having several glucometers at her house but cannot work any of them. Denies extreme thirst or urination. Denies fevers or chills. No symptoms that make her think she has low sugar. Has no vaginal discharge or pain or burning. Denies significant change in symptoms recently. Overall it is stable. Has tried diflucan.  Observations/Objective: Appearance: not visualized, breathing appears normal and voice strong, memory with some problems staying on track, mental status is A and O times 3  Assessment and Plan: See problem oriented charting  Follow Up Instructions: She will see gyn for the odor and we discussed the importance of management of her diabetes to help with her vaginal health, advised on methods to get her glucometers working and when to check and call back next week with readings so we can adjust insulin dosing potentially.   During course of our visit she is asking about FMLA papers but cannot describe why she needs FMLA other than infusions and medical  appointments and feels she needs 2 apt per month.  I discussed the assessment and treatment plan with the patient. The patient was provided an opportunity to ask questions and all were answered. The patient agreed with the plan and demonstrated an understanding of the instructions.   The patient was advised to call back or seek an in-person evaluation if the symptoms worsen or if the condition fails to improve as anticipated.  Myrlene Broker, MD   Visit time 25 minutes: greater than 50% of that time was spent in face to face counseling and coordination of care with the patient: counseled about how poor management of diabetes can cause vaginal health problems as well as yeast problems which are difficult or impossible to eliminate without management and lowering of the sugars.

## 2018-04-25 NOTE — Assessment & Plan Note (Signed)
Suspect high sugars and poorly managed diabetes may be the root cause of her vaginal problems. Without testing I would not recommend treatment for odor. She sounds to have recovered well from yeast infection 1 month ago. She would like to see gyn for vaginal exam and testing. She is asked to start monitoring sugars and call back next week with readings so we can adjust her insulin. She is seeing endocrinology but not following up appropriately with them. I am unable to fill out FMLA for specific medical condition but can excuse 2 half days per month for doctor visits.

## 2018-04-25 NOTE — Assessment & Plan Note (Signed)
This is recovered well. She would like to see gyn for exam and testing for the odor. There is no pain or discharge to suggest abnormal findings which require immediate treatment.

## 2018-04-26 LAB — HEMOGLOBIN A1C: Hemoglobin A1C: 13

## 2018-04-27 ENCOUNTER — Other Ambulatory Visit: Payer: Self-pay | Admitting: Internal Medicine

## 2018-05-02 ENCOUNTER — Ambulatory Visit (INDEPENDENT_AMBULATORY_CARE_PROVIDER_SITE_OTHER): Payer: BLUE CROSS/BLUE SHIELD | Admitting: Internal Medicine

## 2018-05-02 ENCOUNTER — Encounter: Payer: Self-pay | Admitting: Internal Medicine

## 2018-05-02 ENCOUNTER — Other Ambulatory Visit: Payer: Self-pay

## 2018-05-02 DIAGNOSIS — N183 Chronic kidney disease, stage 3 (moderate): Secondary | ICD-10-CM | POA: Diagnosis not present

## 2018-05-02 DIAGNOSIS — Z794 Long term (current) use of insulin: Secondary | ICD-10-CM | POA: Diagnosis not present

## 2018-05-02 DIAGNOSIS — E1142 Type 2 diabetes mellitus with diabetic polyneuropathy: Secondary | ICD-10-CM | POA: Diagnosis not present

## 2018-05-02 DIAGNOSIS — E0500 Thyrotoxicosis with diffuse goiter without thyrotoxic crisis or storm: Secondary | ICD-10-CM

## 2018-05-02 DIAGNOSIS — E1122 Type 2 diabetes mellitus with diabetic chronic kidney disease: Secondary | ICD-10-CM

## 2018-05-02 DIAGNOSIS — E1165 Type 2 diabetes mellitus with hyperglycemia: Secondary | ICD-10-CM

## 2018-05-02 DIAGNOSIS — E785 Hyperlipidemia, unspecified: Secondary | ICD-10-CM

## 2018-05-02 DIAGNOSIS — E1169 Type 2 diabetes mellitus with other specified complication: Secondary | ICD-10-CM

## 2018-05-02 DIAGNOSIS — IMO0002 Reserved for concepts with insufficient information to code with codable children: Secondary | ICD-10-CM

## 2018-05-02 MED ORDER — INSULIN DETEMIR 100 UNIT/ML FLEXPEN: 50.0000 [IU] | PEN_INJECTOR | Freq: Every day | SUBCUTANEOUS | 5 refills | Status: DC

## 2018-05-02 NOTE — Progress Notes (Addendum)
Patient ID: Donna Booker, female   DOB: 10/02/1945, 73 y.o.   MRN: [id number]  Patient location: Home My location: Office  Referring Provider: Myrlene Brokerrawford, Elizabeth A, MD  I connected with the patient on 05/02/18 at  1:58 PM EDT by telephone and verified that I am speaking with the correct person.   I discussed the limitations of evaluation and management by telephone and the availability of in person appointments. The patient expressed understanding and agreed to proceed.   Details of the encounter are shown below.  HPI: Donna LazarJennett R Mcquary is a 73 y.o.-year-old female, presenting for f/u for DM2, dx in 1987, insulin-dependent since 2006, uncontrolled, with complications (PN, stage 2-3 CKD)and Graves ds. Last visit 4 months ago.  Graves ds  - diagnosed during admission for A. fib with RVR in 05/2017  Review TFTs: Lab Results  Component Value Date   TSH 10.31 (H) 12/04/2017   TSH <0.010 (L) 07/14/2017   TSH <0.01 (L) 07/06/2017   TSH <0.01 Repeated and verified X2. (L) 05/10/2017   TSH <0.010 (L) 04/16/2017   Lab Results  Component Value Date   FREET4 0.57 (L) 12/04/2017   FREET4 0.71 (L) 07/15/2017   FREET4 0.72 07/06/2017   FREET4 1.15 05/10/2017   FREET4 5.15 (H) 04/16/2017   Lab Results  Component Value Date   T3FREE 2.8 12/04/2017   T3FREE 2.1 07/15/2017   T3FREE 4.2 05/10/2017   T3FREE 18.5 (H) 04/16/2017   Graves AB's were elevated: Lab Results  Component Value Date   TSI 8.02 (H) 04/16/2017   She was started on methimazole 10 mg 2x a day and atenolol 50 mg 3x a day.Her TFTs were significantly improved >> we decreased the methimazole to 5 mg 2X a day, but then on 5 mg daily (? When changed - by PCP? -Patient cannot remember exactly when she started her prescription for methimazole only mentioned to take it once a day...).  At last visit, I advised her to stop methimazole completely as her TSH was more than 10.  Atenolol was then switched to propranolol 20 mg 3 times  a day, now taking it 1x a day.  Pt denies: - feeling nodules in neck - hoarseness - dysphagia - choking - SOB with lying down  No FH of thyroid disease. No FH of thyroid cancer. No h/o radiation tx to head or neck.  No herbal supplements. No Biotin use. No recent steroids use.   DM2:  Last hemoglobin A1c was: 04/25/2018: HbA1c 13% Lab Results  Component Value Date   HGBA1C 14.1 (H) 03/19/2018   HGBA1C 13.1 (A) 12/28/2017   HGBA1C 9.8 (H) 07/14/2017   Pt wason a regimen of: - Metformin 1000 mg in am - VGo 30 - initially with 1-2 meal clicks, now off these - started 2015 She was on Lantus 30, but taken off to start the VGo. She was on Victoza. She tried Bydureon (02/2013-10/2014) >> made her hungry, did not lower sugars. She also tried Trulicity 1.5 mg (10/2013-04/2014) >> did not lower her sugars.  At last visit she was on: - Levemir 25 >> 35 units after dinner - Glipizide 5 >> 10 mg 2x a day before b'fast and dinner.  50 I advised her to change to: - Glipizide 10 mg 2x a day before meals - Levemir 40 units at night  - Trulicity 1.5 mg weekly - started 12/2018  At last visit, patient was not checking her sugars.  She still does not check: -  am: 99-138, 161 >> 94-128 >> ? - 2h after b'fast: n/c - before lunch: 221 (cantaloupe) >> n/c - 2h after lunch: n/c >> 200 >> n/c - before dinner: 119, 130-151 >> 120-125 >> ? - 2h after dinner: n/c >> 189 >> n/c - bedtime: 192, 265 >> 130-137 >> ? - nighttime: n/c Lowest sugar was 94 >> ?; she has hypoglycemia awareness in the 70s. Highest sugar was 298 >> ?  Glucometer: AccuChek  -+ CKD, last BUN/creatinine:  04/25/2018: Glucose 269, BUN/creatinine 18/1.05 Lab Results  Component Value Date   BUN 21 03/19/2018   CREATININE 1.11 03/19/2018  On olmesartan. -+ HL; last set of lipids: Lab Results  Component Value Date   CHOL 190 03/19/2018   HDL 46.00 03/19/2018   LDLCALC 55 09/01/2014   LDLDIRECT 98.0 03/19/2018   TRIG  217.0 (H) 03/19/2018   CHOLHDL 4 03/19/2018  On pravastatin. - last eye exam was in 04/2017: No DR -  she denies numbness and tingling in her feet.   We also addressed her hypokalemia before, however, this normalized: - Possibly previously from ACE inhibitor, potassium supplementation +/-beta-blocker - After an initial K of 7.5 in 07/2017 >> K levels normalized after stopping ACEI and K supplements Lab Results  Component Value Date   K 3.9 03/19/2018   K 4.5 11/28/2017   K 4.5 11/01/2017   K 4.3 11/01/2017   K 4.4 08/13/2017  Of note, a cosyntropin stimulation test was normal and also aldosterone and renin were normal while in the hospital.   She had AKI on admission then.  Her son died in June 2019 -he had severe heart failure.  ROS: Constitutional: no weight gain/no weight loss, no fatigue, no subjective hyperthermia, no subjective hypothermia Eyes: no blurry vision, no xerophthalmia ENT: no sore throat, no nodules palpated in neck, no dysphagia, no odynophagia, no hoarseness Cardiovascular: no CP/no SOB/no palpitations/no leg swelling Respiratory: no cough/no SOB/no wheezing Gastrointestinal: no N/no V/no D/no C/no acid reflux Musculoskeletal: no muscle aches/no joint aches Skin: no rashes, no hair loss Neurological: no tremors/no numbness/no tingling/no dizziness  I reviewed pt's medications, allergies, PMH, social hx, family hx, and changes were documented in the history of present illness. Otherwise, unchanged from my initial visit note.  Past Medical History:  Diagnosis Date  . Diabetes mellitus   . Graves disease   . Hypertension   . Hyperthyroidism    Past Surgical History:  Procedure Laterality Date  . BREAST SURGERY     Social History   Social History  . Marital Status: Married    Spouse Name: N/A  . Number of Children: 1   Occupational History  . 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 Smoking status: Never Smoker   . Smokeless tobacco:  Not on file  . Alcohol Use: No  . Drug Use: No   Current Outpatient Medications on File Prior to Visit  Medication Sig Dispense Refill  . amLODipine (NORVASC) 10 MG tablet TAKE 1 TABLET BY MOUTH EVERY DAY 90 tablet 2  . apixaban (ELIQUIS) 5 MG TABS tablet Take 1 tablet (5 mg total) by mouth 2 (two) times daily. 60 tablet 0  . BREO ELLIPTA 200-25 MCG/INH AEPB TAKE 1 PUFF BY MOUTH EVERY DAY (Patient taking differently: Inhale 1 puff into the lungs daily. ) 180 each 1  . diclofenac sodium (VOLTAREN) 1 % GEL Apply 2 g topically 4 (four) times daily as needed (pain).    . Dulaglutide (  TRULICITY) 1.5 MG/0.5ML SOPN Inject 1.5 mg into the skin once a week. 12 pen 3  . esomeprazole (NEXIUM) 40 MG capsule Take 40 mg by mouth daily before breakfast.    . furosemide (LASIX) 20 MG tablet TAKE 1 TABLET BY MOUTH EVERY DAY 90 tablet 1  . gabapentin (NEURONTIN) 300 MG capsule TAKE 1 CAPSULE BY MOUTH THREE TIMES A DAY 270 capsule 1  . glipiZIDE (GLUCOTROL) 5 MG tablet Take 2 tablets (10 mg total) by mouth 2 (two) times daily before a meal. 120 tablet 11  . LEVEMIR FLEXTOUCH 100 UNIT/ML Pen INJECT 35 UNITS INTO THE SKIN DAILY AT 10 PM (Patient taking differently: Inject 40 Units into the skin at bedtime. ) 15 mL 1  . methimazole (TAPAZOLE) 5 MG tablet TAKE 2 TABLETS BY MOUTH TWICE A DAY 360 tablet 1  . metoprolol succinate (TOPROL-XL) 50 MG 24 hr tablet Take 1 tablet (50 mg total) by mouth daily. Take with or immediately following a meal. 90 tablet 1  . ondansetron (ZOFRAN) 4 MG tablet Take 1 tablet (4 mg total) by mouth every 8 (eight) hours as needed for nausea or vomiting. 12 tablet 0  . pravastatin (PRAVACHOL) 40 MG tablet Take 1 tablet (40 mg total) by mouth daily at 6 PM. 90 tablet 1  . propranolol (INDERAL) 10 MG tablet Take 2 tablets (20 mg total) by mouth 3 (three) times daily. (Patient taking differently: Take 20 mg by mouth daily. ) 90 tablet 1  . senna-docusate (SENOKOT-S) 8.6-50 MG tablet Take 1  tablet by mouth 2 (two) times daily. 60 tablet 11  . baclofen (LIORESAL) 10 MG tablet TAKE 1 TABLET BY MOUTH THREE TIMES A DAY (Patient not taking: Reported on 04/25/2018) 60 tablet 0   No current facility-administered medications on file prior to visit.    Allergies  Allergen Reactions  . Tramadol Nausea And Vomiting    FH - see HPI  PE: There were no vitals taken for this visit. Wt Readings from Last 3 Encounters:  03/19/18 218 lb (98.9 kg)  12/28/17 216 lb (98 kg)  12/04/17 215 lb (97.5 kg)   Constitutional:  in NAD  The physical exam was not performed (virtual visit).  ASSESSMENT: 1. 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  Graves' disease  3.  HL  We previously also advised her hyperkalemia: - in 07/2017, she had a critical value of potassium of 7.5.  She was admitted with high potassium since then >> Lasix, potassium supplementation, and lisinopril were stopped. - A cosyntropin stimulation test performed in the hospital was normal, ruling out primary adrenal insufficiency as a cause for hyperkalemia - Aldosterone and renin levels were normal during her last hospitalization - On her medication list, I do not see any possible culprits,   Other than possibly propranolol - I suggested a referral to nephrology  if her potassium continues to remain elevated after stopping ACE inhibitor and potassium supplementation - Latest potassium levels were reviewed and they normalized   PLAN:  1. Patient with longstanding, uncontrolled, type 2 diabetes, on basal insulin, glipizide, and GLP-1 receptor agonist added at last visit.  She was previously on Victoza which was stopped earlier last year.  Unfortunately, she is not checking sugars usually and I suspect that she is not compliant with her regimen.  At last visit, we increased Levemir and started Trulicity. Most recent HbA1c was 14.1% (abysmal) 1 month ago. -Unfortunately, she is still not  checking sugars as she does not know how to use her meter.  I directed her to the pharmacy should be able to help her.  She absolutely needs to start checking sugars.  For now, since HbA1c was so high, will increase Levemir to 50 units daily.  Further changes we can only do after she start checking sugars.  I advised her to send these to me in 1 to 2 weeks. - I suggested to:   Patient Instructions  Please continue: - Propranolol 20 mg 1x a day  Also, continue: - Glipizide 10 mg 2x a day before meals - Trulicity 1.5 mg weekly.   Please increase: - Levemir 50 units at night   Start checking sugars 1-2 times a day - send them to me in 1-2 weeks.  Come back for a follow up appt in 3 months.  - continue checking sugars at different times of the day - check 1-2x a day, rotating checks - advised for yearly eye exams >> she is UTD - Return to clinic in 3 mo with sugar log    2.  Graves' disease -Hyperthyroidism was diagnosed after she was admitted with A. fib with RVR and an acute pulmonary edema in 04/2017. -This is most likely Graves' disease due to her elevated TSI antibodies -She initially had symptoms of weight loss, diarrhea, but no tremors, palpitations, anxiety, heat intolerance. -Before last visit, she had >15 pounds weight gain -At last visit, patient was on methimazole 5 mg a day, but a recent TSH was above 10 so we stopped methimazole completely. -Unfortunately, she did not come back for repeat TFTs in 1.5 months after stopping methimazole... -We will recheck her TFTs as soon as safe to return to the clinic (coronavirus pandemic)  3. HL - Reviewed latest lipid panel from last month: LDL at goal but higher than before, triglycerides high Lab Results  Component Value Date   CHOL 190 03/19/2018   HDL 46.00 03/19/2018   LDLCALC 55 09/01/2014   LDLDIRECT 98.0 03/19/2018   TRIG 217.0 (H) 03/19/2018   CHOLHDL 4 03/19/2018  - Continues pravastatin without side effects.  - time  spent with the patient: 13 min, of which >50% was spent in obtaining information about her symptoms, reviewing her previous labs, evaluations, and treatments, counseling her about her conditions (please see the discussed topics above), and developing a plan to further investigate and treat them.   Carlus Pavlov, MD PhD [facility]

## 2018-05-02 NOTE — Patient Instructions (Addendum)
Please continue: - Propranolol 20 mg 1x a day  Also, continue: - Glipizide 10 mg 2x a day before meals - Trulicity 1.5 mg weekly.   Please increase: - Levemir 50 units at night   Start checking sugars 1-2 times a day - send them to me in 1-2 weeks.  Come back for a follow up appt in 3 months.

## 2018-05-08 ENCOUNTER — Encounter: Payer: Self-pay | Admitting: Internal Medicine

## 2018-05-10 ENCOUNTER — Telehealth: Payer: Self-pay | Admitting: Internal Medicine

## 2018-05-10 NOTE — Telephone Encounter (Signed)
Informed patient we did not receive forms. Give her the [phone] fax to try again.

## 2018-05-10 NOTE — Telephone Encounter (Signed)
Copied from CRM [id number]. Topic: General - Inquiry >> May 10, 2018 10:26 AM Maia Petties wrote: Reason for CRM: Pt states FMLA paperwork was sent in 2-3 weeks ago and her company is going to close her case if they are not returned. Pt states this would have been from [employer]. Pt said the deadline is today 05/10/2018. Pt requesting call back as they informed her they did not receive her paperwork.  Call back # [phone]

## 2018-05-10 NOTE — Telephone Encounter (Signed)
I do not have any paper work for her and neither does Dr. Okey Dupre

## 2018-05-14 ENCOUNTER — Encounter: Payer: Self-pay | Admitting: Internal Medicine

## 2018-05-14 NOTE — Progress Notes (Signed)
Abstracted and sent to scan  

## 2018-05-14 NOTE — Telephone Encounter (Signed)
Patient calling and states that she had the FMLA paperwork faxed to the office again on Friday 05/10/2018. Would like to know if the office received the paperwork? If not, she can bring a copy by the office and drop them off. Please advise.

## 2018-05-14 NOTE — Telephone Encounter (Signed)
Called patient and informed that I do not believe we have received this. Advised her that she may need to bring it to the office. She said that she would drop it off tomorrow.

## 2018-05-16 DIAGNOSIS — Z0279 Encounter for issue of other medical certificate: Secondary | ICD-10-CM

## 2018-05-16 NOTE — Telephone Encounter (Signed)
Appointment has been made for 5/15.

## 2018-05-16 NOTE — Telephone Encounter (Signed)
Is there a reason she is needing time off work (certain health problem or condition)? Also does she have certain people she is seeing every month to leave work early 2-4 times? May need visit to discuss depending.

## 2018-05-16 NOTE — Telephone Encounter (Signed)
Patient is requesting 2 to 3 days a month off,  and also 2-4 days a month to leave early for appointments.  Please advise if you approve.

## 2018-05-16 NOTE — Telephone Encounter (Signed)
Would need visit to discuss as it has been some time since last well visit, can be virtual

## 2018-05-16 NOTE — Telephone Encounter (Signed)
Patient states due to some of the following health problems :Congestive heart failure, HTN, Rheumatoid arthritis, Diabetes. She sees different doctors for all of these and needs to be able to get off work or leave earlier to go to these appointments. One the these specialists use to fill out the forms, but she was told her PCP needs too.

## 2018-05-17 ENCOUNTER — Ambulatory Visit (INDEPENDENT_AMBULATORY_CARE_PROVIDER_SITE_OTHER): Payer: BLUE CROSS/BLUE SHIELD | Admitting: Internal Medicine

## 2018-05-17 ENCOUNTER — Encounter: Payer: Self-pay | Admitting: Internal Medicine

## 2018-05-17 ENCOUNTER — Telehealth: Payer: Self-pay

## 2018-05-17 DIAGNOSIS — M10372 Gout due to renal impairment, left ankle and foot: Secondary | ICD-10-CM

## 2018-05-17 DIAGNOSIS — M059 Rheumatoid arthritis with rheumatoid factor, unspecified: Secondary | ICD-10-CM

## 2018-05-17 MED ORDER — COLCHICINE 0.6 MG PO TABS: 0.6000 mg | ORAL_TABLET | Freq: Every day | ORAL | 0 refills | Status: DC | PRN

## 2018-05-17 NOTE — Telephone Encounter (Signed)
Received fax from [facility] for a refill of Methimazole.  Per last chart note:  At last visit, patient was on methimazole 5 mg a day, but a recent TSH was above 10 so we stopped methimazole completely. -Unfortunately, she did not come back for repeat TFTs in 1.5 months after stopping methimazole... -We will recheck her TFTs as soon as safe to return to the clinic (coronavirus pandemic)

## 2018-05-17 NOTE — Telephone Encounter (Signed)
Yes, she should be off the medication.  We will need labs as soon as safe.

## 2018-05-17 NOTE — Assessment & Plan Note (Signed)
Still getting infusions and will sign FMLA papers for 3 days per month and 2-4 doctor visits per month.

## 2018-05-17 NOTE — Progress Notes (Signed)
Virtual Visit via Video Note  I connected with Donna Booker on 05/17/18 at  4:00 PM EDT by a video enabled telemedicine application and verified that I am speaking with the correct person using two identifiers.  The patient and the provider were at separate locations throughout the entire encounter.   I discussed the limitations of evaluation and management by telemedicine and the availability of in person appointments. The patient expressed understanding and agreed to proceed.  History of Present Illness: The patient is a 73 y.o. female with visit for follow up RA and gout. She is having a gout flare currently. She does monthly infusions for her RA per rheumatology and they check her labs etc. She is doing well from that and is satisfied with control. Needs FMLA papers filled out to miss time from work for those visits and infusions. Started years ago. Has gout flare left great toe for last 2 days. Severe 10/10 pain. Denies fevers or chills. Overall it is worsening. Has tried monthly infusion for RA.  Observations/Objective: Appearance: normal, breathing appears normal, casual grooming, abdomen does not appear distended, throat normal, memory normal, mental status is A and O times 3, EOM intact bilateral  Assessment and Plan: See problem oriented charting  Follow Up Instructions: rx colchicine (cannot do prednisone first line given poorly controlled diabetes), will fill out FMLA  I discussed the assessment and treatment plan with the patient. The patient was provided an opportunity to ask questions and all were answered. The patient agreed with the plan and demonstrated an understanding of the instructions.   The patient was advised to call back or seek an in-person evaluation if the symptoms worsen or if the condition fails to improve as anticipated.  Myrlene Broker, MD

## 2018-05-17 NOTE — Assessment & Plan Note (Signed)
Rx for colchicine. Prednisone is not a good option given poorly controlled diabetes.

## 2018-05-20 ENCOUNTER — Other Ambulatory Visit: Payer: Self-pay

## 2018-05-20 MED ORDER — APIXABAN 5 MG PO TABS: 5.0000 mg | ORAL_TABLET | Freq: Two times a day (BID) | ORAL | 3 refills | Status: DC

## 2018-05-20 NOTE — Telephone Encounter (Signed)
Forms have been completed&signed, Faxed to [place]@ [phone], Copy sent to scan &charged for.   Original mailed to patient for her records.

## 2018-05-21 NOTE — Telephone Encounter (Signed)
Noted  

## 2018-05-23 ENCOUNTER — Telehealth: Payer: Self-pay | Admitting: Internal Medicine

## 2018-05-23 NOTE — Telephone Encounter (Signed)
MEDICATION: Insulin Detemir (LEVEMIR FLEXTOUCH) 100 UNIT/ML Pen  PHARMACY:  [facility]  IS THIS A 90 DAY SUPPLY :   IS PATIENT OUT OF MEDICATION:   IF NOT; HOW MUCH IS LEFT: 1 left  LAST APPOINTMENT DATE: @5 /15/2020  NEXT APPOINTMENT DATE:@Visit  date not found  DO WE HAVE YOUR PERMISSION TO LEAVE A DETAILED MESSAGE:  OTHER COMMENTS:    **Let patient know to contact pharmacy at the end of the day to make sure medication is ready. **  ** Please notify patient to allow 48-72 hours to process**  **Encourage patient to contact the pharmacy for refills or they can request refills through [facility]**

## 2018-05-24 MED ORDER — INSULIN DETEMIR 100 UNIT/ML FLEXPEN: 50.0000 [IU] | PEN_INJECTOR | Freq: Every day | SUBCUTANEOUS | 5 refills | Status: DC

## 2018-05-24 NOTE — Addendum Note (Signed)
Addended by: Darliss Ridgel I on: 05/24/2018 02:01 PM   Modules accepted: Orders

## 2018-05-24 NOTE — Telephone Encounter (Signed)
RX sent

## 2018-06-08 ENCOUNTER — Other Ambulatory Visit: Payer: Self-pay | Admitting: Internal Medicine

## 2018-06-19 NOTE — Telephone Encounter (Signed)
Pt calling to say this medication doesn't seem to be working and wants to know if there is anything else she can have for the gout in her big toe.

## 2018-06-19 NOTE — Telephone Encounter (Signed)
She needs to have visit with sports medicine or rheumatologist to evaluate her foot if she is still having gout there for a possible injection. She cannot take oral prednisone because her diabetes is very poorly controlled.

## 2018-06-20 NOTE — Telephone Encounter (Signed)
Can you please make an appointment with sports medicine to assess her foot. Thank you

## 2018-06-20 NOTE — Telephone Encounter (Signed)
Can patient be worked in °

## 2018-07-02 ENCOUNTER — Other Ambulatory Visit: Payer: Self-pay | Admitting: Internal Medicine

## 2018-07-12 ENCOUNTER — Other Ambulatory Visit: Payer: Self-pay | Admitting: Internal Medicine

## 2018-07-18 ENCOUNTER — Other Ambulatory Visit: Payer: Self-pay

## 2018-07-18 ENCOUNTER — Ambulatory Visit: Payer: BC Managed Care – PPO | Admitting: Cardiology

## 2018-07-18 ENCOUNTER — Encounter: Payer: Self-pay | Admitting: Cardiology

## 2018-07-18 VITALS — BP 162/86 | HR 85 | Ht 64.0 in | Wt 220.0 lb

## 2018-07-18 DIAGNOSIS — Z8639 Personal history of other endocrine, nutritional and metabolic disease: Secondary | ICD-10-CM | POA: Diagnosis not present

## 2018-07-18 DIAGNOSIS — E118 Type 2 diabetes mellitus with unspecified complications: Secondary | ICD-10-CM | POA: Diagnosis not present

## 2018-07-18 DIAGNOSIS — I480 Paroxysmal atrial fibrillation: Secondary | ICD-10-CM | POA: Diagnosis not present

## 2018-07-18 DIAGNOSIS — I10 Essential (primary) hypertension: Secondary | ICD-10-CM

## 2018-07-18 MED ORDER — METOPROLOL SUCCINATE ER 100 MG PO TB24: 100.0000 mg | ORAL_TABLET | Freq: Every day | ORAL | 3 refills | Status: DC

## 2018-07-18 NOTE — Progress Notes (Signed)
Follow up visit  Subjective:   Donna Booker, female    DOB: 09-15-1945, 73 y.o.   MRN: [id number]   Chief Complaint  Patient presents with  . Atrial Fibrillation  . Congestive Heart Failure    HPI  73 year old African American female withf Graves' disease with hyperthyroidism, paroxysmal atrial fibrillation, mild PH, HFpEF (secondary to Afib), uncontrolled type II diabetes mellitus with diabetic polyneuropathy, CKD stage III  Patient is working with her rheumatologist on her rheumatoid arthritis. She denies chest pain, shortness of breath, palpitations, leg edema, orthopnea, PND, TIA/syncope. Blood pressure is elevated.    Past Medical History:  Diagnosis Date  . Diabetes mellitus   . Graves disease   . Hypertension   . Hyperthyroidism      Past Surgical History:  Procedure Laterality Date  . BREAST SURGERY       Social History   Socioeconomic History  . Marital status: Married    Spouse name: Not on file  . Number of children: Not on file  . Years of education: Not on file  . Highest education level: Not on file  Occupational History  . Not on file  Social Needs  . Financial resource strain: Not on file  . Food insecurity    Worry: Not on file    Inability: Not on file  . Transportation needs    Medical: Not on file    Non-medical: Not on file  Tobacco Use  . Smoking status: Never Smoker  . Smokeless tobacco: Never Used  Substance and Sexual Activity  . Alcohol use: No  . Drug use: No  . Sexual activity: Not on file  Lifestyle  . Physical activity    Days per week: Not on file    Minutes per session: Not on file  . Stress: Not on file  Relationships  . Social Herbalist on phone: Not on file    Gets together: Not on file    Attends religious service: Not on file    Active member of club or organization: Not on file    Attends meetings of clubs or organizations: Not on file    Relationship status: Not on file  . Intimate partner  violence    Fear of current or ex partner: Not on file    Emotionally abused: Not on file    Physically abused: Not on file    Forced sexual activity: Not on file  Other Topics Concern  . Not on file  Social History Narrative  . Not on file     Family History  Problem Relation Age of Onset  . Diabetes Mellitus II Mother   . Diabetes Mellitus II Father   . Hypertension Father   . Hyperlipidemia Father      Current Outpatient Medications on File Prior to Visit  Medication Sig Dispense Refill  . amLODipine (NORVASC) 10 MG tablet TAKE 1 TABLET BY MOUTH EVERY DAY 90 tablet 2  . apixaban (ELIQUIS) 5 MG TABS tablet Take 1 tablet (5 mg total) by mouth 2 (two) times daily. 180 tablet 3  . baclofen (LIORESAL) 10 MG tablet TAKE 1 TABLET BY MOUTH THREE TIMES A DAY (Patient not taking: Reported on 04/25/2018) 60 tablet 0  . BREO ELLIPTA 200-25 MCG/INH AEPB TAKE 1 PUFF BY MOUTH EVERY DAY 180 each 1  . colchicine 0.6 MG tablet TAKE 1 TABLET (0.6 MG TOTAL) BY MOUTH DAILY AS NEEDED (PAIN). 30 tablet 0  . diclofenac sodium (  VOLTAREN) 1 % GEL Apply 2 g topically 4 (four) times daily as needed (pain).    . Dulaglutide (TRULICITY) 1.5 EH/6.3JS SOPN Inject 1.5 mg into the skin once a week. 12 pen 3  . esomeprazole (NEXIUM) 40 MG capsule Take 40 mg by mouth daily before breakfast.    . furosemide (LASIX) 20 MG tablet TAKE 1 TABLET BY MOUTH EVERY DAY 90 tablet 1  . gabapentin (NEURONTIN) 300 MG capsule TAKE 1 CAPSULE BY MOUTH THREE TIMES A DAY 270 capsule 1  . glipiZIDE (GLUCOTROL) 5 MG tablet Take 2 tablets (10 mg total) by mouth 2 (two) times daily before a meal. 120 tablet 11  . Insulin Detemir (LEVEMIR FLEXTOUCH) 100 UNIT/ML Pen Inject 50 Units into the skin at bedtime. 10 pen 5  . metoprolol succinate (TOPROL-XL) 50 MG 24 hr tablet Take 1 tablet (50 mg total) by mouth daily. Take with or immediately following a meal. 90 tablet 1  . ondansetron (ZOFRAN) 4 MG tablet Take 1 tablet (4 mg total) by mouth  every 8 (eight) hours as needed for nausea or vomiting. 12 tablet 0  . pravastatin (PRAVACHOL) 40 MG tablet Take 1 tablet (40 mg total) by mouth daily at 6 PM. 90 tablet 1  . propranolol (INDERAL) 10 MG tablet Take 2 tablets (20 mg total) by mouth 3 (three) times daily. (Patient taking differently: Take 20 mg by mouth daily. ) 90 tablet 1  . senna-docusate (SENOKOT-S) 8.6-50 MG tablet Take 1 tablet by mouth 2 (two) times daily. 60 tablet 11   No current facility-administered medications on file prior to visit.     Cardiovascular studies:    Echocardiogram 07/26/2017: Left ventricle cavity is normal in size. Moderate concentric hypertrophy of the left ventricle. Normal global wall motion. Doppler evidence of grade I (impaired) diastolic dysfunction, elevated LAP. Calculated EF 64%. Left atrial cavity is moderately dilated at 4.7 cm. Mild calcification of the aortic valve annulus. Trileaflet aortic valve with no regurgitation noted. Mild tricuspid regurgitation. Mild pulmonary hypertension. IVC is normal with respiratory variation. Compared to the hospital echocardiogram 04/16/2017, moderate RA enlargement not present  and moderate pulmonary hypertension is now mild.  Renal artery duplex ([facility]) 05/23/2017: No renal artery stenosis  Recent labs: 10/05/2017: Glucose 258, BUN/Cr 28/1.23 eGFR 45/51. Na/K 139/4.9   Review of Systems  Constitution: Negative for decreased appetite, malaise/fatigue, weight gain and weight loss.  HENT: Negative for congestion.   Eyes: Negative for visual disturbance.  Cardiovascular: Negative for chest pain, dyspnea on exertion, leg swelling, palpitations and syncope.  Respiratory: Negative for cough.   Endocrine: Negative for cold intolerance.  Hematologic/Lymphatic: Does not bruise/bleed easily.  Skin: Negative for itching and rash.  Musculoskeletal: Negative for myalgias.  Gastrointestinal: Negative for abdominal pain, nausea and vomiting.   Genitourinary: Negative for dysuria.  Neurological: Negative for dizziness and weakness.  Psychiatric/Behavioral: The patient is not nervous/anxious.   All other systems reviewed and are negative.        Vitals:   07/18/18 1129 07/18/18 1308  BP: (!) 194/108 (!) 162/86  Pulse:  85  SpO2:  99%    Body mass index is 37.76 kg/m. Filed Weights   07/18/18 1127 07/18/18 1308  Weight: 223 lb (101.2 kg) 220 lb (99.8 kg)     Objective:   Physical Exam  Constitutional: She is oriented to person, place, and time. She appears well-developed and well-nourished. No distress.  HENT:  Head: Normocephalic and atraumatic.  Eyes: Pupils are equal, round,  and reactive to light. Conjunctivae are normal.  Neck: No JVD present.  Cardiovascular: Normal rate, regular rhythm and intact distal pulses.  Pulmonary/Chest: Effort normal and breath sounds normal. She has no wheezes. She has no rales.  Abdominal: Soft. Bowel sounds are normal. There is no rebound.  Musculoskeletal:        General: No edema.  Lymphadenopathy:    She has no cervical adenopathy.  Neurological: She is alert and oriented to person, place, and time. No cranial nerve deficit.  Skin: Skin is warm and dry.  Psychiatric: She has a normal mood and affect.  Nursing note and vitals reviewed.         Assessment & Recommendations:   73 year old African American female withf Graves' disease with hyperthyroidism, paroxysmal atrial fibrillation, mild PH, HFpEF (secondary to Afib), uncontrolled type II diabetes mellitus with diabetic polyneuropathy, CKD stage III  PAF: Currently in sinus rhythm. CHA2DS2VASc score 4, annual stroke risk 5%. Continue anticaogualtion with eliquis 5 mg bid.  Hypertension: Suboptimal control. Increase metoprolol succinate to 100 mg daily. Continue amodipine,  She should not be on ACEi/ARB/aldosterone antagonist due to pevious episodes of hyperkalemia.  Grave's disease, DM:  Managed by Dr.  Cruzita Lederer    F/u in 4 wks.   Nigel Mormon, MD [facility] [place] Pager: [phone] Office: [phone] If no answer Cell [phone]

## 2018-07-20 ENCOUNTER — Other Ambulatory Visit: Payer: Self-pay | Admitting: Internal Medicine

## 2018-07-25 ENCOUNTER — Other Ambulatory Visit: Payer: Self-pay | Admitting: Internal Medicine

## 2018-07-25 NOTE — Telephone Encounter (Signed)
Don erx 

## 2018-08-19 ENCOUNTER — Ambulatory Visit: Payer: BC Managed Care – PPO | Admitting: Cardiology

## 2018-08-22 ENCOUNTER — Encounter: Payer: Self-pay | Admitting: Cardiology

## 2018-08-22 ENCOUNTER — Other Ambulatory Visit: Payer: Self-pay

## 2018-08-22 ENCOUNTER — Ambulatory Visit: Payer: BC Managed Care – PPO | Admitting: Cardiology

## 2018-08-22 VITALS — BP 143/80 | HR 93 | Ht 64.0 in | Wt 226.0 lb

## 2018-08-22 DIAGNOSIS — I480 Paroxysmal atrial fibrillation: Secondary | ICD-10-CM | POA: Diagnosis not present

## 2018-08-22 DIAGNOSIS — R600 Localized edema: Secondary | ICD-10-CM | POA: Diagnosis not present

## 2018-08-22 DIAGNOSIS — Z8639 Personal history of other endocrine, nutritional and metabolic disease: Secondary | ICD-10-CM | POA: Diagnosis not present

## 2018-08-22 DIAGNOSIS — I10 Essential (primary) hypertension: Secondary | ICD-10-CM | POA: Diagnosis not present

## 2018-08-22 MED ORDER — FUROSEMIDE 20 MG PO TABS: 20.0000 mg | ORAL_TABLET | Freq: Two times a day (BID) | ORAL | 2 refills | Status: DC

## 2018-08-22 NOTE — Progress Notes (Signed)
Follow up visit  Subjective:   Donna Booker, female    DOB: 03/25/1945, 73 y.o.   MRN: [id number]   Chief Complaint  Patient presents with  . Hypertension  . Follow-up    HPI  73 year old African American female withf Graves' disease with hyperthyroidism, paroxysmal atrial fibrillation, mild PH, HFpEF (secondary to Afib), uncontrolled type II diabetes mellitus with diabetic polyneuropathy, CKD stage III  Blood pressure is well controlled. I noticed that her recent A2C in 04/2018 was 13. She has follow up with PCP next month.   Past Medical History:  Diagnosis Date  . Afib (HCC)   . Diabetes mellitus   . Graves disease   . Heart failure (HCC)   . Hypertension   . Hyperthyroidism      Past Surgical History:  Procedure Laterality Date  . BREAST SURGERY       Social History   Socioeconomic History  . Marital status: Married    Spouse name: Not on file  . Number of children: 1  . Years of education: Not on file  . Highest education level: Not on file  Occupational History  . Not on file  Social Needs  . Financial resource strain: Not on file  . Food insecurity    Worry: Not on file    Inability: Not on file  . Transportation needs    Medical: Not on file    Non-medical: Not on file  Tobacco Use  . Smoking status: Never Smoker  . Smokeless tobacco: Never Used  Substance and Sexual Activity  . Alcohol use: No  . Drug use: No  . Sexual activity: Not on file  Lifestyle  . Physical activity    Days per week: Not on file    Minutes per session: Not on file  . Stress: Not on file  Relationships  . Social Musicianconnections    Talks on phone: Not on file    Gets together: Not on file    Attends religious service: Not on file    Active member of club or organization: Not on file    Attends meetings of clubs or organizations: Not on file    Relationship status: Not on file  . Intimate partner violence    Fear of current or ex partner: Not on file   Emotionally abused: Not on file    Physically abused: Not on file    Forced sexual activity: Not on file  Other Topics Concern  . Not on file  Social History Narrative  . Not on file     Family History  Problem Relation Age of Onset  . Diabetes Mellitus II Mother   . Diabetes Mellitus II Father   . Hypertension Father   . Hyperlipidemia Father   . Heart disease Sister   . Stroke Sister   . Heart disease Brother   . Diabetes Brother   . Heart failure Brother      Current Outpatient Medications on File Prior to Visit  Medication Sig Dispense Refill  . amLODipine (NORVASC) 10 MG tablet TAKE 1 TABLET BY MOUTH EVERY DAY 90 tablet 2  . apixaban (ELIQUIS) 5 MG TABS tablet Take 1 tablet (5 mg total) by mouth 2 (two) times daily. 180 tablet 3  . BREO ELLIPTA 200-25 MCG/INH AEPB TAKE 1 PUFF BY MOUTH EVERY DAY 180 each 1  . colchicine 0.6 MG tablet TAKE 1 TABLET (0.6 MG TOTAL) BY MOUTH DAILY AS NEEDED (PAIN). 90 tablet 1  .  diclofenac sodium (VOLTAREN) 1 % GEL Apply 2 g topically 4 (four) times daily as needed (pain).    . Dulaglutide (TRULICITY) 1.5 VF/6.4PP SOPN Inject 1.5 mg into the skin once a week. 12 pen 3  . esomeprazole (NEXIUM) 40 MG capsule Take 40 mg by mouth daily before breakfast.    . furosemide (LASIX) 20 MG tablet TAKE 1 TABLET BY MOUTH EVERY DAY 90 tablet 1  . gabapentin (NEURONTIN) 300 MG capsule TAKE 1 CAPSULE BY MOUTH THREE TIMES A DAY 270 capsule 1  . glipiZIDE (GLUCOTROL) 5 MG tablet TAKE 2 TABLETS (10 MG TOTAL) BY MOUTH 2 (TWO) TIMES DAILY BEFORE A MEAL. 360 tablet 3  . Insulin Detemir (LEVEMIR FLEXTOUCH) 100 UNIT/ML Pen Inject 50 Units into the skin at bedtime. 10 pen 5  . metoprolol succinate (TOPROL-XL) 100 MG 24 hr tablet Take 1 tablet (100 mg total) by mouth daily. Take with or immediately following a meal. 90 tablet 3  . ondansetron (ZOFRAN) 4 MG tablet Take 1 tablet (4 mg total) by mouth every 8 (eight) hours as needed for nausea or vomiting. 12 tablet 0  .  pravastatin (PRAVACHOL) 40 MG tablet Take 1 tablet (40 mg total) by mouth daily at 6 PM. 90 tablet 1  . senna-docusate (SENOKOT-S) 8.6-50 MG tablet Take 1 tablet by mouth 2 (two) times daily. 60 tablet 11   No current facility-administered medications on file prior to visit.     Cardiovascular studies:  EKG 08/22/2018: Sinus rhythm 85 bpm.  Boderline LVH. Early repolarization, anteroseptal leads.   Echocardiogram 07/26/2017: Left ventricle cavity is normal in size. Moderate concentric hypertrophy of the left ventricle. Normal global wall motion. Doppler evidence of grade I (impaired) diastolic dysfunction, elevated LAP. Calculated EF 64%. Left atrial cavity is moderately dilated at 4.7 cm. Mild calcification of the aortic valve annulus. Trileaflet aortic valve with no regurgitation noted. Mild tricuspid regurgitation. Mild pulmonary hypertension. IVC is normal with respiratory variation. Compared to the hospital echocardiogram 04/16/2017, moderate RA enlargement not present  and moderate pulmonary hypertension is now mild.  Renal artery duplex ([facility]) 05/23/2017: No renal artery stenosis  Recent labs: Results for Donna Booker, Donna Booker (MRN [id number]) as of 08/22/2018 13:55  Ref. Range 03/19/2018 08:44 04/25/2018 60:63  BASIC METABOLIC PANEL Unknown  Rpt  COMPREHENSIVE METABOLIC PANEL Unknown Rpt (A)   Sodium Latest Ref Range: 137 - 147  137 142  Potassium Latest Ref Range: 3.4 - 5.3  3.9 4.1  Chloride Latest Ref Range: 96 - 112 mEq/L 100   CO2 Latest Ref Range: 19 - 32 mEq/L 28   Glucose Unknown 257 (H) 269  BUN Latest Ref Range: 4 - 21  21 18   Creatinine Latest Ref Range: 0.5 - 1.1  1.11 1.1  Calcium Latest Ref Range: 8.4 - 10.5 mg/dL 9.1   Alkaline Phosphatase Latest Ref Range: 25 - 125  121 (H) 124  Albumin Latest Ref Range: 3.5 - 5.2 g/dL 3.4 (L)   AST Latest Ref Range: 13 - 35  9 12 (A)  ALT Latest Ref Range: 7 - 35  12 13  Total Protein Latest Ref Range: 6.0 - 8.3  g/dL 6.7   Total Bilirubin Latest Ref Range: 0.2 - 1.2 mg/dL 0.2   Bilirubin, Total Unknown  0.2  GFR Latest Ref Range: >60.00 mL/min 58.68 (L)   Total CHOL/HDL Ratio Unknown 4   Cholesterol Latest Ref Range: 0 - 200 mg/dL 190   HDL Cholesterol Latest Ref Range: >39.00 mg/dL  46.00   Direct LDL Latest Units: mg/dL 54.6   NonHDL Unknown 270.35   Triglycerides Latest Ref Range: 0.0 - 149.0 mg/dL 009.3 (H)   VLDL Latest Ref Range: 0.0 - 40.0 mg/dL 81.8 (H)    Results for AYANI, OSPINA (MRN [id number]) as of 08/22/2018 13:55  Ref. Range 03/19/2018 08:44 04/25/2018 00:00  WBC Unknown 12.3 (H) 11.2  RBC Latest Ref Range: 3.87 - 5.11 Mil/uL 5.02   Hemoglobin Latest Ref Range: 12.0 - 16.0  12.1 12.0  HCT Latest Ref Range: 36 - 46  37.7 38  MCV Latest Ref Range: 78.0 - 100.0 fl 75.1 (L)   MCHC Latest Ref Range: 30.0 - 36.0 g/dL 78.9   RDW Latest Ref Range: 11.5 - 15.5 % 15.0   Platelets Latest Ref Range: 150 - 399  306.0 341   Results for LAPRECIOUS, AUSTILL (MRN [id number]) as of 08/22/2018 13:55  Ref. Range 12/28/2017 10:13 03/19/2018 08:44 04/25/2018 00:00 04/26/2018 00:00  Glucose Unknown  257 (H) 269   Hemoglobin A1C Unknown 13.1 (A) 14.1 (H)  13.0   Review of Systems  Constitution: Negative for decreased appetite, malaise/fatigue, weight gain and weight loss.  HENT: Negative for congestion.   Eyes: Negative for visual disturbance.  Cardiovascular: Negative for chest pain, dyspnea on exertion, leg swelling, palpitations and syncope.  Respiratory: Negative for cough.   Endocrine: Negative for cold intolerance.  Hematologic/Lymphatic: Does not bruise/bleed easily.  Skin: Negative for itching and rash.  Musculoskeletal: Negative for myalgias.  Gastrointestinal: Negative for abdominal pain, nausea and vomiting.  Genitourinary: Negative for dysuria.  Neurological: Negative for dizziness and weakness.  Psychiatric/Behavioral: The patient is not nervous/anxious.   All other systems reviewed  and are negative.        Vitals:   08/22/18 1335  BP: (!) 143/80  Pulse: 93  SpO2: 97%    Body mass index is 38.79 kg/m. Filed Weights   08/22/18 1335  Weight: 102.5 kg     Objective:   Physical Exam  Constitutional: She is oriented to person, place, and time. She appears well-developed and well-nourished. No distress.  HENT:  Head: Normocephalic and atraumatic.  Eyes: Pupils are equal, round, and reactive to light. Conjunctivae are normal.  Neck: No JVD present.  Cardiovascular: Normal rate, regular rhythm and intact distal pulses.  Pulmonary/Chest: Effort normal and breath sounds normal. She has no wheezes. She has no rales.  Abdominal: Soft. Bowel sounds are normal. There is no rebound.  Musculoskeletal:        General: No edema.  Lymphadenopathy:    She has no cervical adenopathy.  Neurological: She is alert and oriented to person, place, and time. No cranial nerve deficit.  Skin: Skin is warm and dry.  Psychiatric: She has a normal mood and affect.  Nursing note and vitals reviewed.         Assessment & Recommendations:   73 year old African American female withf Graves' disease with hyperthyroidism, paroxysmal atrial fibrillation, mild PH, HFpEF (secondary to Afib), uncontrolled type II diabetes mellitus with diabetic polyneuropathy, CKD stage III  PAF: Maintaining sinus rhythm. CHA2DS2VASc score 4, annual stroke risk 5%. Continue anticaogualtion with eliquis 5 mg bid.  Hypertension: Better controlled.  Continue metoprolol succinate to 100 mg daily. Continue amodipine,  She should not be on ACEi/ARB/aldosterone antagonist due to pevious episodes of hyperkalemia.  Grave's disease, DM:  Needs to be followed closely. A1C 13 in 04/2018. I have encouraged her to follow up with her PCP Dr Okey Dupre  soon.    F/u in 4 wks.   Elder Negus, MD [facility] [place] Pager: [phone] Office: [phone] If no answer Cell [phone]

## 2018-08-26 ENCOUNTER — Other Ambulatory Visit: Payer: Self-pay | Admitting: Internal Medicine

## 2018-09-01 ENCOUNTER — Other Ambulatory Visit: Payer: Self-pay | Admitting: Cardiology

## 2018-09-01 DIAGNOSIS — R600 Localized edema: Secondary | ICD-10-CM

## 2018-09-14 ENCOUNTER — Other Ambulatory Visit: Payer: Self-pay | Admitting: Internal Medicine

## 2018-10-03 ENCOUNTER — Other Ambulatory Visit: Payer: Self-pay | Admitting: Cardiology

## 2018-10-04 ENCOUNTER — Telehealth: Payer: Self-pay

## 2018-10-04 NOTE — Telephone Encounter (Signed)
Telephone encounter:  Reason for call: Pt called to inform us that she is still having some fluid on both legs. Pt would like to know should she increase her since she is taking Lasix 20mg  1 tab BID. Please advice thank you  Usual provider: Dr. Pricilla Holm  Last office visit: 08/22/2018  Next office visit: 02/26/2019   Last hospitalization: 11/28/2017   Current Outpatient Medications on File Prior to Visit  Medication Sig Dispense Refill  . amLODipine (NORVASC) 10 MG tablet TAKE 1 TABLET BY MOUTH EVERY DAY 90 tablet 2  . apixaban (ELIQUIS) 5 MG TABS tablet Take 1 tablet (5 mg total) by mouth 2 (two) times daily. 180 tablet 3  . BREO ELLIPTA 200-25 MCG/INH AEPB TAKE 1 PUFF BY MOUTH EVERY DAY 180 each 1  . diclofenac sodium (VOLTAREN) 1 % GEL Apply 2 g topically 4 (four) times daily as needed (pain).    . Dulaglutide (TRULICITY) 1.5 PP/5.0DT SOPN Inject 1.5 mg into the skin once a week. 12 pen 3  . esomeprazole (NEXIUM) 40 MG capsule Take 40 mg by mouth daily before breakfast.    . furosemide (LASIX) 20 MG tablet TAKE 1 TABLET BY MOUTH EVERY DAY 90 tablet 1  . gabapentin (NEURONTIN) 300 MG capsule TAKE 1 CAPSULE BY MOUTH THREE TIMES A DAY 270 capsule 1  . glipiZIDE (GLUCOTROL) 5 MG tablet TAKE 2 TABLETS (10 MG TOTAL) BY MOUTH 2 (TWO) TIMES DAILY BEFORE A MEAL. 360 tablet 3  . LEVEMIR FLEXTOUCH 100 UNIT/ML Pen INJECT 35 UNITS INTO THE SKIN DAILY AT 10 PM 15 mL 2  . metoprolol succinate (TOPROL-XL) 100 MG 24 hr tablet Take 1 tablet (100 mg total) by mouth daily. Take with or immediately following a meal. 90 tablet 3  . pravastatin (PRAVACHOL) 40 MG tablet TAKE 1 TABLET (40 MG TOTAL) BY MOUTH DAILY AT 6 PM. 90 tablet 1   No current facility-administered medications on file prior to visit.

## 2018-10-04 NOTE — Telephone Encounter (Signed)
Yes, Okay to increase lasix to 20 mg bid. Keep [place] posted in 1-2 weeks if it is helping.  Thanks MJP

## 2018-10-04 NOTE — Telephone Encounter (Signed)
Called pt to inform her about the message below. Pt understood

## 2018-10-05 ENCOUNTER — Other Ambulatory Visit: Payer: Self-pay | Admitting: Cardiology

## 2018-10-12 ENCOUNTER — Other Ambulatory Visit: Payer: Self-pay | Admitting: Internal Medicine

## 2018-12-02 ENCOUNTER — Telehealth: Payer: Self-pay

## 2018-12-02 DIAGNOSIS — R600 Localized edema: Secondary | ICD-10-CM

## 2018-12-02 DIAGNOSIS — I10 Essential (primary) hypertension: Secondary | ICD-10-CM

## 2018-12-02 MED ORDER — FUROSEMIDE 20 MG PO TABS: 20.0000 mg | ORAL_TABLET | Freq: Every day | ORAL | 1 refills | Status: DC

## 2018-12-02 MED ORDER — AMLODIPINE BESYLATE 10 MG PO TABS: 10.0000 mg | ORAL_TABLET | Freq: Every day | ORAL | 2 refills | Status: DC

## 2018-12-03 ENCOUNTER — Other Ambulatory Visit: Payer: Self-pay

## 2018-12-03 DIAGNOSIS — I10 Essential (primary) hypertension: Secondary | ICD-10-CM

## 2018-12-03 DIAGNOSIS — R600 Localized edema: Secondary | ICD-10-CM

## 2018-12-03 MED ORDER — FUROSEMIDE 20 MG PO TABS: 40.0000 mg | ORAL_TABLET | Freq: Two times a day (BID) | ORAL | 3 refills | Status: DC

## 2018-12-03 MED ORDER — AMLODIPINE BESYLATE 10 MG PO TABS: 10.0000 mg | ORAL_TABLET | Freq: Two times a day (BID) | ORAL | 3 refills | Status: DC

## 2018-12-03 NOTE — Telephone Encounter (Signed)
Pt called she went to infusion yesterday and her bp was 188/100; She needs refills

## 2018-12-09 ENCOUNTER — Ambulatory Visit: Payer: BLUE CROSS/BLUE SHIELD | Admitting: Internal Medicine

## 2019-01-16 ENCOUNTER — Other Ambulatory Visit: Payer: Self-pay | Admitting: Internal Medicine

## 2019-01-18 ENCOUNTER — Other Ambulatory Visit: Payer: Self-pay | Admitting: Cardiology

## 2019-01-18 DIAGNOSIS — I10 Essential (primary) hypertension: Secondary | ICD-10-CM

## 2019-01-20 ENCOUNTER — Other Ambulatory Visit: Payer: Self-pay | Admitting: Internal Medicine

## 2019-01-20 NOTE — Telephone Encounter (Signed)
Please refill as per office routine med refill policy (all routine meds refilled for 3 mo or monthly per pt preference up to one year from last visit, then month to month grace period for 3 mo, then further med refills will have to be denied)  

## 2019-01-25 IMAGING — CT CT HEAD W/O CM
3 series · 15 of 47 positions shown, 18 images · non-contrast
Comparison: CT scan of July 09, 2011.

CLINICAL DATA: Headache.

EXAM:
CT HEAD WITHOUT CONTRAST
TECHNIQUE: Contiguous axial images were obtained from the base of the skull
through the vertex without intravenous contrast.

[Series 3: head 5.0 h30s · axial · 0.42mm/px · z∈[-84,+41]mm · 9 of 31 slices shown, 12 images]
[im 3/31  brain]
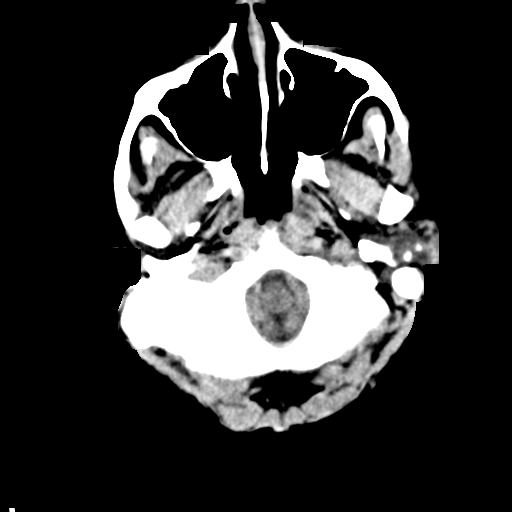
[im 3/31  bone]
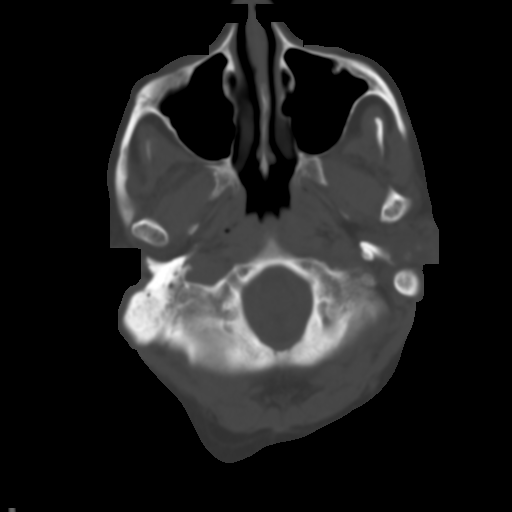
[im 6/31  brain]
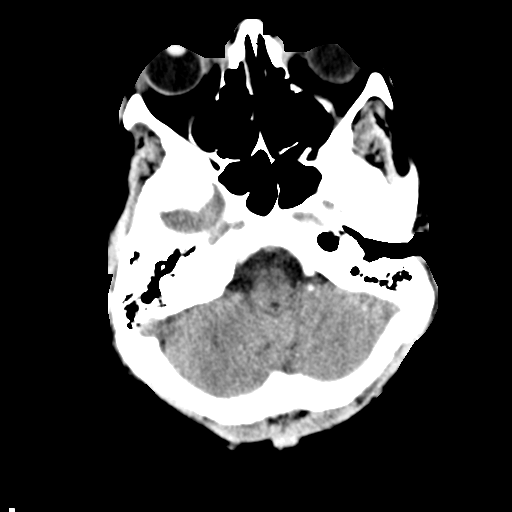
[im 9/31  brain]
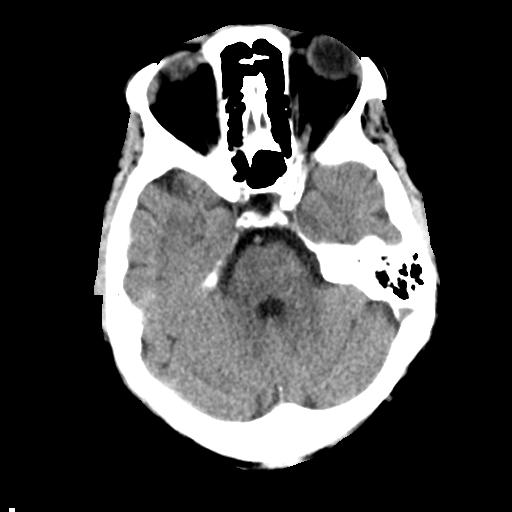
[im 12/31  brain]
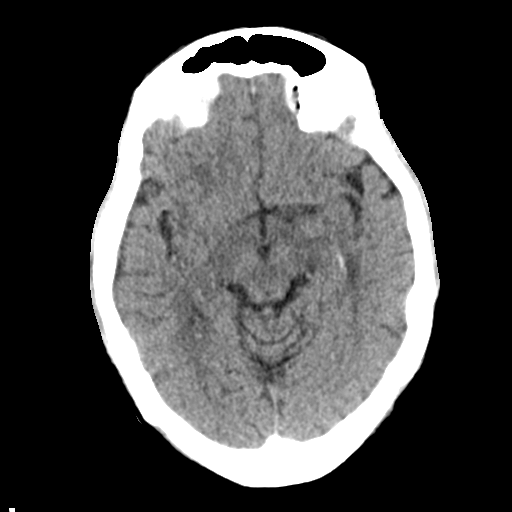
[im 16/31  brain]
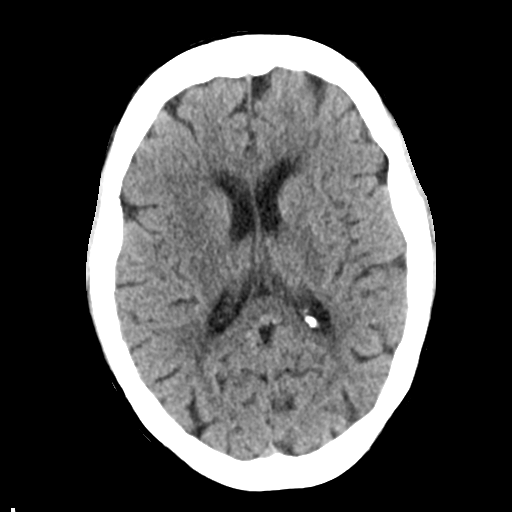
[im 16/31  bone]
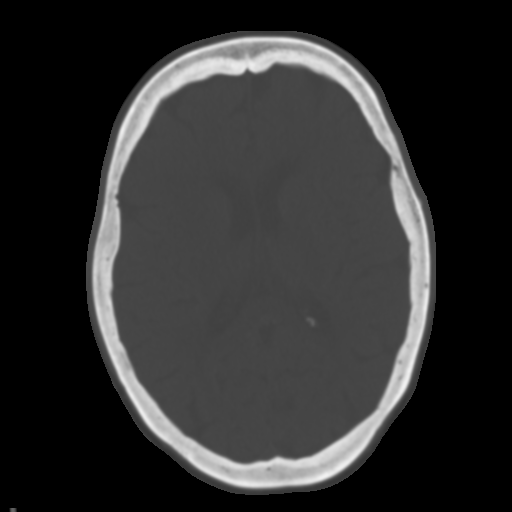
[im 19/31  brain]
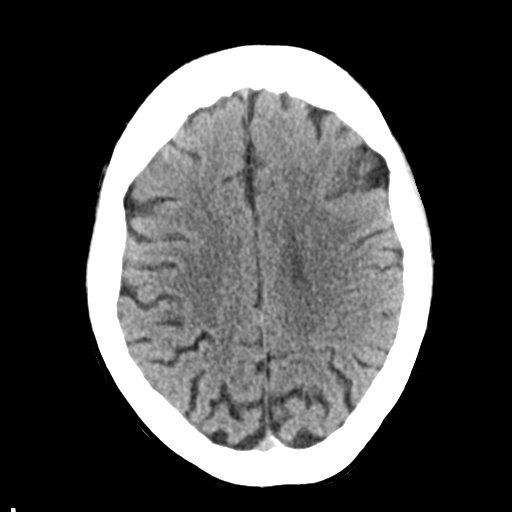
[im 22/31  brain]
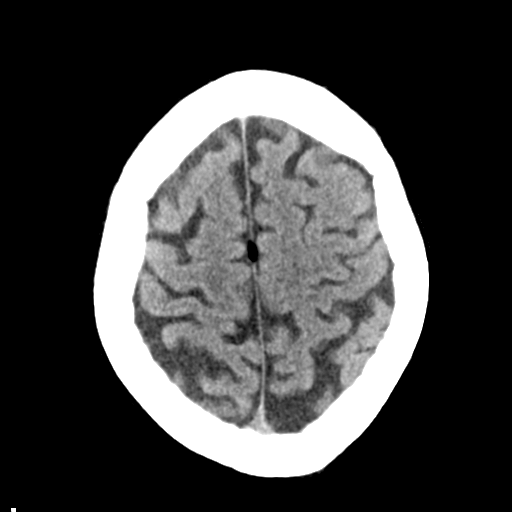
[im 25/31  brain]
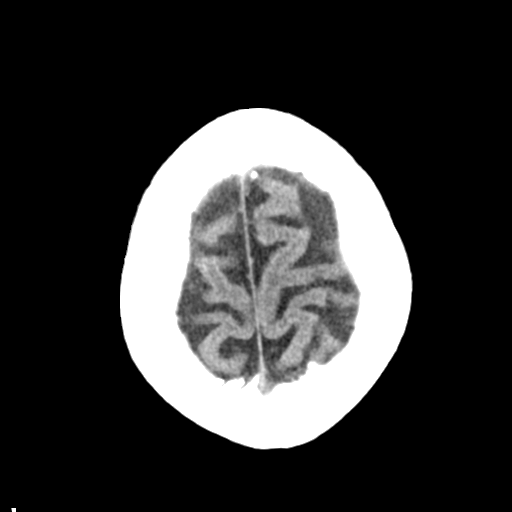
[im 28/31  brain]
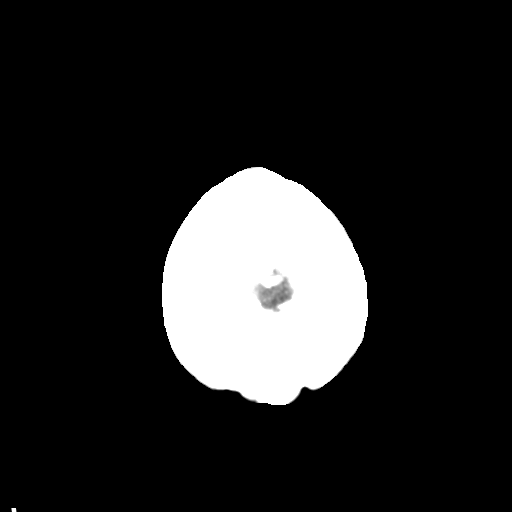
[im 28/31  bone]
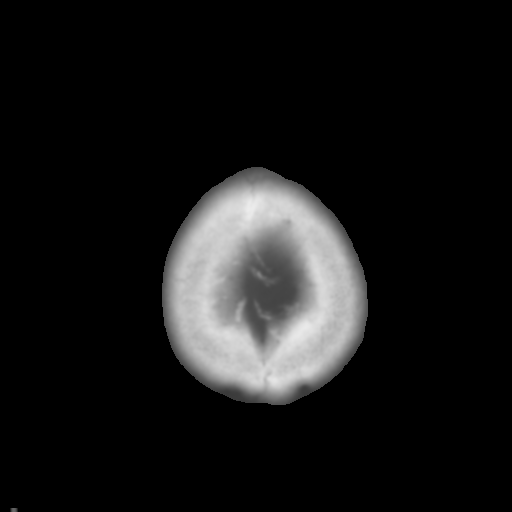

[Series 5: head 3.0 mpr cor · coronal · 0.30mm/px · 3 of 70 slices shown]
[im 24/70  brain]
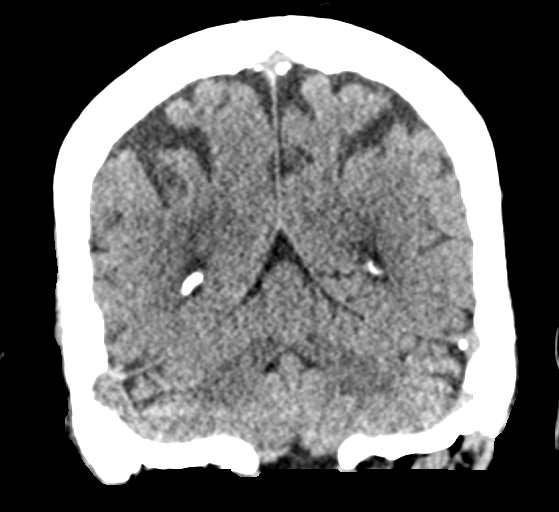
[im 31/70  brain]
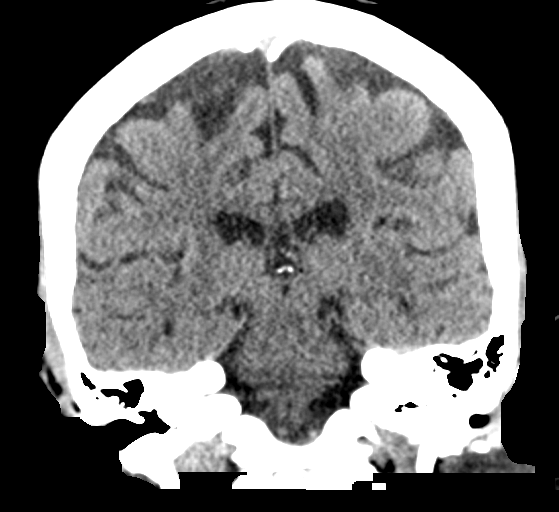
[im 39/70  brain]
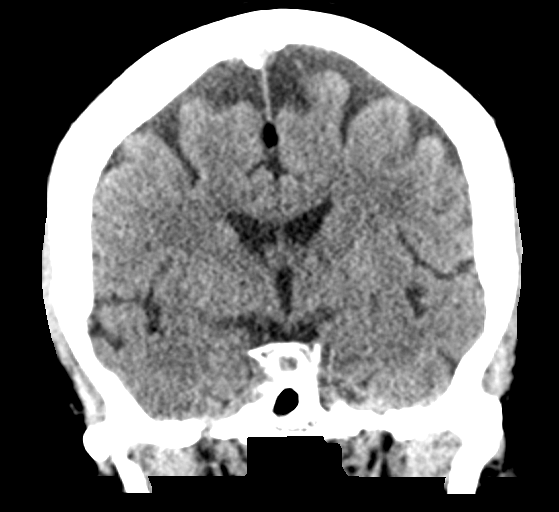

[Series 6: head 3.0 mpr sag · sagittal · 0.31mm/px · 3 of 58 slices shown]
[im 20/58  brain]
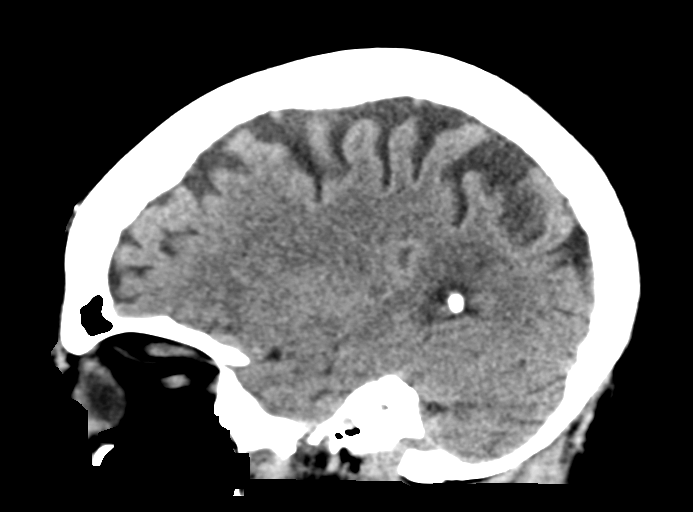
[im 29/58  brain]
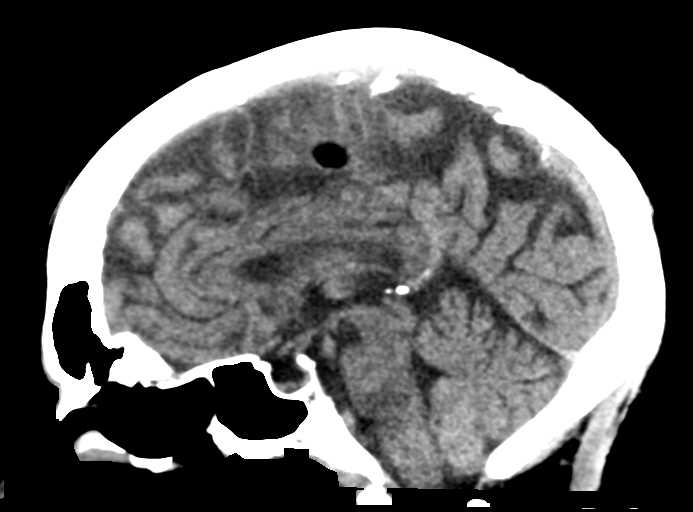
[im 39/58  brain]
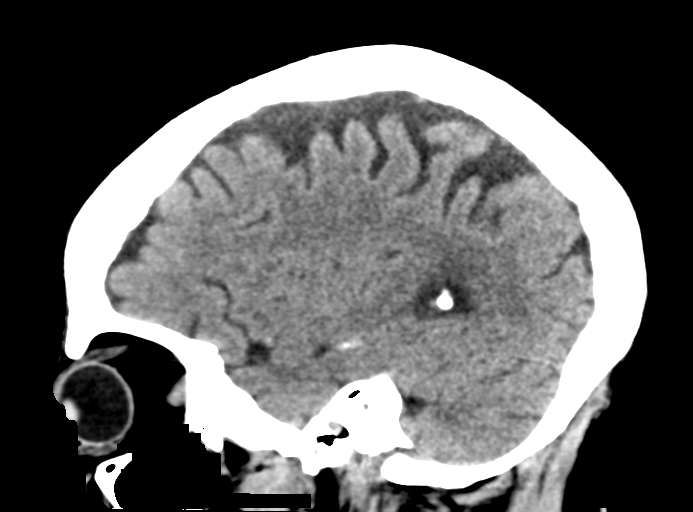

[15 of 47 positions shown; findings below may reference images not displayed]

FINDINGS: Brain: Mild chronic ischemic white matter disease is noted. Mild
diffuse cortical atrophy is noted. No mass effect or midline shift
is noted. Ventricular size is within normal limits. There is no
evidence of mass lesion, hemorrhage or acute infarction.

Vascular: No hyperdense vessel or unexpected calcification.

Skull: Normal. Negative for fracture or focal lesion.

Sinuses/Orbits: No acute finding.

Other: None.
IMPRESSION: Mild chronic ischemic white matter disease. Mild diffuse cortical
atrophy. No acute intracranial abnormality seen.

## 2019-01-31 ENCOUNTER — Other Ambulatory Visit: Payer: Self-pay | Admitting: Internal Medicine

## 2019-02-03 ENCOUNTER — Other Ambulatory Visit: Payer: Self-pay

## 2019-02-03 DIAGNOSIS — R600 Localized edema: Secondary | ICD-10-CM

## 2019-02-03 DIAGNOSIS — I10 Essential (primary) hypertension: Secondary | ICD-10-CM

## 2019-02-03 MED ORDER — PRAVASTATIN SODIUM 40 MG PO TABS: 40.0000 mg | ORAL_TABLET | Freq: Every day | ORAL | 1 refills | Status: DC

## 2019-02-03 MED ORDER — METOPROLOL SUCCINATE ER 100 MG PO TB24: 100.0000 mg | ORAL_TABLET | Freq: Every day | ORAL | 1 refills | Status: DC

## 2019-02-03 MED ORDER — APIXABAN 5 MG PO TABS: 5.0000 mg | ORAL_TABLET | Freq: Two times a day (BID) | ORAL | 1 refills | Status: DC

## 2019-02-03 MED ORDER — METOPROLOL SUCCINATE ER 50 MG PO TB24: 50.0000 mg | ORAL_TABLET | Freq: Every day | ORAL | 1 refills | Status: DC

## 2019-02-03 MED ORDER — AMLODIPINE BESYLATE 10 MG PO TABS: 10.0000 mg | ORAL_TABLET | Freq: Two times a day (BID) | ORAL | 1 refills | Status: DC

## 2019-02-03 MED ORDER — FUROSEMIDE 20 MG PO TABS: 40.0000 mg | ORAL_TABLET | Freq: Two times a day (BID) | ORAL | 1 refills | Status: DC

## 2019-02-05 ENCOUNTER — Telehealth: Payer: Self-pay | Admitting: Internal Medicine

## 2019-02-05 NOTE — Telephone Encounter (Signed)
   *  STAT* If patient is at the pharmacy, call can be transferred to refill team.   1. Which medications need to be refilled? (please list name of each medication and dose if known)  fluticasone furoate-vilanterol (BREO ELLIPTA) 200-25 MCG/INH AEPB  2. Which pharmacy/location (including street and city if local pharmacy) is medication to be sent to? [facility] [place], [place] - [address] AT Portal to Registered [employer] Sites  3. Do they need a 30 day or 90 day supply? 90

## 2019-02-06 NOTE — Telephone Encounter (Signed)
Request already responded to.

## 2019-02-12 ENCOUNTER — Telehealth: Payer: Self-pay | Admitting: Internal Medicine

## 2019-02-12 NOTE — Telephone Encounter (Signed)
Patient is calling about the fluticasone furoate-vilanterol (BREO ELLIPTA) 200-25 MCG/INH AEPB  She states its cost over $500. She would like to know if something else can be called in.

## 2019-02-12 NOTE — Telephone Encounter (Signed)
Pt has been informed to check with insurance company for similar medications in her price range.

## 2019-02-17 ENCOUNTER — Telehealth: Payer: Self-pay

## 2019-02-17 NOTE — Telephone Encounter (Signed)
Per pt phone call: Donna Booker is too expensive. Pt states pharmacist recommended FLUTICASONE PROPIONTE nasal spray as alternative that may be more affordable. Please call to [facility] [address] per pt request.

## 2019-02-17 NOTE — Telephone Encounter (Signed)
MD is out of the office pls advise on msg below../lmb 

## 2019-02-18 ENCOUNTER — Other Ambulatory Visit: Payer: Self-pay | Admitting: Internal Medicine

## 2019-02-18 NOTE — Telephone Encounter (Signed)
Donna Booker we can use a nasal spray in place of the inhaler.  As there are any other alternatives available? Thanks

## 2019-02-20 ENCOUNTER — Ambulatory Visit: Payer: BLUE CROSS/BLUE SHIELD | Admitting: Internal Medicine

## 2019-02-26 ENCOUNTER — Other Ambulatory Visit: Payer: Self-pay

## 2019-02-26 ENCOUNTER — Ambulatory Visit: Payer: Medicare Other | Admitting: Cardiology

## 2019-02-26 ENCOUNTER — Encounter: Payer: Self-pay | Admitting: Cardiology

## 2019-02-26 VITALS — BP 174/88 | HR 90 | Ht 64.0 in | Wt 212.1 lb

## 2019-02-26 DIAGNOSIS — E118 Type 2 diabetes mellitus with unspecified complications: Secondary | ICD-10-CM

## 2019-02-26 DIAGNOSIS — Z8639 Personal history of other endocrine, nutritional and metabolic disease: Secondary | ICD-10-CM

## 2019-02-26 DIAGNOSIS — I480 Paroxysmal atrial fibrillation: Secondary | ICD-10-CM

## 2019-02-26 DIAGNOSIS — E782 Mixed hyperlipidemia: Secondary | ICD-10-CM | POA: Insufficient documentation

## 2019-02-26 DIAGNOSIS — I10 Essential (primary) hypertension: Secondary | ICD-10-CM

## 2019-02-26 MED ORDER — BIDIL 20-37.5 MG PO TABS: 1.0000 | ORAL_TABLET | Freq: Three times a day (TID) | ORAL | 2 refills | Status: DC

## 2019-02-26 MED ORDER — APIXABAN 5 MG PO TABS: 5.0000 mg | ORAL_TABLET | Freq: Two times a day (BID) | ORAL | 3 refills | Status: DC

## 2019-02-26 NOTE — Progress Notes (Signed)
Follow up visit  Subjective:   Donna Booker, female    DOB: [birth year], 74 y.o.   MRN: [id number]   Chief Complaint  Patient presents with  . Atrial Fibrillation  . Follow-up    61mo    HPI  74year old African American female withf Graves' disease with hyperthyroidism, paroxysmal atrial fibrillation, mild PH, HFpEF (secondary to Afib), uncontrolled type II diabetes mellitus with diabetic polyneuropathy, CKD stage III.  Blood pressure has been elevated in recent times. She denies chest pain, shortness of breath, palpitations, leg edema, orthopnea, PND, TIA/syncope. She has not seen her endocrinologist in a while.   Current Outpatient Medications on File Prior to Visit  Medication Sig Dispense Refill  . Abatacept (ORENCIA [place]) Inject into the skin every 30 (thirty) days.    .Marland KitchenamLODipine (NORVASC) 10 MG tablet Take 1 tablet (10 mg total) by mouth 2 (two) times daily. 180 tablet 1  . Apoaequorin (PREVAGEN PO) Take 1 capsule by mouth daily.    . colchicine 0.6 MG tablet TAKE 1 TABLET BY MOUTH DAILY AS NEEDED FOR PAIN 90 tablet 1  . esomeprazole (NEXIUM) 40 MG capsule Take 40 mg by mouth daily before breakfast.    . furosemide (LASIX) 20 MG tablet Take 2 tablets (40 mg total) by mouth 2 (two) times daily. 180 tablet 1  . gabapentin (NEURONTIN) 300 MG capsule TAKE 1 CAPSULE BY MOUTH THREE TIMES A DAY 270 capsule 1  . glipiZIDE (GLUCOTROL) 5 MG tablet TAKE 2 TABLETS (10 MG TOTAL) BY MOUTH 2 (TWO) TIMES DAILY BEFORE A MEAL. 360 tablet 3  . LEVEMIR FLEXTOUCH 100 UNIT/ML Pen INJECT 35 UNITS INTO THE SKIN DAILY AT 10 PM (Patient taking differently: 50 Units. ) 15 mL 1  . metoprolol succinate (TOPROL-XL) 100 MG 24 hr tablet Take 1 tablet (100 mg total) by mouth daily. Take with or immediately following a meal. 90 tablet 1  . pravastatin (PRAVACHOL) 40 MG tablet Take 1 tablet (40 mg total) by mouth daily at 6 PM. 90 tablet 1   No current facility-administered medications on file prior to  visit.    Cardiovascular studies:  EKG 02/26/2019: Sinus rhythm 88 vpm.  Borderline left atrial enlargement.  Cannot exclude old anteroseptal infarct.  EKG 08/22/2018: Sinus rhythm 85 bpm.  Boderline LVH. Early repolarization, anteroseptal leads.   Echocardiogram 07/26/2017: Left ventricle cavity is normal in size. Moderate concentric hypertrophy of the left ventricle. Normal global wall motion. Doppler evidence of grade I (impaired) diastolic dysfunction, elevated LAP. Calculated EF 64%. Left atrial cavity is moderately dilated at 4.7 cm. Mild calcification of the aortic valve annulus. Trileaflet aortic valve with no regurgitation noted. Mild tricuspid regurgitation. Mild pulmonary hypertension. IVC is normal with respiratory variation. Compared to the hospital echocardiogram 04/16/2017, moderate RA enlargement not present  and moderate pulmonary hypertension is now mild.  Renal artery duplex ([facility]) 05/23/2017: No renal artery stenosis  Recent labs: 04/25/2018: Glucose 269, BUN/Cr 18/1.1. EGFR 58. Na/K 142/4.1. Rest of the CMP normal H/H 12/37. MCV 75. Platelets 306 HbA1C 13% Chol 190, TG 217, HDL 46, LDL 98   Review of Systems  Cardiovascular: Negative for chest pain, dyspnea on exertion, leg swelling, palpitations and syncope.         Vitals:   02/26/19 1419  BP: (!) 174/88  Pulse: 90  SpO2: 97%     Body mass index is 36.41 kg/m. Filed Weights   02/26/19 1419  Weight: 212 lb 1.6 oz (96.2 kg)  Objective:   Physical Exam  Constitutional: She appears well-developed and well-nourished.  Neck: No JVD present.  Cardiovascular: Normal rate, regular rhythm, normal heart sounds and intact distal pulses.  No murmur heard. Pulmonary/Chest: Effort normal and breath sounds normal. She has no wheezes. She has no rales.  Musculoskeletal:        General: No edema.  Nursing note and vitals reviewed.         Assessment & Recommendations:    74 year old African American female withf Graves' disease with hyperthyroidism, paroxysmal atrial fibrillation, mild PH, HFpEF (secondary to Afib), uncontrolled type II diabetes mellitus with diabetic polyneuropathy, CKD stage III  PAF: Maintaining sinus rhythm. CHA2DS2VASc score 4, annual stroke risk 5%. Continue anticaogualtion with eliquis 5 mg bid.  Hypertension: Uncontrolled. Will check TSH. Continue metoprolol succinate 100 mg daily, amlodipine 10 mg daily.  Added Bidil 20-37.5 mg tid. She should not be on ACEi/ARB/aldosterone antagonist due to pevious episodes of hyperkalemia.  Grave's disease, DM:  Needs f/u w/endocrinology. I will check labs today.   F/u in 4 weeks to reassess hypertension.    Nigel Mormon, MD [facility] [place] Pager: [phone] Office: [phone] If no answer Cell [phone]

## 2019-02-27 ENCOUNTER — Telehealth: Payer: Self-pay

## 2019-02-27 LAB — CBC
Hematocrit: 39.1 % (ref 34.0–46.6)
Hemoglobin: 12.1 g/dL (ref 11.1–15.9)
MCH: 23 pg — ABNORMAL LOW (ref 26.6–33.0)
MCHC: 30.9 g/dL — ABNORMAL LOW (ref 31.5–35.7)
MCV: 75 fL — ABNORMAL LOW (ref 79–97)
Platelets: 379 10*3/uL (ref 150–450)
RBC: 5.25 x10E6/uL (ref 3.77–5.28)
RDW: 14.5 % (ref 11.7–15.4)
WBC: 13.4 10*3/uL — ABNORMAL HIGH (ref 3.4–10.8)

## 2019-02-27 LAB — COMPREHENSIVE METABOLIC PANEL
ALT: 8 IU/L (ref 0–32)
AST: 13 IU/L (ref 0–40)
Albumin/Globulin Ratio: 1.2 (ref 1.2–2.2)
Albumin: 3.8 g/dL (ref 3.7–4.7)
Alkaline Phosphatase: 118 IU/L — ABNORMAL HIGH (ref 39–117)
BUN/Creatinine Ratio: 15 (ref 12–28)
BUN: 19 mg/dL (ref 8–27)
Bilirubin Total: <0.2 mg/dL (ref 0.0–1.2)
CO2: 24 mmol/L (ref 20–29)
Calcium: 9.9 mg/dL (ref 8.7–10.3)
Chloride: 101 mmol/L (ref 96–106)
Creatinine, Ser: 1.29 mg/dL — ABNORMAL HIGH (ref 0.57–1.00)
GFR calc Af Amer: 48 mL/min/{1.73_m2} — ABNORMAL LOW (ref >59)
GFR calc non Af Amer: 42 mL/min/{1.73_m2} — ABNORMAL LOW (ref >59)
Globulin, Total: 3.1 g/dL (ref 1.5–4.5)
Glucose: 230 mg/dL — ABNORMAL HIGH (ref 65–99)
Potassium: 4.9 mmol/L (ref 3.5–5.2)
Sodium: 141 mmol/L (ref 134–144)
Total Protein: 6.9 g/dL (ref 6.0–8.5)

## 2019-02-27 LAB — HEMOGLOBIN A1C
Est. average glucose Bld gHb Est-mCnc: 206 mg/dL
Hgb A1c MFr Bld: 8.8 % — ABNORMAL HIGH (ref 4.8–5.6)

## 2019-02-27 LAB — TSH: TSH: <0.005 u[IU]/mL — ABNORMAL LOW (ref 0.450–4.500)

## 2019-02-27 NOTE — Progress Notes (Signed)
Dr. Elvera Lennox,  I saw the patient on 2/24. Given her BP now being out of control, I checked TSH which is again <0.005. She told me she couldn't see you virtually recently. Could you follow up please?  @Ma 's, Please let the patient know her diabetes has improved, but thyroid function is abnormal. She needs to see Dr. to follow up soon. I have sent Dr. Elvera Lennox a message.   Thanks MJP

## 2019-02-27 NOTE — Telephone Encounter (Signed)
-----   Message from Carlus Pavlov, MD sent at 02/27/2019  7:59 AM EST ----- M, Can we schedule this pt for follow-up in the next few days? Ty, C ----- Message ----- From: Elder Negus, MD Sent: 02/27/2019   7:46 AM EST To: Carlus Pavlov, MD, #  Dr. Elvera Lennox,  I saw the patient on 2/24. Given her BP now being out of control, I checked TSH which is again <0.005. She told me she couldn't see you virtually recently. Could you follow up please?  @Ma 's, Please let the patient know her diabetes has improved, but thyroid function is abnormal. She needs to see Dr. to follow up soon. I have sent Dr. Elvera Lennox a message.   Thanks MJP

## 2019-02-27 NOTE — Telephone Encounter (Signed)
Natalia Leatherwood,  Can you see if she can come in Monday? I think she has openings but I know the March schedule had some issues.

## 2019-02-27 NOTE — Progress Notes (Signed)
Called patient, NA, LMAM to call back about lab results.

## 2019-02-28 ENCOUNTER — Other Ambulatory Visit: Payer: Self-pay

## 2019-02-28 DIAGNOSIS — I10 Essential (primary) hypertension: Secondary | ICD-10-CM

## 2019-02-28 MED ORDER — BIDIL 20-37.5 MG PO TABS: 1.0000 | ORAL_TABLET | Freq: Three times a day (TID) | ORAL | 2 refills | Status: DC

## 2019-02-28 NOTE — Telephone Encounter (Signed)
Left messages on both numbers yesterday and again today - no return call as of yet.  Patient is not on MyChart

## 2019-03-03 ENCOUNTER — Other Ambulatory Visit: Payer: Self-pay

## 2019-03-03 ENCOUNTER — Ambulatory Visit (INDEPENDENT_AMBULATORY_CARE_PROVIDER_SITE_OTHER): Payer: Medicare Other | Admitting: Internal Medicine

## 2019-03-03 ENCOUNTER — Encounter: Payer: Self-pay | Admitting: Internal Medicine

## 2019-03-03 VITALS — BP 150/80 | HR 90 | Ht 63.78 in | Wt 217.0 lb

## 2019-03-03 DIAGNOSIS — E0500 Thyrotoxicosis with diffuse goiter without thyrotoxic crisis or storm: Secondary | ICD-10-CM | POA: Diagnosis not present

## 2019-03-03 DIAGNOSIS — E1169 Type 2 diabetes mellitus with other specified complication: Secondary | ICD-10-CM | POA: Diagnosis not present

## 2019-03-03 DIAGNOSIS — E118 Type 2 diabetes mellitus with unspecified complications: Secondary | ICD-10-CM

## 2019-03-03 DIAGNOSIS — E785 Hyperlipidemia, unspecified: Secondary | ICD-10-CM

## 2019-03-03 LAB — TSH: TSH: <0.01 u[IU]/mL — ABNORMAL LOW (ref 0.35–4.50)

## 2019-03-03 LAB — T4, FREE: Free T4: 1.98 ng/dL — ABNORMAL HIGH (ref 0.60–1.60)

## 2019-03-03 LAB — T3, FREE: T3, Free: 4.4 pg/mL — ABNORMAL HIGH (ref 2.3–4.2)

## 2019-03-03 MED ORDER — METHIMAZOLE 5 MG PO TABS: 5.0000 mg | ORAL_TABLET | Freq: Two times a day (BID) | ORAL | 5 refills | Status: DC

## 2019-03-03 MED ORDER — TRULICITY 3 MG/0.5ML ~~LOC~~ SOAJ: 3.0000 mg | SUBCUTANEOUS | 11 refills | Status: DC

## 2019-03-03 NOTE — Patient Instructions (Signed)
Please continue: - Glipizide 10 mg 2x a day before meals  - Levemir 50 units at night   Please increase: - Trulicity to 3 mg weekly  Please stop at the lab.  Please come back for labs in 1 month.  Come back for a follow up appt in 3 months.

## 2019-03-03 NOTE — Progress Notes (Signed)
Patient ID: Donna Booker, female   DOB: 1945/01/16, 74 y.o.   MRN: [id number]  This visit occurred during the SARS-CoV-2 public health emergency.  Safety protocols were in place, including screening questions prior to the visit, additional usage of staff PPE, and extensive cleaning of exam room while observing appropriate contact time as indicated for disinfecting solutions.   HPI: Donna Booker is a 74 y.o.-year-old female, presenting for f/u for DM2, dx in 1987, insulin-dependent since 2006, uncontrolled, with complications (PN, stage 2-3 CKD)and Graves ds. Last visit 11 months ago.  Graves ds  - diagnosed during admission for A. fib with RVR in 05/2017  Reviewed her TFTs: Lab Results  Component Value Date   TSH <0.005 (L) 02/26/2019   TSH 10.31 (H) 12/04/2017   TSH <0.010 (L) 07/14/2017   TSH <0.01 (L) 07/06/2017   TSH <0.01 Repeated and verified X2. (L) 05/10/2017   TSH <0.010 (L) 04/16/2017   Lab Results  Component Value Date   FREET4 0.57 (L) 12/04/2017   FREET4 0.71 (L) 07/15/2017   FREET4 0.72 07/06/2017   FREET4 1.15 05/10/2017   FREET4 5.15 (H) 04/16/2017   Lab Results  Component Value Date   T3FREE 2.8 12/04/2017   T3FREE 2.1 07/15/2017   T3FREE 4.2 05/10/2017   T3FREE 18.5 (H) 04/16/2017   Her Graves' antibodies are elevated: Lab Results  Component Value Date   TSI 8.02 (H) 04/16/2017   She was started on methimazole 10 mg 2x a day and atenolol 50 mg 3x a day.Her TFTs were significantly improved >> we decreased the methimazole to 5 mg 2X a day, but then on 5 mg daily (? When changed - by PCP? -Patient cannot remember exactly when she started her prescription for methimazole only mentioned to take it once a day...).  We stopped methimazole completely as her TSH was more than 10.  Unfortunately, she did not return for labs afterwards until 02/2019, when her TSH was suppressed completely.   She continues on beta-blocker: Toprol XL.  Pt denies: - feeling  nodules in neck - hoarseness - dysphagia - choking - SOB with lying down  No FH of thyroid disease. No FH of thyroid cancer. No h/o radiation tx to head or neck.  No herbal supplements. No Biotin use. No recent steroids use.   DM2: Reviewed HbA1c levels: Lab Results  Component Value Date   HGBA1C 8.8 (H) 02/26/2019   HGBA1C 13.0 04/26/2018   HGBA1C 14.1 (H) 03/19/2018   She is on: - Glipizide 10 mg 2x a day before meals - Levemir 40 >> 50 units at night  - Trulicity 1.5 mg weekly - started 12/2018 - took last dose today She was previously on a VGo patch pump.   She also tried Bydureon (02/2013-10/2014) >> made her hungry, did not lower sugars  At last visit she was not checking sugars.  Now checking once a day: - am: 99-138, 161 >> 94-128 >> ? >> 156-184 - 2h after b'fast: n/c - before lunch: 221 (cantaloupe) >> n/c - 2h after lunch: n/c >> 200 >> n/c >> 165-185 - before dinner: 119, 130-151 >> 120-125 >> ?  - 2h after dinner: n/c >> 189 >> n/c >> 175-201 - bedtime: 192, 265 >> 130-137 >> ? - nighttime: n/c Lowest sugar was 94 >> 135; she has hypoglycemia awareness in the 70s. Highest sugar was 298 >> 240  Glucometer: AccuChek  -+ CKD, last BUN/creatinine:  Lab Results  Component Value Date  BUN 19 02/26/2019   CREATININE 1.29 (H) 02/26/2019  04/25/2018: Glucose 269, BUN/creatinine 18/1.05 On olmesartan. -+ HL; last set of lipids: Lab Results  Component Value Date   CHOL 190 03/19/2018   HDL 46.00 03/19/2018   LDLCALC 55 09/01/2014   LDLDIRECT 98.0 03/19/2018   TRIG 217.0 (H) 03/19/2018   CHOLHDL 4 03/19/2018  On pravastatin. - last eye exam was in 10/2018: No DR -No numbness and tingling in her feet.   She previously had hypokalemia, which we addressed in the past.  This normalized. - Possibly previously from ACE inhibitor, potassium supplementation +/-beta-blocker - After an initial K of 7.5 in 07/2017 >> K levels normalized after stopping ACEI and K  supplements Lab Results  Component Value Date   K 4.9 02/26/2019   K 4.1 04/25/2018   K 3.9 03/19/2018   K 4.5 11/28/2017   K 4.5 11/01/2017  Of note, a cosyntropin stimulation test was normal and also aldosterone and renin were normal while in the hospital.   She had AKI on admission then.  Her son died in June 30, 2017-he had severe heart failure.  ROS: Constitutional: no weight gain/no weight loss, no fatigue, no subjective hyperthermia, no subjective hypothermia Eyes: no blurry vision, no xerophthalmia ENT: no sore throat, no nodules palpated in neck, no dysphagia, no odynophagia, no hoarseness Cardiovascular: no CP/no SOB/no palpitations/no leg swelling Respiratory: no cough/no SOB/no wheezing Gastrointestinal: no N/no V/no D/no C/no acid reflux Musculoskeletal: no muscle aches/+ joint aches (R Knee) Skin: no rashes, no hair loss Neurological: no tremors/no numbness/no tingling/no dizziness  I reviewed pt's medications, allergies, PMH, social hx, family hx, and changes were documented in the history of present illness. Otherwise, unchanged from my initial visit note.  Past Medical History:  Diagnosis Date  . Afib (HCC)   . Diabetes mellitus   . Graves disease   . Heart failure (HCC)   . Hypertension   . Hyperthyroidism    Past Surgical History:  Procedure Laterality Date  . BREAST SURGERY     Social History   Social History  . Marital Status: Married    Spouse Name: N/A  . Number of Children: 1   Occupational History  . 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 Smoking status: Never Smoker   . Smokeless tobacco: Not on file  . Alcohol Use: No  . Drug Use: No   Current Outpatient Medications on File Prior to Visit  Medication Sig Dispense Refill  . Abatacept (ORENCIA [place]) Inject into the skin every 30 (thirty) days.    Marland Kitchen amLODipine (NORVASC) 10 MG tablet Take 1 tablet (10 mg total) by mouth 2 (two) times daily. 180 tablet 1  . apixaban (ELIQUIS) 5 MG  TABS tablet Take 1 tablet (5 mg total) by mouth 2 (two) times daily. 180 tablet 3  . Apoaequorin (PREVAGEN PO) Take 1 capsule by mouth daily.    . colchicine 0.6 MG tablet TAKE 1 TABLET BY MOUTH DAILY AS NEEDED FOR PAIN 90 tablet 1  . esomeprazole (NEXIUM) 40 MG capsule Take 40 mg by mouth daily before breakfast.    . furosemide (LASIX) 20 MG tablet Take 2 tablets (40 mg total) by mouth 2 (two) times daily. 180 tablet 1  . gabapentin (NEURONTIN) 300 MG capsule TAKE 1 CAPSULE BY MOUTH THREE TIMES A DAY 270 capsule 1  . glipiZIDE (GLUCOTROL) 5 MG tablet TAKE 2 TABLETS (10 MG TOTAL) BY MOUTH 2 (TWO) TIMES DAILY BEFORE  A MEAL. 360 tablet 3  . isosorbide-hydrALAZINE (BIDIL) 20-37.5 MG tablet Take 1 tablet by mouth 3 (three) times daily. 90 tablet 2  . LEVEMIR FLEXTOUCH 100 UNIT/ML Pen INJECT 35 UNITS INTO THE SKIN DAILY AT 10 PM (Patient taking differently: 50 Units. ) 15 mL 1  . metoprolol succinate (TOPROL-XL) 100 MG 24 hr tablet Take 1 tablet (100 mg total) by mouth daily. Take with or immediately following a meal. 90 tablet 1  . pravastatin (PRAVACHOL) 40 MG tablet Take 1 tablet (40 mg total) by mouth daily at 6 PM. 90 tablet 1   No current facility-administered medications on file prior to visit.   Allergies  Allergen Reactions  . Tramadol Nausea And Vomiting    FH - see HPI  PE: BP (!) 150/80   Pulse 90   Ht 5' 3.78" (1.62 m) Comment: measured today without shoes  Wt 217 lb (98.4 kg)   SpO2 98%   BMI 37.51 kg/m  Wt Readings from Last 3 Encounters:  03/03/19 217 lb (98.4 kg)  02/26/19 212 lb 1.6 oz (96.2 kg)  08/22/18 226 lb (102.5 kg)   Constitutional: overweight, in NAD Eyes: PERRLA, EOMI, no exophthalmos ENT: moist mucous membranes, no thyromegaly, no cervical lymphadenopathy Cardiovascular: RRR, No MRG Respiratory: CTA B Gastrointestinal: abdomen soft, NT, ND, BS+ Musculoskeletal: no deformities, strength intact in all 4 Skin: moist, warm, no rashes Neurological: no  tremor with outstretched hands, DTR normal in all 4  ASSESSMENT: 1. 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  Graves' disease  3.  HL  We previously also advised her hyperkalemia: - in 07/2017, she had a critical value of potassium of 7.5.  She was admitted with high potassium since then >> Lasix, potassium supplementation, and lisinopril were stopped. - A cosyntropin stimulation test performed in the hospital was normal, ruling out primary adrenal insufficiency as a cause for hyperkalemia - Aldosterone and renin levels were normal during her last hospitalization - On her medication list, I do not see any possible culprits,   Other than possibly propranolol - I suggested a referral to nephrology  if her potassium continues to remain elevated after stopping ACE inhibitor and potassium supplementation - Latest potassium levels were reviewed and they normalized   PLAN:  1. Patient with longstanding, uncontrolled, type 2 diabetes, on basal insulin, sulfonylurea, and weekly GLP-1 receptor agonist.  She was previously on Victoza, but we were able to switch to Trulicity.  At last visit, we increased the dose of her Levemir as her HbA1c was very high.  I advised her to get in touch with me about her sugars, but she did not do so.  She returns after long absence of almost a year.  Latest HbA1c was 8.8%, improved, though, from 13% at last visit. -At this visit, sugars are still above target.  We will continue glipizide and Levemir and we discussed about increasing Trulicity dose.  She is tolerating it well. - I suggested to:  Patient Instructions  Please continue: - Glipizide 10 mg 2x a day before meals  - Levemir 50 units at night   Please increase: - Trulicity to 3 mg weekly  Please stop at the lab.  Please come back for labs in 1 month.  Come back for a follow up appt in 3 months.  - advised to check sugars at different times of the day - 1-2x a  day, rotating check times - advised for yearly eye  exams >> she is UTD - return to clinic in 3 months    2.  Graves' disease -Her hypothyroidism was diagnosed after she was admitted with A. fib with RVR and acute pulmonary edema in 04/2017 -She had elevated TSI antibodies, so most likely diagnosis was Graves' disease -She initially had symptoms of weight loss, diarrhea, but denied tremors, palpitations, anxiety, heat intolerance- -in the past she was on methimazole but a TSH returned high, above 10, so we stopped it completely -At last visit I advised her to return to the clinic for another recheck (this was a virtual visit), but she only returned on 02/2019 and at that time TSH was completely suppressed: Lab Results  Component Value Date   TSH <0.005 (L) 02/26/2019  -At this visit, we will likely need to restart methimazole at 5 mg twice a day but I would like to check her free T4 and free T3, also before deciding if this dose is correct or not -Afterwards, we will recheck her TFTs in 1 month - discussed about the importance of following up for labs and appointments  3. HL -Regulated lipid panel from a year ago: LDL above target, triglycerides high, Lab Results  Component Value Date   CHOL 190 03/19/2018   HDL 46.00 03/19/2018   LDLCALC 55 09/01/2014   LDLDIRECT 98.0 03/19/2018   TRIG 217.0 (H) 03/19/2018   CHOLHDL 4 03/19/2018  -Continues pravastatin without side effects  Component     Latest Ref Rng & Units 03/03/2019  TSH     0.35 - 4.50 uIU/mL <0.01 (L)  T4,Free(Direct)     0.60 - 1.60 ng/dL 2.70 (H)  Triiodothyronine,Free,Serum     2.3 - 4.2 pg/mL 4.4 (H)   TSH is undetectable, free T4 and free T3 are slightly above the upper limit of normal.  We will start 5 mg twice a day of methimazole and recheck her tests in 1 month.  Carlus Pavlov, MD PhD [facility]

## 2019-03-04 ENCOUNTER — Ambulatory Visit: Payer: Self-pay | Admitting: Internal Medicine

## 2019-03-05 ENCOUNTER — Telehealth: Payer: Self-pay

## 2019-03-05 NOTE — Telephone Encounter (Signed)
-----   Message from Carlus Pavlov, MD sent at 03/03/2019  5:16 PM EST ----- Efraim Kaufmann, can you please call pt: TSH is still low, and her free T4 and free T3 are slightly high.  Please restart 5 mg twice a day of methimazole and recheck her tests in 1 month.  I sent the prescription to her pharmacy.  Labs are in.

## 2019-03-05 NOTE — Telephone Encounter (Signed)
Called patient, phone rang several minutes without answer or VM

## 2019-03-07 ENCOUNTER — Ambulatory Visit (INDEPENDENT_AMBULATORY_CARE_PROVIDER_SITE_OTHER): Payer: Medicare Other | Admitting: Internal Medicine

## 2019-03-07 ENCOUNTER — Other Ambulatory Visit: Payer: Self-pay

## 2019-03-07 ENCOUNTER — Encounter: Payer: Self-pay | Admitting: Internal Medicine

## 2019-03-07 VITALS — BP 136/86 | HR 93 | Temp 98.2°F | Ht 63.78 in | Wt 214.0 lb

## 2019-03-07 DIAGNOSIS — J9612 Chronic respiratory failure with hypercapnia: Secondary | ICD-10-CM | POA: Diagnosis not present

## 2019-03-07 DIAGNOSIS — M25561 Pain in right knee: Secondary | ICD-10-CM | POA: Diagnosis not present

## 2019-03-07 DIAGNOSIS — G8929 Other chronic pain: Secondary | ICD-10-CM | POA: Diagnosis not present

## 2019-03-07 DIAGNOSIS — E0590 Thyrotoxicosis, unspecified without thyrotoxic crisis or storm: Secondary | ICD-10-CM

## 2019-03-07 MED ORDER — VOLTAREN 1 % EX GEL: 2.0000 g | Freq: Four times a day (QID) | CUTANEOUS | 3 refills | Status: AC

## 2019-03-07 NOTE — Progress Notes (Signed)
   Subjective:   Patient ID: Donna Booker, female    DOB: November 05, 1945, 74 y.o.   MRN: [id number]  HPI The patient is a 74 YO female coming in for follow up of her breathing (previously on breo with improvement of SOB, insurance will not cover well and this is too expensive, they are trying to tell her the closest thing is flonase nasal spray, she is more SOB without it for several weeks now) and thyroid (started methimazole again due to low TSH and mildly elevated T4, T3 levels with endo, she will need follow up with them again, denies problems with swallowing, weight stable overall) and concerns about left knee pain (worse lately, has RA but no clear flare lately, [facility] to [facility] and they gave her a cream which she does not think is helping well, wants to try brand name voltaren as she feels that this would work better).   Review of Systems  Constitutional: Positive for activity change.  HENT: Negative.   Eyes: Negative.   Respiratory: Positive for shortness of breath. Negative for cough and chest tightness.   Cardiovascular: Negative for chest pain, palpitations and leg swelling.  Gastrointestinal: Negative for abdominal distention, abdominal pain, constipation, diarrhea, nausea and vomiting.  Musculoskeletal: Positive for arthralgias and myalgias.  Skin: Negative.   Neurological: Negative.   Psychiatric/Behavioral: Negative.     Objective:  Physical Exam Constitutional:      Appearance: She is well-developed. She is obese.  HENT:     Head: Normocephalic and atraumatic.  Cardiovascular:     Rate and Rhythm: Normal rate and regular rhythm.  Pulmonary:     Effort: Pulmonary effort is normal. No respiratory distress.     Breath sounds: Normal breath sounds. No wheezing or rales.  Abdominal:     General: Bowel sounds are normal. There is no distension.     Palpations: Abdomen is soft.     Tenderness: There is no abdominal tenderness. There is no rebound.  Musculoskeletal:      General: Tenderness present.     Cervical back: Normal range of motion.  Skin:    General: Skin is warm and dry.  Neurological:     Mental Status: She is alert and oriented to person, place, and time.     Coordination: Coordination normal.     Vitals:   03/07/19 1055  BP: 136/86  Pulse: 93  Temp: 98.2 F (36.8 C)  TempSrc: Oral  SpO2: 99%  Weight: 214 lb (97.1 kg)  Height: 5' 3.78" (1.62 m)    This visit occurred during the SARS-CoV-2 public health emergency.  Safety protocols were in place, including screening questions prior to the visit, additional usage of staff PPE, and extensive cleaning of exam room while observing appropriate contact time as indicated for disinfecting solutions.   Assessment & Plan:

## 2019-03-07 NOTE — Patient Instructions (Addendum)
Instead of the breo we can try: advair, xihela, symbicort, dulera.

## 2019-03-07 NOTE — Assessment & Plan Note (Signed)
Rx for DAW voltaren gel to see if this helps more than the generic that [facility] has prescribed.

## 2019-03-07 NOTE — Assessment & Plan Note (Signed)
Taking methimazole and encouraged follow up with endo as advised.

## 2019-03-07 NOTE — Assessment & Plan Note (Signed)
Previously on breo but she cannot afford, given advair, xihela, dulera, symbicort as alternatives and she will call insurance to find out which is affordable and let us know and we will prescribe.

## 2019-03-12 NOTE — Telephone Encounter (Signed)
Left message for patient to return our call at [phone].  

## 2019-03-13 ENCOUNTER — Other Ambulatory Visit: Payer: Self-pay | Admitting: Cardiology

## 2019-03-13 DIAGNOSIS — I10 Essential (primary) hypertension: Secondary | ICD-10-CM

## 2019-03-13 MED ORDER — AMLODIPINE BESYLATE 10 MG PO TABS: 10.0000 mg | ORAL_TABLET | Freq: Every day | ORAL | 1 refills | Status: DC

## 2019-03-14 ENCOUNTER — Telehealth: Payer: Self-pay | Admitting: Pharmacist

## 2019-03-14 NOTE — Telephone Encounter (Signed)
Completed med rec together, updated pt's medication list. Enrolled pt in RPM/PCM for BP monitoring. Will continue to monitor and follow up with pt and provider.

## 2019-03-17 ENCOUNTER — Telehealth: Payer: Self-pay

## 2019-03-17 NOTE — Telephone Encounter (Signed)
Telephone encounter:  Reason for call: pt cannot afford Bidil. Its cheaper for her to get the medicine separate . The Isosorbide and the hydralazine.   Usual provider: MP  Last office visit: 02/26/19   Next office visit:03/26/19   Last hospitalization: na   Current Outpatient Medications on File Prior to Visit  Medication Sig Dispense Refill  . Abatacept (ORENCIA [place]) Inject into the skin every 30 (thirty) days.    Marland Kitchen amLODipine (NORVASC) 10 MG tablet Take 1 tablet (10 mg total) by mouth daily. 90 tablet 1  . apixaban (ELIQUIS) 5 MG TABS tablet Take 1 tablet (5 mg total) by mouth 2 (two) times daily. 180 tablet 3  . Apoaequorin (PREVAGEN PO) Take 1 capsule by mouth daily.    . colchicine 0.6 MG tablet TAKE 1 TABLET BY MOUTH DAILY AS NEEDED FOR PAIN 90 tablet 1  . Dulaglutide (TRULICITY) 3 MG/0.5ML SOPN Inject 3 mg into the skin once a week. 4 pen 11  . esomeprazole (NEXIUM) 40 MG capsule Take 40 mg by mouth daily before breakfast.    . furosemide (LASIX) 20 MG tablet Take 2 tablets (40 mg total) by mouth 2 (two) times daily. (Patient taking differently: Take 40 mg by mouth daily. ) 180 tablet 1  . gabapentin (NEURONTIN) 300 MG capsule TAKE 1 CAPSULE BY MOUTH THREE TIMES A DAY 270 capsule 1  . glipiZIDE (GLUCOTROL) 5 MG tablet TAKE 2 TABLETS (10 MG TOTAL) BY MOUTH 2 (TWO) TIMES DAILY BEFORE A MEAL. 360 tablet 3  . isosorbide-hydrALAZINE (BIDIL) 20-37.5 MG tablet Take 1 tablet by mouth 3 (three) times daily. 90 tablet 2  . LEVEMIR FLEXTOUCH 100 UNIT/ML Pen INJECT 35 UNITS INTO THE SKIN DAILY AT 10 PM (Patient taking differently: 50 Units. ) 15 mL 1  . methimazole (TAPAZOLE) 5 MG tablet Take 1 tablet (5 mg total) by mouth 2 (two) times daily. 120 tablet 5  . metoprolol succinate (TOPROL-XL) 100 MG 24 hr tablet Take 1 tablet (100 mg total) by mouth daily. Take with or immediately following a meal. 90 tablet 1  . pravastatin (PRAVACHOL) 40 MG tablet Take 1 tablet (40 mg total) by mouth daily at 6  PM. 90 tablet 1  . VOLTAREN 1 % GEL Apply 2 g topically 4 (four) times daily. 100 g 3   No current facility-administered medications on file prior to visit.

## 2019-03-17 NOTE — Telephone Encounter (Signed)
Please send separate hydralazine nitrate and isosorbide dinitrate prescriptions.  Thanks MJP

## 2019-03-18 ENCOUNTER — Other Ambulatory Visit: Payer: Self-pay | Admitting: Cardiology

## 2019-03-18 ENCOUNTER — Encounter: Payer: Self-pay | Admitting: Internal Medicine

## 2019-03-18 ENCOUNTER — Other Ambulatory Visit: Payer: Self-pay

## 2019-03-18 ENCOUNTER — Ambulatory Visit (INDEPENDENT_AMBULATORY_CARE_PROVIDER_SITE_OTHER): Payer: Medicare Other | Admitting: Internal Medicine

## 2019-03-18 VITALS — BP 202/98 | HR 89 | Temp 98.7°F | Ht 60.38 in | Wt 213.1 lb

## 2019-03-18 DIAGNOSIS — M25562 Pain in left knee: Secondary | ICD-10-CM | POA: Diagnosis not present

## 2019-03-18 DIAGNOSIS — E118 Type 2 diabetes mellitus with unspecified complications: Secondary | ICD-10-CM | POA: Diagnosis not present

## 2019-03-18 DIAGNOSIS — I10 Essential (primary) hypertension: Secondary | ICD-10-CM

## 2019-03-18 MED ORDER — ISOSORBIDE DINITRATE 20 MG PO TABS: 20.0000 mg | ORAL_TABLET | Freq: Three times a day (TID) | ORAL | 3 refills | Status: DC

## 2019-03-18 MED ORDER — LIDOCAINE 5 % EX PTCH: 1.0000 | MEDICATED_PATCH | CUTANEOUS | 0 refills | Status: DC

## 2019-03-18 MED ORDER — HYDRALAZINE HCL 25 MG PO TABS: 25.0000 mg | ORAL_TABLET | Freq: Three times a day (TID) | ORAL | 3 refills | Status: DC

## 2019-03-18 MED ORDER — GABAPENTIN 300 MG PO CAPS: 300.0000 mg | ORAL_CAPSULE | Freq: Three times a day (TID) | ORAL | 0 refills | Status: DC

## 2019-03-18 NOTE — Assessment & Plan Note (Signed)
Refer to sports medicine for evaluation. Rx lidoderm patch in the meantime. Continue to use voltaren gel if helpful. Can continue with tylenol which provides some relief.

## 2019-03-18 NOTE — Progress Notes (Signed)
   Subjective:   Patient ID: Donna Booker, female    DOB: [birth year], 74 y.o.   MRN: [id number]  HPI The patient is a 74 YO female coming in for concerns about left knee pain which goes down to the calf. Seen about 1 week ago for same and given DAW voltaren gel. She was unable to get this from pharmacy and already tried generic without relief. Has RA but this is not clear flare. Taking orencia for the RA. Denies fall prior to onset and went to urgent care initially for this. Had x-ray there without any findings that she was told about (records not available for review). She would like to get some relief from this. Denies falls or instability of the knee. Prior fracture to the knee many years ago. Has had scope to both knees in the past.   Review of Systems  Constitutional: Negative.   HENT: Negative.   Eyes: Negative.   Respiratory: Negative for cough, chest tightness and shortness of breath.   Cardiovascular: Negative for chest pain, palpitations and leg swelling.  Gastrointestinal: Negative for abdominal distention, abdominal pain, constipation, diarrhea, nausea and vomiting.  Musculoskeletal: Positive for arthralgias and myalgias.  Skin: Negative.   Neurological: Negative.   Psychiatric/Behavioral: Negative.     Objective:  Physical Exam Constitutional:      Appearance: She is well-developed.  HENT:     Head: Normocephalic and atraumatic.  Cardiovascular:     Rate and Rhythm: Normal rate and regular rhythm.  Pulmonary:     Effort: Pulmonary effort is normal. No respiratory distress.     Breath sounds: Normal breath sounds. No wheezing or rales.  Abdominal:     General: Bowel sounds are normal. There is no distension.     Palpations: Abdomen is soft.     Tenderness: There is no abdominal tenderness. There is no rebound.  Musculoskeletal:        General: Tenderness present.     Cervical back: Normal range of motion.     Comments: Tenderness medial aspect of the left knee to  palpation, left calf without tenderness to palpation or swelling.  Skin:    General: Skin is warm and dry.  Neurological:     Mental Status: She is alert and oriented to person, place, and time.     Coordination: Coordination normal.     Vitals:   03/18/19 1035  BP: (!) 202/98  Pulse: 89  Temp: 98.7 F (37.1 C)  TempSrc: Oral  SpO2: 98%  Weight: 213 lb 2 oz (96.7 kg)  Height: 5' 0.38" (1.534 m)    This visit occurred during the SARS-CoV-2 public health emergency.  Safety protocols were in place, including screening questions prior to the visit, additional usage of staff PPE, and extensive cleaning of exam room while observing appropriate contact time as indicated for disinfecting solutions.   Assessment & Plan:

## 2019-03-18 NOTE — Telephone Encounter (Signed)
Yes please. Tid. Please send 90 pills and 3 refills.  Thanks MJP

## 2019-03-18 NOTE — Patient Instructions (Addendum)
We have sent in the lidocaine patches to use for the leg pain.  We have sent in a higher amount of the gabapentin so we can try increasing the dose.   For 1 week increase to 2 pills in the morning, 1 pill afternoon, 1 pill evening.  If still having more pain after the first week, then increase for 1 week to 2 pills in the morning, 2 pills in the afternoon, 1 pill in the evening.   If still having more pain after second week, increase to 2 pills in the morning, 2 pills in the afternoon, 2 pills in the evening.   If still not controlled call us.  We will get you in with sports medicine for the left leg.

## 2019-03-18 NOTE — Assessment & Plan Note (Signed)
Increasing gabapentin for neuropathy today over the next 3 weeks to 600 mg TID if needed. Can change dose on refill to 600 mg capsules TID if change is successful.

## 2019-03-19 NOTE — Telephone Encounter (Signed)
Please read comments from pharmacy for refill.

## 2019-03-19 NOTE — Telephone Encounter (Signed)
Will address at upcoming office visit on 3/24. Do not refill for now.

## 2019-03-20 ENCOUNTER — Ambulatory Visit (INDEPENDENT_AMBULATORY_CARE_PROVIDER_SITE_OTHER): Payer: Medicare Other

## 2019-03-20 ENCOUNTER — Ambulatory Visit (INDEPENDENT_AMBULATORY_CARE_PROVIDER_SITE_OTHER): Payer: Medicare Other | Admitting: Family Medicine

## 2019-03-20 ENCOUNTER — Encounter: Payer: Self-pay | Admitting: Family Medicine

## 2019-03-20 ENCOUNTER — Other Ambulatory Visit: Payer: Self-pay

## 2019-03-20 VITALS — BP 134/70 | HR 94 | Ht 60.38 in | Wt 212.0 lb

## 2019-03-20 DIAGNOSIS — M10372 Gout due to renal impairment, left ankle and foot: Secondary | ICD-10-CM

## 2019-03-20 DIAGNOSIS — M25562 Pain in left knee: Secondary | ICD-10-CM | POA: Diagnosis not present

## 2019-03-20 NOTE — Progress Notes (Signed)
Subjective:    CC: L knee and calf pain  I, Debbe Odea, am serving as a Neurosurgeon for Dr. Clementeen Graham.  HPI: Pt is a 74 y/o female presenting w/ c/o L knee and L calf pain X47month w/ no known MOI.  He rates his pain at a 6/10 normally and a 9/10 during the episodes and describes his pain as tender and sometimes stabbing and shooting. Hx Gout. Has not used colchicine for knee.   Patient was seen in urgent care.  X-ray of left knee were done and reportedly negative however results were not available at the time of the visit.  [facility] [address] [place] [address] Appointments: [phone] Office: [phone] Fax [phone]  Radiating pain: yes, into his L calf L knee swelling: in the beginning not now L knee mechanical symptoms: no Aggravating factors: sitting for too long and having to get up Treatments tried: Voltaren gel; lidoderm patch  Pertinent review of Systems: No fevers or chills  Relevant historical information: History gout, diabetes, left knee injury in the remote past with surgery.  History of rheumatoid arthritis.   Objective:    Vitals:   03/20/19 1049  BP: 134/70  Pulse: 94  SpO2: 99%   General: Well Developed, well nourished, and in no acute distress.   MSK: Left knee: Moderate effusion otherwise normal-appearing Range of motion 0-100 degrees with crepitation. Tender palpation medial joint line.  Mildly swollen posterior medial knee. Stable ligamentous exam however patient guards a bit with exam testing. Guarding with McMurray's test nondiagnostic. Intact strength to flexion and extension.  Lab and Radiology Results  Diagnostic Limited MSK Ultrasound of: Left knee Quad tendon intact.  Moderate joint effusion present suprapatellar space. Patellar tendon intact.  Small amount of fluid tracking deep to patellar tendon at insertion onto tibia. Lateral joint line slightly narrowed lateral meniscus present  without obvious tear. Medial joint line very narrowed medial meniscus missing for portion of the medial joint line. Posterior medial knee shows a large Baker's cyst. Impression: Joint effusion and Baker's cyst.  Medial compartment DJD and absent medial meniscus  Procedure: Real-time Ultrasound Guided Injection of left knee superior patellar space Device: Philips Affiniti 50G Images permanently stored and available for review in the ultrasound unit. Verbal informed consent obtained.  Discussed risks and benefits of procedure. Warned about infection bleeding damage to structures skin hypopigmentation and fat atrophy among others. Patient expresses understanding and agreement Time-out conducted.   Noted no overlying erythema, induration, or other signs of local infection.   Skin prepped in a sterile fashion.   Local anesthesia: Topical Ethyl chloride.   With sterile technique and under real time ultrasound guidance:  40 mg of Kenalog and 3 mL of Marcaine injected easily.   Completed without difficulty   Pain immediately resolved suggesting accurate placement of the medication.   Advised to call if fevers/chills, erythema, induration, drainage, or persistent bleeding.   Images permanently stored and available for review in the ultrasound unit.  Impression: Technically successful ultrasound guided injection.       Impression and Recommendations:    Assessment and Plan: 74 y.o. female with left knee pain ongoing for about 2 weeks without obvious cause.  Exam finding and ultrasound exam findings show DJD and a Baker's cyst as well as knee effusion.  She does have a history of gout.  Differential causing pain and effusion as well as Baker's cyst is exacerbation of DJD, meniscus  tear, gout exacerbation or rheumatoid arthritis exacerbation. Patient is already had some limited conservative management with little benefit. Plan: Cortisone injection knee joint.  This should help the effusion as well  as probably helping the Baker's cyst.  We will also recommend patient start taking colchicine daily for about 2 weeks. Recheck in 3 weeks if not improved would consider aspiration of Baker's cyst and injection. Also will request medical records.   Orders Placed This Encounter  Procedures  . [place] LIMITED JOINT SPACE STRUCTURES LOW LEFT    Standing Status:   Future    Number of Occurrences:   1    Standing Expiration Date:   05/19/2020    Order Specific Question:   Reason for Exam (SYMPTOM  OR DIAGNOSIS REQUIRED)    Answer:   Left knee pain    Order Specific Question:   Preferred imaging location?    Answer:   [place]  . Uric acid    Standing Status:   Future    Standing Expiration Date:   03/19/2020   No orders of the defined types were placed in this encounter.   Discussed warning signs or symptoms. Please see discharge instructions. Patient expresses understanding.   The above documentation has been reviewed and is accurate and complete Lynne Leader

## 2019-03-20 NOTE — Patient Instructions (Addendum)
Thank you for coming in today. Take colchicine daily for 1-2 weeks.  Call or go to the ER if you develop a large red swollen joint with extreme pain or oozing puss.  Recheck with me in 3 weeks or so.  Get the lab I ordered (uric acid) with the next time you get your blood drawn.   Return sooner if needed.    Baker Cyst  A Baker cyst, also called a popliteal cyst, is a growth that forms at the back of the knee. The cyst forms when the fluid-filled sac (bursa) that cushions the knee joint becomes enlarged. What are the causes? In most cases, a Baker cyst results from another knee problem that causes swelling inside the knee. This makes the fluid inside the knee joint (synovial fluid) flow into the bursa behind the knee, causing the bursa to enlarge. What increases the risk? You may be more likely to develop a Baker cyst if you already have a knee problem, such as:  A tear in cartilage that cushions the knee joint (meniscal tear).  A tear in the tissues that connect the bones of the knee joint (ligament tear).  Knee swelling from osteoarthritis, rheumatoid arthritis, or gout. What are the signs or symptoms? The main symptom of this condition is a lump behind the knee. This may be the only symptom of the condition. The lump may be painful, especially when the knee is straightened. If the lump is painful, the pain may come and go. The knee may also be stiff. Symptoms may quickly get more severe if the cyst breaks open (ruptures). If the cyst ruptures, you may feel the following in your knee and calf:  Sudden or worsening pain.  Swelling.  Bruising.  Redness in the calf. A Baker cyst does not always cause symptoms. How is this diagnosed? This condition may be diagnosed based on your symptoms and medical history. Your health care provider will also do a physical exam. This may include:  Feeling the cyst to check whether it is tender.  Checking your knee for signs of another knee  condition that causes swelling. You may have imaging tests, such as:  X-rays.  MRI.  Ultrasound. How is this treated? A Baker cyst that is not painful may go away without treatment. If the cyst gets large or painful, it will likely get better if the underlying knee problem is treated. If needed, treatment for a Baker cyst may include:  Resting.  Keeping weight off of the knee. This means not leaning on the knee to support your body weight.  Taking NSAIDs, such as ibuprofen, to reduce pain and swelling.  Having a procedure to drain the fluid from the cyst with a needle (aspiration). You may also get an injection of a medicine that reduces swelling (steroid).  Having surgery. This may be needed if other treatments do not work. This usually involves correcting knee damage and removing the cyst. Follow these instructions at home:  Activity  Rest as told by your health care provider.  Avoid activities that make pain or swelling worse.  Return to your normal activities as told by your health care provider. Ask your health care provider what activities are safe for you.  Do not use the injured limb to support your body weight until your health care provider says that you can. Use crutches as told by your health care provider. General instructions  Take over-the-counter and prescription medicines only as told by your health care provider.  Keep all follow-up visits as told by your health care provider. This is important. Contact a health care provider if:  You have knee pain, stiffness, or swelling that does not get better. Get help right away if:  You have sudden or worsening pain and swelling in your calf area. Summary  A Baker cyst, also called a popliteal cyst, is a growth that forms at the back of the knee.  In most cases, a Baker cyst results from another knee problem that causes swelling inside the knee.  A Baker cyst that is not painful may go away without  treatment.  If needed, treatment for a Baker cyst may include resting, keeping weight off of the knee, medicines, or draining fluid from the cyst.  Surgery may be needed if other treatments are not effective. This information is not intended to replace advice given to you by your health care provider. Make sure you discuss any questions you have with your health care provider. Document Revised: 05/03/2018 Document Reviewed: 05/03/2018 Elsevier Patient Education  2020 [employer].

## 2019-03-26 ENCOUNTER — Other Ambulatory Visit: Payer: Self-pay

## 2019-03-26 ENCOUNTER — Telehealth: Payer: Medicare Other | Admitting: Cardiology

## 2019-03-26 VITALS — BP 161/87 | HR 85

## 2019-03-26 DIAGNOSIS — E118 Type 2 diabetes mellitus with unspecified complications: Secondary | ICD-10-CM

## 2019-03-26 DIAGNOSIS — Z8639 Personal history of other endocrine, nutritional and metabolic disease: Secondary | ICD-10-CM

## 2019-03-26 DIAGNOSIS — I10 Essential (primary) hypertension: Secondary | ICD-10-CM

## 2019-03-26 DIAGNOSIS — E782 Mixed hyperlipidemia: Secondary | ICD-10-CM

## 2019-03-26 DIAGNOSIS — I480 Paroxysmal atrial fibrillation: Secondary | ICD-10-CM

## 2019-03-26 NOTE — Progress Notes (Signed)
Follow up visit  Subjective:   Donna Booker, female    DOB: Oct 22, 1945, 74 y.o.   MRN: [id number]  I connected with the patient on 03/26/2019 by a telephone call and verified that I am speaking with the correct person using two identifiers.     I offered the patient a video enabled application for a virtual visit. Unfortunately, this could not be accomplished due to technical difficulties/lack of video enabled phone/computer. I discussed the limitations of evaluation and management by telemedicine and the availability of in person appointments. The patient expressed understanding and agreed to proceed.   This visit type was conducted due to national recommendations for restrictions regarding the COVID-19 Pandemic (e.g. social distancing).  This format is felt to be most appropriate for this patient at this time.  All issues noted in this document were discussed and addressed.  No physical exam was performed (except for noted visual exam findings with Tele health visits).  The patient has consented to conduct a Tele health visit and understands insurance will be billed.    Chief Complaint  Patient presents with  . Hypertension     HPI  74 year old African American female withf Graves' disease with hyperthyroidism, paroxysmal atrial fibrillation, mild PH, HFpEF (secondary to Afib), uncontrolled type II diabetes mellitus with diabetic polyneuropathy, CKD stage III.  Since her last visit with me, she saw endocrinologist Dr. Cruzita Lederer. She was started back on methimazole given elevated free T4. Trulicity dose was increased given A1C of 8.8%. Patient is enrolled with remote patient monitoring through our office for hypertension management.   Remote monitoring shows limited compliance, but generally improved blood pressure control, ranging in <140s/80s, although higher this morning. She has not taken . She is tolerating current antihypertensive therapy well without any significant side effects.  She was recommended Bidil, but she could not afford it. She is only taking hydralazine 25 mg tid, as she cannot afford Isordil. She is also having cost issues with Eliquis. She is currently on pravastatin.    Current Outpatient Medications on File Prior to Visit  Medication Sig Dispense Refill  . Abatacept (ORENCIA [place]) Inject into the skin every 30 (thirty) days.    Marland Kitchen amLODipine (NORVASC) 10 MG tablet Take 1 tablet (10 mg total) by mouth daily. 90 tablet 1  . apixaban (ELIQUIS) 5 MG TABS tablet Take 1 tablet (5 mg total) by mouth 2 (two) times daily. 180 tablet 3  . Apoaequorin (PREVAGEN PO) Take 1 capsule by mouth daily.    . colchicine 0.6 MG tablet TAKE 1 TABLET BY MOUTH DAILY AS NEEDED FOR PAIN 90 tablet 1  . Dulaglutide (TRULICITY) 3 KA/7.6OT SOPN Inject 3 mg into the skin once a week. 4 pen 11  . esomeprazole (NEXIUM) 40 MG capsule Take 40 mg by mouth daily before breakfast.    . furosemide (LASIX) 20 MG tablet Take 2 tablets (40 mg total) by mouth 2 (two) times daily. (Patient taking differently: Take 40 mg by mouth daily. ) 180 tablet 1  . gabapentin (NEURONTIN) 300 MG capsule Take 1-2 capsules (300-600 mg total) by mouth 3 (three) times daily. 180 capsule 0  . glipiZIDE (GLUCOTROL) 5 MG tablet TAKE 2 TABLETS (10 MG TOTAL) BY MOUTH 2 (TWO) TIMES DAILY BEFORE A MEAL. 360 tablet 3  . hydrALAZINE (APRESOLINE) 25 MG tablet Take 1 tablet (25 mg total) by mouth 3 (three) times daily. 90 tablet 3  . isosorbide dinitrate (ISORDIL) 20 MG tablet Take 1 tablet (20  mg total) by mouth 3 (three) times daily. (Patient not taking: Reported on 03/25/2019) 90 tablet 3  . LEVEMIR FLEXTOUCH 100 UNIT/ML Pen INJECT 35 UNITS INTO THE SKIN DAILY AT 10 PM (Patient taking differently: 50 Units. ) 15 mL 1  . lidocaine (LIDODERM) 5 % Place 1 patch onto the skin daily. Remove & Discard patch within 12 hours or as directed by MD 30 patch 0  . methimazole (TAPAZOLE) 5 MG tablet Take 1 tablet (5 mg total) by mouth 2 (two)  times daily. 120 tablet 5  . metoprolol succinate (TOPROL-XL) 100 MG 24 hr tablet Take 1 tablet (100 mg total) by mouth daily. Take with or immediately following a meal. 90 tablet 1  . pravastatin (PRAVACHOL) 40 MG tablet Take 1 tablet (40 mg total) by mouth daily at 6 PM. 90 tablet 1  . VOLTAREN 1 % GEL Apply 2 g topically 4 (four) times daily. 100 g 3   No current facility-administered medications on file prior to visit.    Cardiovascular studies:  EKG 02/26/2019: Sinus rhythm 88 vpm.  Borderline left atrial enlargement.  Cannot exclude old anteroseptal infarct.  EKG 08/22/2018: Sinus rhythm 85 bpm.  Boderline LVH. Early repolarization, anteroseptal leads.   Echocardiogram 07/26/2017: Left ventricle cavity is normal in size. Moderate concentric hypertrophy of the left ventricle. Normal global wall motion. Doppler evidence of grade I (impaired) diastolic dysfunction, elevated LAP. Calculated EF 64%. Left atrial cavity is moderately dilated at 4.7 cm. Mild calcification of the aortic valve annulus. Trileaflet aortic valve with no regurgitation noted. Mild tricuspid regurgitation. Mild pulmonary hypertension. IVC is normal with respiratory variation. Compared to the hospital echocardiogram 04/16/2017, moderate RA enlargement not present  and moderate pulmonary hypertension is now mild.  Renal artery duplex ([facility]) 05/23/2017: No renal artery stenosis  Recent labs: 03/03/2019: TSH <0.01 very low. Free T4  1.98 elevated.  02/26/2019: Glucose 230, BUN/Cr 19/1.29. EGFR 48. Na/K 141/4.9. AlKP 118 mildly elevated. Rest of the CMP normal H/H 12/39. MCV 75. Platelets 379 HbA1C 8.8%  04/25/2018: Glucose 269, BUN/Cr 18/1.1. EGFR 58. Na/K 142/4.1. Rest of the CMP normal H/H 12/37. MCV 75. Platelets 306 HbA1C 13% Chol 190, TG 217, HDL 46, LDL 98   Review of Systems  Cardiovascular: Negative for chest pain, dyspnea on exertion, leg swelling, palpitations and syncope.          Vitals:   03/26/19 0929  BP: (!) 161/87  Pulse: 85     Objective:   Physical Exam  Not preformed. Telephone visit.      Assessment & Recommendations:   74 year old African American female withf Graves' disease with hyperthyroidism, paroxysmal atrial fibrillation, mild PH, HFpEF (secondary to Afib), uncontrolled type II diabetes mellitus with diabetic polyneuropathy, CKD stage III  PAF: Maintaining sinus rhythm. CHA2DS2VASc score 4, annual stroke risk 5%. Continue anticaogualtion with eliquis 5 mg bid. Will work on patient assistance and provide samples.  Hypertension: Driven by hyperthyroidism. Continue metoprolol succinate 100 mg daily, amlodipine 10 mg daily, hydralazine 25 mg tid.  No change made today. Will continue remote patient monitoring. If SBP remains >140/80 mmHg, will increase hydralazine to 50 mg tid.  She should not be on ACEi/ARB/aldosterone antagonist due to pevious episodes of hyperkalemia.  Grave's disease, DM:  Management as per endocrinology.  Hyperlipidemia: Switch pravastatin 40 mg to rosuvastatin 10 mg daily. Check lipid panel in 3 months  F/u in 3 months.  Nigel Mormon, MD [facility] Pager: [phone] Office: [phone] If  no answer Cell [phone]

## 2019-03-26 NOTE — Telephone Encounter (Signed)
Okay to refill. She is tolerating it well.

## 2019-03-27 ENCOUNTER — Telehealth: Payer: Self-pay

## 2019-03-27 DIAGNOSIS — E782 Mixed hyperlipidemia: Secondary | ICD-10-CM

## 2019-03-27 MED ORDER — ROSUVASTATIN CALCIUM 10 MG PO TABS: 10.0000 mg | ORAL_TABLET | Freq: Every day | ORAL | 3 refills | Status: DC

## 2019-03-27 NOTE — Telephone Encounter (Signed)
Called pt regarding missing BP readings. Requested pt to check to check BP together. Pt realized that she has been pressing the "2" button, instead of "1" button. BP readings reviewed. BP near goal. Will continue to monitor and follow up. Pt requested samples for Eliquis and wanted to follow up on Crestor that was discussed during last OV w/ Dr. Rosemary Holms. Will have the Eliquis sample ready for pt tomorrow. Send in a new Rx for Crestor 10 mg as discussed during the OV to pt's pharmacy. Will continue to monitor and follow up regarding pt's BP management.

## 2019-03-27 NOTE — Telephone Encounter (Signed)
This encounter was created in error - please disregard.

## 2019-03-27 NOTE — Addendum Note (Signed)
Addended by: Andrew Au on: 03/27/2019 04:48 PM   Modules accepted: Orders

## 2019-03-27 NOTE — Telephone Encounter (Signed)
Hyperlipidemia: Switch pravastatin 40 mg to rosuvastatin 10 mg daily. Check lipid panel in 3 months  Did you want the patient to begin the rosuvastatin

## 2019-03-31 ENCOUNTER — Other Ambulatory Visit: Payer: Self-pay | Admitting: Cardiology

## 2019-04-03 ENCOUNTER — Other Ambulatory Visit: Payer: Medicare Other

## 2019-04-07 ENCOUNTER — Ambulatory Visit: Payer: Self-pay | Admitting: Internal Medicine

## 2019-04-10 ENCOUNTER — Encounter: Payer: Self-pay | Admitting: Family Medicine

## 2019-04-10 ENCOUNTER — Ambulatory Visit (INDEPENDENT_AMBULATORY_CARE_PROVIDER_SITE_OTHER): Payer: Medicare Other | Admitting: Family Medicine

## 2019-04-10 ENCOUNTER — Other Ambulatory Visit: Payer: Self-pay

## 2019-04-10 VITALS — BP 130/70 | HR 90 | Ht 60.0 in | Wt 211.0 lb

## 2019-04-10 DIAGNOSIS — M25562 Pain in left knee: Secondary | ICD-10-CM | POA: Diagnosis not present

## 2019-04-10 NOTE — Patient Instructions (Signed)
Thank you for coming in today. Get your blood drawn soon.  Make sure they check Uric Acid For me.  I may start allopurinol to lower uric acid and treat gout.   Keep me updated and recheck with me as needed.    Gout  Gout is painful swelling of your joints. Gout is a type of arthritis. It is caused by having too much uric acid in your body. Uric acid is a chemical that is made when your body breaks down substances called purines. If your body has too much uric acid, sharp crystals can form and build up in your joints. This causes pain and swelling. Gout attacks can happen quickly and be very painful (acute gout). Over time, the attacks can affect more joints and happen more often (chronic gout). What are the causes?  Too much uric acid in your blood. This can happen because: ? Your kidneys do not remove enough uric acid from your blood. ? Your body makes too much uric acid. ? You eat too many foods that are high in purines. These foods include organ meats, some seafood, and beer.  Trauma or stress. What increases the risk?  Having a family history of gout.  Being female and middle-aged.  Being female and having gone through menopause.  Being very overweight (obese).  Drinking alcohol, especially beer.  Not having enough water in the body (being dehydrated).  Losing weight too quickly.  Having an organ transplant.  Having lead poisoning.  Taking certain medicines.  Having kidney disease.  Having a skin condition called psoriasis. What are the signs or symptoms? An attack of acute gout usually happens in just one joint. The most common place is the big toe. Attacks often start at night. Other joints that may be affected include joints of the feet, ankle, knee, fingers, wrist, or elbow. Symptoms of an attack may include:  Very bad pain.  Warmth.  Swelling.  Stiffness.  Shiny, red, or purple skin.  Tenderness. The affected joint may be very painful to  touch.  Chills and fever. Chronic gout may cause symptoms more often. More joints may be involved. You may also have white or yellow lumps (tophi) on your hands or feet or in other areas near your joints. How is this treated?  Treatment for this condition has two phases: treating an acute attack and preventing future attacks.  Acute gout treatment may include: ? NSAIDs. ? Steroids. These are taken by mouth or injected into a joint. ? Colchicine. This medicine relieves pain and swelling. It can be given by mouth or through an IV tube.  Preventive treatment may include: ? Taking small doses of NSAIDs or colchicine daily. ? Using a medicine that reduces uric acid levels in your blood. ? Making changes to your diet. You may need to see a food expert (dietitian) about what to eat and drink to prevent gout. Follow these instructions at home: During a gout attack   If told, put ice on the painful area: ? Put ice in a plastic bag. ? Place a towel between your skin and the bag. ? Leave the ice on for 20 minutes, 2-3 times a day.  Raise (elevate) the painful joint above the level of your heart as often as you can.  Rest the joint as much as possible. If the joint is in your leg, you may be given crutches.  Follow instructions from your doctor about what you cannot eat or drink. Avoiding future gout attacks  Eat a low-purine diet. Avoid foods and drinks such as: ? Liver. ? Kidney. ? Anchovies. ? Asparagus. ? Herring. ? Mushrooms. ? Mussels. ? Beer.  Stay at a healthy weight. If you want to lose weight, talk with your doctor. Do not lose weight too fast.  Start or continue an exercise plan as told by your doctor. Eating and drinking  Drink enough fluids to keep your pee (urine) pale yellow.  If you drink alcohol: ? Limit how much you use to:  0-1 drink a day for women.  0-2 drinks a day for men. ? Be aware of how much alcohol is in your drink. In the [place] one drink equals  one 12 oz bottle of beer (355 mL), one 5 oz glass of wine (148 mL), or one 1 oz glass of hard liquor (44 mL). General instructions  Take over-the-counter and prescription medicines only as told by your doctor.  Do not drive or use heavy machinery while taking prescription pain medicine.  Return to your normal activities as told by your doctor. Ask your doctor what activities are safe for you.  Keep all follow-up visits as told by your doctor. This is important. Contact a doctor if:  You have another gout attack.  You still have symptoms of a gout attack after 10 days of treatment.  You have problems (side effects) because of your medicines.  You have chills or a fever.  You have burning pain when you pee (urinate).  You have pain in your lower back or belly. Get help right away if:  You have very bad pain.  Your pain cannot be controlled.  You cannot pee. Summary  Gout is painful swelling of the joints.  The most common site of pain is the big toe, but it can affect other joints.  Medicines and avoiding some foods can help to prevent and treat gout attacks. This information is not intended to replace advice given to you by your health care provider. Make sure you discuss any questions you have with your health care provider. Document Revised: 07/11/2017 Document Reviewed: 07/11/2017 Elsevier Patient Education  [place].

## 2019-04-10 NOTE — Progress Notes (Signed)
   I, Christoper Fabian, LAT, ATC, am serving as scribe for Dr. Clementeen Graham.  Donna Booker is a 74 y.o. female who presents to [facility] today for f/u of L knee and calf pain.  She was last seen by Dr. Denyse Amass on 03/20/19 and had a L knee injection and was advised to begin taking cholchicine.  Since her last visit, pt reports that her L knee pain has improved but con't to have pain radiating into her L lower leg.  She states that her L knee swelling has resolved.  She con't to take her cholchicine daily.  She did not get her blood drawn as arranged at the last visit.  She notes the labs that she has plan to get drawn at her endocrinologist office have been delayed until this upcoming Monday.  She plans to get uric acid level drawn during that lab visit.  She does not take allopurinol or uric acid lowering medications.   Pertinent review of systems: No fevers or chills  Relevant historical information: Diabetes   Exam:  BP 130/70 (BP Location: Left Arm, Patient Position: Sitting, Cuff Size: Large)   Pulse 90   Ht 5' (1.524 m)   Wt 211 lb (95.7 kg)   SpO2 96%   BMI 41.21 kg/m  General: Well Developed, well nourished, and in no acute distress.   MSK: Left knee no effusion nontender normal motion.     Assessment and Plan: 74 y.o. female with significant improvement in pain effusion and Baker's cyst left knee following steroid injection and start of colchicine.  Likely fundamental underlying was exacerbation of DJD plus or minus gout.  Spent a lot of time today discussing gout strategies.  Fundamentally patient should get uric acid lab as ordered at the last visit.  This will be essential for starting uric acid lowering medication like allopurinol to ultimately control gout.  Discussed that she should continue taking colchicine for several months after starting allopurinol to prevent gout flare with the initiation of this medication.  Recheck back with me as  needed.  Anticipate uric acid level in the near future.     Discussed warning signs or symptoms. Please see discharge instructions. Patient expresses understanding.   The above documentation has been reviewed and is accurate and complete Clementeen Graham   Total encounter time 20 minutes including charting time date of service. Discussed gout management strategies.

## 2019-04-21 ENCOUNTER — Other Ambulatory Visit: Payer: Self-pay | Admitting: Pharmacist

## 2019-04-21 NOTE — Telephone Encounter (Signed)
Pt called regarding refill request on her Eliquis. Pt has a patient assistance request that is still pending from February. The staff called the patient assistant line to follow up on the request and will resent the application today. Provided pt with Eliquis 5 mg BID samples for 28 days. Pt will come by the clinic tomorrow to pick up the samples.

## 2019-04-22 ENCOUNTER — Other Ambulatory Visit (INDEPENDENT_AMBULATORY_CARE_PROVIDER_SITE_OTHER): Payer: Medicare Other

## 2019-04-22 ENCOUNTER — Other Ambulatory Visit: Payer: Self-pay

## 2019-04-22 DIAGNOSIS — M25562 Pain in left knee: Secondary | ICD-10-CM | POA: Diagnosis not present

## 2019-04-22 DIAGNOSIS — M10372 Gout due to renal impairment, left ankle and foot: Secondary | ICD-10-CM | POA: Diagnosis not present

## 2019-04-22 DIAGNOSIS — E0500 Thyrotoxicosis with diffuse goiter without thyrotoxic crisis or storm: Secondary | ICD-10-CM | POA: Diagnosis not present

## 2019-04-22 LAB — TSH: TSH: <0.01 u[IU]/mL — ABNORMAL LOW (ref 0.35–4.50)

## 2019-04-22 LAB — T4, FREE: Free T4: 0.76 ng/dL (ref 0.60–1.60)

## 2019-04-22 LAB — T3, FREE: T3, Free: 3.3 pg/mL (ref 2.3–4.2)

## 2019-04-23 ENCOUNTER — Other Ambulatory Visit: Payer: Self-pay | Admitting: Family Medicine

## 2019-04-23 ENCOUNTER — Other Ambulatory Visit: Payer: Medicare Other

## 2019-04-23 LAB — URIC ACID: Uric Acid, Serum: 8.7 mg/dL — ABNORMAL HIGH (ref 2.4–7.0)

## 2019-04-23 MED ORDER — ALLOPURINOL 300 MG PO TABS: 300.0000 mg | ORAL_TABLET | Freq: Every day | ORAL | 3 refills | Status: DC

## 2019-04-23 NOTE — Progress Notes (Signed)
Uric acid is elevated at 8.7.  This indicates gout as a cause of pain.  I have prescribed allopurinol.  Take allopurinol daily to reduce uric acid and prevent gout attacks.  Take with colchicine for the first 3 months to prevent you from having a gout attack while we are getting started with allopurinol.  This labs should be rechecked in 6 to 12 weeks.  Dr. Okey Dupre or myself can recheck this lab.  Schedule appointment with me or Dr. Okey Dupre in 6 to 12 months.

## 2019-04-24 ENCOUNTER — Other Ambulatory Visit: Payer: Self-pay | Admitting: Physical Therapy

## 2019-04-24 DIAGNOSIS — M109 Gout, unspecified: Secondary | ICD-10-CM

## 2019-04-28 ENCOUNTER — Other Ambulatory Visit: Payer: Self-pay | Admitting: Internal Medicine

## 2019-04-30 ENCOUNTER — Telehealth: Payer: Self-pay | Admitting: Internal Medicine

## 2019-04-30 MED ORDER — LEVEMIR FLEXTOUCH 100 UNIT/ML ~~LOC~~ SOPN: 50.0000 [IU] | PEN_INJECTOR | Freq: Every day | SUBCUTANEOUS | 3 refills | Status: DC

## 2019-04-30 NOTE — Telephone Encounter (Signed)
Patient started on 35 units for levemir and increased dose to 50 units - so patient has been running out of RX sooner than shes able to get it refilled due to taking more. Requesting a refill for RX for 50 units.  Medication Refill Request  Did you call your pharmacy and request this refill first? yes  . If patient has not contacted pharmacy first, instruct them to do so for future refills.  . Remind them that contacting the pharmacy for their refill is the quickest method to get the refill.  . Refill policy also stated that it will take anywhere between 24-72 hours to receive the refill.    Name of medication? levemir  Is this a 90 day supply? 30 day  Name and location of pharmacy?  [id number] - [place], [place] - [address] Phone:  [phone]  Fax:  [phone]

## 2019-04-30 NOTE — Telephone Encounter (Signed)
RX updated and sent. 

## 2019-05-29 ENCOUNTER — Other Ambulatory Visit: Payer: Self-pay | Admitting: Cardiology

## 2019-05-29 DIAGNOSIS — R600 Localized edema: Secondary | ICD-10-CM

## 2019-06-03 ENCOUNTER — Other Ambulatory Visit: Payer: Self-pay

## 2019-06-05 ENCOUNTER — Encounter: Payer: Self-pay | Admitting: Internal Medicine

## 2019-06-05 ENCOUNTER — Other Ambulatory Visit: Payer: Self-pay

## 2019-06-05 ENCOUNTER — Ambulatory Visit (INDEPENDENT_AMBULATORY_CARE_PROVIDER_SITE_OTHER): Payer: Medicare Other | Admitting: Internal Medicine

## 2019-06-05 VITALS — BP 120/82 | HR 77 | Ht 60.38 in | Wt 210.0 lb

## 2019-06-05 DIAGNOSIS — E1169 Type 2 diabetes mellitus with other specified complication: Secondary | ICD-10-CM

## 2019-06-05 DIAGNOSIS — E0500 Thyrotoxicosis with diffuse goiter without thyrotoxic crisis or storm: Secondary | ICD-10-CM | POA: Diagnosis not present

## 2019-06-05 DIAGNOSIS — E1165 Type 2 diabetes mellitus with hyperglycemia: Secondary | ICD-10-CM | POA: Diagnosis not present

## 2019-06-05 DIAGNOSIS — E785 Hyperlipidemia, unspecified: Secondary | ICD-10-CM

## 2019-06-05 DIAGNOSIS — Z794 Long term (current) use of insulin: Secondary | ICD-10-CM | POA: Diagnosis not present

## 2019-06-05 LAB — LIPID PANEL
Cholesterol: 149 mg/dL (ref 0–200)
HDL: 45.9 mg/dL (ref >39.00)
LDL Cholesterol: 80 mg/dL (ref 0–99)
NonHDL: 103.47
Total CHOL/HDL Ratio: 3
Triglycerides: 119 mg/dL (ref 0.0–149.0)
VLDL: 23.8 mg/dL (ref 0.0–40.0)

## 2019-06-05 LAB — MICROALBUMIN / CREATININE URINE RATIO
Creatinine,U: 140.4 mg/dL
Microalb Creat Ratio: 192.6 mg/g — ABNORMAL HIGH (ref 0.0–30.0)
Microalb, Ur: 270.5 mg/dL — ABNORMAL HIGH (ref 0.0–1.9)

## 2019-06-05 LAB — POCT GLYCOSYLATED HEMOGLOBIN (HGB A1C): Hemoglobin A1C: 7.3 % — AB (ref 4.0–5.6)

## 2019-06-05 LAB — T3, FREE: T3, Free: 3.2 pg/mL (ref 2.3–4.2)

## 2019-06-05 LAB — TSH: TSH: 0.46 u[IU]/mL (ref 0.35–4.50)

## 2019-06-05 LAB — T4, FREE: Free T4: 0.63 ng/dL (ref 0.60–1.60)

## 2019-06-05 NOTE — Patient Instructions (Signed)
Please continue: - Glipizide 10 mg 2x a day before meals  - Levemir 50 units at night  - Trulicity 3 mg weekly  Continue methimazole  5 mg 2x a day.  Please stop at the lab.  Come back for a follow up appt in 3 months.

## 2019-06-05 NOTE — Progress Notes (Signed)
Patient ID: Donna Booker, female   DOB: 11-Sep-1945, 74 y.o.   MRN: [id number]  This visit occurred during the SARS-CoV-2 public health emergency.  Safety protocols were in place, including screening questions prior to the visit, additional usage of staff PPE, and extensive cleaning of exam room while observing appropriate contact time as indicated for disinfecting solutions.   HPI: Donna Booker is a 74 y.o.-year-old female, presenting for f/u for DM2, dx in 1987, insulin-dependent since 2006, uncontrolled, with complications (PN, stage 2-3 CKD)and Graves ds. Last visit 3 months ago.  She will move to [place], [place], but she will continue to have medical care here.  Graves ds  - diagnosed during admission for A. fib with RVR in 05/2017  Reviewed her TFTs: Lab Results  Component Value Date   TSH <0.01 (L) 04/22/2019   TSH <0.01 (L) 03/03/2019   TSH <0.005 (L) 02/26/2019   TSH 10.31 (H) 12/04/2017   TSH <0.010 (L) 07/14/2017   TSH <0.01 (L) 07/06/2017   TSH <0.01 Repeated and verified X2. (L) 05/10/2017   TSH <0.010 (L) 04/16/2017   Lab Results  Component Value Date   FREET4 0.76 04/22/2019   FREET4 1.98 (H) 03/03/2019   FREET4 0.57 (L) 12/04/2017   FREET4 0.71 (L) 07/15/2017   FREET4 0.72 07/06/2017   FREET4 1.15 05/10/2017   FREET4 5.15 (H) 04/16/2017   Lab Results  Component Value Date   T3FREE 3.3 04/22/2019   T3FREE 4.4 (H) 03/03/2019   T3FREE 2.8 12/04/2017   T3FREE 2.1 07/15/2017   T3FREE 4.2 05/10/2017   T3FREE 18.5 (H) 04/16/2017   Her Graves' antibodies were elevated: Lab Results  Component Value Date   TSI 8.02 (H) 04/16/2017   She was started on methimazole 10 mg 2x a day and atenolol 50 mg 3x a day.Her TFTs were significantly improved >> we decreased the methimazole to 5 mg 2X a day, but then on 5 mg daily (? When changed - by PCP? -Patient cannot remember exactly when she started her prescription for methimazole only mentioned to take it once a day...).   We stopped methimazole in 12/2017 as the TSH was 10.  Unfortunately, she did not return for labs afterwards until 02/2019, when her TSH was suppressed completely.  We restarted methimazole at 5 mg 2x a day in 03/2019.  Subsequent labs improved.  She continues on Toprol-XL.  Pt denies: - feeling nodules in neck - hoarseness - dysphagia - choking - SOB with lying down  No FH of thyroid disease. No FH of thyroid cancer. No h/o radiation tx to head or neck.  No seaweed or kelp. No recent contrast studies. No herbal supplements. No Biotin use. No recent steroids use.   DM2: Reviewed HbA1c levels: Lab Results  Component Value Date   HGBA1C 8.8 (H) 02/26/2019   HGBA1C 13.0 04/26/2018   HGBA1C 14.1 (H) 03/19/2018   HGBA1C 13.1 (A) 12/28/2017   HGBA1C 9.8 (H) 07/14/2017   HGBA1C 8.0 02/01/2016   HGBA1C 8.3 (H) 10/20/2015   HGBA1C 8.4 07/29/2015   HGBA1C 10.0 04/27/2015   HGBA1C 9.5 (H) 12/29/2014   HGBA1C 9.1 (H) 09/01/2014   She is on: - Glipizide 10 mg 2x a day before meals - Levemir 40 >> 50 units at night  - Trulicity 1.5 mg weekly - started 12/2018 >> 3 mg weekly -increased 02/2019.  Unfortunately, this is now costing her a lot more (400$ c/w 40$) and she cannot afford it.  In the last  week, she did not take Trulicity She was previously on a VGo patch pump.   She also tried Bydureon (02/2013-10/2014) >> made her hungry, did not lower sugars  She is not checking sugars... lost meter. From last OV: - am: 99-138, 161 >> 94-128 >> ? >> 156-184 - 2h after b'fast: n/c - before lunch: 221 (cantaloupe) >> n/c - 2h after lunch: n/c >> 200 >> n/c >> 165-185 - before dinner: 119, 130-151 >> 120-125 >> ?  - 2h after dinner: n/c >> 189 >> n/c >> 175-201 - bedtime: 192, 265 >> 130-137 >> ? - nighttime: n/c Lowest sugar was 94 >> 135; she has hypoglycemia awareness in the 70s. Highest sugar was 298 >> 240  Glucometer: AccuChek  -+ CKD, last BUN/creatinine:  Lab Results  Component  Value Date   BUN 19 02/26/2019   CREATININE 1.29 (H) 02/26/2019  04/25/2018: Glucose 269, BUN/creatinine 18/1.05 No results found for: MICRALBCREAT  On olmesartan. -+ HL; last set of lipids: Lab Results  Component Value Date   CHOL 190 03/19/2018   HDL 46.00 03/19/2018   LDLCALC 55 09/01/2014   LDLDIRECT 98.0 03/19/2018   TRIG 217.0 (H) 03/19/2018   CHOLHDL 4 03/19/2018  On pravastatin. - last eye exam was in 10/2018: No DR -No numbness and tingling in her feet.   She previously had hyperkalemia, which we addressed in the past.  This normalized. - Possibly previously from ACE inhibitor, potassium supplementation +/-beta-blocker - After an initial K of 7.5 in 07/2017 >> K levels normalized after stopping ACEI and K supplements Lab Results  Component Value Date   K 4.9 02/26/2019   K 4.1 04/25/2018   K 3.9 03/19/2018   K 4.5 11/28/2017   K 4.5 11/01/2017  Of note, a cosyntropin stimulation test was normal and also aldosterone and renin were normal while in the hospital.   She had AKI on admission then.  Her son died in 06-25-2017-he had severe heart failure.  ROS: Constitutional: no weight gain/no weight loss, no fatigue, no subjective hyperthermia, no subjective hypothermia Eyes: no blurry vision, no xerophthalmia ENT: no sore throat, + see HPI Cardiovascular: no CP/no SOB/no palpitations/no leg swelling Respiratory: no cough/no SOB/no wheezing Gastrointestinal: no N/no V/no D/no C/no acid reflux Musculoskeletal: no muscle aches/+ joint aches (R knee) Skin: no rashes, no hair loss Neurological: no tremors/no numbness/no tingling/no dizziness  I reviewed pt's medications, allergies, PMH, social hx, family hx, and changes were documented in the history of present illness. Otherwise, unchanged from my initial visit note.  Past Medical History:  Diagnosis Date  . Afib (HCC)   . Diabetes mellitus   . Graves disease   . Heart failure (HCC)   . Hypertension   .  Hyperthyroidism    Past Surgical History:  Procedure Laterality Date  . BREAST SURGERY     Social History   Social History  . Marital Status: Married    Spouse Name: N/A  . Number of Children: 1   Occupational History  . 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 Smoking status: Never Smoker   . Smokeless tobacco: Not on file  . Alcohol Use: No  . Drug Use: No   Current Outpatient Medications on File Prior to Visit  Medication Sig Dispense Refill  . Abatacept (ORENCIA [place]) Inject into the skin every 30 (thirty) days.    Marland Kitchen allopurinol (ZYLOPRIM) 300 MG tablet Take 1 tablet (300 mg total) by mouth daily.  To reduce uric acid and prevent gout. Take with colchicine for first 3 months 90 tablet 3  . amLODipine (NORVASC) 10 MG tablet Take 1 tablet (10 mg total) by mouth daily. 90 tablet 1  . apixaban (ELIQUIS) 5 MG TABS tablet Take 1 tablet (5 mg total) by mouth 2 (two) times daily. 180 tablet 3  . Apoaequorin (PREVAGEN PO) Take 1 capsule by mouth daily.    . colchicine 0.6 MG tablet TAKE 1 TABLET BY MOUTH DAILY AS NEEDED FOR PAIN (Patient taking differently: daily. ) 90 tablet 1  . Dulaglutide (TRULICITY) 3 ZO/1.0RU SOPN Inject 3 mg into the skin once a week. 4 pen 11  . esomeprazole (NEXIUM) 40 MG capsule Take 40 mg by mouth daily before breakfast.    . furosemide (LASIX) 20 MG tablet TAKE 2 TABLETS (40 MG TOTAL) BY MOUTH 2 (TWO) TIMES DAILY. 360 tablet 1  . gabapentin (NEURONTIN) 300 MG capsule TAKE 1-2 CAPSULES (300-600 MG TOTAL) BY MOUTH 3 (THREE) TIMES DAILY. 180 capsule 0  . glipiZIDE (GLUCOTROL) 5 MG tablet TAKE 2 TABLETS (10 MG TOTAL) BY MOUTH 2 (TWO) TIMES DAILY BEFORE A MEAL. 360 tablet 3  . hydrALAZINE (APRESOLINE) 25 MG tablet Take 1 tablet (25 mg total) by mouth 3 (three) times daily. 90 tablet 3  . insulin detemir (LEVEMIR FLEXTOUCH) 100 UNIT/ML FlexPen Inject 50 Units into the skin at bedtime. 15 mL 3  . lidocaine (LIDODERM) 5 % Place 1 patch onto the skin daily.  Remove & Discard patch within 12 hours or as directed by MD 30 patch 0  . methimazole (TAPAZOLE) 5 MG tablet Take 1 tablet (5 mg total) by mouth 2 (two) times daily. 120 tablet 5  . metoprolol succinate (TOPROL-XL) 100 MG 24 hr tablet Take 1 tablet (100 mg total) by mouth daily. Take with or immediately following a meal. 90 tablet 1  . pravastatin (PRAVACHOL) 40 MG tablet TAKE 1 TABLET (40 MG TOTAL) BY MOUTH DAILY AT 6 PM. 90 tablet 1  . rosuvastatin (CRESTOR) 10 MG tablet Take 1 tablet (10 mg total) by mouth daily. 30 tablet 3  . VOLTAREN 1 % GEL Apply 2 g topically 4 (four) times daily. (Patient taking differently: Apply 2 g topically as needed. ) 100 g 3   No current facility-administered medications on file prior to visit.   Allergies  Allergen Reactions  . Tramadol Nausea And Vomiting    FH - see HPI  PE: BP 120/82   Pulse 77   Ht 5' 0.38" (1.534 m)   Wt 210 lb (95.3 kg)   SpO2 99%   BMI 40.50 kg/m  Wt Readings from Last 3 Encounters:  06/05/19 210 lb (95.3 kg)  04/10/19 211 lb (95.7 kg)  03/20/19 212 lb (96.2 kg)   Constitutional: overweight, in NAD Eyes: PERRLA, EOMI, no exophthalmos ENT: moist mucous membranes, no thyromegaly, no cervical lymphadenopathy Cardiovascular: RRR, No MRG Respiratory: CTA B Gastrointestinal: abdomen soft, NT, ND, BS+ Musculoskeletal: no deformities, strength intact in all 4 Skin: moist, warm, no rashes Neurological: no tremor with outstretched hands, DTR normal in all 4  ASSESSMENT: 1. 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  Graves' disease  3.  HL  We previously also addressed her hyperkalemia: - in 07/2017, she had a critical value of potassium of 7.5.  She was admitted with high potassium since then >> Lasix, potassium supplementation, and lisinopril were stopped. - A cosyntropin stimulation test performed in  the hospital was normal, ruling out primary adrenal insufficiency as a cause  for hyperkalemia - Aldosterone and renin levels were normal during her last hospitalization - On her medication list, I do not see any possible culprits,  other than possibly propranolol - I suggested a referral to nephrology  if her potassium continues to remain elevated after stopping ACE inhibitor and potassium supplementation - Potassium normalized afterwards  PLAN:  1. Patient with longstanding, uncontrolled, type 2 diabetes, on basal insulin, sulfonylurea, and weekly GLP-1 receptor agonist.  She was previously on Victoza, but we were able to switch to Trulicity.  At last visit, we increased the dose of her Levemir as her HbA1c was very high.  I advised her to get in touch with me about her sugars, but she did not do so.  She returns after long absence of almost a year.  Latest HbA1c was 8.8%, improved, though, from 13% at last visit. -At this visit, sugars are still above target.  We will continue glipizide and Levemir and increased Trulicity dose.  She is tolerating it well.  However, she cannot afford insulin.  We gave her samples at today's visit and also a printed application for financial assistance with [employer].  I am really hoping that we can continue with Trulicity for her, since she now has the best control of her diabetes in the last at least 5 years: HbA1c today is 7.3% (much better) -She is not checking sugars as she lost her meter.  I strongly advised her to restart checking. - I suggested to:  Patient Instructions  Please continue: - Glipizide 10 mg 2x a day before meals  - Levemir 50 units at night  - Trulicity 3 mg weekly  Continue methimazole  5 mg 2x a day.  Please stop at the lab.  Come back for a follow up appt in 3 months.  - advised to check sugars at different times of the day - 1x a day, rotating check times - advised for yearly eye exams >> she is UTD - return to clinic in 3 months   2.  Graves' disease -Her hypothyroidism was diagnosed after she was admitted  with A. fib with RVR and acute pulmonary edema in 04/2017 -She had elevated TSI antibodies, pointing towards a diagnosis of Graves' disease -She initially had symptoms of weight loss, diarrhea, but no tremors, palpitations, anxiety, heat intolerance -we stopped the methimazole completely in the past when TSH increased to 10.  However, at last visit, TSH was suppressed so we restarted methimazole at 5 mg twice a day. Latest TSH was still suppressed, however, her free thyroid hormones were improved in 04/2019 so we continued the same dose of methimazole -At this visit, we will recheck her TFTs -For now, continue methimazole 5 mg twice a day, but will adjust the dose depending on the test results -I will see her back in 3 months  3. HL -Reviewed lipid panel from 03/2018: LDL above goal of less than 70 (in the setting of CKD), triglycerides high, HDL at goal: Lab Results  Component Value Date   CHOL 190 03/19/2018   HDL 46.00 03/19/2018   LDLCALC 55 09/01/2014   LDLDIRECT 98.0 03/19/2018   TRIG 217.0 (H) 03/19/2018   CHOLHDL 4 03/19/2018  -Continues pravastatin without side effects -She is due for another lipid panel -we will check today  Office Visit on 06/05/2019  Component Date Value Ref Range Status  . TSH 06/05/2019 0.46  0.35 - 4.50 uIU/mL  Final  . Free T4 06/05/2019 0.63  0.60 - 1.60 ng/dL Final   Comment: Specimens from patients who are undergoing biotin therapy and /or ingesting biotin supplements may contain high levels of biotin.  The higher biotin concentration in these specimens interferes with this Free T4 assay.  Specimens that contain high levels  of biotin may cause false high results for this Free T4 assay.  Please interpret results in light of the total clinical presentation of the patient.    . T3, Free 06/05/2019 3.2  2.3 - 4.2 pg/mL Final  . Cholesterol 06/05/2019 149  0 - 200 mg/dL Final   ATP III Classification       Desirable:  < 200 mg/dL               Borderline  High:  200 - 239 mg/dL          High:  > = 382 mg/dL  . Triglycerides 06/05/2019 119.0  0.0 - 149.0 mg/dL Final   Normal:  <505 mg/dLBorderline High:  150 - 199 mg/dL  . HDL 06/05/2019 45.90  >39.00 mg/dL Final  . VLDL 39/76/7341 23.8  0.0 - 40.0 mg/dL Final  . LDL Cholesterol 06/05/2019 80  0 - 99 mg/dL Final  . Total CHOL/HDL Ratio 06/05/2019 3   Final                  Men          Women1/2 Average Risk     3.4          3.3Average Risk          5.0          4.42X Average Risk          9.6          7.13X Average Risk          15.0          11.0                      . NonHDL 06/05/2019 103.47   Final   NOTE:  Non-HDL goal should be 30 mg/dL higher than patient's LDL goal (i.e. LDL goal of < 70 mg/dL, would have non-HDL goal of < 100 mg/dL)  . Microalb, Ur 06/05/2019 270.5* 0.0 - 1.9 mg/dL Final  . Creatinine,U 93/79/0240 140.4  mg/dL Final  . Microalb Creat Ratio 06/05/2019 192.6* 0.0 - 30.0 mg/g Final  . Hemoglobin A1C 06/05/2019 7.3* 4.0 - 5.6 % Final   Elevated microalbumin to creatinine ratio.  We will need to recheck at next visit.  We will check with her if she is taking the olmesartan. LDL slightly above goal, but improved since last visit. Thyroid tests are normal.  We will continue the current methimazole dose.  Carlus Pavlov, MD PhD [facility]

## 2019-06-06 ENCOUNTER — Telehealth: Payer: Self-pay

## 2019-06-06 NOTE — Telephone Encounter (Signed)
Left message for patient to return our call at [phone].  

## 2019-06-06 NOTE — Telephone Encounter (Signed)
-----   Message from Carlus Pavlov, MD sent at 06/05/2019  5:22 PM EDT ----- Efraim Kaufmann, can you please call pt:  She has a slightly high urine protein level.  We will need to recheck at next visit.  Please check with her if she is taking the olmesartan, which should help decrease the urine proteins. LDL cholesterol slightly above goal, but improved since last visit. Thyroid tests are normal.  We can continue the current methimazole dose.

## 2019-06-12 ENCOUNTER — Other Ambulatory Visit: Payer: Self-pay | Admitting: Cardiology

## 2019-06-12 NOTE — Telephone Encounter (Signed)
Left message for patient to return our call at [phone].  

## 2019-06-15 ENCOUNTER — Other Ambulatory Visit: Payer: Self-pay | Admitting: Cardiology

## 2019-06-15 ENCOUNTER — Other Ambulatory Visit: Payer: Self-pay | Admitting: Internal Medicine

## 2019-06-15 DIAGNOSIS — E782 Mixed hyperlipidemia: Secondary | ICD-10-CM

## 2019-07-03 ENCOUNTER — Encounter: Payer: Self-pay | Admitting: Cardiology

## 2019-07-03 ENCOUNTER — Ambulatory Visit: Payer: Medicare Other | Admitting: Cardiology

## 2019-07-03 ENCOUNTER — Other Ambulatory Visit: Payer: Self-pay

## 2019-07-03 VITALS — BP 137/70 | HR 82 | Resp 17 | Ht 60.0 in | Wt 212.0 lb

## 2019-07-03 DIAGNOSIS — E782 Mixed hyperlipidemia: Secondary | ICD-10-CM

## 2019-07-03 DIAGNOSIS — Z8639 Personal history of other endocrine, nutritional and metabolic disease: Secondary | ICD-10-CM

## 2019-07-03 DIAGNOSIS — I10 Essential (primary) hypertension: Secondary | ICD-10-CM

## 2019-07-03 DIAGNOSIS — I480 Paroxysmal atrial fibrillation: Secondary | ICD-10-CM

## 2019-07-03 NOTE — Progress Notes (Signed)
Follow up visit  Subjective:   Donna Booker, female    DOB: 02/25/1945, 74 y.o.   MRN: [id number]   Chief Complaint  Patient presents with  . Hypertension  . Follow-up    3 month     HPI  74 year old African American female withf Graves' disease with hyperthyroidism, paroxysmal atrial fibrillation, mild PH, HFpEF (secondary to Afib), uncontrolled type II diabetes mellitus with diabetic polyneuropathy, CKD stage III.  Since her last visit with me, her hyperthyroid state has improved on methimazole.  Blood pressure not well controlled.  Patient denies chest pain, shortness of breath, palpitations, leg edema, orthopnea, PND, TIA/syncope.   Current Outpatient Medications on File Prior to Visit  Medication Sig Dispense Refill  . Abatacept (ORENCIA [place]) Inject into the skin every 30 (thirty) days.    Marland Kitchen allopurinol (ZYLOPRIM) 300 MG tablet Take 1 tablet (300 mg total) by mouth daily. To reduce uric acid and prevent gout. Take with colchicine for first 3 months 90 tablet 3  . amLODipine (NORVASC) 10 MG tablet Take 1 tablet (10 mg total) by mouth daily. 90 tablet 1  . apixaban (ELIQUIS) 5 MG TABS tablet Take 1 tablet (5 mg total) by mouth 2 (two) times daily. 180 tablet 3  . Apoaequorin (PREVAGEN PO) Take 1 capsule by mouth daily.    . colchicine 0.6 MG tablet TAKE 1 TABLET BY MOUTH DAILY AS NEEDED FOR PAIN (Patient taking differently: daily. ) 90 tablet 1  . Dulaglutide (TRULICITY) 3 NF/6.2ZH SOPN Inject 3 mg into the skin once a week. 4 pen 11  . esomeprazole (NEXIUM) 40 MG capsule Take 40 mg by mouth daily before breakfast.    . furosemide (LASIX) 20 MG tablet TAKE 2 TABLETS (40 MG TOTAL) BY MOUTH 2 (TWO) TIMES DAILY. 360 tablet 1  . gabapentin (NEURONTIN) 300 MG capsule TAKE 1-2 CAPSULES (300-600 MG TOTAL) BY MOUTH 3 (THREE) TIMES DAILY. 180 capsule 0  . glipiZIDE (GLUCOTROL) 5 MG tablet TAKE 2 TABLETS (10 MG TOTAL) BY MOUTH 2 (TWO) TIMES DAILY BEFORE A MEAL. 360 tablet 3  .  hydrALAZINE (APRESOLINE) 25 MG tablet TAKE 1 TABLET BY MOUTH THREE TIMES A DAY 90 tablet 0  . insulin detemir (LEVEMIR FLEXTOUCH) 100 UNIT/ML FlexPen Inject 50 Units into the skin at bedtime. 15 mL 3  . lidocaine (LIDODERM) 5 % Place 1 patch onto the skin daily. Remove & Discard patch within 12 hours or as directed by MD 30 patch 0  . methimazole (TAPAZOLE) 5 MG tablet Take 1 tablet (5 mg total) by mouth 2 (two) times daily. 120 tablet 5  . metoprolol succinate (TOPROL-XL) 100 MG 24 hr tablet Take 1 tablet (100 mg total) by mouth daily. Take with or immediately following a meal. 90 tablet 1  . pravastatin (PRAVACHOL) 40 MG tablet TAKE 1 TABLET (40 MG TOTAL) BY MOUTH DAILY AT 6 PM. 90 tablet 1  . rosuvastatin (CRESTOR) 10 MG tablet TAKE 1 TABLET BY MOUTH EVERY DAY 90 tablet 1  . VOLTAREN 1 % GEL Apply 2 g topically 4 (four) times daily. (Patient taking differently: Apply 2 g topically as needed. ) 100 g 3   No current facility-administered medications on file prior to visit.    Cardiovascular studies:  EKG 02/26/2019: Sinus rhythm 88 vpm.  Borderline left atrial enlargement.  Cannot exclude old anteroseptal infarct.  EKG 08/22/2018: Sinus rhythm 85 bpm.  Boderline LVH. Early repolarization, anteroseptal leads.   Echocardiogram 07/26/2017: Left ventricle cavity is normal  in size. Moderate concentric hypertrophy of the left ventricle. Normal global wall motion. Doppler evidence of grade I (impaired) diastolic dysfunction, elevated LAP. Calculated EF 64%. Left atrial cavity is moderately dilated at 4.7 cm. Mild calcification of the aortic valve annulus. Trileaflet aortic valve with no regurgitation noted. Mild tricuspid regurgitation. Mild pulmonary hypertension. IVC is normal with respiratory variation. Compared to the hospital echocardiogram 04/16/2017, moderate RA enlargement not present  and moderate pulmonary hypertension is now mild.  Renal artery duplex ([facility])  05/23/2017: No renal artery stenosis  Recent labs: 06/05/2019: HbA1C 7.3% Chol 149, TG 119, HDL 45, LDL 80 TSH 0.46 normal  04/25/2018: Glucose 269, BUN/Cr 18/1.1. EGFR 58. Na/K 142/4.1. Rest of the CMP normal H/H 12/37. MCV 75. Platelets 306 HbA1C 13% Chol 190, TG 217, HDL 46, LDL 98   Review of Systems  Cardiovascular: Negative for chest pain, dyspnea on exertion, leg swelling, palpitations and syncope.         Vitals:   07/03/19 1012  BP: 137/70  Pulse: 82  Resp: 17  SpO2: 97%     Body mass index is 41.4 kg/m. Filed Weights   07/03/19 1012  Weight: 212 lb (96.2 kg)     Objective:   Physical Exam Vitals and nursing note reviewed.  Constitutional:      Appearance: She is well-developed.  Neck:     Vascular: No JVD.  Cardiovascular:     Rate and Rhythm: Normal rate and regular rhythm.     Pulses: Intact distal pulses.     Heart sounds: Normal heart sounds. No murmur heard.   Pulmonary:     Effort: Pulmonary effort is normal.     Breath sounds: Normal breath sounds. No wheezing or rales.           Assessment & Recommendations:   74 year old African American female withf Graves' disease with hyperthyroidism, paroxysmal atrial fibrillation, mild PH, HFpEF (secondary to Afib), uncontrolled type II diabetes mellitus with diabetic polyneuropathy, CKD stage III  PAF: Maintaining sinus rhythm. CHA2DS2VASc score 4, annual stroke risk 5%. Continue anticaogualtion with eliquis 5 mg bid. Will appeal denial of patient assistance from manufacture. Gave samples.   Hypertension: Well controlled.  She should not be on ACEi/ARB/aldosterone antagonist due to pevious episodes of hyperkalemia.  Grave's disease, DM:  Needs f/u w/endocrinology. I will check labs today.   F/u in 6 months   Caidin Heidenreich Esther Hardy, MD [facility] [place] Pager: [phone] Office: [phone] If no answer Cell [phone]

## 2019-07-08 ENCOUNTER — Other Ambulatory Visit: Payer: Self-pay | Admitting: Cardiology

## 2019-07-25 ENCOUNTER — Other Ambulatory Visit: Payer: Self-pay | Admitting: Internal Medicine

## 2019-08-02 ENCOUNTER — Other Ambulatory Visit: Payer: Self-pay | Admitting: Cardiology

## 2019-08-03 ENCOUNTER — Other Ambulatory Visit: Payer: Self-pay | Admitting: Internal Medicine

## 2019-08-23 ENCOUNTER — Other Ambulatory Visit: Payer: Self-pay | Admitting: Internal Medicine

## 2019-08-28 ENCOUNTER — Other Ambulatory Visit: Payer: Self-pay | Admitting: Cardiology

## 2019-08-28 DIAGNOSIS — I10 Essential (primary) hypertension: Secondary | ICD-10-CM

## 2019-09-11 ENCOUNTER — Telehealth: Payer: Self-pay

## 2019-09-11 NOTE — Telephone Encounter (Signed)
I faxed auth for genetic testing  Donna Booker [phone]

## 2019-09-19 ENCOUNTER — Encounter: Payer: Self-pay | Admitting: Internal Medicine

## 2019-09-19 ENCOUNTER — Ambulatory Visit (INDEPENDENT_AMBULATORY_CARE_PROVIDER_SITE_OTHER): Payer: Medicare Other | Admitting: Internal Medicine

## 2019-09-19 ENCOUNTER — Other Ambulatory Visit (INDEPENDENT_AMBULATORY_CARE_PROVIDER_SITE_OTHER): Payer: Medicare Other

## 2019-09-19 ENCOUNTER — Other Ambulatory Visit: Payer: Self-pay

## 2019-09-19 VITALS — BP 144/72 | HR 80 | Ht 60.0 in | Wt 204.0 lb

## 2019-09-19 DIAGNOSIS — E1165 Type 2 diabetes mellitus with hyperglycemia: Secondary | ICD-10-CM

## 2019-09-19 DIAGNOSIS — E0500 Thyrotoxicosis with diffuse goiter without thyrotoxic crisis or storm: Secondary | ICD-10-CM | POA: Diagnosis not present

## 2019-09-19 DIAGNOSIS — E785 Hyperlipidemia, unspecified: Secondary | ICD-10-CM

## 2019-09-19 DIAGNOSIS — Z794 Long term (current) use of insulin: Secondary | ICD-10-CM | POA: Diagnosis not present

## 2019-09-19 DIAGNOSIS — E1169 Type 2 diabetes mellitus with other specified complication: Secondary | ICD-10-CM

## 2019-09-19 LAB — T4, FREE: Free T4: 0.61 ng/dL (ref 0.60–1.60)

## 2019-09-19 LAB — POCT GLYCOSYLATED HEMOGLOBIN (HGB A1C): Hemoglobin A1C: 8.6 % — AB (ref 4.0–5.6)

## 2019-09-19 LAB — MICROALBUMIN / CREATININE URINE RATIO
Creatinine,U: 68.5 mg/dL
Microalb Creat Ratio: 110 mg/g — ABNORMAL HIGH (ref 0.0–30.0)
Microalb, Ur: 75.4 mg/dL — ABNORMAL HIGH (ref 0.0–1.9)

## 2019-09-19 LAB — TSH: TSH: 3.03 u[IU]/mL (ref 0.35–4.50)

## 2019-09-19 LAB — T3, FREE: T3, Free: 3.1 pg/mL (ref 2.3–4.2)

## 2019-09-19 MED ORDER — OZEMPIC (0.25 OR 0.5 MG/DOSE) 2 MG/1.5ML ~~LOC~~ SOPN: PEN_INJECTOR | SUBCUTANEOUS | 0 refills | Status: DC

## 2019-09-19 MED ORDER — TRULICITY 3 MG/0.5ML ~~LOC~~ SOAJ
3.0000 mg | SUBCUTANEOUS | 3 refills | Status: DC
Start: 2019-09-19 — End: 2021-04-18

## 2019-09-19 NOTE — Progress Notes (Addendum)
Patient ID: Donna Booker, female   DOB: Nov 05, 1945, 74 y.o.   MRN: [id number]  This visit occurred during the SARS-CoV-2 public health emergency.  Safety protocols were in place, including screening questions prior to the visit, additional usage of staff PPE, and extensive cleaning of exam room while observing appropriate contact time as indicated for disinfecting solutions.   HPI: Donna Booker is a 74 y.o.-year-old female, presenting for f/u for DM2, dx in 1987, insulin-dependent since 2006, uncontrolled, with complications (PN, stage 2-3 CKD)and Graves ds. Last visit 3.5 months ago.  Graves ds  - diagnosed during admission for A. fib with RVR in 05/2017  Reviewed her TFTs: Lab Results  Component Value Date   TSH 0.46 06/05/2019   TSH <0.01 (L) 04/22/2019   TSH <0.01 (L) 03/03/2019   TSH <0.005 (L) 02/26/2019   TSH 10.31 (H) 12/04/2017   TSH <0.010 (L) 07/14/2017   TSH <0.01 (L) 07/06/2017   TSH <0.01 Repeated and verified X2. (L) 05/10/2017   TSH <0.010 (L) 04/16/2017   Lab Results  Component Value Date   FREET4 0.63 06/05/2019   FREET4 0.76 04/22/2019   FREET4 1.98 (H) 03/03/2019   FREET4 0.57 (L) 12/04/2017   FREET4 0.71 (L) 07/15/2017   FREET4 0.72 07/06/2017   FREET4 1.15 05/10/2017   FREET4 5.15 (H) 04/16/2017   Lab Results  Component Value Date   T3FREE 3.2 06/05/2019   T3FREE 3.3 04/22/2019   T3FREE 4.4 (H) 03/03/2019   T3FREE 2.8 12/04/2017   T3FREE 2.1 07/15/2017   T3FREE 4.2 05/10/2017   T3FREE 18.5 (H) 04/16/2017   Her Graves' antibodies were elevated: Lab Results  Component Value Date   TSI 8.02 (H) 04/16/2017   She was started on methimazole 10 mg 2x a day and atenolol 50 mg 3x a day.Her TFTs were significantly improved >> we decreased the methimazole to 5 mg 2X a day, but then on 5 mg daily (? When changed - by PCP? -Patient cannot remember exactly when she started her prescription for methimazole only mentioned to take it once a day...).  We  stopped methimazole in 12/2017 as the TSH was 10.  Unfortunately, she did not return for labs afterwards until 02/2019, when her TSH was suppressed completely.  We restarted methimazole 5 mg twice a day in 03/2019.  She continues on Toprol-XL.  Pt denies: - feeling nodules in neck - hoarseness - dysphagia - choking - SOB with lying down  No FH of thyroid disease. No FH of thyroid cancer. No h/o radiation tx to head or neck.  No herbal supplements. No Biotin use. No recent steroids use.   DM2: Reviewed HbA1c levels:  Lab Results  Component Value Date   HGBA1C 7.3 (A) 06/05/2019   HGBA1C 8.8 (H) 02/26/2019   HGBA1C 13.0 04/26/2018   HGBA1C 14.1 (H) 03/19/2018   HGBA1C 13.1 (A) 12/28/2017   HGBA1C 9.8 (H) 07/14/2017   HGBA1C 8.0 02/01/2016   HGBA1C 8.3 (H) 10/20/2015   HGBA1C 8.4 07/29/2015   HGBA1C 10.0 04/27/2015   HGBA1C 9.5 (H) 12/29/2014   HGBA1C 9.1 (H) 09/01/2014   She is on: - Glipizide 10 mg 2x a day before meals - Levemir 40 >> 50 units at night  - Trulicity 1.5 mg weekly - started 12/2018 >> 3 mg weekly -increased 02/2019.  Unfortunately, this became more expensive (400$ c/w 40$) -at last visit I suggested to apply for patient assistance >> did not apply... ran out She was previously on  a VGo patch pump.   She also tried Bydureon (02/2013-10/2014) >> made her hungry, did not lower sugars  At last visit, she was not checking sugars. Still not checking... However, she checked ~3 mo ago with expired strips: - am: 99-138, 161 >> 94-128 >> ? >> 156-184 >> 125-154 - 2h after b'fast: n/c - before lunch: 221 (cantaloupe) >> n/c - 2h after lunch: n/c >> 200 >> n/c >> 165-185 >> n/c - before dinner: 119, 130-151 >> 120-125 >> ? >> 134-144 - 2h after dinner: n/c >> 189 >> n/c >> 175-201 >> n/c - bedtime: 192, 265 >> 130-137 >> ? - nighttime: n/c Lowest sugar was 94 >> 135 >> 125; she has hypoglycemia awareness in the 70s. Highest sugar was 298 >> 240 >>  154.  Glucometer: AccuChek  -+ CKD, last BUN/creatinine:  Lab Results  Component Value Date   BUN 19 02/26/2019   CREATININE 1.29 (H) 02/26/2019  04/25/2018: Glucose 269, BUN/creatinine 18/1.05 Lab Results  Component Value Date   MICRALBCREAT 192.6 (H) 06/05/2019  On olmesartan.  -+ HL; last set of lipids: Lab Results  Component Value Date   CHOL 149 06/05/2019   HDL 45.90 06/05/2019   LDLCALC 80 06/05/2019   LDLDIRECT 98.0 03/19/2018   TRIG 119.0 06/05/2019   CHOLHDL 3 06/05/2019  On pravastatin. - last eye exam was in 10/2018: No DR -No numbness and tingling in her feet.   She previously had hyperkalemia, which we addressed in the past.  This normalized. - Possibly previously from ACE inhibitor, potassium supplementation +/-beta-blocker - After an initial K of 7.5 in 07/2017 >> K levels normalized after stopping ACEI and K supplements Lab Results  Component Value Date   K 4.9 02/26/2019   K 4.1 04/25/2018   K 3.9 03/19/2018   K 4.5 11/28/2017   K 4.5 11/01/2017  Of note, a cosyntropin stimulation test was normal and also aldosterone and renin were normal while in the hospital.   She had AKI on admission then.  Her son died in 07-07-2017-he had severe heart failure.  ROS: Constitutional: no weight gain/no weight loss, no fatigue, no subjective hyperthermia, no subjective hypothermia Eyes: no blurry vision, no xerophthalmia ENT: no sore throat, + see HPI Cardiovascular: no CP/no SOB/no palpitations/no leg swelling Respiratory: no cough/no SOB/no wheezing Gastrointestinal: no N/no V/no D/no C/no acid reflux Musculoskeletal: no muscle aches/+ joint aches (right knee) Skin: no rashes, no hair loss Neurological: no tremors/no numbness/no tingling/no dizziness  I reviewed pt's medications, allergies, PMH, social hx, family hx, and changes were documented in the history of present illness. Otherwise, unchanged from my initial visit note.  Past Medical History:   Diagnosis Date  . Afib (HCC)   . Diabetes mellitus   . Graves disease   . Heart failure (HCC)   . Hypertension   . Hyperthyroidism    Past Surgical History:  Procedure Laterality Date  . BREAST SURGERY     Social History   Social History  . Marital Status: Married    Spouse Name: N/A  . Number of Children: 1   Occupational History  . 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 Smoking status: Never Smoker   . Smokeless tobacco: Not on file  . Alcohol Use: No  . Drug Use: No   Current Outpatient Medications on File Prior to Visit  Medication Sig Dispense Refill  . Abatacept (ORENCIA [place]) Inject into the skin every 30 (thirty) days.    Marland Kitchen  allopurinol (ZYLOPRIM) 300 MG tablet Take 1 tablet (300 mg total) by mouth daily. To reduce uric acid and prevent gout. Take with colchicine for first 3 months 90 tablet 3  . amLODipine (NORVASC) 10 MG tablet Take 1 tablet (10 mg total) by mouth daily. 90 tablet 1  . apixaban (ELIQUIS) 5 MG TABS tablet Take 1 tablet (5 mg total) by mouth 2 (two) times daily. 180 tablet 3  . Apoaequorin (PREVAGEN PO) Take 1 capsule by mouth daily.    . colchicine 0.6 MG tablet TAKE 1 TABLET BY MOUTH DAILY AS NEEDED FOR PAIN (Patient taking differently: daily. ) 90 tablet 1  . Dulaglutide (TRULICITY) 3 MG/0.5ML SOPN Inject 3 mg into the skin once a week. 4 pen 11  . esomeprazole (NEXIUM) 40 MG capsule Take 40 mg by mouth daily before breakfast.    . furosemide (LASIX) 20 MG tablet TAKE 2 TABLETS (40 MG TOTAL) BY MOUTH 2 (TWO) TIMES DAILY. 360 tablet 1  . gabapentin (NEURONTIN) 300 MG capsule TAKE 1-2 CAPSULES (300-600 MG TOTAL) BY MOUTH 3 (THREE) TIMES DAILY. 180 capsule 2  . glipiZIDE (GLUCOTROL) 5 MG tablet TAKE 2 TABLETS (10 MG TOTAL) BY MOUTH 2 (TWO) TIMES DAILY BEFORE A MEAL. 360 tablet 3  . hydrALAZINE (APRESOLINE) 25 MG tablet TAKE 1 TABLET BY MOUTH THREE TIMES A DAY 90 tablet 3  . LEVEMIR FLEXTOUCH 100 UNIT/ML FlexPen INJECT 50 UNITS INTO THE SKIN  AT BEDTIME 15 mL 3  . lidocaine (LIDODERM) 5 % Place 1 patch onto the skin daily. Remove & Discard patch within 12 hours or as directed by MD 30 patch 0  . methimazole (TAPAZOLE) 5 MG tablet Take 1 tablet (5 mg total) by mouth 2 (two) times daily. 120 tablet 5  . metoprolol succinate (TOPROL-XL) 100 MG 24 hr tablet Take 1 tablet (100 mg total) by mouth daily. Take with or immediately following a meal. 90 tablet 1  . metoprolol succinate (TOPROL-XL) 50 MG 24 hr tablet TAKE 1 TABLET (50 MG TOTAL) BY MOUTH DAILY. TAKE WITH OR IMMEDIATELY FOLLOWING A MEAL. 90 tablet 2  . pravastatin (PRAVACHOL) 40 MG tablet TAKE 1 TABLET (40 MG TOTAL) BY MOUTH DAILY AT 6 PM. 90 tablet 1  . rosuvastatin (CRESTOR) 10 MG tablet TAKE 1 TABLET BY MOUTH EVERY DAY 90 tablet 1  . VOLTAREN 1 % GEL Apply 2 g topically 4 (four) times daily. (Patient taking differently: Apply 2 g topically as needed. ) 100 g 3   No current facility-administered medications on file prior to visit.   Allergies  Allergen Reactions  . Tramadol Nausea And Vomiting    FH - see HPI  PE: BP (!) 144/72   Pulse 80   Ht 5' (1.524 m)   Wt 204 lb (92.5 kg)   SpO2 98%   BMI 39.84 kg/m  Wt Readings from Last 3 Encounters:  09/19/19 204 lb (92.5 kg)  07/03/19 212 lb (96.2 kg)  06/05/19 210 lb (95.3 kg)   Constitutional: overweight, in NAD Eyes: PERRLA, EOMI, no exophthalmos ENT: moist mucous membranes, no thyromegaly, no cervical lymphadenopathy Cardiovascular: RRR, No MRG Respiratory: CTA B Gastrointestinal: abdomen soft, NT, ND, BS+ Musculoskeletal: no deformities, strength intact in all 4 Skin: moist, warm, no rashes Neurological: no tremor with outstretched hands, DTR normal in all 4  ASSESSMENT: 1. 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  Graves' disease  3.  HL  We previously also addressed her hyperkalemia: -  in 07/2017, she had a critical value of potassium of 7.5.  She was  admitted with high potassium since then >> Lasix, potassium supplementation, and lisinopril were stopped. - A cosyntropin stimulation test performed in the hospital was normal, ruling out primary adrenal insufficiency as a cause for hyperkalemia - Aldosterone and renin levels were normal during her last hospitalization - On her medication list, I do not see any possible culprits,  other than possibly propranolol - I suggested a referral to nephrology  if her potassium continues to remain elevated after stopping ACE inhibitor and potassium supplementation - Potassium normalized afterwards  PLAN:  1. Patient with longstanding, uncontrolled, type 2 diabetes, on basal insulin, oral medication (sulfonylurea), and weekly GLP-1 receptor agonist.  She was previously on Victoza but we were able to switch to Trulicity.  At last visit, she returned after an absence of almost a year.  HbA1c was 8.8% before the visit, above target, but improved from 13% at the previous visit.  However, at last visit, HbA1c was even better, at 7.3%.  At that time, she was not checking blood sugars as she lost her meter.  I advised her to restart checking. -At this visit, she is still not checking sugars.  She has a meter and had old strips, which expired in 2019 (she brings them today) she did check with these 3 months ago and the sugars are lower than expected.  I strongly advised her that I cannot help her unless she starts checking sugars.  She agrees to go to [place] and get the [employer] meter and start checking. -At today's visit, she tells me that she was not able to start Trulicity.  She did not send her patient application paperwork as advised at last visit.  She has it with her today, feeling get out during the visit.  We will try to send it.  She also feels that her new insurance may be covering Trulicity better.  I sent this to her pharmacy.  I also gave her an Ozempic pen sample to use until she can start Trulicity.  I  strongly advised her to let me know if she cannot continue with a GLP-1 receptor agonist, in which case, will need to use mealtime insulin.  However, I need to have more data about her sugars before starting this. - I suggested to:  Patient Instructions  Please start checking sugars  - get the ReliOn meter form [facility].  Please continue: - Glipizide 10 mg 2x a day - Levemir 50 mg at night  Try to start: - Trulicity 3 mg weekly  For now, start Ozempic 0.5 mg weekly.  Please let me now if you cannot start the Trulicity.  Please return in 1.5 months with your sugar log.   - we checked her HbA1c: 8.6% (higher) - advised to check sugars at different times of the day - 1-2x a day, rotating check times - advised for yearly eye exams >> she is UTD - At last visit, she had elevated ACR-we will recheck this today - return to clinic in 3 months  2.  Graves' disease -Her hyperthyroidism was diagnosed after she was admitted with A. fib with RVR and acute pulmonary edema in 04/2017  -She had elevated TSI antibodies, pointing towards a diagnosis of Graves' disease -She initially had weight loss, diarrhea, but no tremors, palpitations, anxiety, heat intolerance.  -At this visit, she does not report any hyperthyroid symptoms -we stopped the methimazole completely in the past when  TSH increased to 10.  However, TSH was suppressed afterwards so we restarted methimazole 5 mg twice a day in 03/2019.  At last visit, we continued the same dose as TFTs were normal. -We will continue the same dose of methimazole -We will recheck her TFTs today -I will see her back in 3 months  3. HL -Reviewed the latest lipid panel from 06/2019: LDL slightly above goal of less than 70, but improved, the rest of the fractions at goal: Lab Results  Component Value Date   CHOL 149 06/05/2019   HDL 45.90 06/05/2019   LDLCALC 80 06/05/2019   LDLDIRECT 98.0 03/19/2018   TRIG 119.0 06/05/2019   CHOLHDL 3 06/05/2019  -We  will continue her pravastatin-she has no side effects from this.  Component     Latest Ref Rng & Units 09/19/2019  Microalb, Ur     0.0 - 1.9 mg/dL 64.4 (H)  Creatinine,U     mg/dL 03.4  MICROALB/CREAT RATIO     0.0 - 30.0 mg/g 110.0 (H)  TSH     0.35 - 4.50 uIU/mL 3.03  T4,Free(Direct)     0.60 - 1.60 ng/dL 7.42  Triiodothyronine,Free,Serum     2.3 - 4.2 pg/mL 3.1  Microalbumin to creatinine ratio improved, but still elevated.  We will need to recheck after her diabetes improves.  TFTs are normal.  We will recheck at next visit.  We may need to decrease the methimazole dose at that time.  Carlus Pavlov, MD PhD [facility]

## 2019-09-19 NOTE — Addendum Note (Signed)
Addended by: Shelly Bombard on: 09/19/2019 03:31 PM   Modules accepted: Orders

## 2019-09-19 NOTE — Patient Instructions (Addendum)
Please start checking sugars  - get the Reli[employer] meter form [facility].  Please continue: - Glipizide 10 mg 2x a day - Levemir 50 mg at night  Try to start: - Trulicity 3 mg weekly  For now, start Ozempic 0.5 mg weekly.  Please let me now if you cannot start the Trulicity.  Please return in 1.5 months with your sugar log.

## 2019-09-19 NOTE — Addendum Note (Signed)
Addended by: Shelly Bombard on: 09/19/2019 03:47 PM   Modules accepted: Orders

## 2019-09-23 ENCOUNTER — Telehealth: Payer: Self-pay

## 2019-09-23 NOTE — Telephone Encounter (Signed)
Patient is approved for [facility] PAP for a 12 month period. Approval letter sent to scan.

## 2019-09-30 ENCOUNTER — Other Ambulatory Visit: Payer: Self-pay

## 2019-09-30 DIAGNOSIS — E782 Mixed hyperlipidemia: Secondary | ICD-10-CM

## 2019-11-06 ENCOUNTER — Ambulatory Visit: Payer: Medicare Other | Admitting: Internal Medicine

## 2019-11-23 ENCOUNTER — Other Ambulatory Visit: Payer: Self-pay | Admitting: Internal Medicine

## 2019-11-23 ENCOUNTER — Other Ambulatory Visit: Payer: Self-pay | Admitting: Cardiology

## 2019-11-23 DIAGNOSIS — I10 Essential (primary) hypertension: Secondary | ICD-10-CM

## 2019-11-25 ENCOUNTER — Other Ambulatory Visit: Payer: Self-pay

## 2019-11-25 ENCOUNTER — Ambulatory Visit (INDEPENDENT_AMBULATORY_CARE_PROVIDER_SITE_OTHER): Payer: Medicare Other | Admitting: Internal Medicine

## 2019-11-25 ENCOUNTER — Encounter: Payer: Self-pay | Admitting: Internal Medicine

## 2019-11-25 VITALS — BP 144/80 | HR 80 | Ht 64.0 in | Wt 198.8 lb

## 2019-11-25 DIAGNOSIS — E1169 Type 2 diabetes mellitus with other specified complication: Secondary | ICD-10-CM | POA: Diagnosis not present

## 2019-11-25 DIAGNOSIS — E1165 Type 2 diabetes mellitus with hyperglycemia: Secondary | ICD-10-CM

## 2019-11-25 DIAGNOSIS — E785 Hyperlipidemia, unspecified: Secondary | ICD-10-CM

## 2019-11-25 DIAGNOSIS — E0500 Thyrotoxicosis with diffuse goiter without thyrotoxic crisis or storm: Secondary | ICD-10-CM

## 2019-11-25 DIAGNOSIS — Z794 Long term (current) use of insulin: Secondary | ICD-10-CM

## 2019-11-25 LAB — POCT GLYCOSYLATED HEMOGLOBIN (HGB A1C): Hemoglobin A1C: 8.1 % — AB (ref 4.0–5.6)

## 2019-11-25 NOTE — Patient Instructions (Addendum)
Please continue: - Glipizide 10 mg 2x a day - Levemir 50 mg at night - Trulicity 3 mg weekly  Continue Methimazole 5 mg 2x a day.  Please return in 3 months with your sugar log.

## 2019-11-25 NOTE — Progress Notes (Signed)
Patient ID: Donna Booker, female   DOB: 20-Jul-1945, 74 y.o.   MRN: [id number]  This visit occurred during the SARS-CoV-2 public health emergency.  Safety protocols were in place, including screening questions prior to the visit, additional usage of staff PPE, and extensive cleaning of exam room while observing appropriate contact time as indicated for disinfecting solutions.   HPI: Donna Booker is a 74 y.o.-year-old female, presenting for f/u for DM2, dx in 1987, insulin-dependent since 2006, uncontrolled, with complications (PN, stage 2-3 CKD)and Graves ds. Last visit 2 months ago.  Graves ds  -Diagnosed during admission for A. fib with RVR in 05/2017  Reviewed her TFTs: Lab Results  Component Value Date   TSH 3.03 09/19/2019   TSH 0.46 06/05/2019   TSH <0.01 (L) 04/22/2019   TSH <0.01 (L) 03/03/2019   TSH <0.005 (L) 02/26/2019   TSH 10.31 (H) 12/04/2017   TSH <0.010 (L) 07/14/2017   TSH <0.01 (L) 07/06/2017   TSH <0.01 Repeated and verified X2. (L) 05/10/2017   TSH <0.010 (L) 04/16/2017   Lab Results  Component Value Date   FREET4 0.61 09/19/2019   FREET4 0.63 06/05/2019   FREET4 0.76 04/22/2019   FREET4 1.98 (H) 03/03/2019   FREET4 0.57 (L) 12/04/2017   FREET4 0.71 (L) 07/15/2017   FREET4 0.72 07/06/2017   FREET4 1.15 05/10/2017   FREET4 5.15 (H) 04/16/2017   Lab Results  Component Value Date   T3FREE 3.1 09/19/2019   T3FREE 3.2 06/05/2019   T3FREE 3.3 04/22/2019   T3FREE 4.4 (H) 03/03/2019   T3FREE 2.8 12/04/2017   T3FREE 2.1 07/15/2017   T3FREE 4.2 05/10/2017   T3FREE 18.5 (H) 04/16/2017   Her Graves' antibodies were elevated: Lab Results  Component Value Date   TSI 8.02 (H) 04/16/2017   She was started on methimazole 10 mg 2x a day and atenolol 50 mg 3x a day.Her TFTs were significantly improved >> we decreased the methimazole to 5 mg 2X a day, but then on 5 mg daily (? When changed - by PCP? -Patient cannot remember exactly when she started her  prescription for methimazole only mentioned to take it once a day...).  We stopped methimazole in 12/2017 as the TSH was 10.  Unfortunately, she did not return for labs afterwards until 02/2019, when her TSH was suppressed completely.  We restarted methimazole 5 mg twice a day 03/2018.  She also continues on Toprol-XL.  Pt denies: - feeling nodules in neck - hoarseness - dysphagia - choking - SOB with lying down  No FH of thyroid disease or thyroid cancer. No h/o radiation tx to head or neck.  No seaweed or kelp. No recent contrast studies. No herbal supplements. No Biotin use. No recent steroids use.   DM2: Reviewed HbA1c levels: Lab Results  Component Value Date   HGBA1C 8.6 (A) 09/19/2019   HGBA1C 7.3 (A) 06/05/2019   HGBA1C 8.8 (H) 02/26/2019   HGBA1C 13.0 04/26/2018   HGBA1C 14.1 (H) 03/19/2018   HGBA1C 13.1 (A) 12/28/2017   HGBA1C 9.8 (H) 07/14/2017   HGBA1C 8.0 02/01/2016   HGBA1C 8.3 (H) 10/20/2015   HGBA1C 8.4 07/29/2015   HGBA1C 10.0 04/27/2015   HGBA1C 9.5 (H) 12/29/2014   HGBA1C 9.1 (H) 09/01/2014   She is on: - Glipizide 10 mg 2x a day before meals - Levemir 40 >> 50 units at night  - Trulicity 1.5 mg weekly - started 12/2018 >> 3 mg weekly -increased 02/2019.  Unfortunately, this became  more expensive (400$ c/w 40$) -at last visit I suggested to apply for patient assistance >> but she did not apply >> started Trulicity 3 mg weekly 1 mo ago  (also given a sample of Ozempic 0.5 mg weekly) She was previously on a VGo patch pump.   She also tried Bydureon (02/2013-10/2014) >> made her hungry, did not lower sugars  At last visit, she was still not checking sugars >> she now checks 1x a day: - am: 156-184 >> 125-154 >> 86, 91-136, but 171-188 when forgot insulin - 2h after b'fast: n/c - before lunch: 221 (cantaloupe) >> n/c - 2h after lunch: n/c >> 200 >> n/c >> 165-185 >> n/c - before dinner: 119, 130-151 >> 120-125 >> ? >> 134-144 >> 108, 191 - 2h after dinner:  n/c >> 189 >> n/c >> 175-201 >> n/c - bedtime: 192, 265 >> 130-137 >> ? >> 207 - nighttime: n/c Lowest sugar was 94 >> 135 >> 125 >> 86; she has hypoglycemia awareness in the 70s. Highest sugar was 298 >> 240 >> 154 >> 207 (Chick fil-a).  Glucometer: AccuChek  -+ CKD, last BUN/creatinine:  Lab Results  Component Value Date   BUN 19 02/26/2019   CREATININE 1.29 (H) 02/26/2019  04/25/2018: Glucose 269, BUN/creatinine 18/1.05 Lab Results  Component Value Date   MICRALBCREAT 110.0 (H) 09/19/2019   MICRALBCREAT 192.6 (H) 06/05/2019  On olmesartan.  -+ HL; last set of lipids: Lab Results  Component Value Date   CHOL 149 06/05/2019   HDL 45.90 06/05/2019   LDLCALC 80 06/05/2019   LDLDIRECT 98.0 03/19/2018   TRIG 119.0 06/05/2019   CHOLHDL 3 06/05/2019  On Crestor 10. - last eye exam was in 10/2018: No DR -No numbness and tingling in her feet.   She previously had hyperkalemia, which we addressed in the past.  This normalized. - Possibly previously from ACE inhibitor, potassium supplementation +/-beta-blocker - After an initial K of 7.5 in 07/2017 >> K levels normalized after stopping ACEI and K supplements Lab Results  Component Value Date   K 4.9 02/26/2019   K 4.1 04/25/2018   K 3.9 03/19/2018   K 4.5 11/28/2017   K 4.5 11/01/2017  Of note, a cosyntropin stimulation test was normal and also aldosterone and renin were normal while in the hospital.   She had AKI on admission then.  Her son died in June 18, 2017-he had severe heart failure.  ROS: Constitutional: no weight gain/+ weight loss, no fatigue, no subjective hyperthermia, no subjective hypothermia Eyes: no blurry vision, no xerophthalmia ENT: no sore throat, + see HPI Cardiovascular: no CP/no SOB/no palpitations/no leg swelling Respiratory: no cough/no SOB/no wheezing Gastrointestinal: no N/no V/no D/no C/no acid reflux Musculoskeletal: no muscle aches/+ joint aches Skin: no rashes, no hair loss Neurological: no  tremors/no numbness/no tingling/no dizziness  I reviewed pt's medications, allergies, PMH, social hx, family hx, and changes were documented in the history of present illness. Otherwise, unchanged from my initial visit note.  Past Medical History:  Diagnosis Date   Afib (HCC)    Diabetes mellitus    Graves disease    Heart failure (HCC)    Hypertension    Hyperthyroidism    Past Surgical History:  Procedure Laterality Date   BREAST SURGERY     Social History   Social History   Marital Status: Married    Spouse Name: N/A   Number of Children: 1   Occupational History   Customer svc rep  Social History Main Topics   Smoking status: Never Smoker    Smokeless tobacco: Not on file   Alcohol Use: No   Drug Use: No   Current Outpatient Medications on File Prior to Visit  Medication Sig Dispense Refill   Abatacept (ORENCIA [place]) Inject into the skin every 30 (thirty) days.     allopurinol (ZYLOPRIM) 300 MG tablet Take 1 tablet (300 mg total) by mouth daily. To reduce uric acid and prevent gout. Take with colchicine for first 3 months 90 tablet 3   amLODipine (NORVASC) 10 MG tablet TAKE 1 TABLET BY MOUTH EVERY DAY 90 tablet 1   apixaban (ELIQUIS) 5 MG TABS tablet Take 1 tablet (5 mg total) by mouth 2 (two) times daily. 180 tablet 3   Apoaequorin (PREVAGEN PO) Take 1 capsule by mouth daily.     colchicine 0.6 MG tablet TAKE 1 TABLET BY MOUTH DAILY AS NEEDED FOR PAIN (Patient taking differently: daily. ) 90 tablet 1   Dulaglutide (TRULICITY) 3 MG/0.5ML SOPN Inject 3 mg into the skin once a week. 2 mL 3   esomeprazole (NEXIUM) 40 MG capsule Take 40 mg by mouth daily before breakfast.     furosemide (LASIX) 20 MG tablet TAKE 2 TABLETS (40 MG TOTAL) BY MOUTH 2 (TWO) TIMES DAILY. 360 tablet 1   gabapentin (NEURONTIN) 300 MG capsule TAKE 1-2 CAPSULES (300-600 MG TOTAL) BY MOUTH 3 (THREE) TIMES DAILY. 180 capsule 2   glipiZIDE (GLUCOTROL) 5 MG tablet TAKE 2 TABLETS  (10 MG TOTAL) BY MOUTH 2 (TWO) TIMES DAILY BEFORE A MEAL. 360 tablet 3   hydrALAZINE (APRESOLINE) 25 MG tablet TAKE 1 TABLET BY MOUTH THREE TIMES A DAY 90 tablet 3   LEVEMIR FLEXTOUCH 100 UNIT/ML FlexPen INJECT 50 UNITS INTO THE SKIN AT BEDTIME 15 mL 3   lidocaine (LIDODERM) 5 % Place 1 patch onto the skin daily. Remove & Discard patch within 12 hours or as directed by MD 30 patch 0   methimazole (TAPAZOLE) 5 MG tablet Take 1 tablet (5 mg total) by mouth 2 (two) times daily. 120 tablet 5   metoprolol succinate (TOPROL-XL) 100 MG 24 hr tablet Take 1 tablet (100 mg total) by mouth daily. Take with or immediately following a meal. 90 tablet 1   metoprolol succinate (TOPROL-XL) 50 MG 24 hr tablet TAKE 1 TABLET (50 MG TOTAL) BY MOUTH DAILY. TAKE WITH OR IMMEDIATELY FOLLOWING A MEAL. 90 tablet 2   pravastatin (PRAVACHOL) 40 MG tablet TAKE 1 TABLET (40 MG TOTAL) BY MOUTH DAILY AT 6 PM. 90 tablet 1   rosuvastatin (CRESTOR) 10 MG tablet TAKE 1 TABLET BY MOUTH EVERY DAY 90 tablet 1   Semaglutide,0.25 or 0.5MG /DOS, (OZEMPIC, 0.25 OR 0.5 MG/DOSE,) 2 MG/1.5ML SOPN As directed 1.5 mL 0   VOLTAREN 1 % GEL Apply 2 g topically 4 (four) times daily. (Patient taking differently: Apply 2 g topically as needed. ) 100 g 3   No current facility-administered medications on file prior to visit.   Allergies  Allergen Reactions   Tramadol Nausea And Vomiting    FH - see HPI  PE: BP (!) 144/80    Pulse 80    Ht  (1.626 m)    Wt 198 lb 12.8 oz (90.2 kg)    SpO2 97%    BMI 34.12 kg/m  Wt Readings from Last 3 Encounters:  11/25/19 198 lb 12.8 oz (90.2 kg)  09/19/19 204 lb (92.5 kg)  07/03/19 212 lb (96.2 kg)  Constitutional: overweight, in NAD Eyes: PERRLA, EOMI, no exophthalmos ENT: moist mucous membranes, no thyromegaly, no cervical lymphadenopathy Cardiovascular: RRR, No MRG, + periankle mild swelling Respiratory: CTA B Gastrointestinal: abdomen soft, NT, ND, BS+ Musculoskeletal: no  deformities, strength intact in all 4 Skin: moist, warm, no rashes Neurological: no tremor with outstretched hands, DTR normal in all 4  ASSESSMENT: 1. 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  Graves' disease  3.  HL  We previously also addressed her hyperkalemia: - in 07/2017, she had a critical value of potassium of 7.5.  She was admitted with high potassium since then >> Lasix, potassium supplementation, and lisinopril were stopped. - A cosyntropin stimulation test performed in the hospital was normal, ruling out primary adrenal insufficiency as a cause for hyperkalemia - Aldosterone and renin levels were normal during her last hospitalization - On her medication list, I do not see any possible culprits,  other than possibly propranolol - I suggested a referral to nephrology  if her potassium continues to remain elevated after stopping ACE inhibitor and potassium supplementation - Potassium normalized afterwards  PLAN:  1. Patient with longstanding, uncontrolled, type 2 diabetes, on oral medication with sulfonylurea, also, long-acting insulin and also not weekly GLP-1 receptor agonist.  Sugars improved on Victoza in the past and then again after switching to Trulicity, however, at last visit, she was not checking sugars as she did not have a meter.  I advised her to get the ReliOn glucometer from [place] and start checking.  Also, at that time, she came off Trulicity and she did not afford it.  She did not send patient application paperwork as advised in the previous visit.  I gave her an Ozempic pen and I strongly advised her to apply to patient assistance to obtain Trulicity 3 mg weekly. -At today's visit, she tells me that she was finally able to start Trulicity 3 mg weekly a month ago.  She is tolerating it well.  She is happy that she lost 6 pounds since last visit.  Sugars also starting to improve.  Reviewing her logs, sugars are mostly at  goal, but she had a period of 1 week recently when sugars are slightly higher in the morning.  Also, she has occasional spikes in the hypoglycemic range after dietary indiscretions (a low 200s CBG after [employer]).  With the rest of her blood sugars being at goal, I would not suggest to change her regimen for now. - I suggested to:  Patient Instructions  Please continue: - Glipizide 10 mg 2x a day - Levemir 50 mg at night - Trulicity 3 mg weekly  Please return in 3 months with your sugar log.   - we checked her HbA1c: 8.1% (better) - advised to check sugars at different times of the day - 2x a day, rotating check times - advised for yearly eye exams >> she is not UTD but has an appointment coming up next month - return to clinic in 3 months  2.  Graves' disease -Her hypothyroidism was diagnosed after she was admitted with A. fib with RVR and acute pulmonary edema in 04/2017 -We diagnosed Graves' disease based on elevated TSI antibodies and clinical course. -We started methimazole, however, we then stop this completely after TSH increased to 10.  We had to restart methimazole in 03/2019 at 5 mg twice a day as TSH returned suppressed. At last visit we did not change the dose.  TFTs were normal -  She initially had weight loss, diarrhea, but no tremors, palpitations, anxiety, heat intolerance.  These resolved after starting methimazole.   -We will recheck her TFTs at next visit  3. HL -Reviewed latest lipid panel from 06/2019: Tractions at goal with exception of a slightly high LDL (above our target of less than 70) Lab Results  Component Value Date   CHOL 149 06/05/2019   HDL 45.90 06/05/2019   LDLCALC 80 06/05/2019   LDLDIRECT 98.0 03/19/2018   TRIG 119.0 06/05/2019   CHOLHDL 3 06/05/2019  -Continues Crestor 10 without side effects  At this visit, she tells me that she may need to change endocrinologists due to her insurance.  She will look into this and let me know.  Carlus Pavlov, MD PhD [facility]

## 2019-11-25 NOTE — Addendum Note (Signed)
Addended by: Darene Lamer T on: 11/25/2019 11:06 AM   Modules accepted: Orders

## 2019-11-29 ENCOUNTER — Other Ambulatory Visit: Payer: Self-pay | Admitting: Cardiology

## 2019-11-29 DIAGNOSIS — E782 Mixed hyperlipidemia: Secondary | ICD-10-CM

## 2019-12-02 ENCOUNTER — Other Ambulatory Visit: Payer: Self-pay | Admitting: Cardiology

## 2019-12-02 DIAGNOSIS — R600 Localized edema: Secondary | ICD-10-CM

## 2019-12-02 MED ORDER — GABAPENTIN 300 MG PO CAPS
300.0000 mg | ORAL_CAPSULE | Freq: Three times a day (TID) | ORAL | 1 refills | Status: DC
Start: 2019-12-02 — End: 2019-12-03

## 2019-12-02 NOTE — Addendum Note (Signed)
Addended by: Merrilyn Puma on: 12/02/2019 04:06 PM   Modules accepted: Orders

## 2019-12-02 NOTE — Telephone Encounter (Signed)
Patient has moved to [place], requesting refill be sent to  [id number] [place], [place] - [address] AT [place] Phone:  [phone]  Fax:  [phone]

## 2019-12-02 NOTE — Telephone Encounter (Signed)
Refill printed, signed and faxed to [facility] [place], [place].

## 2019-12-03 MED ORDER — GABAPENTIN 300 MG PO CAPS: 300.0000 mg | ORAL_CAPSULE | Freq: Three times a day (TID) | ORAL | 1 refills | Status: DC

## 2019-12-03 NOTE — Addendum Note (Signed)
Addended by: Hillard Danker A on: 12/03/2019 09:46 AM   Modules accepted: Orders

## 2019-12-03 NOTE — Telephone Encounter (Signed)
Sent electronically to [facility]

## 2019-12-09 ENCOUNTER — Other Ambulatory Visit: Payer: Self-pay | Admitting: Cardiology

## 2019-12-09 DIAGNOSIS — I10 Essential (primary) hypertension: Secondary | ICD-10-CM

## 2019-12-11 ENCOUNTER — Telehealth: Payer: Self-pay | Admitting: *Deleted

## 2019-12-11 DIAGNOSIS — E1165 Type 2 diabetes mellitus with hyperglycemia: Secondary | ICD-10-CM

## 2019-12-11 DIAGNOSIS — E0500 Thyrotoxicosis with diffuse goiter without thyrotoxic crisis or storm: Secondary | ICD-10-CM

## 2019-12-11 MED ORDER — METHIMAZOLE 5 MG PO TABS: 5.0000 mg | ORAL_TABLET | Freq: Two times a day (BID) | ORAL | 0 refills | Status: DC

## 2019-12-11 NOTE — Telephone Encounter (Signed)
Called and advised patient RX was sent to preferred pharmacy.

## 2019-12-11 NOTE — Telephone Encounter (Signed)
Patient called requesting Methimazole 2 tablets twice daily in 90 day supply to be refilled.  [facility] [address] [facility] [place]   Patient- [phone]

## 2019-12-11 NOTE — Addendum Note (Signed)
Addended by: Kenyon Ana on: 12/11/2019 01:46 PM   Modules accepted: Orders

## 2020-01-04 ENCOUNTER — Other Ambulatory Visit: Payer: Self-pay | Admitting: Family Medicine

## 2020-01-04 ENCOUNTER — Other Ambulatory Visit: Payer: Self-pay | Admitting: Internal Medicine

## 2020-01-04 ENCOUNTER — Other Ambulatory Visit: Payer: Self-pay | Admitting: Cardiology

## 2020-01-05 ENCOUNTER — Ambulatory Visit: Payer: Medicare Other | Admitting: Cardiology

## 2020-01-05 NOTE — Progress Notes (Signed)
Rescheduled °

## 2020-01-05 NOTE — Telephone Encounter (Signed)
Patient has appt today.

## 2020-01-05 NOTE — Telephone Encounter (Signed)
Please advise 

## 2020-01-16 ENCOUNTER — Ambulatory Visit: Payer: Medicare Other | Admitting: Cardiology

## 2020-02-04 ENCOUNTER — Encounter: Payer: Self-pay | Admitting: Cardiology

## 2020-02-04 ENCOUNTER — Other Ambulatory Visit: Payer: Self-pay

## 2020-02-04 ENCOUNTER — Ambulatory Visit: Payer: Medicare Other | Admitting: Cardiology

## 2020-02-04 VITALS — BP 138/79 | HR 81 | Temp 97.7°F | Resp 16 | Ht 64.0 in | Wt 201.0 lb

## 2020-02-04 DIAGNOSIS — I10 Essential (primary) hypertension: Secondary | ICD-10-CM

## 2020-02-04 DIAGNOSIS — Z8639 Personal history of other endocrine, nutritional and metabolic disease: Secondary | ICD-10-CM

## 2020-02-04 DIAGNOSIS — E118 Type 2 diabetes mellitus with unspecified complications: Secondary | ICD-10-CM

## 2020-02-04 DIAGNOSIS — I480 Paroxysmal atrial fibrillation: Secondary | ICD-10-CM

## 2020-02-04 NOTE — Progress Notes (Signed)
Follow up visit  Subjective:   Donna Booker, female    DOB: [birth year], 75 y.o.   MRN: [id number]   Chief Complaint  Patient presents with  . Paroxysmal atrial fibrillation   . Hypertension  . Follow-up     HPI  75 year old African American female withf Graves' disease with hyperthyroidism, paroxysmal atrial fibrillation, mild PH, HFpEF (secondary to Afib), uncontrolled type II diabetes mellitus with diabetic polyneuropathy, CKD stage III.  Patient is doing well, denies chest pain, shortness of breath, palpitations, leg edema, orthopnea, PND, TIA/syncope. Blood pressure is fairly well controlled.    Current Outpatient Medications on File Prior to Visit  Medication Sig Dispense Refill  . Abatacept (ORENCIA [place]) Inject into the skin every 30 (thirty) days.    Marland Kitchen allopurinol (ZYLOPRIM) 300 MG tablet Take 1 tablet daily by mouth to prevent gout.  Contact Dr. Clovis Riley office to arrange for a lab to check uric acid. 30 tablet 0  . amLODipine (NORVASC) 10 MG tablet TAKE 1 TABLET BY MOUTH EVERY DAY 90 tablet 1  . apixaban (ELIQUIS) 5 MG TABS tablet Take 1 tablet (5 mg total) by mouth 2 (two) times daily. 180 tablet 3  . colchicine 0.6 MG tablet TAKE 1 TABLET BY MOUTH DAILY AS NEEDED FOR PAIN (Patient taking differently: daily.) 90 tablet 1  . Dulaglutide (TRULICITY) 3 NG/2.9BM SOPN Inject 3 mg into the skin once a week. 2 mL 3  . esomeprazole (NEXIUM) 40 MG capsule Take 40 mg by mouth daily before breakfast.    . furosemide (LASIX) 20 MG tablet TAKE 2 TABLETS (40 MG TOTAL) BY MOUTH 2 (TWO) TIMES DAILY. 360 tablet 1  . gabapentin (NEURONTIN) 300 MG capsule Take 1-2 capsules (300-600 mg total) by mouth 3 (three) times daily. 180 capsule 1  . glipiZIDE (GLUCOTROL) 5 MG tablet TAKE 2 TABLETS (10 MG TOTAL) BY MOUTH 2 (TWO) TIMES DAILY BEFORE A MEAL. 360 tablet 2  . hydrALAZINE (APRESOLINE) 25 MG tablet TAKE 1 TABLET BY MOUTH THREE TIMES A DAY 270 tablet 0  . LEVEMIR FLEXTOUCH 100 UNIT/ML  FlexPen INJECT 50 UNITS INTO THE SKIN AT BEDTIME 15 mL 2  . methimazole (TAPAZOLE) 5 MG tablet Take 1 tablet (5 mg total) by mouth 2 (two) times daily. 180 tablet 0  . metoprolol succinate (TOPROL-XL) 50 MG 24 hr tablet TAKE 1 TABLET (50 MG TOTAL) BY MOUTH DAILY. TAKE WITH OR IMMEDIATELY FOLLOWING A MEAL. 90 tablet 2  . rosuvastatin (CRESTOR) 10 MG tablet TAKE 1 TABLET BY MOUTH EVERY DAY 90 tablet 1  . VOLTAREN 1 % GEL Apply 2 g topically 4 (four) times daily. (Patient taking differently: Apply 2 g topically as needed.) 100 g 3   No current facility-administered medications on file prior to visit.    Cardiovascular studies:  EKG 02/04/2020: Sinus rhythm 79 bpm Borderline left atrial enlargement  Echocardiogram 07/26/2017: Left ventricle cavity is normal in size. Moderate concentric hypertrophy of the left ventricle. Normal global wall motion. Doppler evidence of grade I (impaired) diastolic dysfunction, elevated LAP. Calculated EF 64%. Left atrial cavity is moderately dilated at 4.7 cm. Mild calcification of the aortic valve annulus. Trileaflet aortic valve with no regurgitation noted. Mild tricuspid regurgitation. Mild pulmonary hypertension. IVC is normal with respiratory variation. Compared to the hospital echocardiogram 04/16/2017, moderate RA enlargement not present  and moderate pulmonary hypertension is now mild.  Renal artery duplex ([facility]) 05/23/2017: No renal artery stenosis  Recent labs: 11/25/2019: HbA1C 8.1%  09/2019: TSH  3.1  06/05/2019: HbA1C 7.3% Chol 149, TG 119, HDL 45, LDL 80 TSH 0.46 normal  04/25/2018: Glucose 269, BUN/Cr 18/1.1. EGFR 58. Na/K 142/4.1. Rest of the CMP normal H/H 12/37. MCV 75. Platelets 306 HbA1C 13% Chol 190, TG 217, HDL 46, LDL 98   Review of Systems  Cardiovascular: Negative for chest pain, dyspnea on exertion, leg swelling, palpitations and syncope.         Vitals:   02/04/20 1123  BP: 138/79  Pulse: 81  Resp: 16   Temp: 97.7 F (36.5 C)  SpO2: 99%     Body mass index is 34.5 kg/m. Filed Weights   02/04/20 1123  Weight: 201 lb (91.2 kg)     Objective:   Physical Exam Vitals and nursing note reviewed.  Constitutional:      Appearance: She is well-developed.  Neck:     Vascular: No JVD.  Cardiovascular:     Rate and Rhythm: Normal rate and regular rhythm.     Pulses: Intact distal pulses.     Heart sounds: Normal heart sounds. No murmur heard.   Pulmonary:     Effort: Pulmonary effort is normal.     Breath sounds: Normal breath sounds. No wheezing or rales.           Assessment & Recommendations:   75 year old African American female withf Graves' disease with hyperthyroidism, paroxysmal atrial fibrillation, mild PH, HFpEF (secondary to Afib), uncontrolled type II diabetes mellitus with diabetic polyneuropathy, CKD stage III  PAF: Maintaining sinus rhythm. CHA2DS2VASc score 4, annual stroke risk 5%. Continue anticaogualtion with eliquis 5 mg bid.  Hypertension: Well controlled.  She should not be on ACEi/ARB/aldosterone antagonist due to pevious episodes of hyperkalemia.  Grave's disease, DM:  F/u w/endocrinology  F/u in 1 year  Damaria Vachon Esther Hardy, MD [facility] Pager: [phone] Office: [phone] If no answer Cell [phone]

## 2020-02-15 ENCOUNTER — Other Ambulatory Visit: Payer: Self-pay | Admitting: Internal Medicine

## 2020-02-26 ENCOUNTER — Telehealth: Payer: Self-pay

## 2020-02-26 ENCOUNTER — Encounter: Payer: Self-pay | Admitting: Internal Medicine

## 2020-02-26 ENCOUNTER — Ambulatory Visit (INDEPENDENT_AMBULATORY_CARE_PROVIDER_SITE_OTHER): Payer: Medicare Other | Admitting: Internal Medicine

## 2020-02-26 ENCOUNTER — Other Ambulatory Visit: Payer: Self-pay

## 2020-02-26 VITALS — BP 130/82 | HR 73 | Temp 98.2°F | Resp 18 | Ht 64.0 in | Wt 197.6 lb

## 2020-02-26 DIAGNOSIS — Z0000 Encounter for general adult medical examination without abnormal findings: Secondary | ICD-10-CM

## 2020-02-26 DIAGNOSIS — Z8639 Personal history of other endocrine, nutritional and metabolic disease: Secondary | ICD-10-CM

## 2020-02-26 DIAGNOSIS — T50905A Adverse effect of unspecified drugs, medicaments and biological substances, initial encounter: Secondary | ICD-10-CM

## 2020-02-26 DIAGNOSIS — I10 Essential (primary) hypertension: Secondary | ICD-10-CM

## 2020-02-26 DIAGNOSIS — E875 Hyperkalemia: Secondary | ICD-10-CM | POA: Diagnosis not present

## 2020-02-26 DIAGNOSIS — E118 Type 2 diabetes mellitus with unspecified complications: Secondary | ICD-10-CM | POA: Diagnosis not present

## 2020-02-26 LAB — CBC
HCT: 37.3 % (ref 36.0–46.0)
Hemoglobin: 11.9 g/dL — ABNORMAL LOW (ref 12.0–15.0)
MCHC: 31.8 g/dL (ref 30.0–36.0)
MCV: 72.6 fl — ABNORMAL LOW (ref 78.0–100.0)
Platelets: 364 10*3/uL (ref 150.0–400.0)
RBC: 5.14 Mil/uL — ABNORMAL HIGH (ref 3.87–5.11)
RDW: 17.1 % — ABNORMAL HIGH (ref 11.5–15.5)
WBC: 13.7 10*3/uL — ABNORMAL HIGH (ref 4.0–10.5)

## 2020-02-26 LAB — LIPID PANEL
Cholesterol: 106 mg/dL (ref 0–200)
HDL: 38.2 mg/dL — ABNORMAL LOW (ref >39.00)
LDL Cholesterol: 49 mg/dL (ref 0–99)
NonHDL: 67.74
Total CHOL/HDL Ratio: 3
Triglycerides: 96 mg/dL (ref 0.0–149.0)
VLDL: 19.2 mg/dL (ref 0.0–40.0)

## 2020-02-26 LAB — COMPREHENSIVE METABOLIC PANEL
ALT: 10 U/L (ref 0–35)
AST: 13 U/L (ref 0–37)
Albumin: 4.1 g/dL (ref 3.5–5.2)
Alkaline Phosphatase: 68 U/L (ref 39–117)
BUN: 18 mg/dL (ref 6–23)
CO2: 30 mEq/L (ref 19–32)
Calcium: 9.3 mg/dL (ref 8.4–10.5)
Chloride: 102 mEq/L (ref 96–112)
Creatinine, Ser: 1.36 mg/dL — ABNORMAL HIGH (ref 0.40–1.20)
GFR: 38.88 mL/min — ABNORMAL LOW (ref >60.00)
Glucose, Bld: 48 mg/dL — CL (ref 70–99)
Potassium: 3.8 mEq/L (ref 3.5–5.1)
Sodium: 142 mEq/L (ref 135–145)
Total Bilirubin: 0.2 mg/dL (ref 0.2–1.2)
Total Protein: 7.7 g/dL (ref 6.0–8.3)

## 2020-02-26 LAB — TSH: TSH: 5.06 u[IU]/mL — ABNORMAL HIGH (ref 0.35–4.50)

## 2020-02-26 LAB — HEMOGLOBIN A1C: Hgb A1c MFr Bld: 6.8 % — ABNORMAL HIGH (ref 4.6–6.5)

## 2020-02-26 LAB — T3, FREE: T3, Free: 3 pg/mL (ref 2.3–4.2)

## 2020-02-26 LAB — T4, FREE: Free T4: 0.67 ng/dL (ref 0.60–1.60)

## 2020-02-26 NOTE — Patient Instructions (Addendum)
We are checking the labs today. You could get a tetanus shot and the second dose of the shingles vaccine.  Health Maintenance, Female Adopting a healthy lifestyle and getting preventive care are important in promoting health and wellness. Ask your health care provider about:  The right schedule for you to have regular tests and exams.  Things you can do on your own to prevent diseases and keep yourself healthy. What should I know about diet, weight, and exercise? Eat a healthy diet  Eat a diet that includes plenty of vegetables, fruits, low-fat dairy products, and lean protein.  Do not eat a lot of foods that are high in solid fats, added sugars, or sodium.   Maintain a healthy weight Body mass index (BMI) is used to identify weight problems. It estimates body fat based on height and weight. Your health care provider can help determine your BMI and help you achieve or maintain a healthy weight. Get regular exercise Get regular exercise. This is one of the most important things you can do for your health. Most adults should:  Exercise for at least 150 minutes each week. The exercise should increase your heart rate and make you sweat (moderate-intensity exercise).  Do strengthening exercises at least twice a week. This is in addition to the moderate-intensity exercise.  Spend less time sitting. Even light physical activity can be beneficial. Watch cholesterol and blood lipids Have your blood tested for lipids and cholesterol at 73 years of age, then have this test every 5 years. Have your cholesterol levels checked more often if:  Your lipid or cholesterol levels are high.  You are older than 73 years of age.  You are at high risk for heart disease. What should I know about cancer screening? Depending on your health history and family history, you may need to have cancer screening at various ages. This may include screening for:  Breast cancer.  Cervical cancer.  Colorectal  cancer.  Skin cancer.  Lung cancer. What should I know about heart disease, diabetes, and high blood pressure? Blood pressure and heart disease  High blood pressure causes heart disease and increases the risk of stroke. This is more likely to develop in people who have high blood pressure readings, are of African descent, or are overweight.  Have your blood pressure checked: ? Every 3-5 years if you are 64-69 years of age. ? Every year if you are 97 years old or older. Diabetes Have regular diabetes screenings. This checks your fasting blood sugar level. Have the screening done:  Once every three years after age 34 if you are at a normal weight and have a low risk for diabetes.  More often and at a younger age if you are overweight or have a high risk for diabetes. What should I know about preventing infection? Hepatitis B If you have a higher risk for hepatitis B, you should be screened for this virus. Talk with your health care provider to find out if you are at risk for hepatitis B infection. Hepatitis C Testing is recommended for:  Everyone born from [birth year] through 1963.  Anyone with known risk factors for hepatitis C. Sexually transmitted infections (STIs)  Get screened for STIs, including gonorrhea and chlamydia, if: ? You are sexually active and are younger than 73 years of age. ? You are older than 73 years of age and your health care provider tells you that you are at risk for this type of infection. ? Your sexual activity has  changed since you were last screened, and you are at increased risk for chlamydia or gonorrhea. Ask your health care provider if you are at risk.  Ask your health care provider about whether you are at high risk for HIV. Your health care provider may recommend a prescription medicine to help prevent HIV infection. If you choose to take medicine to prevent HIV, you should first get tested for HIV. You should then be tested every 3 months for as long as  you are taking the medicine. Pregnancy  If you are about to stop having your period (premenopausal) and you may become pregnant, seek counseling before you get pregnant.  Take 400 to 800 micrograms (mcg) of folic acid every day if you become pregnant.  Ask for birth control (contraception) if you want to prevent pregnancy. Osteoporosis and menopause Osteoporosis is a disease in which the bones lose minerals and strength with aging. This can result in bone fractures. If you are 52 years old or older, or if you are at risk for osteoporosis and fractures, ask your health care provider if you should:  Be screened for bone loss.  Take a calcium or vitamin D supplement to lower your risk of fractures.  Be given hormone replacement therapy (HRT) to treat symptoms of menopause. Follow these instructions at home: Lifestyle  Do not use any products that contain nicotine or tobacco, such as cigarettes, e-cigarettes, and chewing tobacco. If you need help quitting, ask your health care provider.  Do not use street drugs.  Do not share needles.  Ask your health care provider for help if you need support or information about quitting drugs. Alcohol use  Do not drink alcohol if: ? Your health care provider tells you not to drink. ? You are pregnant, may be pregnant, or are planning to become pregnant.  If you drink alcohol: ? Limit how much you use to 0-1 drink a day. ? Limit intake if you are breastfeeding.  Be aware of how much alcohol is in your drink. In the [place] one drink equals one 12 oz bottle of beer (355 mL), one 5 oz glass of wine (148 mL), or one 1 oz glass of hard liquor (44 mL). General instructions  Schedule regular health, dental, and eye exams.  Stay current with your vaccines.  Tell your health care provider if: ? You often feel depressed. ? You have ever been abused or do not feel safe at home. Summary  Adopting a healthy lifestyle and getting preventive care are  important in promoting health and wellness.  Follow your health care provider's instructions about healthy diet, exercising, and getting tested or screened for diseases.  Follow your health care provider's instructions on monitoring your cholesterol and blood pressure. This information is not intended to replace advice given to you by your health care provider. Make sure you discuss any questions you have with your health care provider. Document Revised: 12/12/2017 Document Reviewed: 12/12/2017 Elsevier Patient Education  2021 [employer].

## 2020-02-26 NOTE — Telephone Encounter (Signed)
Can you call patient and make sure she ate something since our visit, her sugar was low on the labs.

## 2020-02-26 NOTE — Progress Notes (Signed)
Subjective:   Patient ID: Donna Booker, female    DOB: February 10, 1945, 75 y.o.   MRN: [id number]  HPI Here for medicare wellness, no new complaints. Please see A/P for status and treatment of chronic medical problems.   HPI #2: Needs follow up diabetes (seeing endo for management and taking levemir and glipizide and trulicity, not on ACE-I/ARB, on statin, stable neuropathy), and blood pressure (taking amlodipine and metoprolol and lasix, denies chest pain or headaches, denies side effects), and thyroid (taking methimazole, seeing endo for management, requests labs today since we are doing labs).   Diet: DM since diabetic Physical activity: sedentary Depression/mood screen: negative Hearing: intact to whispered voice Visual acuity: grossly normal with lens, performs annual eye exam  ADLs: capable Fall risk: none Home safety: good Cognitive evaluation: intact to orientation, naming, recall and repetition EOL planning: adv directives discussed, in place  [employer] Visit from 03/07/2019 in [facility] Total Score 0      I have personally reviewed and have noted 1. The patient's medical and social history - reviewed today no changes 2. Their use of alcohol, tobacco or illicit drugs 3. Their current medications and supplements 4. The patient's functional ability including ADL's, fall risks, home safety risks and hearing or visual impairment. 5. Diet and physical activities 6. Evidence for depression or mood disorders 7. Care team reviewed and updated 8.  The patient is not on an opioid pain medication.  Patient Care Team: Myrlene Broker, MD as PCP - General (Internal Medicine) Past Medical History:  Diagnosis Date  . Afib (HCC)   . Diabetes mellitus   . Graves disease   . Heart failure (HCC)   . Hypertension   . Hyperthyroidism    Past Surgical History:  Procedure Laterality Date  . BREAST SURGERY     Family History  Problem  Relation Age of Onset  . Diabetes Mellitus II Mother   . Diabetes Mellitus II Father   . Hypertension Father   . Hyperlipidemia Father   . Heart disease Sister   . Stroke Sister   . Heart disease Brother   . Diabetes Brother   . Heart failure Brother    Review of Systems  Constitutional: Negative.   HENT: Negative.   Eyes: Negative.   Respiratory: Negative for cough, chest tightness and shortness of breath.   Cardiovascular: Negative for chest pain, palpitations and leg swelling.  Gastrointestinal: Negative for abdominal distention, abdominal pain, constipation, diarrhea, nausea and vomiting.  Musculoskeletal: Positive for arthralgias.  Skin: Negative.   Neurological: Positive for numbness.  Psychiatric/Behavioral: Negative.     Objective:  Physical Exam Constitutional:      Appearance: She is well-developed and well-nourished.  HENT:     Head: Normocephalic and atraumatic.  Eyes:     Extraocular Movements: EOM normal.  Cardiovascular:     Rate and Rhythm: Normal rate and regular rhythm.  Pulmonary:     Effort: Pulmonary effort is normal. No respiratory distress.     Breath sounds: Normal breath sounds. No wheezing or rales.  Abdominal:     General: Bowel sounds are normal. There is no distension.     Palpations: Abdomen is soft.     Tenderness: There is no abdominal tenderness. There is no rebound.  Musculoskeletal:        General: No edema.     Cervical back: Normal range of motion.  Skin:    General: Skin is warm  and dry.  Neurological:     Mental Status: She is alert and oriented to person, place, and time.     Coordination: Coordination normal.  Psychiatric:        Mood and Affect: Mood and affect normal.     Vitals:   02/26/20 1036  BP: 130/82  Pulse: 73  Resp: 18  Temp: 98.2 F (36.8 C)  TempSrc: Oral  SpO2: 97%  Weight: 197 lb 9.6 oz (89.6 kg)  Height: 5\' 4"  (1.626 m)   This visit occurred during the SARS-CoV-2 public health emergency.  Safety  protocols were in place, including screening questions prior to the visit, additional usage of staff PPE, and extensive cleaning of exam room while observing appropriate contact time as indicated for disinfecting solutions.   Assessment & Plan:

## 2020-02-26 NOTE — Telephone Encounter (Signed)
Spoke with the patient and she stated that before coming to our office she had not eaten anything. She has since ate lunch after she left our office.

## 2020-02-27 NOTE — Assessment & Plan Note (Signed)
BP at goal on amlodipine and lasix and metoprolol. Checking CMP and adjust as needed.

## 2020-02-27 NOTE — Assessment & Plan Note (Signed)
Requests labs as she has upcoming endo appointment and would prefer single blood draw.

## 2020-02-27 NOTE — Assessment & Plan Note (Signed)
Flu shot up to date. Covid-19 up to date including booster. Pneumonia complete. Shingrix counseled. Tetanus due declines. Colonoscopy due declines. Mammogram due 2023, pap smear aged out and dexa due 2024. Counseled about sun safety and mole surveillance. Counseled about the dangers of distracted driving. Given 10 year screening recommendations.

## 2020-02-27 NOTE — Assessment & Plan Note (Signed)
Foot exam done, checking HgA1c. Taking levemir, trulicity and glipizide. Seeing endo for med adjustment in near future.

## 2020-02-27 NOTE — Assessment & Plan Note (Signed)
Is recommended not to be on ACE-I/ARB.

## 2020-03-03 ENCOUNTER — Other Ambulatory Visit: Payer: Self-pay | Admitting: Internal Medicine

## 2020-03-03 DIAGNOSIS — E0500 Thyrotoxicosis with diffuse goiter without thyrotoxic crisis or storm: Secondary | ICD-10-CM

## 2020-03-04 ENCOUNTER — Other Ambulatory Visit: Payer: Self-pay | Admitting: Family Medicine

## 2020-03-04 NOTE — Telephone Encounter (Signed)
Rx refill request approved per Dr. Corey's orders. 

## 2020-03-05 ENCOUNTER — Ambulatory Visit (INDEPENDENT_AMBULATORY_CARE_PROVIDER_SITE_OTHER): Payer: Medicare Other | Admitting: Internal Medicine

## 2020-03-05 ENCOUNTER — Encounter: Payer: Self-pay | Admitting: Internal Medicine

## 2020-03-05 ENCOUNTER — Other Ambulatory Visit: Payer: Self-pay

## 2020-03-05 VITALS — BP 128/80 | HR 79 | Ht 64.0 in | Wt 205.4 lb

## 2020-03-05 DIAGNOSIS — E118 Type 2 diabetes mellitus with unspecified complications: Secondary | ICD-10-CM | POA: Diagnosis not present

## 2020-03-05 DIAGNOSIS — Z8639 Personal history of other endocrine, nutritional and metabolic disease: Secondary | ICD-10-CM | POA: Diagnosis not present

## 2020-03-05 DIAGNOSIS — E0500 Thyrotoxicosis with diffuse goiter without thyrotoxic crisis or storm: Secondary | ICD-10-CM | POA: Diagnosis not present

## 2020-03-05 MED ORDER — GLIPIZIDE 5 MG PO TABS: 5.0000 mg | ORAL_TABLET | Freq: Two times a day (BID) | ORAL | 2 refills | Status: DC

## 2020-03-05 MED ORDER — METHIMAZOLE 5 MG PO TABS: 5.0000 mg | ORAL_TABLET | Freq: Every day | ORAL | 0 refills | Status: DC

## 2020-03-05 NOTE — Patient Instructions (Addendum)
Please continue: - Levemir 50 mg at night - Trulicity 3 mg weekly  Please decrease: - Glipizide 5 mg 2x a day  Please decrease: - Methimazole to 5 mg daily  Please come back for labs in 5 to 6 weeks.  Please return in 3 months with your sugar log.

## 2020-03-05 NOTE — Progress Notes (Signed)
Patient ID: Donna Booker, female   DOB: 1945-02-23, 75 y.o.   MRN: [id number]  This visit occurred during the SARS-CoV-2 public health emergency.  Safety protocols were in place, including screening questions prior to the visit, additional usage of staff PPE, and extensive cleaning of exam room while observing appropriate contact time as indicated for disinfecting solutions.   HPI: Donna Booker is a 75 y.o.-year-old female, presenting for f/u for DM2, dx in 1987, insulin-dependent since 2006, uncontrolled, with complications (PN, stage 2-3 CKD)and Graves ds. Last visit 3.5 months ago.  She recently had a low blood sugar at 48 on BMP 02/26/2020 after an episode of gastroenteritis when she could not eat much.  She continued glipizide at that time  Graves ds  -Diagnosed during the admission for A. fib with RVR in 05/2017.  Reviewed her TFTs: Lab Results  Component Value Date   TSH 5.06 (H) 02/26/2020   TSH 3.03 09/19/2019   TSH 0.46 06/05/2019   TSH <0.01 (L) 04/22/2019   TSH <0.01 (L) 03/03/2019   TSH <0.005 (L) 02/26/2019   TSH 10.31 (H) 12/04/2017   TSH <0.010 (L) 07/14/2017   TSH <0.01 (L) 07/06/2017   TSH <0.01 Repeated and verified X2. (L) 05/10/2017   Lab Results  Component Value Date   FREET4 0.67 02/26/2020   FREET4 0.61 09/19/2019   FREET4 0.63 06/05/2019   FREET4 0.76 04/22/2019   FREET4 1.98 (H) 03/03/2019   FREET4 0.57 (L) 12/04/2017   FREET4 0.71 (L) 07/15/2017   FREET4 0.72 07/06/2017   FREET4 1.15 05/10/2017   FREET4 5.15 (H) 04/16/2017   Lab Results  Component Value Date   T3FREE 3.0 02/26/2020   T3FREE 3.1 09/19/2019   T3FREE 3.2 06/05/2019   T3FREE 3.3 04/22/2019   T3FREE 4.4 (H) 03/03/2019   T3FREE 2.8 12/04/2017   T3FREE 2.1 07/15/2017   T3FREE 4.2 05/10/2017   T3FREE 18.5 (H) 04/16/2017   Graves' antibodies were elevated Lab Results  Component Value Date   TSI 8.02 (H) 04/16/2017   She was started on methimazole 10 mg 2x a day and  atenolol 50 mg 3x a day.Her TFTs were significantly improved >> we decreased the methimazole to 5 mg 2X a day, but then on 5 mg daily (? When changed - by PCP? -Patient cannot remember exactly when she started her prescription for methimazole only mentioned to take it once a day...).  We stopped methimazole in 12/2017 as the TSH was 10.  Unfortunately, she did not return for labs afterwards until 02/2019, when her TSH was suppressed completely.  We restarted methimazole 5 mg twice a day 03/2018-she continues on this dose now.  She continues Toprol-XL.  Pt denies: - feeling nodules in neck - hoarseness - dysphagia - choking - SOB with lying down  No FH of thyroid disease or thyroid cancer. No h/o radiation tx to head or neck.  No herbal supplements. No Biotin use. No recent steroids use.   DM2: Reviewed HbA1c levels Lab Results  Component Value Date   HGBA1C 6.8 (H) 02/26/2020   HGBA1C 8.1 (A) 11/25/2019   HGBA1C 8.6 (A) 09/19/2019   HGBA1C 7.3 (A) 06/05/2019   HGBA1C 8.8 (H) 02/26/2019   HGBA1C 13.0 04/26/2018   HGBA1C 14.1 (H) 03/19/2018   HGBA1C 13.1 (A) 12/28/2017   HGBA1C 9.8 (H) 07/14/2017   HGBA1C 8.0 02/01/2016   HGBA1C 8.3 (H) 10/20/2015   HGBA1C 8.4 07/29/2015   HGBA1C 10.0 04/27/2015   HGBA1C 9.5 (H)  12/29/2014   HGBA1C 9.1 (H) 09/01/2014   She is on: - Glipizide 10 mg 2x a day before meals - Levemir 40 >> 50 units at night  - Trulicity 1.5 mg weekly - started 12/2018 >> 3 mg weekly  She was previously on a VGo patch pump.   She also tried Bydureon (02/2013-10/2014) >> made her hungry, did not lower sugars  She checks sugars once a day: - am: 156-184 >> 125-154 >> 86, 91-136, but 171-188 off insulin >> 73-120, 131, 168 (sweet potato the night before) - 2h after b'fast: n/c >> 167 - before lunch: 221 (cantaloupe) >> n/c - 2h after lunch: n/c >> 200 >> n/c >> 165-185 >> n/c - before dinner: 120-125 >> ? >> 134-144 >> 108, 191 >> n/c - 2h after dinner: n/c >>  189 >> n/c >> 175-201 >> n/c - bedtime: 192, 265 >> 130-137 >> ? >> 207 >> n/c - nighttime: n/c Lowest sugar was 125 >> 86 >> LO, 48 (gastroenteritis >> could not eat) 02/2020; she has hypoglycemia awareness in the 70s. Highest sugar was 298 ... >> 207 (Chick fil-a).  Glucometer: AccuChek  -+ CKD, last BUN/creatinine:  Lab Results  Component Value Date   BUN 18 02/26/2020   CREATININE 1.36 (H) 02/26/2020  04/25/2018: Glucose 269, BUN/creatinine 18/1.05 Lab Results  Component Value Date   MICRALBCREAT 110.0 (H) 09/19/2019   MICRALBCREAT 192.6 (H) 06/05/2019  Previously on olmesartan, now off (?)  -+ HL; last set of lipids: Lab Results  Component Value Date   CHOL 106 02/26/2020   HDL 38.20 (L) 02/26/2020   LDLCALC 49 02/26/2020   LDLDIRECT 98.0 03/19/2018   TRIG 96.0 02/26/2020   CHOLHDL 3 02/26/2020  On Crestor 10. - last eye exam was in 2021: No DR - no numbness and tingling in her feet.   She previously had hyperkalemia, which we addressed in the past.  This resolved. - Possibly 2/2 ACE inhibitor, potassium supplementation +/-beta-blocker - After an initial K of 7.5 in 07/2017 >> K levels normalized after stopping ACEI and K supplements Lab Results  Component Value Date   K 3.8 02/26/2020   K 4.9 02/26/2019   K 4.1 04/25/2018   K 3.9 03/19/2018   K 4.5 11/28/2017  Of note, a cosyntropin stimulation test was normal and also aldosterone and renin were normal while in the hospital.   She had AKI on admission then.  Her son died in 15-Jun-2017-he had severe heart failure.  ROS: Constitutional: no weight gain/no weight loss, no fatigue, no subjective hyperthermia, no subjective hypothermia Eyes: no blurry vision, no xerophthalmia ENT: no sore throat, + see HPI Cardiovascular: no CP/no SOB/no palpitations/no leg swelling Respiratory: no cough/no SOB/no wheezing Gastrointestinal: no N/no V/no D/no C/no acid reflux Musculoskeletal: no muscle aches/+ joint aches Skin:  no rashes, no hair loss Neurological: no tremors/no numbness/no tingling/no dizziness  I reviewed pt's medications, allergies, PMH, social hx, family hx, and changes were documented in the history of present illness. Otherwise, unchanged from my initial visit note.  Past Medical History:  Diagnosis Date  . Afib (HCC)   . Diabetes mellitus   . Graves disease   . Heart failure (HCC)   . Hypertension   . Hyperthyroidism    Past Surgical History:  Procedure Laterality Date  . BREAST SURGERY     Social History   Social History  . Marital Status: Married    Spouse Name: N/A  . Number of Children:  1   Occupational History  . 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 Smoking status: Never Smoker   . Smokeless tobacco: Not on file  . Alcohol Use: No  . Drug Use: No   Current Outpatient Medications on File Prior to Visit  Medication Sig Dispense Refill  . Abatacept (ORENCIA [place]) Inject into the skin every 30 (thirty) days.    Marland Kitchen allopurinol (ZYLOPRIM) 300 MG tablet TAKE 1 TABLET BY MOUTH DAILY TO PREVENT GOUT 30 tablet 0  . amLODipine (NORVASC) 10 MG tablet TAKE 1 TABLET BY MOUTH EVERY DAY 90 tablet 1  . apixaban (ELIQUIS) 5 MG TABS tablet Take 1 tablet (5 mg total) by mouth 2 (two) times daily. 180 tablet 3  . colchicine 0.6 MG tablet TAKE 1 TABLET BY MOUTH DAILY AS NEEDED FOR PAIN (Patient taking differently: daily.) 90 tablet 1  . Dulaglutide (TRULICITY) 3 MG/0.5ML SOPN Inject 3 mg into the skin once a week. 2 mL 3  . esomeprazole (NEXIUM) 40 MG capsule Take 40 mg by mouth daily before breakfast.    . furosemide (LASIX) 20 MG tablet TAKE 2 TABLETS (40 MG TOTAL) BY MOUTH 2 (TWO) TIMES DAILY. 360 tablet 1  . gabapentin (NEURONTIN) 300 MG capsule Take 1-2 capsules (300-600 mg total) by mouth 3 (three) times daily. 180 capsule 1  . glipiZIDE (GLUCOTROL) 5 MG tablet TAKE 2 TABLETS (10 MG TOTAL) BY MOUTH 2 (TWO) TIMES DAILY BEFORE A MEAL. 360 tablet 2  . hydrALAZINE (APRESOLINE)  25 MG tablet TAKE 1 TABLET BY MOUTH THREE TIMES A DAY 270 tablet 0  . LEVEMIR FLEXTOUCH 100 UNIT/ML FlexPen INJECT 50 UNITS INTO THE SKIN AT BEDTIME 15 mL 2  . methimazole (TAPAZOLE) 5 MG tablet TAKE 1 TABLET BY MOUTH TWICE A DAY 180 tablet 0  . metoprolol succinate (TOPROL-XL) 50 MG 24 hr tablet TAKE 1 TABLET (50 MG TOTAL) BY MOUTH DAILY. TAKE WITH OR IMMEDIATELY FOLLOWING A MEAL. 90 tablet 2  . rosuvastatin (CRESTOR) 10 MG tablet TAKE 1 TABLET BY MOUTH EVERY DAY 90 tablet 1  . VOLTAREN 1 % GEL Apply 2 g topically 4 (four) times daily. (Patient taking differently: Apply 2 g topically as needed.) 100 g 3   No current facility-administered medications on file prior to visit.   Allergies  Allergen Reactions  . Tramadol Nausea And Vomiting    FH - see HPI  PE: BP 128/80 (BP Location: Right Arm, Patient Position: Sitting, Cuff Size: Large)   Pulse 79   Ht 5\' 4"  (1.626 m)   Wt 205 lb 6.4 oz (93.2 kg)   SpO2 98%   BMI 35.26 kg/m  Wt Readings from Last 3 Encounters:  03/05/20 205 lb 6.4 oz (93.2 kg)  02/26/20 197 lb 9.6 oz (89.6 kg)  02/04/20 201 lb (91.2 kg)   Constitutional: overweight, in NAD Eyes: PERRLA, EOMI, no exophthalmos ENT: moist mucous membranes, no thyromegaly, no cervical lymphadenopathy Cardiovascular: RRR, No MRG Respiratory: CTA B Gastrointestinal: abdomen soft, NT, ND, BS+ Musculoskeletal: no deformities, strength intact in all 4 Skin: moist, warm, no rashes Neurological: no tremor with outstretched hands, DTR normal in all 4  ASSESSMENT: 1. 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  Graves' disease  3.  HL  We previously also addressed her hyperkalemia: - in 07/2017, she had a critical value of potassium of 7.5.  She was admitted with high potassium since then >> Lasix, potassium supplementation, and  lisinopril were stopped. - A cosyntropin stimulation test performed in the hospital was normal, ruling out primary  adrenal insufficiency as a cause for hyperkalemia - Aldosterone and renin levels were normal during her last hospitalization - On her medication list, I do not see any possible culprits,  other than possibly propranolol - I suggested a referral to nephrology  if her potassium continues to remain elevated after stopping ACE inhibitor and potassium supplementation - Potassium normalized afterwards  PLAN:  1. Patient with longstanding, uncontrolled, type 2 diabetes, on oral antidiabetic regimen with sulfonylurea, and also daily long-acting insulin and weekly GLP-1 receptor agonist with improved control after starting Trulicity.  At last visit, sugars are mostly at goal, but occasionally slightly higher in the morning and with occasional spikes in the hyperglycemic range after dietary indiscretions.  However, due to the improvement in her sugars, we discussed about improving diet but we did not change the regimen at that time.  HbA1c was 8.1%, improved, however, patient had a recent HbA1c obtained last month which was excellent, at 6.8%. -At this visit, per review of her sugar log, sugars are mostly at goal in the morning, even on the low side and she is not checking later in the day.  I did advise him to start checking, but for now I advised her to decrease her glipizide to 5 mg twice a day.  She had a recent hypoglycemic episode when her sugars dropped to 48 after gastroenteritis when she could not eat much but continues to take the glipizide.  I advised her to only take glipizide if she is eating. -For now, we will continue the rest of her regimen. - I suggested to:  Patient Instructions  Please continue: - Levemir 50 mg at night - Trulicity 3 mg weekly  Please decrease: - Glipizide 5 mg 2x a day  Please decrease: - Methimazole to 5 mg daily  Please come back for labs in 5 to 6 weeks.  Please return in 3 months with your sugar log.   - advised to check sugars at different times of the day -  2x a day, rotating check times - advised for yearly eye exams >> she is UTD - return to clinic in 3 months  2.  Graves' disease -Her hyperthyroidism was diagnosed after she was admitted with A. fib with RVR and acute pulmonary edema in 04/2017.  -We diagnosed Graves' disease based on elevated TSI antibodies and the clinical course -We started methimazole, however, we then stop this completely after TSH increased to 10.  We had to restart methimazole in 03/2019 at 5 mg twice a day as TSH returned suppressed.  TFTs normalized on this dose.  She now completed 5 mg twice a day. -She initially had weight loss, diarrhea, but no tremors, palpitations, anxiety, heat intolerance.  These resolved after starting methimazole.   -We reviewed together her recent TFTs from last month.  TSH is slightly high.  Will back off methimazole to 5 mg daily and recheck her tests in 1.5 months.  3. HL -Reviewed latest lipid panel from 02/2020: LDL at goal, improved, HDL slightly low, triglycerides at goal: Lab Results  Component Value Date   CHOL 106 02/26/2020   HDL 38.20 (L) 02/26/2020   LDLCALC 49 02/26/2020   LDLDIRECT 98.0 03/19/2018   TRIG 96.0 02/26/2020   CHOLHDL 3 02/26/2020  -Continues Crestor 10 without side effects   Carlus Pavlov, MD PhD [facility]

## 2020-03-14 ENCOUNTER — Other Ambulatory Visit: Payer: Self-pay | Admitting: Internal Medicine

## 2020-03-14 ENCOUNTER — Other Ambulatory Visit: Payer: Self-pay | Admitting: Family Medicine

## 2020-03-14 ENCOUNTER — Other Ambulatory Visit: Payer: Self-pay | Admitting: Cardiology

## 2020-03-14 DIAGNOSIS — I10 Essential (primary) hypertension: Secondary | ICD-10-CM

## 2020-03-14 DIAGNOSIS — E0500 Thyrotoxicosis with diffuse goiter without thyrotoxic crisis or storm: Secondary | ICD-10-CM

## 2020-03-15 NOTE — Telephone Encounter (Signed)
Rx refill request approved per Dr. Corey's orders. 

## 2020-04-07 ENCOUNTER — Telehealth: Payer: Self-pay

## 2020-04-07 NOTE — Telephone Encounter (Signed)
If not painful, monitor for now. Depending on the site, t may move and/or change colors. If any pain, will be happy to see.  Thanks MJP

## 2020-04-07 NOTE — Telephone Encounter (Signed)
Patient called stating that she was informed by the pharmacists about bruising so she has been reading on the side effects of Eliquis. Patient stated that she noticed a big bruise on her leg and wants to know if she needs to be seen for it. Please advise.

## 2020-04-09 NOTE — Telephone Encounter (Signed)
Called and spoke with patient, she is going to monitor any bruising, she is ok for now, no pain. Will call back if she does have any pain.

## 2020-04-15 ENCOUNTER — Telehealth: Payer: Self-pay

## 2020-04-15 ENCOUNTER — Ambulatory Visit: Payer: Medicare Other | Admitting: Cardiology

## 2020-04-15 ENCOUNTER — Other Ambulatory Visit: Payer: Self-pay

## 2020-04-15 VITALS — BP 143/71 | HR 84 | Temp 98.6°F | Wt 205.0 lb

## 2020-04-15 DIAGNOSIS — T148XXA Other injury of unspecified body region, initial encounter: Secondary | ICD-10-CM

## 2020-04-15 NOTE — Progress Notes (Signed)
Follow up visit  Subjective:   Donna Booker, female    DOB: 1945-04-29, 75 y.o.   MRN: [id number]   Chief Complaint  Patient presents with  . Bleeding/Bruising     HPI  75 year old African American female withf Graves' disease with hyperthyroidism, paroxysmal atrial fibrillation, mild PH, HFpEF (secondary to Afib), uncontrolled type II diabetes mellitus with diabetic polyneuropathy, CKD stage III.  Patient made an urgent appointment today due to bruising on her leg.  She denies any other complaints symptoms.  She does not recollect any injury to her leg or thigh.  Current Outpatient Medications on File Prior to Visit  Medication Sig Dispense Refill  . Abatacept (ORENCIA [place]) Inject into the skin every 30 (thirty) days.    Marland Kitchen allopurinol (ZYLOPRIM) 300 MG tablet TAKE 1 TABLET BY MOUTH DAILY TO PREVENT GOUT 30 tablet 0  . amLODipine (NORVASC) 10 MG tablet TAKE 1 TABLET BY MOUTH EVERY DAY 90 tablet 1  . apixaban (ELIQUIS) 5 MG TABS tablet Take 1 tablet (5 mg total) by mouth 2 (two) times daily. 180 tablet 3  . colchicine 0.6 MG tablet TAKE 1 TABLET BY MOUTH DAILY AS NEEDED FOR PAIN (Patient taking differently: daily.) 90 tablet 1  . Dulaglutide (TRULICITY) 3 EM/7.5QG SOPN Inject 3 mg into the skin once a week. 2 mL 3  . esomeprazole (NEXIUM) 40 MG capsule Take 40 mg by mouth daily before breakfast.    . furosemide (LASIX) 20 MG tablet TAKE 2 TABLETS (40 MG TOTAL) BY MOUTH 2 (TWO) TIMES DAILY. 360 tablet 1  . gabapentin (NEURONTIN) 300 MG capsule Take 1-2 capsules (300-600 mg total) by mouth 3 (three) times daily. 180 capsule 1  . hydrALAZINE (APRESOLINE) 25 MG tablet TAKE 1 TABLET BY MOUTH THREE TIMES A DAY 270 tablet 0  . LEVEMIR FLEXTOUCH 100 UNIT/ML FlexPen INJECT 50 UNITS INTO THE SKIN AT BEDTIME 15 mL 2  . methimazole (TAPAZOLE) 5 MG tablet TAKE 1 TABLET BY MOUTH TWICE A DAY (Patient taking differently: Take 5 mg by mouth daily.) 180 tablet 0  . metoprolol succinate (TOPROL-XL)  50 MG 24 hr tablet TAKE 1 TABLET (50 MG TOTAL) BY MOUTH DAILY. TAKE WITH OR IMMEDIATELY FOLLOWING A MEAL. (Patient taking differently: Take 100 mg by mouth daily. Take with or immediately following a meal.) 90 tablet 2  . rosuvastatin (CRESTOR) 10 MG tablet TAKE 1 TABLET BY MOUTH EVERY DAY 90 tablet 1  . VOLTAREN 1 % GEL Apply 2 g topically 4 (four) times daily. (Patient taking differently: Apply 2 g topically as needed.) 100 g 3  . glipiZIDE (GLUCOTROL) 5 MG tablet TAKE 2 TABLETS (10 MG TOTAL) BY MOUTH 2 (TWO) TIMES DAILY BEFORE A MEAL. 360 tablet 2   No current facility-administered medications on file prior to visit.    Cardiovascular studies:  EKG 02/04/2020: Sinus rhythm 79 bpm Borderline left atrial enlargement  Echocardiogram 07/26/2017: Left ventricle cavity is normal in size. Moderate concentric hypertrophy of the left ventricle. Normal global wall motion. Doppler evidence of grade I (impaired) diastolic dysfunction, elevated LAP. Calculated EF 64%. Left atrial cavity is moderately dilated at 4.7 cm. Mild calcification of the aortic valve annulus. Trileaflet aortic valve with no regurgitation noted. Mild tricuspid regurgitation. Mild pulmonary hypertension. IVC is normal with respiratory variation. Compared to the hospital echocardiogram 04/16/2017, moderate RA enlargement not present  and moderate pulmonary hypertension is now mild.  Renal artery duplex ([facility]) 05/23/2017: No renal artery stenosis  Recent labs: 11/25/2019:  HbA1C 8.1%  09/2019: TSH 3.1  06/05/2019: HbA1C 7.3% Chol 149, TG 119, HDL 45, LDL 80 TSH 0.46 normal  04/25/2018: Glucose 269, BUN/Cr 18/1.1. EGFR 58. Na/K 142/4.1. Rest of the CMP normal H/H 12/37. MCV 75. Platelets 306 HbA1C 13% Chol 190, TG 217, HDL 46, LDL 98   Review of Systems  Skin:       Bruising on right thigh         Vitals:   04/15/20 1427  BP: (!) 143/71  Pulse: 84  Temp: 98.6 F (37 C)  SpO2: 97%     Body mass  index is 35.19 kg/m. Filed Weights   04/15/20 1427  Weight: 205 lb (93 kg)     Objective:   Physical Exam Skin:    Findings: Bruising present.          Comments: 2 tablets has bruises on right thigh.  Mild ecchymosis, no significant hematoma.  Mild tenderness.  Neurological:     Sensory: Sensory deficit:            Assessment & Recommendations:   75 year old African American female withf Graves' disease with hyperthyroidism, paroxysmal atrial fibrillation, mild PH, HFpEF (secondary to Afib), uncontrolled type II diabetes mellitus with diabetic polyneuropathy, CKD stage III  Bruising: Expected but nonthreatening side effect of Eliquis use.  No other signs/symptoms of internal bleeding.  Reassured the patient.  Continue use of Eliquis.  F/u in 02/2021   Nigel Mormon, MD [facility] Pager: [phone] Office: [phone] If no answer Cell [phone]

## 2020-04-15 NOTE — Telephone Encounter (Signed)
Patient called and stated that she has gotten another bruise on her thigh of her right leg. She stated that this does hurt and she is concerned about the pain and wants to make sure that is not endangering her life. Please advise.

## 2020-04-15 NOTE — Telephone Encounter (Signed)
Please ask if she would like to be seen today 2 or 2:30  Thanks MJP

## 2020-04-15 NOTE — Telephone Encounter (Signed)
Called and spoke with patient, she will be here @2 :30.

## 2020-04-16 ENCOUNTER — Encounter: Payer: Self-pay | Admitting: Cardiology

## 2020-04-16 ENCOUNTER — Other Ambulatory Visit: Payer: Medicare Other

## 2020-04-16 DIAGNOSIS — T148XXA Other injury of unspecified body region, initial encounter: Secondary | ICD-10-CM | POA: Insufficient documentation

## 2020-04-18 ENCOUNTER — Other Ambulatory Visit: Payer: Self-pay | Admitting: Family Medicine

## 2020-04-19 ENCOUNTER — Other Ambulatory Visit: Payer: Self-pay | Admitting: Internal Medicine

## 2020-04-19 NOTE — Telephone Encounter (Signed)
Please advise 

## 2020-04-26 ENCOUNTER — Other Ambulatory Visit (INDEPENDENT_AMBULATORY_CARE_PROVIDER_SITE_OTHER): Payer: Medicare Other

## 2020-04-26 ENCOUNTER — Other Ambulatory Visit: Payer: Medicare Other

## 2020-04-26 ENCOUNTER — Other Ambulatory Visit: Payer: Self-pay

## 2020-04-26 DIAGNOSIS — E0500 Thyrotoxicosis with diffuse goiter without thyrotoxic crisis or storm: Secondary | ICD-10-CM | POA: Diagnosis not present

## 2020-04-26 LAB — T3, FREE: T3, Free: 3 pg/mL (ref 2.3–4.2)

## 2020-04-26 LAB — TSH: TSH: 4.9 u[IU]/mL — ABNORMAL HIGH (ref 0.35–4.50)

## 2020-04-26 LAB — T4, FREE: Free T4: 0.71 ng/dL (ref 0.60–1.60)

## 2020-04-27 ENCOUNTER — Telehealth: Payer: Self-pay | Admitting: Internal Medicine

## 2020-04-27 NOTE — Telephone Encounter (Signed)
-----   Message from Carlus Pavlov, MD sent at 04/27/2020  8:36 AM EDT ----- Can you please call pt:  Her thyroid tests are better, the TSH is now only slightly above target.  I would suggest to decrease the dose of methimazole to 2.5 mg (half a tablet) daily and we will repeat the tests when she comes back in June.  Can you please change the dose of methimazole on her medication list?

## 2020-04-27 NOTE — Telephone Encounter (Signed)
Left VM for pt to call back for lab results Have a good day Donna Booker J

## 2020-04-28 ENCOUNTER — Ambulatory Visit: Payer: Medicare Other | Admitting: Cardiology

## 2020-04-28 NOTE — Telephone Encounter (Signed)
Called pt informed of lab results and informed pt of change to methimazole to 2.5mg  daily. Pt understands

## 2020-05-04 ENCOUNTER — Telehealth: Payer: Self-pay | Admitting: Internal Medicine

## 2020-05-04 NOTE — Progress Notes (Signed)
*  Patient and I spoke about Deductible and she is okay aware, and okay to pay.*   Chronic Care Management   Note  05/04/2020 Name: Donna Booker MRN: [id number] DOB: 06/04/1945  Donna Booker is a 75 y.o. year old female who is a primary care patient of Myrlene Broker, MD. I reached out to Daisy Lazar by phone today in response to a referral sent by Donna Booker's PCP, Myrlene Broker, MD.   Donna Booker was given information about Chronic Care Management services today including:  1. CCM service includes personalized support from designated clinical staff supervised by her physician, including individualized plan of care and coordination with other care providers 2. 24/7 contact phone numbers for assistance for urgent and routine care needs. 3. Service will only be billed when office clinical staff spend 20 minutes or more in a month to coordinate care. 4. Only one practitioner may furnish and bill the service in a calendar month. 5. The patient may stop CCM services at any time (effective at the end of the month) by phone call to the office staff.   Patient agreed to services and verbal consent obtained.   Follow up plan:   Donna Booker

## 2020-05-13 ENCOUNTER — Other Ambulatory Visit: Payer: Self-pay | Admitting: Family Medicine

## 2020-05-13 NOTE — Telephone Encounter (Signed)
Rx refill request approved per Dr. Corey's orders. 

## 2020-05-23 ENCOUNTER — Other Ambulatory Visit: Payer: Self-pay | Admitting: Internal Medicine

## 2020-06-04 ENCOUNTER — Other Ambulatory Visit: Payer: Self-pay | Admitting: Family Medicine

## 2020-06-07 NOTE — Telephone Encounter (Signed)
Rx refill request approved per Dr. Corey's orders. 

## 2020-06-10 ENCOUNTER — Telehealth: Payer: Self-pay | Admitting: Pharmacist

## 2020-06-10 NOTE — Progress Notes (Signed)
Chronic Care Management Pharmacy Assistant   Name: Donna Booker  MRN: [id number] DOB: 1945/07/16  Reason for Encounter: Initial Questions Appointment: OV 06/14/20 @ 11 am   Recent office visits:  02/26/20 Okey Dupre (PCP) - Annual Exam. No med changes. F/u 6 mos.  Recent consult visits:  04/15/20 Patwardhan (Cardiology) - Bruising. No med changes.  03/05/20 Gherghe (Endocrine) -Type 2 Diabetes. Decrease glipizide & methimazole. F/u 3 mos.  02/04/20 Patwardhan (Cardiology) - F/u Paroxysmal atrial fib. D/c Apoaequorin & Lidocaine. F/u 1 yr.  01/05/20 Patwardhan (Cardiology) - Paroxysmal atrial fib. No med changes.   Hospital visits:  None in previous 6 months  Medications: Outpatient Encounter Medications as of 06/10/2020  Medication Sig   Abatacept (ORENCIA [place]) Inject into the skin every 30 (thirty) days.   allopurinol (ZYLOPRIM) 300 MG tablet TAKE 1 TABLET BY MOUTH EVERY DAY   amLODipine (NORVASC) 10 MG tablet TAKE 1 TABLET BY MOUTH EVERY DAY   apixaban (ELIQUIS) 5 MG TABS tablet Take 1 tablet (5 mg total) by mouth 2 (two) times daily.   colchicine 0.6 MG tablet TAKE 1 TABLET BY MOUTH DAILY AS NEEDED FOR PAIN (Patient taking differently: daily.)   Dulaglutide (TRULICITY) 3 MG/0.5ML SOPN Inject 3 mg into the skin once a week.   esomeprazole (NEXIUM) 40 MG capsule Take 40 mg by mouth daily before breakfast.   furosemide (LASIX) 20 MG tablet TAKE 2 TABLETS (40 MG TOTAL) BY MOUTH 2 (TWO) TIMES DAILY.   gabapentin (NEURONTIN) 300 MG capsule TAKE 1-2 CAPSULES (300-600 MG TOTAL) BY MOUTH 3 (THREE) TIMES DAILY.   glipiZIDE (GLUCOTROL) 5 MG tablet TAKE 2 TABLETS (10 MG TOTAL) BY MOUTH 2 (TWO) TIMES DAILY BEFORE A MEAL.   hydrALAZINE (APRESOLINE) 25 MG tablet TAKE 1 TABLET BY MOUTH THREE TIMES A DAY   LEVEMIR FLEXTOUCH 100 UNIT/ML FlexPen INJECT 50 UNITS INTO THE SKIN AT BEDTIME   methimazole (TAPAZOLE) 5 MG tablet TAKE 1 TABLET BY MOUTH TWICE A DAY (Patient taking differently: Take 2.5 mg by  mouth daily.)   metoprolol succinate (TOPROL-XL) 50 MG 24 hr tablet TAKE 1 TABLET (50 MG TOTAL) BY MOUTH DAILY. TAKE WITH OR IMMEDIATELY FOLLOWING A MEAL. (Patient taking differently: Take 100 mg by mouth daily. Take with or immediately following a meal.)   rosuvastatin (CRESTOR) 10 MG tablet TAKE 1 TABLET BY MOUTH EVERY DAY   VOLTAREN 1 % GEL Apply 2 g topically 4 (four) times daily. (Patient taking differently: Apply 2 g topically as needed.)   No facility-administered encounter medications on file as of 06/10/2020.    Have you seen any other providers since your last visit?  Patient states she seen her cardiologists and endocrinologist recently.   Any changes in your medications or health? Patient states on yesterday she discovered a knot on throat that's sore, and would like to be referred to ENT.  Any side effects from any medications?  Patient states no side effects.  Do you have an symptoms or problems not managed by your medications?  Patient states no symptoms or problems.  Any concerns about your health right now?  Patient states no concerns right now.  Has your provider asked that you check blood pressure, blood sugar, or follow special diet at home? yes Patient checks her BP once a week, and the readings go to her cardiologists office. Her last reading was 140/79. Patient does not follow a special diet but checks her glucose every day. Her last readings are as follows: 6/8 -  80 6/7 - 105 6/5 - 73 6/4 - 94  Do you get any type of exercise on a regular basis?  Patient states no but she's going to start walking to the end of the street and back soon.  Can you think of a goal you would like to reach for your health?  Patient states she would like to maintain her BP and diabetes and loose weight.  Do you have any problems getting your medications?  Patient states she use mail order and has no problems.  Is there anything that you would like to discuss during the  appointment?  Patient states no concerns at this time.  Please bring medications and supplements to appointment   Star Rating Drugs: Rosuvastatin - last fill 03/13/20 90D  Benedict Needy, RMA Clinical Pharmacists Assistant [phone]  Time Spent: 31

## 2020-06-13 ENCOUNTER — Other Ambulatory Visit: Payer: Self-pay | Admitting: Cardiology

## 2020-06-13 DIAGNOSIS — R600 Localized edema: Secondary | ICD-10-CM

## 2020-06-13 DIAGNOSIS — E782 Mixed hyperlipidemia: Secondary | ICD-10-CM

## 2020-06-14 ENCOUNTER — Other Ambulatory Visit: Payer: Self-pay

## 2020-06-14 ENCOUNTER — Ambulatory Visit (INDEPENDENT_AMBULATORY_CARE_PROVIDER_SITE_OTHER): Payer: Medicare Other | Admitting: Pharmacist

## 2020-06-14 ENCOUNTER — Encounter: Payer: Self-pay | Admitting: Internal Medicine

## 2020-06-14 VITALS — Wt 208.4 lb

## 2020-06-14 DIAGNOSIS — I5032 Chronic diastolic (congestive) heart failure: Secondary | ICD-10-CM | POA: Diagnosis not present

## 2020-06-14 DIAGNOSIS — I10 Essential (primary) hypertension: Secondary | ICD-10-CM | POA: Diagnosis not present

## 2020-06-14 DIAGNOSIS — I480 Paroxysmal atrial fibrillation: Secondary | ICD-10-CM

## 2020-06-14 DIAGNOSIS — E0500 Thyrotoxicosis with diffuse goiter without thyrotoxic crisis or storm: Secondary | ICD-10-CM

## 2020-06-14 DIAGNOSIS — E118 Type 2 diabetes mellitus with unspecified complications: Secondary | ICD-10-CM

## 2020-06-14 DIAGNOSIS — E1169 Type 2 diabetes mellitus with other specified complication: Secondary | ICD-10-CM

## 2020-06-14 DIAGNOSIS — E785 Hyperlipidemia, unspecified: Secondary | ICD-10-CM

## 2020-06-14 NOTE — Progress Notes (Signed)
Chronic Care Management Pharmacy Note  06/16/2020 Name:  Donna Booker MRN:  [id number] DOB:  1945-12-05  Summary: -Pt reports compliance with meds. She was started on Vitamin D 50,000 IU weekly per nephrology in March.  -Updated Vaccine history based on pharmacy administrations -Pt is not taking any gout meds and last flare was 04/2019 -Pt c/o knot in neck starting a few days ago, initially painful but now improving  Recommendations/Changes made from today's visit: -Low purine diet; may restart allopurinol if desired in future -Advised f/u with PCP or endocrine regarding knot in neck -Advised TDAP booster at local pharmacy   Subjective: Donna Booker is an 75 y.o. year old female who is a primary patient of Hoyt Koch, MD.  The CCM team was consulted for assistance with disease management and care coordination needs.    Engaged with patient face to face for initial visit in response to provider referral for pharmacy case management and/or care coordination services.   Consent to Services:  The patient was given the following information about Chronic Care Management services today, agreed to services, and gave verbal consent: 1. CCM service includes personalized support from designated clinical staff supervised by the primary care provider, including individualized plan of care and coordination with other care providers 2. 24/7 contact phone numbers for assistance for urgent and routine care needs. 3. Service will only be billed when office clinical staff spend 20 minutes or more in a month to coordinate care. 4. Only one practitioner may furnish and bill the service in a calendar month. 5.The patient may stop CCM services at any time (effective at the end of the month) by phone call to the office staff. 6. The patient will be responsible for cost sharing (co-pay) of up to 20% of the service fee (after annual deductible is met). Patient agreed to services and consent  obtained.  Patient Care Team: Hoyt Koch, MD as PCP - General (Internal Medicine) Charlton Haws, [facility] as Pharmacist (Pharmacist)   Patient lives at home with her husband in [place]. She would rather travel to [facility] for health care than get care in [place].   Recent office visits: 02/26/20 Sharlet Salina (PCP) - Annual Exam. No med changes. F/u 6 months  Recent consult visits: 04/15/20 Patwardhan (Cardiology) - Bruising. Expected but non-threatening SE of Eliquis. No med changes.   03/17/20 nephrology  03/05/20 Gherghe (Endocrine) -Type 2 Diabetes. Decrease glipizide & methimazole (TSH 4.69). F/u 3 mos.   02/04/20 Patwardhan (Cardiology) - F/u Paroxysmal atrial fib. D/c Apoaequorin & Lidocaine. F/u 1 yr.   01/05/20 Patwardhan (Cardiology) - Paroxysmal atrial fib. No med changes.   Hospital visits: None in previous 6 months   Objective:  Lab Results  Component Value Date   CREATININE 1.36 (H) 02/26/2020   BUN 18 02/26/2020   GFR 38.88 (L) 02/26/2020   GFRNONAA 42 (L) 02/26/2019   GFRAA 48 (L) 02/26/2019   NA 142 02/26/2020   K 3.8 02/26/2020   CALCIUM 9.3 02/26/2020   CO2 30 02/26/2020   GLUCOSE 48 (LL) 02/26/2020    Lab Results  Component Value Date/Time   HGBA1C 6.8 (H) 02/26/2020 11:10 AM   HGBA1C 8.1 (A) 11/25/2019 11:05 AM   HGBA1C 8.6 (A) 09/19/2019 03:44 PM   HGBA1C 8.8 (H) 02/26/2019 03:31 PM   HGBA1C 13.0 04/26/2018 12:00 AM   GFR 38.88 (L) 02/26/2020 11:10 AM   GFR 58.68 (L) 03/19/2018 08:44 AM   MICROALBUR 75.4 (H) 09/19/2019 03:52  PM   MICROALBUR 270.5 (H) 06/05/2019 10:12 AM    Last diabetic Eye exam:  Lab Results  Component Value Date/Time   HMDIABEYEEXA No Retinopathy 04/27/2015 12:00 AM    Last diabetic Foot exam: No results found for: HMDIABFOOTEX   Lab Results  Component Value Date   CHOL 106 02/26/2020   HDL 38.20 (L) 02/26/2020   LDLCALC 49 02/26/2020   LDLDIRECT 98.0 03/19/2018   TRIG 96.0 02/26/2020   CHOLHDL 3 02/26/2020     Hepatic Function Latest Ref Rng & Units 02/26/2020 02/26/2019 04/25/2018  Total Protein 6.0 - 8.3 g/dL 7.7 6.9 -  Albumin 3.5 - 5.2 g/dL 4.1 3.8 -  AST 0 - 37 U/L 13 13 12(A)  ALT 0 - 35 U/L 10 8 13   Alk Phosphatase 39 - 117 U/L 68 118(H) 124  Total Bilirubin 0.2 - 1.2 mg/dL 0.2 <0.2 -  Bilirubin, Direct 0.0 - 0.2 mg/dL - - -    Lab Results  Component Value Date/Time   TSH 4.90 (H) 04/26/2020 10:17 AM   TSH 5.06 (H) 02/26/2020 11:10 AM   FREET4 0.71 04/26/2020 10:17 AM   FREET4 0.67 02/26/2020 11:10 AM    CBC Latest Ref Rng & Units 02/26/2020 02/26/2019 04/25/2018  WBC 4.0 - 10.5 K/uL 13.7(H) 13.4(H) 11.2  Hemoglobin 12.0 - 15.0 g/dL 11.9(L) 12.1 12.0  Hematocrit 36.0 - 46.0 % 37.3 39.1 38  Platelets 150.0 - 400.0 K/uL 364.0 379 341   CHA2DS2-VASc Score = 5  The patient's score is based upon: CHF History: Yes HTN History: Yes Diabetes History: Yes Stroke History: No Vascular Disease History: No Age Score: 1 Gender Score: 1     No results found for: VD25OH  Clinical ASCVD: No  The ASCVD Risk score Mikey Bussing DC Jr., et al., 2013) failed to calculate for the following reasons:   The valid total cholesterol range is 130 to 320 mg/dL    Depression screen [facility] 2/9 03/07/2019 07/20/2017 07/16/2015  Decreased Interest 0 0 0  Down, Depressed, Hopeless 0 1 0  PHQ - 2 Score 0 1 0      Social History   Tobacco Use  Smoking Status Former   Packs/day: 1.00   Years: 10.00   Pack years: 10.00   Types: Cigarettes   Quit date: 2005   Years since quitting: 17.4  Smokeless Tobacco Never   BP Readings from Last 3 Encounters:  04/15/20 (!) 143/71  03/05/20 128/80  02/26/20 130/82   Pulse Readings from Last 3 Encounters:  04/15/20 84  03/05/20 79  02/26/20 73   Wt Readings from Last 3 Encounters:  06/14/20 208 lb 6.4 oz (94.5 kg)  04/15/20 205 lb (93 kg)  03/05/20 205 lb 6.4 oz (93.2 kg)   BMI Readings from Last 3 Encounters:  06/14/20 35.77 kg/m  04/15/20 35.19 kg/m   03/05/20 35.26 kg/m    Assessment/Interventions: Review of patient past medical history, allergies, medications, health status, including review of consultants reports, laboratory and other test data, was performed as part of comprehensive evaluation and provision of chronic care management services.   SDOH:  (Social Determinants of Health) assessments and interventions performed: Yes SDOH Interventions    Flowsheet Row Most Recent Value  SDOH Interventions   Financial Strain Interventions Other (Comment)  [PAP for Trulicity, Eliquis]      SDOH Screenings   Alcohol Screen: Not on file  Depression (WKM6-2): Not on file  Financial Resource Strain: Medium Risk   Difficulty of Paying Living  Expenses: Somewhat hard  Food Insecurity: Not on file  Housing: Not on file  Physical Activity: Not on file  Social Connections: Not on file  Stress: Not on file  Tobacco Use: Medium Risk   Smoking Tobacco Use: Former   Smokeless Tobacco Use: Never  Transportation Needs: Not on file    [place]  Allergies  Allergen Reactions   Tramadol Nausea And Vomiting    Medications Reviewed Today     Reviewed by Nigel Mormon, MD (Physician) on 04/16/20 at [phone]  Med List Status: <None>   Medication Order Taking? Sig Documenting Provider Last Dose Status Informant  Abatacept [facility]) [id number] Yes Inject into the skin every 30 (thirty) days. [provider] Taking Active   allopurinol (ZYLOPRIM) 300 MG tablet [id number] Yes TAKE 1 TABLET BY MOUTH DAILY TO PREVENT GOUT Gregor Hams, MD Taking Active   amLODipine (NORVASC) 10 MG tablet [id number] Yes TAKE 1 TABLET BY MOUTH EVERY DAY Patwardhan, Manish J, MD Taking Active   apixaban (ELIQUIS) 5 MG TABS tablet [id number] Yes Take 1 tablet (5 mg total) by mouth 2 (two) times daily. Nigel Mormon, MD Taking Active   colchicine 0.6 MG tablet [id number] Yes TAKE 1 TABLET BY MOUTH DAILY AS NEEDED FOR PAIN  Patient taking  differently: daily.   Hoyt Koch, MD Taking Active   Dulaglutide (TRULICITY) 3 GF/8.4KJ [place] Aid [id number] Yes Inject 3 mg into the skin once a week. Philemon Kingdom, MD Taking Active   esomeprazole (NEXIUM) 40 MG capsule [id number] Yes Take 40 mg by mouth daily before breakfast. [provider] Taking Active Self  furosemide (LASIX) 20 MG tablet [id number] Yes TAKE 2 TABLETS (40 MG TOTAL) BY MOUTH 2 (TWO) TIMES DAILY. Patwardhan, Reynold Bowen, MD Taking Active   gabapentin (NEURONTIN) 300 MG capsule [id number] Yes Take 1-2 capsules (300-600 mg total) by mouth 3 (three) times daily. Hoyt Koch, MD Taking Active   glipiZIDE (GLUCOTROL) 5 MG tablet [id number]  TAKE 2 TABLETS (10 MG TOTAL) BY MOUTH 2 (TWO) TIMES DAILY BEFORE A MEAL. Philemon Kingdom, MD  Active   hydrALAZINE (APRESOLINE) 25 MG tablet [id number] Yes TAKE 1 TABLET BY MOUTH THREE TIMES A DAY Patwardhan, Manish J, MD Taking Active   LEVEMIR FLEXTOUCH 100 UNIT/ML FlexPen [id number] Yes INJECT 50 UNITS INTO THE SKIN AT BEDTIME Philemon Kingdom, MD Taking Active   methimazole (TAPAZOLE) 5 MG tablet [id number] Yes TAKE 1 TABLET BY MOUTH TWICE A DAY  Patient taking differently: Take 5 mg by mouth daily.   Philemon Kingdom, MD Taking Active   metoprolol succinate (TOPROL-XL) 50 MG 24 hr tablet [id number] Yes TAKE 1 TABLET (50 MG TOTAL) BY MOUTH DAILY. TAKE WITH OR IMMEDIATELY FOLLOWING A MEAL.  Patient taking differently: Take 100 mg by mouth daily. Take with or immediately following a meal.   Patwardhan, Manish J, MD Taking Active   rosuvastatin (CRESTOR) 10 MG tablet [id number] Yes TAKE 1 TABLET BY MOUTH EVERY DAY Patwardhan, Manish J, MD Taking Active   VOLTAREN 1 % GEL [id number] Yes Apply 2 g topically 4 (four) times daily.  Patient taking differently: Apply 2 g topically as needed.   Hoyt Koch, MD Taking Active             Patient Active Problem List   Diagnosis Date Noted   Bruising 04/16/2020    Mixed hyperlipidemia 02/26/2019   Paroxysmal atrial fibrillation ([place]) 07/18/2018   H/O Graves' disease 07/18/2018   Yeast infection 03/19/2018  Abdominal pain 12/05/2017   Hyperglycemia due to type 2 diabetes mellitus ([place]) 07/14/2017   Fall at home, initial encounter 07/14/2017   Rheumatoid arthritis ([place]) 07/14/2017   Grief reaction 07/14/2017   Drug-induced hyperkalemia 07/14/2017   Myalgia 07/06/2017   Acute gout 05/15/2017   Hyperkalemia 05/11/2017   New onset of congestive heart failure ([place]) 04/21/2017   Chronic respiratory failure ([place]) 04/21/2017   Leukocytosis 04/21/2017   History of atrial fibrillation    Cough 04/20/2017   Hyperthyroidism 04/18/2017   Atrial fibrillation with RVR ([place]) 04/15/2017   Routine general medical examination at a health care facility 10/20/2015   Diabetes mellitus type 2 with complications ([place]) 19/37/9024   Acute pain of left knee 01/28/2015   Hyperlipidemia associated with type 2 diabetes mellitus ([place]) 09/02/2014   Morbid obesity ([place]) 09/02/2014   Essential (primary) hypertension 10/04/2012    Immunization History  Administered Date(s) Administered   Fluad Quad(high Dose 65+) 09/22/2019   Influenza, High Dose Seasonal PF 10/20/2015, 11/02/2016, 10/26/2018   Influenza,inj,Quad PF,6+ Mos 10/27/2014   Influenza-Unspecified 10/16/2017   PFIZER(Purple Top)SARS-COV-2 Vaccination 02/07/2019, 02/28/2019, 10/15/2019, 05/21/2020   Pneumococcal Conjugate-13 12/30/2014   Pneumococcal Polysaccharide-23 10/29/2015   Zoster Recombinat (Shingrix) 10/26/2018, 05/21/2020    Conditions to be addressed/monitored:  Hypertension, Hyperlipidemia, Diabetes, Atrial Fibrillation, Heart Failure, and Gout  Care Plan : [place]  Updates made by Charlton Haws, [place] since 06/16/2020 12:00 AM     Problem: Hypertension, Hyperlipidemia, Diabetes, Atrial Fibrillation, Heart Failure, and Gout   Priority: High     Long-Range Goal: Disease  management   Start Date: 06/16/2020  Expected End Date: 06/16/2021  This Visit's Progress: On track  Priority: High  Note:   Current Barriers:  Unable to independently monitor therapeutic efficacy  Pharmacist Clinical Goal(s):  Patient will achieve adherence to monitoring guidelines and medication adherence to achieve therapeutic efficacy through collaboration with PharmD and provider.   Interventions: 1:1 collaboration with Hoyt Koch, MD regarding development and update of comprehensive plan of care as evidenced by provider attestation and co-signature Inter-disciplinary care team collaboration (see longitudinal plan of care) Comprehensive medication review performed; medication list updated in electronic medical record  Hyperlipidemia: (LDL goal < 70) -Controlled - LDL is at goal; pt endorses compliance and denies side effects -Current treatment: Rosuvastatin 10 mg daily -Current exercise habits: walks 3 days a week with cousins -Educated on Cholesterol goals; Benefits of statin for ASCVD risk reduction; -Recommended to continue current medication  Diabetes (A1c goal <7%) -Controlled - checks BG twice a day; per chart she was on metformin in the past but it was dc'd after a hospitalization for AKI in 2019, never restarted. -DX 1987, insulin-dependent since 2006 -Current medications: Trulicity 3 mg weekly Glipizide 5 mg - 2 tab BID Levemir 50 units HS - takes 40-50 units -Medications previously tried: metformin, Victoza, Bydureon -Current home glucose readings fasting glucose: 73-103 post prandial glucose: 130-194 -Reports mild hypoglycemia, usually in AM after she skips dinner -Educated on A1c and blood sugar goals; Exercise goal of 150 minutes per week; Prevention and management of hypoglycemic episodes; Benefits of routine self-monitoring of blood sugar; -Recommended to continue current medication; consider restarting metformin  Atrial Fibrillation (Goal: prevent  stroke and major bleeding) -Controlled - pt endorses compliance and denies issues; she endorses some bruising but has been reassured by cardiologist that this is not harmful -CHADSVASC: 5 -Current treatment: Rate control: Metoprolol 50 mg daily Anticoagulation: Eliquis 5 mg BID -Counseled on  increased risk of stroke due to Afib and benefits of anticoagulation for stroke prevention; importance of adherence to anticoagulant exactly as prescribed; bleeding risk associated with Eliquis and importance of self-monitoring for signs/symptoms of bleeding; avoidance of NSAIDs due to increased bleeding risk with anticoagulants; -Recommended to continue current medication  Heart Failure / Hypertension (Goal: BP goal < 140/90,prevent exacerbations) -Not ideally controlled - pt is checking BP daily (reports her monitor sends readings to cardiologist) and it is above goal; she reports swelling is under control -Last ejection fraction: 60-65% (Date: 04/16/2017) -HF type: Diastolic -Current treatment: Amlodipine 10 mg daily Furosemide 20 mg BID Hydralazine 25 mg TID Metoprolol succinate 50 mg daily -Current home BP/HR readings: 143/81, 141/73 -Educated on Benefits of medications for managing symptoms and prolonging life; Importance of blood pressure control -Counseled on diet and exercise extensively Recommended to continue current medication  Graves disease / hyperthyroidism (Goal: maintain TSH/T4 in goal range) -Not ideally controlled - TSH minimally elevated at last check, T3 and T4 within normal range; pt follows with Dr Cruzita Lederer -She reports bump/knot in her neck that started last week, at first it was painful but not anymore; she denies injury to the area, she cannot think of anything that may have caused it -Current treatment  Methimazole 5 mg daily -Advised f/u with PCP, Dr Cruzita Lederer regarding knot in neck -Recommend to continue current medication  GERD (Goal: manage symptoms) -Controlled -  Patient is satisfied with current regimen and denies issues -Current treatment  Esomeprazole 40 mg daily -Recommended to continue current medication  Pain (Goal: manage symptoms) -Hx RA, knee pain -Controlled - pt reports pain is under control; it does not limit her activities -Current treatment  Gabapentin 300 mg - 2 caps BID and 1 cap HS (#5 daily) Voltaren gel PRN -Recommended to use Tylenol up to 3000 mg/day for pain; avoid NSAIDS  Gout (Goal: prevent flares) -Controlled - pt reports she has not had a flare since April 2021 when allopurinol was initially prescribed; she is not familiar with the drug and reports she is not taking it anymore; per chart it was filled up until Aug 2021; she is not taking colchicine either; she reports she made diet changes and gout has not been an issue for her since -Current treatment  Allopurinol 300 mg daily Colchicine 0.6 mg daily -Counseled on indication for allopurinol for gout prevention and colchicine for gout treatment -Discussed treatment options - restarting allopurinol vs continuing low purine diet and monitoring; pt opted for diet control  Health Maintenance -Vaccine gaps: TDAP, Shingrix (dose 2), covid booster -Current therapy:  Vitamin D 50,000 IU weekly -Patient is satisfied with current therapy and denies issues -Recommended to continue current medication  Patient Goals/Self-Care Activities Patient will:  - take medications as prescribed focus on medication adherence by pill box check glucose daily, document, and provide at future appointments check blood pressure daily, document, and provide at future appointment       Medication Assistance:  [place] (through 01/01/21) - via Endocrine Eliquis - BMS PAP (through 01/01/21) - via Cardiology  Compliance/Adherence/Medication fill history: Care Gaps: TDAP - advised to get at local pharmacy Shingrix (2nd dose due 12/21/18) - completed May 2022 Covid booster  (due 01/15/20) - completed May 2022 Mammogram (due 03/12/20) Colonoscopy (never done) Eye exam (due 04/26/16) - pt reports she gets one every year w/ Dr Catalina Antigua @ [facility] [phone])  Star-Rating Drugs: Rosuvastatin - last fill 03/13/20 90D  Patient's preferred pharmacy  is:  [facility] [address] [facility] [address] [place] [address] Phone: [phone] Fax: [phone]  [id number]: [phone]: [phone] Uses pill box? Yes Pt endorses 100% compliance  We discussed: Current pharmacy is preferred with insurance plan and patient is satisfied with pharmacy services Patient decided to: Continue current medication management strategy  Care Plan and Follow Up Patient Decision:  Patient agrees to Care Plan and Follow-up.  Plan: Telephone follow up appointment with care management team member scheduled for:  3 months  LCharlene Brooke PharmD, B[place] CPP Clinical Pharmacist [phone]

## 2020-06-16 NOTE — Patient Instructions (Signed)
Visit Information  Phone number for Pharmacist: [phone]  Thank you for meeting with me to discuss your medications! I look forward to working with you to achieve your health care goals. Below is a summary of what we talked about during the visit:   Goals Addressed             This Visit's Progress    Manage My Medicine       Timeframe:  Long-Range Goal Priority:  Medium Start Date:      06/16/20                       Expected End Date:    06/16/21                   Follow Up Date: Sept 2022   - call for medicine refill 2 or 3 days before it runs out - call if I am sick and can't take my medicine - keep a list of all the medicines I take; vitamins and herbals too - use a pillbox to sort medicine    Why is this important?   These steps will help you keep on track with your medicines.   Notes:          Donna Booker was given information about Chronic Care Management services today including:  CCM service includes personalized support from designated clinical staff supervised by her physician, including individualized plan of care and coordination with other care providers 24/7 contact phone numbers for assistance for urgent and routine care needs. Standard insurance, coinsurance, copays and deductibles apply for chronic care management only during months in which we provide at least 20 minutes of these services. Most insurances cover these services at 100%, however patients may be responsible for any copay, coinsurance and/or deductible if applicable. This service may help you avoid the need for more expensive face-to-face services. Only one practitioner may furnish and bill the service in a calendar month. The patient may stop CCM services at any time (effective at the end of the month) by phone call to the office staff.  Patient agreed to services and verbal consent obtained.   The patient verbalized understanding of instructions, educational materials, and care plan  provided today and declined offer to receive copy of patient instructions, educational materials, and care plan.  The pharmacy team will reach out to the patient again over the next 90 days.   Al Corpus, PharmD, Patsy [place], CPP Clinical Pharmacist [facility] [phone]

## 2020-06-17 ENCOUNTER — Ambulatory Visit: Payer: Medicare Other | Admitting: Internal Medicine

## 2020-06-17 NOTE — Progress Notes (Signed)
Called Dr Marvis Moeller office @ [facility] [phone] to requests last eye exam.  Benedict Needy, RMA Clinical Pharmacists Assistant [phone]  Time Spent: 10

## 2020-06-20 ENCOUNTER — Other Ambulatory Visit: Payer: Self-pay | Admitting: Internal Medicine

## 2020-06-20 ENCOUNTER — Other Ambulatory Visit: Payer: Self-pay | Admitting: Cardiology

## 2020-06-20 DIAGNOSIS — E0500 Thyrotoxicosis with diffuse goiter without thyrotoxic crisis or storm: Secondary | ICD-10-CM

## 2020-06-21 ENCOUNTER — Other Ambulatory Visit: Payer: Self-pay | Admitting: Family Medicine

## 2020-06-21 NOTE — Telephone Encounter (Signed)
Rx refill request approved per Dr. Corey's orders. 

## 2020-06-28 ENCOUNTER — Encounter: Payer: Self-pay | Admitting: Internal Medicine

## 2020-06-28 ENCOUNTER — Other Ambulatory Visit: Payer: Self-pay

## 2020-06-28 ENCOUNTER — Other Ambulatory Visit: Payer: Self-pay | Admitting: Internal Medicine

## 2020-06-28 ENCOUNTER — Ambulatory Visit (INDEPENDENT_AMBULATORY_CARE_PROVIDER_SITE_OTHER): Payer: Medicare Other | Admitting: Internal Medicine

## 2020-06-28 VITALS — BP 124/76 | HR 75 | Ht 64.0 in | Wt 212.0 lb

## 2020-06-28 DIAGNOSIS — E785 Hyperlipidemia, unspecified: Secondary | ICD-10-CM

## 2020-06-28 DIAGNOSIS — E0500 Thyrotoxicosis with diffuse goiter without thyrotoxic crisis or storm: Secondary | ICD-10-CM

## 2020-06-28 DIAGNOSIS — E1169 Type 2 diabetes mellitus with other specified complication: Secondary | ICD-10-CM

## 2020-06-28 DIAGNOSIS — E118 Type 2 diabetes mellitus with unspecified complications: Secondary | ICD-10-CM | POA: Diagnosis not present

## 2020-06-28 LAB — TSH: TSH: 3.94 u[IU]/mL (ref 0.35–4.50)

## 2020-06-28 LAB — POCT GLYCOSYLATED HEMOGLOBIN (HGB A1C): Hemoglobin A1C: 6.1 % — AB (ref 4.0–5.6)

## 2020-06-28 LAB — T3, FREE: T3, Free: 3.4 pg/mL (ref 2.3–4.2)

## 2020-06-28 LAB — T4, FREE: Free T4: 0.79 ng/dL (ref 0.60–1.60)

## 2020-06-28 MED ORDER — GLIPIZIDE 5 MG PO TABS: ORAL_TABLET | ORAL | 3 refills | Status: DC

## 2020-06-28 NOTE — Progress Notes (Signed)
Patient ID: Donna Booker, female   DOB: 1945-09-08, 75 y.o.   MRN: [id number]  This visit occurred during the SARS-CoV-2 public health emergency.  Safety protocols were in place, including screening questions prior to the visit, additional usage of staff PPE, and extensive cleaning of exam room while observing appropriate contact time as indicated for disinfecting solutions.   HPI: Donna Booker is a 75 y.o.-year-old female, presenting for f/u for DM2, dx in 1987, insulin-dependent since 2006, uncontrolled, with complications (PN, stage 2-3 CKD)and Graves ds. Last visit 3.5 months ago.  Interim history: No increased urination, blurry vision, nausea, chest pain. Had 7 lbs weight gain since last OV.  Graves ds  -Diagnosed during the admission for A. fib with RVR in 05/2017.  Reviewed her TFTs: Lab Results  Component Value Date   TSH 4.90 (H) 04/26/2020   TSH 5.06 (H) 02/26/2020   TSH 3.03 09/19/2019   TSH 0.46 06/05/2019   TSH <0.01 (L) 04/22/2019   TSH <0.01 (L) 03/03/2019   TSH <0.005 (L) 02/26/2019   TSH 10.31 (H) 12/04/2017   TSH <0.010 (L) 07/14/2017   TSH <0.01 (L) 07/06/2017   Lab Results  Component Value Date   FREET4 0.71 04/26/2020   FREET4 0.67 02/26/2020   FREET4 0.61 09/19/2019   FREET4 0.63 06/05/2019   FREET4 0.76 04/22/2019   FREET4 1.98 (H) 03/03/2019   FREET4 0.57 (L) 12/04/2017   FREET4 0.71 (L) 07/15/2017   FREET4 0.72 07/06/2017   FREET4 1.15 05/10/2017   Lab Results  Component Value Date   T3FREE 3.0 04/26/2020   T3FREE 3.0 02/26/2020   T3FREE 3.1 09/19/2019   T3FREE 3.2 06/05/2019   T3FREE 3.3 04/22/2019   T3FREE 4.4 (H) 03/03/2019   T3FREE 2.8 12/04/2017   T3FREE 2.1 07/15/2017   T3FREE 4.2 05/10/2017   T3FREE 18.5 (H) 04/16/2017   Graves' antibodies were elevated Lab Results  Component Value Date   TSI 8.02 (H) 04/16/2017   She was started on methimazole 10 mg 2x a day and atenolol 50 mg 3x a day.Her TFTs were significantly improved  >> we decreased the methimazole to 5 mg 2X a day, but then on 5 mg daily (? When changed - by PCP? -Patient cannot remember exactly when she started her prescription for methimazole only mentioned to take it once a day...).   12/2017: We stopped methimazole as the TSH was 10.   02/2019: Patient finally return for labs: TSH was suppressed completely 03/2019: We restarted methimazole 5 mg twice a day  03/2020: We decreased the methimazole dose to 5 mg daily 04/2020: I advised her to decrease the methimazole dose to 2.5 mg daily >> SHE FORGOT, so she continues on 5 mg daily  She continues Toprol-XL.  Pt denies: - feeling nodules in neck - hoarseness - dysphagia - choking - SOB with lying down  No FH of thyroid disease or thyroid cancer. No h/o radiation tx to head or neck.  No herbal supplements. No Biotin use. No recent steroids use.   DM2: Reviewed HbA1c levels Lab Results  Component Value Date   HGBA1C 6.8 (H) 02/26/2020   HGBA1C 8.1 (A) 11/25/2019   HGBA1C 8.6 (A) 09/19/2019   HGBA1C 7.3 (A) 06/05/2019   HGBA1C 8.8 (H) 02/26/2019   HGBA1C 13.0 04/26/2018   HGBA1C 14.1 (H) 03/19/2018   HGBA1C 13.1 (A) 12/28/2017   HGBA1C 9.8 (H) 07/14/2017   HGBA1C 8.0 02/01/2016   HGBA1C 8.3 (H) 10/20/2015   HGBA1C 8.4  07/29/2015   HGBA1C 10.0 04/27/2015   HGBA1C 9.5 (H) 12/29/2014   HGBA1C 9.1 (H) 09/01/2014   She is on: - Glipizide 10 g 2x a day before meals (FORGOT to decrease) - Levemir 40 >> 50 >> 40-50 units at night  - Trulicity 1.5 mg weekly - started 12/2018 >> 3 mg weekly  She was previously on a VGo patch pump.   She also tried Bydureon (02/2013-10/2014) >> made her hungry, did not lower sugars  She checks sugars once a day: - am: 86, 91-136, but 171-188 off insulin >> 73-120, 131, 168 >>  73-114 - 2h after b'fast: n/c >> 167 >> 99-164 - before lunch: 221 (cantaloupe) >> n/c >> 136, 158 - 2h after lunch: n/c >> 200 >> n/c >> 165-185 >> n/c >> 136, 140 - before dinner:  120-125 >> ? >> 134-144 >> 108, 191 >> n/c - 2h after dinner: n/c >> 189 >> n/c >> 175-201 >> n/c >> 194 - bedtime: 192, 265 >> 130-137 >> ? >> 207 >> n/c - nighttime: n/c Lowest sugar was 125 >> 86 >> LO, 48 (gastroenteritis >> could not eat) 02/2020 >> 73; she has hypoglycemia awareness in the 70s. Highest sugar was 298 ... >> 207 (Chick fil-a) >> 194  Glucometer: AccuChek  -+ CKD, last BUN/creatinine:  Lab Results  Component Value Date   BUN 18 02/26/2020   CREATININE 1.36 (H) 02/26/2020  04/25/2018: Glucose 269, BUN/creatinine 18/1.05 Lab Results  Component Value Date   MICRALBCREAT 110.0 (H) 09/19/2019   MICRALBCREAT 192.6 (H) 06/05/2019  Previously on olmesartan, now off (?)  -+ HL; last set of lipids: Lab Results  Component Value Date   CHOL 106 02/26/2020   HDL 38.20 (L) 02/26/2020   LDLCALC 49 02/26/2020   LDLDIRECT 98.0 03/19/2018   TRIG 96.0 02/26/2020   CHOLHDL 3 02/26/2020  On Crestor 10.  - last eye exam was in 03/21/2020: No DR reportedly.  - no numbness and tingling in her feet.   She previously had hyperkalemia, which we addressed in the past.  This resolved. - Possibly 2/2 ACE inhibitor, potassium supplementation +/-beta-blocker - After an initial K of 7.5 in 07/2017 >> K levels normalized after stopping ACEI and K supplements Lab Results  Component Value Date   K 3.8 02/26/2020   K 4.9 02/26/2019   K 4.1 04/25/2018   K 3.9 03/19/2018   K 4.5 11/28/2017  Of note, a cosyntropin stimulation test was normal and also aldosterone and renin were normal while in the hospital.   She had AKI on admission then.  Her son died in 2017-07-23-he had severe heart failure.  ROS: Constitutional: no weight gain/no weight loss, no fatigue, no subjective hyperthermia, no subjective hypothermia Eyes: no blurry vision, no xerophthalmia ENT: no sore throat, + see HPI Cardiovascular: no CP/no SOB/no palpitations/no leg swelling Respiratory: no cough/no SOB/no  wheezing Gastrointestinal: no N/no V/no D/no C/no acid reflux Musculoskeletal: no muscle aches/+ joint aches Skin: no rashes, no hair loss Neurological: no tremors/no numbness/no tingling/no dizziness  I reviewed pt's medications, allergies, PMH, social hx, family hx, and changes were documented in the history of present illness. Otherwise, unchanged from my initial visit note.  Past Medical History:  Diagnosis Date   Afib (HCC)    Diabetes mellitus    Graves disease    Heart failure (HCC)    Hypertension    Hyperthyroidism    Past Surgical History:  Procedure Laterality Date   BREAST  SURGERY     Social History   Social History   Marital Status: Married    Spouse Name: N/A   Number of Children: 1   Occupational History   Customer svc rep   Social History Main Topics   Smoking status: Never Smoker    Smokeless tobacco: Not on file   Alcohol Use: No   Drug Use: No   Current Outpatient Medications on File Prior to Visit  Medication Sig Dispense Refill   Abatacept (ORENCIA [place]) Inject into the skin every 30 (thirty) days.     allopurinol (ZYLOPRIM) 300 MG tablet TAKE 1 TABLET BY MOUTH EVERY DAY 90 tablet 1   amLODipine (NORVASC) 10 MG tablet TAKE 1 TABLET BY MOUTH EVERY DAY 90 tablet 1   apixaban (ELIQUIS) 5 MG TABS tablet Take 1 tablet (5 mg total) by mouth 2 (two) times daily. 180 tablet 3   colchicine 0.6 MG tablet TAKE 1 TABLET BY MOUTH DAILY AS NEEDED FOR PAIN (Patient not taking: Reported on 06/16/2020) 90 tablet 1   Dulaglutide (TRULICITY) 3 MG/0.5ML SOPN Inject 3 mg into the skin once a week. 2 mL 3   esomeprazole (NEXIUM) 40 MG capsule Take 40 mg by mouth daily before breakfast.     furosemide (LASIX) 20 MG tablet TAKE 2 TABLETS (40 MG TOTAL) BY MOUTH 2 (TWO) TIMES DAILY. 360 tablet 1   gabapentin (NEURONTIN) 300 MG capsule TAKE 1-2 CAPSULES (300-600 MG TOTAL) BY MOUTH 3 (THREE) TIMES DAILY. (Patient taking differently: Take 300-600 mg by mouth. 2 capsules BID and 1  capsule at bedtime (total 5 per day)) 180 capsule 1   glipiZIDE (GLUCOTROL) 5 MG tablet TAKE 2 TABLETS (10 MG TOTAL) BY MOUTH 2 (TWO) TIMES DAILY BEFORE A MEAL. 360 tablet 2   hydrALAZINE (APRESOLINE) 25 MG tablet TAKE 1 TABLET BY MOUTH THREE TIMES A DAY 270 tablet 0   LEVEMIR FLEXTOUCH 100 UNIT/ML FlexPen INJECT 50 UNITS INTO THE SKIN AT BEDTIME (Patient taking differently: Inject 40-50 Units into the skin at bedtime.) 15 mL 2   methimazole (TAPAZOLE) 5 MG tablet Take 0.5 tablets (2.5 mg total) by mouth daily. 45 tablet 0   metoprolol succinate (TOPROL-XL) 50 MG 24 hr tablet TAKE 1 TABLET (50 MG TOTAL) BY MOUTH DAILY. TAKE WITH OR IMMEDIATELY FOLLOWING A MEAL. (Patient taking differently: Take 50 mg by mouth 2 (two) times daily. Take with or immediately following a meal.) 90 tablet 2   rosuvastatin (CRESTOR) 10 MG tablet TAKE 1 TABLET BY MOUTH EVERY DAY 90 tablet 1   Vitamin D, Ergocalciferol, (DRISDOL) 1.25 MG (50000 UNIT) CAPS capsule Take 50,000 Units by mouth every 7 (seven) days.     VOLTAREN 1 % GEL Apply 2 g topically 4 (four) times daily. (Patient taking differently: Apply 2 g topically as needed.) 100 g 3   No current facility-administered medications on file prior to visit.   Allergies  Allergen Reactions   Tramadol Nausea And Vomiting    FH - see HPI  PE: BP 124/76   Pulse 75   Ht  (1.626 m)   Wt 212 lb (96.2 kg)   SpO2 97%   BMI 36.39 kg/m  Wt Readings from Last 3 Encounters:  06/28/20 212 lb (96.2 kg)  06/14/20 208 lb 6.4 oz (94.5 kg)  04/15/20 205 lb (93 kg)   Constitutional: overweight, in NAD Eyes: PERRLA, EOMI, no exophthalmos ENT: moist mucous membranes, no thyromegaly, no cervical lymphadenopathy Cardiovascular: RRR, No MRG Respiratory: CTA  B Gastrointestinal: abdomen soft, NT, ND, BS+ Musculoskeletal: no deformities, strength intact in all 4 Skin: moist, warm, no rashes Neurological: no tremor with outstretched hands, DTR normal in all  4  ASSESSMENT: 1. 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  Graves' disease  3.  HL  We previously also addressed her hyperkalemia: - in 07/2017, she had a critical value of potassium of 7.5.  She was admitted with high potassium since then >> Lasix, potassium supplementation, and lisinopril were stopped. - A cosyntropin stimulation test performed in the hospital was normal, ruling out primary adrenal insufficiency as a cause for hyperkalemia - Aldosterone and renin levels were normal during her last hospitalization - On her medication list, I do not see any possible culprits,  other than possibly propranolol - I suggested a referral to nephrology  if her potassium continues to remain elevated after stopping ACE inhibitor and potassium supplementation - Potassium normalized afterwards  PLAN:  1. Patient with longstanding, previously uncontrolled, type 2 diabetes, on oral antidiabetic regimen with sulfonylurea and also daily long-acting insulin and weekly GLP-1 receptor agonist, with improved control after starting Trulicity.  At last visit, sugars were mostly at goal in the morning even on the low side, and she was not checking later in the day.  We discussed about the importance of doing so but I also advised her to decrease the glipizide dose to avoid hypoglycemia.  At this visit, however, she tells me that she forgot about this.  She did have an episode of hypoglycemia in which her sugars dropped to 48 during gastroenteritis, when she was not eating well.  Latest HbA1c was 6.8% at last visit. -At today's visit, sugars are at goal, but many in the 70s in the morning and they are slightly higher later in the day, but she is not checking consistently in the evening.  I again advised her to do so.  For now, I advised her to decrease the glipizide before dinner but keep the same dose of glipizide in the morning. - I suggested to:  Patient Instructions   Please continue: - Levemir 50 mg at night - Trulicity 3 mg weekly  Change: - Glipizide 10 mg before b'fast and 5 mg before dinner  Please continue: - Methimazole 5 mg daily  Please stop at the lab.  Please return in 3-4 months with your sugar log.   - we checked her HbA1c: 6.8% (stable) - advised to check sugars at different times of the day - 1-2x a day, rotating check times - advised for yearly eye exams >> she is UTD - return to clinic in 3-4 months  2.  Graves' disease -Her hyperthyroidism was diagnosed after she was admitted with A. fib with RVR and acute pulmonary edema in 04/2017.  -We diagnosed Graves' disease based on elevated TSI antibodies and also the clinical course -We started methimazole initially, however, we then had to stop this completely after her TSH increased to 10 and then restarted in 03/2019 at 5 mg twice a day as a TSH returned suppressed.  TFTs normalized on this dose. -At last visit, we decreased the dose to 5 mg daily as the TSH was slightly high again.  In 04/2020 we called to advise her to decrease the dose further to 2.5 mg daily.  She forgot about the phone call so she continues on 5 mg daily -At today's visit, she denies hypothyroid symptoms including weight loss, palpitations, anxiety, heat intolerance.  She did have weight gain of 7 pounds since last visit.  -We will recheck her TFTs and adjust methimazole accordingly  3. HL -Reviewed latest lipid panel from 02/2020: Fractions at goal with the exception of an HDL that was slightly low: Lab Results  Component Value Date   CHOL 106 02/26/2020   HDL 38.20 (L) 02/26/2020   LDLCALC 49 02/26/2020   LDLDIRECT 98.0 03/19/2018   TRIG 96.0 02/26/2020   CHOLHDL 3 02/26/2020  -Continues Crestor 10 mg daily without side effects  Component     Latest Ref Rng & Units 06/28/2020  TSH     0.35 - 4.50 uIU/mL 3.94  T4,Free(Direct)     0.60 - 1.60 ng/dL 7.16  Triiodothyronine,Free,Serum     2.3 - 4.2  pg/mL 3.4  TFTs are normal, but TSH is close to the upper limit of normal so we will need to decrease the methimazole to 2.5 mg daily and recheck the tests in 1 month.  Carlus Pavlov, MD PhD [facility] Endocrinology

## 2020-06-28 NOTE — Patient Instructions (Addendum)
Please continue: - Levemir 50 mg at night - Trulicity 3 mg weekly  Change: - Glipizide 10 mg before b'fast and 5 mg before dinner  Please continue: - Methimazole 5 mg daily  Please stop at the lab.  Please return in 3-4 months with your sugar log.

## 2020-07-14 ENCOUNTER — Other Ambulatory Visit: Payer: Self-pay

## 2020-07-14 ENCOUNTER — Other Ambulatory Visit (INDEPENDENT_AMBULATORY_CARE_PROVIDER_SITE_OTHER): Payer: Medicare Other

## 2020-07-14 DIAGNOSIS — E0500 Thyrotoxicosis with diffuse goiter without thyrotoxic crisis or storm: Secondary | ICD-10-CM

## 2020-07-14 LAB — TSH: TSH: 1.37 u[IU]/mL (ref 0.35–5.50)

## 2020-07-14 LAB — T3, FREE: T3, Free: 3.5 pg/mL (ref 2.3–4.2)

## 2020-07-14 LAB — T4, FREE: Free T4: 1.3 ng/dL (ref 0.60–1.60)

## 2020-07-17 ENCOUNTER — Other Ambulatory Visit: Payer: Self-pay | Admitting: Internal Medicine

## 2020-07-22 ENCOUNTER — Other Ambulatory Visit: Payer: Medicare Other

## 2020-08-08 ENCOUNTER — Other Ambulatory Visit: Payer: Self-pay | Admitting: Internal Medicine

## 2020-09-15 ENCOUNTER — Other Ambulatory Visit: Payer: Self-pay | Admitting: Internal Medicine

## 2020-09-17 ENCOUNTER — Ambulatory Visit: Payer: Medicare Other | Admitting: Internal Medicine

## 2020-09-17 ENCOUNTER — Telehealth: Payer: Medicare Other

## 2020-09-27 ENCOUNTER — Other Ambulatory Visit: Payer: Self-pay

## 2020-09-27 ENCOUNTER — Ambulatory Visit (INDEPENDENT_AMBULATORY_CARE_PROVIDER_SITE_OTHER): Payer: Medicare Other | Admitting: Pharmacist

## 2020-09-27 ENCOUNTER — Telehealth: Payer: Self-pay | Admitting: Pharmacist

## 2020-09-27 DIAGNOSIS — E118 Type 2 diabetes mellitus with unspecified complications: Secondary | ICD-10-CM

## 2020-09-27 DIAGNOSIS — I5032 Chronic diastolic (congestive) heart failure: Secondary | ICD-10-CM

## 2020-09-27 DIAGNOSIS — I480 Paroxysmal atrial fibrillation: Secondary | ICD-10-CM

## 2020-09-27 DIAGNOSIS — E0590 Thyrotoxicosis, unspecified without thyrotoxic crisis or storm: Secondary | ICD-10-CM

## 2020-09-27 DIAGNOSIS — I10 Essential (primary) hypertension: Secondary | ICD-10-CM

## 2020-09-27 NOTE — Progress Notes (Cosign Needed)
Chronic Care Management Pharmacy Note  09/29/2020 Name:  Donna Booker MRN:  [id number] DOB:  [birth year]  Summary: -Pt is not taking glipizide PM dose (prescribed 10 mg AM, 5 mg PM); BG has been elevated as a result (fasting BG >150, post prandial up to 200) -Pt reports home BP consistently in 140s/80s   Recommendations/Changes made from today's visit: -Advised pt to restart glipizide 5 mg w/ dinner, as prescribed -Advised pt to contact cardiology for BP medication adjustment   Subjective: Donna Booker is an 75 y.o. year old female who is a primary patient of Hoyt Koch, MD.  The CCM team was consulted for assistance with disease management and care coordination needs.    Engaged with patient by telephone for follow up visit in response to provider referral for pharmacy case management and/or care coordination services.   Consent to Services:  The patient was given information about Chronic Care Management services, agreed to services, and gave verbal consent prior to initiation of services.  Please see initial visit note for detailed documentation.   Patient Care Team: Hoyt Koch, MD as PCP - General (Internal Medicine) Charlton Haws, [facility] as Pharmacist (Pharmacist)   Patient lives at home with her husband in [place]. She would rather travel to [facility] for health care than get care in [place].   Recent office visits: 02/26/20 Sharlet Salina (PCP) - Annual Exam. No med changes. F/u 6 months  Recent consult visits: 06/28/20 Dr Cruzita Lederer (endocrine): f/u DM, Graves ds. Reduce glipizide to 5 mg before dinner and check sugars at rotating time of day. Reduced methimazole to 2.5 mg and return for labs ~7/21. Repeat TFT wnl, continue methimazole 2.5 mg.  04/15/20 Patwardhan (Cardiology) - Bruising. Expected but non-threatening SE of Eliquis. No med changes.   03/17/20 nephrology  03/05/20 Gherghe (Endocrine) -Type 2 Diabetes. Decrease glipizide & methimazole  (TSH 4.69). F/u 3 mos.   02/04/20 Patwardhan (Cardiology) - F/u Paroxysmal atrial fib. D/c Apoaequorin & Lidocaine. F/u 1 yr.   01/05/20 Patwardhan (Cardiology) - Paroxysmal atrial fib. No med changes.   Hospital visits: None in previous 6 months   Objective:  Lab Results  Component Value Date   CREATININE 1.36 (H) 02/26/2020   BUN 18 02/26/2020   GFR 38.88 (L) 02/26/2020   GFRNONAA 42 (L) 02/26/2019   GFRAA 48 (L) 02/26/2019   NA 142 02/26/2020   K 3.8 02/26/2020   CALCIUM 9.3 02/26/2020   CO2 30 02/26/2020   GLUCOSE 48 (LL) 02/26/2020    Lab Results  Component Value Date/Time   HGBA1C 6.1 (A) 06/28/2020 12:58 PM   HGBA1C 6.8 (H) 02/26/2020 11:10 AM   HGBA1C 8.1 (A) 11/25/2019 11:05 AM   HGBA1C 8.8 (H) 02/26/2019 03:31 PM   HGBA1C 13.0 04/26/2018 12:00 AM   GFR 38.88 (L) 02/26/2020 11:10 AM   GFR 58.68 (L) 03/19/2018 08:44 AM   MICROALBUR 75.4 (H) 09/19/2019 03:52 PM   MICROALBUR 270.5 (H) 06/05/2019 10:12 AM    Last diabetic Eye exam:  Lab Results  Component Value Date/Time   HMDIABEYEEXA No Retinopathy 04/27/2015 12:00 AM    Last diabetic Foot exam: No results found for: HMDIABFOOTEX   Lab Results  Component Value Date   CHOL 106 02/26/2020   HDL 38.20 (L) 02/26/2020   LDLCALC 49 02/26/2020   LDLDIRECT 98.0 03/19/2018   TRIG 96.0 02/26/2020   CHOLHDL 3 02/26/2020    Hepatic Function Latest Ref Rng & Units 02/26/2020 02/26/2019 04/25/2018  Total  Protein 6.0 - 8.3 g/dL 7.7 6.9 -  Albumin 3.5 - 5.2 g/dL 4.1 3.8 -  AST 0 - 37 U/L 13 13 12(A)  ALT 0 - 35 U/L 10 8 13   Alk Phosphatase 39 - 117 U/L 68 118(H) 124  Total Bilirubin 0.2 - 1.2 mg/dL 0.2 <0.2 -  Bilirubin, Direct 0.0 - 0.2 mg/dL - - -    Lab Results  Component Value Date/Time   TSH 1.37 07/14/2020 02:11 PM   TSH 3.94 06/28/2020 10:18 AM   FREET4 1.30 07/14/2020 02:11 PM   FREET4 0.79 06/28/2020 10:18 AM    CBC Latest Ref Rng & Units 02/26/2020 02/26/2019 04/25/2018  WBC 4.0 - 10.5 K/uL 13.7(H)  13.4(H) 11.2  Hemoglobin 12.0 - 15.0 g/dL 11.9(L) 12.1 12.0  Hematocrit 36.0 - 46.0 % 37.3 39.1 38  Platelets 150.0 - 400.0 K/uL 364.0 379 341   CHA2DS2-VASc Score = 5  The patient's score is based upon: CHF History: 1 HTN History: 1 Diabetes History: 1 Stroke History: 0 Vascular Disease History: 0 Age Score: 1 Gender Score: 1     No results found for: VD25OH  Clinical ASCVD: No  The ASCVD Risk score (Arnett DK, et al., 2019) failed to calculate for the following reasons:   The valid total cholesterol range is 130 to 320 mg/dL    Depression screen [facility] 2/9 03/07/2019 07/20/2017 07/16/2015  Decreased Interest 0 0 0  Down, Depressed, Hopeless 0 1 0  PHQ - 2 Score 0 1 0      Social History   Tobacco Use  Smoking Status Former   Packs/day: 1.00   Years: 10.00   Pack years: 10.00   Types: Cigarettes   Quit date: 2005   Years since quitting: 17.7  Smokeless Tobacco Never   BP Readings from Last 3 Encounters:  06/28/20 124/76  04/15/20 (!) 143/71  03/05/20 128/80   Pulse Readings from Last 3 Encounters:  06/28/20 75  04/15/20 84  03/05/20 79   Wt Readings from Last 3 Encounters:  06/28/20 212 lb (96.2 kg)  06/14/20 208 lb 6.4 oz (94.5 kg)  04/15/20 205 lb (93 kg)   BMI Readings from Last 3 Encounters:  06/28/20 36.39 kg/m  06/14/20 35.77 kg/m  04/15/20 35.19 kg/m    Assessment/Interventions: Review of patient past medical history, allergies, medications, health status, including review of consultants reports, laboratory and other test data, was performed as part of comprehensive evaluation and provision of chronic care management services.   SDOH:  (Social Determinants of Health) assessments and interventions performed: Yes   SDOH Screenings   Alcohol Screen: Not on file  Depression (PHQ2-9): Not on file  Financial Resource Strain: Medium Risk   Difficulty of Paying Living Expenses: Somewhat hard  Food Insecurity: Not on file  Housing: Not on file   Physical Activity: Not on file  Social Connections: Not on file  Stress: Not on file  Tobacco Use: Medium Risk   Smoking Tobacco Use: Former   Smokeless Tobacco Use: Never  Transportation Needs: Not on file    [place]  Allergies  Allergen Reactions   Tramadol Nausea And Vomiting    Medications Reviewed Today     Reviewed by Alysia Penna, [facility] (Pharmacist) on 09/27/20 at 1108  Med List Status: <None>   Medication Order Taking? Sig Documenting Provider Last Dose Status Informant  Abatacept [facility]) [id number] Yes Inject into the skin every 30 (thirty) days. [provider] Taking Active  allopurinol (ZYLOPRIM) 300 MG tablet [id number] Yes TAKE 1 TABLET BY MOUTH EVERY DAY Gregor Hams, MD Taking Active   amLODipine (NORVASC) 10 MG tablet [id number] Yes TAKE 1 TABLET BY MOUTH EVERY DAY Patwardhan, Manish J, MD Taking Active   apixaban (ELIQUIS) 5 MG TABS tablet [id number] Yes Take 1 tablet (5 mg total) by mouth 2 (two) times daily. Nigel Mormon, MD Taking Active            Med Note Malena Catholic Jun 16, 2020 10:27 AM) Via BMS PAP  colchicine 0.6 MG tablet [id number] Yes TAKE 1 TABLET BY MOUTH DAILY AS NEEDED FOR PAIN Hoyt Koch, MD Taking Active   Dulaglutide (TRULICITY) 3 NI/6.2VO SOPN [id number] Yes Inject 3 mg into the skin once a week. Philemon Kingdom, MD Taking Active            Med Note Malena Catholic Jun 16, 2020 10:26 AM) Via [facility]  esomeprazole (NEXIUM) 40 MG capsule [id number] Yes Take 40 mg by mouth daily before breakfast. [provider] Taking Active Self  furosemide (LASIX) 20 MG tablet [id number] Yes TAKE 2 TABLETS (40 MG TOTAL) BY MOUTH 2 (TWO) TIMES DAILY. Patwardhan, Reynold Bowen, MD Taking Active   gabapentin (NEURONTIN) 300 MG capsule [id number] Yes 2 capsules BID and 1 capsule at bedtime (total 5 per day) Hoyt Koch, MD Taking Active   glipiZIDE (GLUCOTROL) 5 MG tablet [id number] Yes  TAKE 2 TABLETS (10 MG TOTAL) BY MOUTH IN AM AND % BEFORE SUPPER.  Patient taking differently: TAKE 2 TABLETS (10 MG TOTAL) BY MOUTH IN AM AND 1 BEFORE SUPPER.   Philemon Kingdom, MD Taking Active   hydrALAZINE (APRESOLINE) 25 MG tablet [id number] Yes TAKE 1 TABLET BY MOUTH THREE TIMES A DAY  Patient taking differently: Take 50 mg by mouth 3 (three) times daily.   Nigel Mormon, MD Taking Active   LEVEMIR FLEXTOUCH 100 UNIT/ML FlexTouch Pen [id number] Yes INJECT 50 UNITS INTO THE SKIN AT BEDTIME Philemon Kingdom, MD Taking Active   methimazole (TAPAZOLE) 5 MG tablet [id number] Yes Take 0.5 tablets (2.5 mg total) by mouth daily. Philemon Kingdom, MD Taking Active   metoprolol succinate (TOPROL-XL) 100 MG 24 hr tablet [id number] Yes Take 100 mg by mouth daily. [provider] Taking Active   rosuvastatin (CRESTOR) 10 MG tablet [id number] Yes TAKE 1 TABLET BY MOUTH EVERY DAY Patwardhan, Manish J, MD Taking Active   Vitamin D, Ergocalciferol, (DRISDOL) 1.25 MG (50000 UNIT) CAPS capsule [id number] Yes Take 50,000 Units by mouth every 7 (seven) days. [provider] Taking Active   VOLTAREN 1 % GEL [id number] Yes Apply 2 g topically 4 (four) times daily.  Patient taking differently: Apply 2 g topically as needed.   Hoyt Koch, MD Taking Active             Patient Active Problem List   Diagnosis Date Noted   Bruising 04/16/2020   Mixed hyperlipidemia 02/26/2019   Paroxysmal atrial fibrillation ([place]) 07/18/2018   H/O Graves' disease 07/18/2018   Yeast infection 03/19/2018   Abdominal pain 12/05/2017   Hyperglycemia due to type 2 diabetes mellitus ([place]) 07/14/2017   Fall at home, initial encounter 07/14/2017   Rheumatoid arthritis ([place]) 07/14/2017   Grief reaction 07/14/2017   Drug-induced hyperkalemia 07/14/2017   Myalgia 07/06/2017   Acute gout 05/15/2017   Hyperkalemia 05/11/2017   New onset of congestive heart failure ([place]) 04/21/2017   Chronic  respiratory failure ([place]) 04/21/2017   Leukocytosis 04/21/2017   History of atrial fibrillation    Cough 04/20/2017   Hyperthyroidism 04/18/2017   Atrial fibrillation with RVR ([place]) 04/15/2017   Routine general medical examination at a health care facility 10/20/2015   Diabetes mellitus type 2 with complications ([place]) 14/43/1540   Acute pain of left knee 01/28/2015   Hyperlipidemia associated with type 2 diabetes mellitus ([place]) 09/02/2014   Morbid obesity ([place]) 09/02/2014   Essential (primary) hypertension 10/04/2012    Immunization History  Administered Date(s) Administered   Fluad Quad(high Dose 65+) 09/22/2019   Influenza, High Dose Seasonal PF 10/20/2015, 11/02/2016, 10/26/2018   Influenza,inj,Quad PF,6+ Mos 10/27/2014   Influenza-Unspecified 10/16/2017   PFIZER(Purple Top)SARS-COV-2 Vaccination 02/07/2019, 02/28/2019, 10/15/2019, 05/21/2020   Pneumococcal Conjugate-13 12/30/2014   Pneumococcal Polysaccharide-23 10/29/2015   Zoster Recombinat (Shingrix) 10/26/2018, 05/21/2020    Conditions to be addressed/monitored:  Hypertension, Hyperlipidemia, Diabetes, Atrial Fibrillation, Heart Failure, and Gout  Care Plan : [place]  Updates made by Charlton Haws, [place] since 09/29/2020 12:00 AM     Problem: Hypertension, Hyperlipidemia, Diabetes, Atrial Fibrillation, Heart Failure, and Gout   Priority: High     Long-Range Goal: Disease management   Start Date: 06/16/2020  Expected End Date: 06/16/2021  This Visit's Progress: On track  Recent Progress: On track  Priority: High  Note:   Current Barriers:  Unable to independently monitor therapeutic efficacy  Pharmacist Clinical Goal(s):  Patient will achieve adherence to monitoring guidelines and medication adherence to achieve therapeutic efficacy through collaboration with PharmD and provider.   Interventions: 1:1 collaboration with Hoyt Koch, MD regarding development and update of  comprehensive plan of care as evidenced by provider attestation and co-signature Inter-disciplinary care team collaboration (see longitudinal plan of care) Comprehensive medication review performed; medication list updated in electronic medical record  Hyperlipidemia: (LDL goal < 70) -Controlled - LDL is at goal; pt endorses compliance and denies side effects -Current treatment: Rosuvastatin 10 mg daily -Current exercise habits: walks 3 days a week with cousins -Educated on Cholesterol goals; Benefits of statin for ASCVD risk reduction; -Recommended to continue current medication  Diabetes (A1c goal <7%) -Not ideally controlled - BG is higher than it has been in the past; pt reports she is not taking any glipizide at night, only in AM; -Current home glucose readings fasting glucose: 158 today, 132,151 post prandial glucose: 130-194 -DX 1987, insulin-dependent since 2006 -Current medications: Trulicity 3 mg weekly Glipizide 5 mg - 2 tab AM, 1 tab PM (pt not taking PM dose) Levemir 50 units HS  -Medications previously tried: metformin (AKI), Victoza, Bydureon -Counseled on prescribed dose of glipizide - 2 tab AM, 1 tab PM. Pt will add 5 mg dose back to PM  Atrial Fibrillation (Goal: prevent stroke and major bleeding) -Controlled - pt endorses compliance and denies issues; she endorses some bruising but has been reassured by cardiologist that this is not harmful -CHADSVASC: 5 -Current treatment: Rate control: Metoprolol 50 mg daily Anticoagulation: Eliquis 5 mg BID -Counseled on avoidance of NSAIDs due to increased bleeding risk with anticoagulants; -Recommended to continue current medication  Heart Failure / Hypertension (Goal: BP goal < 140/90,prevent exacerbations) -Not ideally controlled - pt is checking BP daily (reports her monitor sends readings to cardiologist) and it is above goal; she reports swelling is under control; she has hx of hyperkalemia requiring hospitalization  (07/2017) while on lisinopril, potassium supplement -Current home BP/HR readings: 143/81, 141/73, HR 70s -Last ejection  fraction: 60-65% (Date: 04/16/2017) -HF type: Diastolic -Current treatment: Amlodipine 10 mg daily Furosemide 20 mg -2 tab BID Hydralazine 25 mg TID Metoprolol succinate 100 mg daily -Medications previously tried: atenolol, diltiazem, isosorbide, lisinopril (hyperK), olmesartan, HCTZ, spironolactone -Educated on Benefits of medications for managing symptoms and prolonging life; Importance of blood pressure control -Counseled on low salt diet -Advised pt to contact her cardiologist regarding recent high BP; per chart she has done this and hydralazine was increased to 50 mg TID  Graves disease / hyperthyroidism (Goal: maintain TSH/T4 in goal range) - Controlled- recent TFTs were normal; pt endorses compliance with methimazole 1/2 tab -Current treatment  Methimazole 5 mg - 1/2 tab daily -Recommend to continue current medication  Gout (Goal: prevent flares) -Controlled - pt reports she has not had a flare since April 2021 when allopurinol was initially prescribed; she is not taking colchicine regularly -Current treatment  Allopurinol 300 mg daily Colchicine 0.6 mg daily PRN -Counseled on indication for allopurinol for gout prevention and colchicine for gout treatment -Recommend to continue current medication  Health Maintenance -Vaccine gaps: TDAP, Flu -Advised to get flu vaccine  Patient Goals/Self-Care Activities Patient will:  - take medications as prescribed -focus on medication adherence by pill box -check glucose daily -check blood pressure daily -Contact cardiology for BP advice      Medication Assistance:  Trulicity - [place] (through 01/01/21) - via Endocrine Eliquis - BMS PAP (through 01/01/21) - via Cardiology  Compliance/Adherence/Medication fill history: Care Gaps: Colonoscopy (never done) Eye exam (due 04/26/16) - pt reports she gets one  every year w/ Dr Catalina Antigua @ [place] [phone])  Star-Rating Drugs: Rosuvastatin - last fill 09/12/20 90D Glipizide - LF 08/13/20 x 90 ds  Patient's preferred pharmacy is:  [facility] [place] [address] [place] [address] Phone: [phone] Fax: [phone]  [id number] [place] [address] [place]: [phone]: [phone] Uses pill box? Yes Pt endorses 100% compliance  We discussed: Current pharmacy is preferred with insurance plan and patient is satisfied with pharmacy services Patient decided to: Continue current medication management strategy  Care Plan and Follow Up Patient Decision:  Patient agrees to Care Plan and Follow-up.  Plan: Telephone follow up appointment with care management team member scheduled for:  3 months  LCharlene Brooke PharmD, BSolen CPP Clinical Pharmacist [phone]

## 2020-09-27 NOTE — Telephone Encounter (Signed)
Pt called to review recent home BP readings. BP over the past week slightly trended up with home BP averaging ~146/86. Pt reports to be doing well overall and denies any changes in medications, diet, or lifestyle. Home BP previously stable and controlled ~130s/80s. Pt denies any complains of HA, neurofocal changes. Reports to have intermittent chest tightness that results in SOB and dyspnea symptoms. Reports feeling more fatigued and feeling more DOE over the past few months. Attest to feeling tired with mild-to-moderate activities. Denies any complains of lower extremity edema. Pt doesn't currently weight herself regularly. Reviewed and updated med list together. Pt currently on metoprolol 100 mg, hydralazine 25 mg TID, lasix 40 mg BID, amlodipine 10 mg for BP control. Pt agreeable to increase hydralazine dose to 50 mg TID and continue monitoring home BP readings. Pt to continue monitoring home BP readings and symptoms. If symptoms continue to persist or worsen pt agreeable to move f/u OV w/ Dr. Rosemary Holms earlier.

## 2020-09-29 NOTE — Patient Instructions (Signed)
Visit Information  Phone number for Pharmacist: [phone]   Goals Addressed             This Visit's Progress    Manage My Medicine       Timeframe:  Long-Range Goal Priority:  Medium Start Date:      06/16/20                       Expected End Date:    06/16/21                   Follow Up Date: Dec 2022   - call for medicine refill 2 or 3 days before it runs out - call if I am sick and can't take my medicine - keep a list of all the medicines I take; vitamins and herbals too - use a pillbox to sort medicine  -Take glipizide - 2 tab in AM and 1 tab with dinner -Call cardiology for BP med adjustment   Why is this important?   These steps will help you keep on track with your medicines.   Notes:         Care Plan : CCM Pharmacy Care Plan  Updates made by Kathyrn Sheriff, RPH since 09/29/2020 12:00 AM     Problem: Hypertension, Hyperlipidemia, Diabetes, Atrial Fibrillation, Heart Failure, and Gout   Priority: High     Long-Range Goal: Disease management   Start Date: 06/16/2020  Expected End Date: 06/16/2021  This Visit's Progress: On track  Recent Progress: On track  Priority: High  Note:   Current Barriers:  Unable to independently monitor therapeutic efficacy  Pharmacist Clinical Goal(s):  Patient will achieve adherence to monitoring guidelines and medication adherence to achieve therapeutic efficacy through collaboration with PharmD and provider.   Interventions: 1:1 collaboration with Myrlene Broker, MD regarding development and update of comprehensive plan of care as evidenced by provider attestation and co-signature Inter-disciplinary care team collaboration (see longitudinal plan of care) Comprehensive medication review performed; medication list updated in electronic medical record  Hyperlipidemia: (LDL goal < 70) -Controlled - LDL is at goal; pt endorses compliance and denies side effects -Current treatment: Rosuvastatin 10 mg  daily -Current exercise habits: walks 3 days a week with cousins -Educated on Cholesterol goals; Benefits of statin for ASCVD risk reduction; -Recommended to continue current medication  Diabetes (A1c goal <7%) -Not ideally controlled - BG is higher than it has been in the past; pt reports she is not taking any glipizide at night, only in AM; -Current home glucose readings fasting glucose: 158 today, 132,151 post prandial glucose: 130-194 -DX 1987, insulin-dependent since 2006 -Current medications: Trulicity 3 mg weekly Glipizide 5 mg - 2 tab AM, 1 tab PM (pt not taking PM dose) Levemir 50 units HS  -Medications previously tried: metformin (AKI), Victoza, Bydureon -Counseled on prescribed dose of glipizide - 2 tab AM, 1 tab PM. Pt will add 5 mg dose back to PM  Atrial Fibrillation (Goal: prevent stroke and major bleeding) -Controlled - pt endorses compliance and denies issues; she endorses some bruising but has been reassured by cardiologist that this is not harmful -CHADSVASC: 5 -Current treatment: Rate control: Metoprolol 50 mg daily Anticoagulation: Eliquis 5 mg BID -Counseled on avoidance of NSAIDs due to increased bleeding risk with anticoagulants; -Recommended to continue current medication  Heart Failure / Hypertension (Goal: BP goal < 140/90,prevent exacerbations) -Not ideally controlled - pt is checking BP daily (reports her monitor sends  readings to cardiologist) and it is above goal; she reports swelling is under control; she has hx of hyperkalemia requiring hospitalization (07/2017) while on lisinopril, potassium supplement -Current home BP/HR readings: 143/81, 141/73, HR 70s -Last ejection fraction: 60-65% (Date: 04/16/2017) -HF type: Diastolic -Current treatment: Amlodipine 10 mg daily Furosemide 20 mg -2 tab BID Hydralazine 25 mg TID Metoprolol succinate 100 mg daily -Medications previously tried: atenolol, diltiazem, isosorbide, lisinopril (hyperK), olmesartan,  HCTZ, spironolactone -Educated on Benefits of medications for managing symptoms and prolonging life; Importance of blood pressure control -Counseled on low salt diet -Advised pt to contact her cardiologist regarding recent high BP; per chart she has done this and hydralazine was increased to 50 mg TID  Graves disease / hyperthyroidism (Goal: maintain TSH/T4 in goal range) - Controlled- recent TFTs were normal; pt endorses compliance with methimazole 1/2 tab -Current treatment  Methimazole 5 mg - 1/2 tab daily -Recommend to continue current medication  Gout (Goal: prevent flares) -Controlled - pt reports she has not had a flare since April 2021 when allopurinol was initially prescribed; she is not taking colchicine regularly -Current treatment  Allopurinol 300 mg daily Colchicine 0.6 mg daily PRN -Counseled on indication for allopurinol for gout prevention and colchicine for gout treatment -Recommend to continue current medication  Health Maintenance -Vaccine gaps: TDAP, Flu -Advised to get flu vaccine  Patient Goals/Self-Care Activities Patient will:  - take medications as prescribed -focus on medication adherence by pill box -check glucose daily -check blood pressure daily -Contact cardiology for BP advice      The patient verbalized understanding of instructions, educational materials, and care plan provided today and declined offer to receive copy of patient instructions, educational materials, and care plan.  Telephone follow up appointment with pharmacy team member scheduled for: 3 months  Al Corpus, PharmD, [place], CPP Clinical Pharmacist [facility] [facility] [phone]

## 2020-10-01 DIAGNOSIS — I480 Paroxysmal atrial fibrillation: Secondary | ICD-10-CM

## 2020-10-01 DIAGNOSIS — E118 Type 2 diabetes mellitus with unspecified complications: Secondary | ICD-10-CM

## 2020-10-01 DIAGNOSIS — I5032 Chronic diastolic (congestive) heart failure: Secondary | ICD-10-CM

## 2020-10-01 DIAGNOSIS — I10 Essential (primary) hypertension: Secondary | ICD-10-CM

## 2020-10-12 ENCOUNTER — Other Ambulatory Visit: Payer: Self-pay | Admitting: Cardiology

## 2020-10-12 DIAGNOSIS — I10 Essential (primary) hypertension: Secondary | ICD-10-CM

## 2020-10-15 ENCOUNTER — Ambulatory Visit (INDEPENDENT_AMBULATORY_CARE_PROVIDER_SITE_OTHER): Payer: Medicare Other | Admitting: Internal Medicine

## 2020-10-15 ENCOUNTER — Other Ambulatory Visit: Payer: Self-pay

## 2020-10-15 ENCOUNTER — Encounter: Payer: Self-pay | Admitting: Internal Medicine

## 2020-10-15 VITALS — BP 146/80 | HR 85 | Ht 64.0 in | Wt 212.2 lb

## 2020-10-15 DIAGNOSIS — E785 Hyperlipidemia, unspecified: Secondary | ICD-10-CM

## 2020-10-15 DIAGNOSIS — E0500 Thyrotoxicosis with diffuse goiter without thyrotoxic crisis or storm: Secondary | ICD-10-CM | POA: Diagnosis not present

## 2020-10-15 DIAGNOSIS — E118 Type 2 diabetes mellitus with unspecified complications: Secondary | ICD-10-CM | POA: Diagnosis not present

## 2020-10-15 DIAGNOSIS — E1169 Type 2 diabetes mellitus with other specified complication: Secondary | ICD-10-CM | POA: Diagnosis not present

## 2020-10-15 LAB — POCT GLYCOSYLATED HEMOGLOBIN (HGB A1C): Hemoglobin A1C: 9.5 % — AB (ref 4.0–5.6)

## 2020-10-15 LAB — T4, FREE: Free T4: 0.81 ng/dL (ref 0.60–1.60)

## 2020-10-15 LAB — T3, FREE: T3, Free: 3.2 pg/mL (ref 2.3–4.2)

## 2020-10-15 LAB — TSH: TSH: 2.9 u[IU]/mL (ref 0.35–5.50)

## 2020-10-15 MED ORDER — GLIPIZIDE 5 MG PO TABS: ORAL_TABLET | ORAL | 3 refills | Status: DC

## 2020-10-15 NOTE — Progress Notes (Signed)
Called patient but no answer. Left vm to call us back

## 2020-10-15 NOTE — Progress Notes (Addendum)
Patient ID: REIDA HEM, female   DOB: June 12, 1945, 75 y.o.   MRN: [id number]  This visit occurred during the SARS-CoV-2 public health emergency.  Safety protocols were in place, including screening questions prior to the visit, additional usage of staff PPE, and extensive cleaning of exam room while observing appropriate contact time as indicated for disinfecting solutions.   HPI: ANALYCE TAVARES is a 75 y.o.-year-old female, presenting for f/u for DM2, dx in 1987, insulin-dependent since 2006, uncontrolled, with complications (PN, stage 2-3 CKD)and Graves ds. Last visit 3.5 months ago.  Interim history: No increased urination, blurry vision, nausea, chest pain.  Graves ds  -Diagnosed during the admission for A. fib with RVR in 05/2017.  Reviewed her TFTs: Lab Results  Component Value Date   TSH 1.37 07/14/2020   TSH 3.94 06/28/2020   TSH 4.90 (H) 04/26/2020   TSH 5.06 (H) 02/26/2020   TSH 3.03 09/19/2019   TSH 0.46 06/05/2019   TSH <0.01 (L) 04/22/2019   TSH <0.01 (L) 03/03/2019   TSH <0.005 (L) 02/26/2019   TSH 10.31 (H) 12/04/2017   Lab Results  Component Value Date   FREET4 1.30 07/14/2020   FREET4 0.79 06/28/2020   FREET4 0.71 04/26/2020   FREET4 0.67 02/26/2020   FREET4 0.61 09/19/2019   FREET4 0.63 06/05/2019   FREET4 0.76 04/22/2019   FREET4 1.98 (H) 03/03/2019   FREET4 0.57 (L) 12/04/2017   FREET4 0.71 (L) 07/15/2017   Lab Results  Component Value Date   T3FREE 3.5 07/14/2020   T3FREE 3.4 06/28/2020   T3FREE 3.0 04/26/2020   T3FREE 3.0 02/26/2020   T3FREE 3.1 09/19/2019   T3FREE 3.2 06/05/2019   T3FREE 3.3 04/22/2019   T3FREE 4.4 (H) 03/03/2019   T3FREE 2.8 12/04/2017   T3FREE 2.1 07/15/2017   Graves' antibodies were elevated Lab Results  Component Value Date   TSI 8.02 (H) 04/16/2017   She was started on methimazole 10 mg 2x a day and atenolol 50 mg 3x a day.Her TFTs were significantly improved >> we decreased the methimazole to 5 mg 2X a day,  but then on 5 mg daily (? When changed - by PCP? -Patient cannot remember exactly when she started her prescription for methimazole only mentioned to take it once a day...).   12/2017: We stopped methimazole as the TSH was 10.   02/2019: Patient finally return for labs: TSH was suppressed completely 03/2019: We restarted methimazole 5 mg twice a day  03/2020: We decreased the methimazole dose to 5 mg daily 04/2020: I advised her to decrease the methimazole dose to 2.5 mg daily >> SHE FORGOT, so she continued on 5 mg daily 06/2020: Decreased MMI to 2.5 mg daily  She continues Toprol-XL.  Pt denies: - feeling nodules in neck - hoarseness - dysphagia - choking - SOB with lying down  No FH of thyroid disease or thyroid cancer. No h/o radiation tx to head or neck.  No herbal supplements. No Biotin use. No recent steroids use.   DM2: Reviewed HbA1c levels Lab Results  Component Value Date   HGBA1C 6.1 (A) 06/28/2020   HGBA1C 6.8 (H) 02/26/2020   HGBA1C 8.1 (A) 11/25/2019   HGBA1C 8.6 (A) 09/19/2019   HGBA1C 7.3 (A) 06/05/2019   HGBA1C 8.8 (H) 02/26/2019   HGBA1C 13.0 04/26/2018   HGBA1C 14.1 (H) 03/19/2018   HGBA1C 13.1 (A) 12/28/2017   HGBA1C 9.8 (H) 07/14/2017   HGBA1C 8.0 02/01/2016   HGBA1C 8.3 (H) 10/20/2015  HGBA1C 8.4 07/29/2015   HGBA1C 10.0 04/27/2015   HGBA1C 9.5 (H) 12/29/2014   HGBA1C 9.1 (H) 09/01/2014   She is on: - Glipizide 10 g 2x a day before meals >> 10 mg in a.m.  (not taking this) - Levemir 40 >> 50 >> 40-50 >> 40 >> 50 units at night  - Trulicity 1.5 mg weekly - started 12/2018 >> 3 mg weekly  She was previously on a VGo patch pump.   She also tried Bydureon (02/2013-10/2014) >> made her hungry, did not lower sugars  She checks sugars once a day: - am: 73-120, 131, 168 >>  73-114 >> 91-151, 202 - 2h after b'fast: n/c >> 167 >> 99-164 >> 144 - before lunch: 221 (cantaloupe) >> n/c >> 136, 158 >> n/c - 2h after lunch: 165-185 >> n/c >> 136, 140 >>  340 - before dinner: 120-125 >> ? >> 134-144 >> 108, 191 >> n/c - 2h after dinner: n/c >> 175-201 >> n/c >> 194  >> 205 - bedtime: 192, 265 >> 130-137 >> ? >> 207 >> n/c - nighttime: n/c Lowest sugar was LO, 48 (gastroenteritis) 02/2020 >> 73 >> 91; she has hypoglycemia awareness in the 70s. Highest sugar was 298 ... >> 207 (Chick fil-a) >> 194 >> 340 (cake)  Glucometer: AccuChek  -+ CKD, last BUN/creatinine:  Lab Results  Component Value Date   BUN 18 02/26/2020   CREATININE 1.36 (H) 02/26/2020  04/25/2018: Glucose 269, BUN/creatinine 18/1.05 Lab Results  Component Value Date   MICRALBCREAT 110.0 (H) 09/19/2019   MICRALBCREAT 192.6 (H) 06/05/2019  Previously on olmesartan, now off (?)  -+ HL; last set of lipids: Lab Results  Component Value Date   CHOL 106 02/26/2020   HDL 38.20 (L) 02/26/2020   LDLCALC 49 02/26/2020   LDLDIRECT 98.0 03/19/2018   TRIG 96.0 02/26/2020   CHOLHDL 3 02/26/2020  On Crestor 10.  - last eye exam was in 03/21/2020: No DR reportedly.  - no numbness and tingling in her feet.   She previously had hyperkalemia, which we addressed in the past.  This resolved. - Possibly 2/2 ACE inhibitor, potassium supplementation +/-beta-blocker - After an initial K of 7.5 in 07/2017 >> K levels normalized after stopping ACEI and K supplements Lab Results  Component Value Date   K 3.8 02/26/2020   K 4.9 02/26/2019   K 4.1 04/25/2018   K 3.9 03/19/2018   K 4.5 11/28/2017  Of note, a cosyntropin stimulation test was normal and also aldosterone and renin were normal while in the hospital.   She had AKI on admission then.  Her son died in 07-10-2017-he had severe heart failure.  ROS: + See HPI  I reviewed pt's medications, allergies, PMH, social hx, family hx, and changes were documented in the history of present illness. Otherwise, unchanged from my initial visit note.  Past Medical History:  Diagnosis Date   Afib (HCC)    Diabetes mellitus    Graves  disease    Heart failure (HCC)    Hypertension    Hyperthyroidism    Past Surgical History:  Procedure Laterality Date   BREAST SURGERY     Social History   Social History   Marital Status: Married    Spouse Name: N/A   Number of Children: 1   Occupational History   Customer svc rep   Social History Main Topics   Smoking status: Never Smoker    Smokeless tobacco: Not on file  Alcohol Use: No   Drug Use: No   Current Outpatient Medications on File Prior to Visit  Medication Sig Dispense Refill   Abatacept (ORENCIA [place]) Inject into the skin every 30 (thirty) days.     allopurinol (ZYLOPRIM) 300 MG tablet TAKE 1 TABLET BY MOUTH EVERY DAY 90 tablet 1   amLODipine (NORVASC) 10 MG tablet TAKE 1 TABLET BY MOUTH EVERY DAY 90 tablet 1   apixaban (ELIQUIS) 5 MG TABS tablet Take 1 tablet (5 mg total) by mouth 2 (two) times daily. 180 tablet 3   colchicine 0.6 MG tablet TAKE 1 TABLET BY MOUTH DAILY AS NEEDED FOR PAIN 90 tablet 1   Dulaglutide (TRULICITY) 3 MG/0.5ML SOPN Inject 3 mg into the skin once a week. 2 mL 3   esomeprazole (NEXIUM) 40 MG capsule Take 40 mg by mouth daily before breakfast.     furosemide (LASIX) 20 MG tablet TAKE 2 TABLETS (40 MG TOTAL) BY MOUTH 2 (TWO) TIMES DAILY. 360 tablet 1   gabapentin (NEURONTIN) 300 MG capsule 2 capsules BID and 1 capsule at bedtime (total 5 per day) 150 capsule 5   glipiZIDE (GLUCOTROL) 5 MG tablet TAKE 2 TABLETS (10 MG TOTAL) BY MOUTH IN AM AND % BEFORE SUPPER. (Patient taking differently: TAKE 2 TABLETS (10 MG TOTAL) BY MOUTH IN AM AND 1 BEFORE SUPPER.) 270 tablet 3   hydrALAZINE (APRESOLINE) 25 MG tablet TAKE 1 TABLET BY MOUTH THREE TIMES A DAY (Patient taking differently: Take 50 mg by mouth 3 (three) times daily.) 270 tablet 0   LEVEMIR FLEXTOUCH 100 UNIT/ML FlexTouch Pen INJECT 50 UNITS INTO THE SKIN AT BEDTIME 15 mL 0   methimazole (TAPAZOLE) 5 MG tablet Take 0.5 tablets (2.5 mg total) by mouth daily. 45 tablet 0   metoprolol  succinate (TOPROL-XL) 100 MG 24 hr tablet Take 100 mg by mouth daily.     rosuvastatin (CRESTOR) 10 MG tablet TAKE 1 TABLET BY MOUTH EVERY DAY 90 tablet 1   Vitamin D, Ergocalciferol, (DRISDOL) 1.25 MG (50000 UNIT) CAPS capsule Take 50,000 Units by mouth every 7 (seven) days.     VOLTAREN 1 % GEL Apply 2 g topically 4 (four) times daily. (Patient taking differently: Apply 2 g topically as needed.) 100 g 3   No current facility-administered medications on file prior to visit.   Allergies  Allergen Reactions   Tramadol Nausea And Vomiting    FH - see HPI  PE: BP (!) 146/80 (BP Location: Right Arm, Patient Position: Sitting, Cuff Size: Normal)   Pulse 85   Ht 5\' 4"  (1.626 m)   Wt 212 lb 3.2 oz (96.3 kg)   SpO2 96%   BMI 36.42 kg/m  Wt Readings from Last 3 Encounters:  10/15/20 212 lb 3.2 oz (96.3 kg)  06/28/20 212 lb (96.2 kg)  06/14/20 208 lb 6.4 oz (94.5 kg)   Constitutional: overweight, in NAD Eyes: PERRLA, EOMI, no exophthalmos ENT: moist mucous membranes, no thyromegaly, no cervical lymphadenopathy Cardiovascular: RRR, No MRG Respiratory: CTA B Gastrointestinal: abdomen soft, NT, ND, BS+ Musculoskeletal: no deformities, strength intact in all 4 Skin: moist, warm, no rashes Neurological: no tremor with outstretched hands, DTR normal in all 4  ASSESSMENT: 1. 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  Graves' disease  3.  HL  We previously also addressed her hyperkalemia: - in 07/2017, she had a critical value of potassium of 7.5.  She was admitted with high potassium since then >>  Lasix, potassium supplementation, and lisinopril were stopped. - A cosyntropin stimulation test performed in the hospital was normal, ruling out primary adrenal insufficiency as a cause for hyperkalemia - Aldosterone and renin levels were normal during her last hospitalization - On her medication list, I do not see any possible culprits,  other than  possibly propranolol - I suggested a referral to nephrology  if her potassium continues to remain elevated after stopping ACE inhibitor and potassium supplementation - Potassium normalized afterwards  PLAN:  1. Patient with longstanding, previously uncontrolled, type 2 diabetes, on oral antidiabetic regimen with sulfonylurea and also long-acting insulin and weekly GLP-1 receptor agonist, with improved control after starting Trulicity.  At last visit, sugars were at goal but she had many values in the 70s in the morning and they were slightly higher later in the day but she was not checking consistently in the evening.  I advised her to do so but we also decrease the glipizide before dinner to avoid low blood sugars overnight.  HbA1c at that time was at goal, at 6.8%. -At today's visit, sugars are higher in the morning, with few values above target.  Later in the day, she had some high blood sugar checks, but she is checking only rarely.  I advised her to try to check more frequently during the day, rotating check times.  She tells me that she is only taking glipizide in the morning as she misunderstood the instructions and stopped her glipizide before dinner completely.  At this visit, we will add 5 mg of glipizide before dinner.  We discussed about increasing Trulicity to 4.5 mg weekly, but she gets it through the patient assistance program so this may take some time to be able to implement.  As of now, states HbA1c is much higher (see below) we will check a fructosamine level.  If this is high, we may need to increase Trulicity; we also discussed about the importance of improving diet. - I suggested to:  Patient Instructions  Please continue: - Levemir 50 mg at night - Trulicity 3 mg weekly  Please increase: - Glipizide 10 mg before b'fast and 5 mg before dinner  Please continue: - Methimazole 2.5 mg daily  Please stop at the lab.  Please return in 3 months with your sugar log.   - we  checked her HbA1c: 9.5% (MUCH higher) -not completely correlating with her blood sugars at home.  We will check a fructosamine level, also. - advised to check sugars at different times of the day - 1-2x a day, rotating check times - advised for yearly eye exams >> she is UTD - return to clinic in 3-4 months  2.  Graves' disease -Her hyperthyroidism was diagnosed after she was admitted with A. fib with RVR and acute pulmonary edema in 2019 -We diagnosed Graves' disease based on elevated TSI antibodies and also clinical course -We started methimazole initially, however, we then had to stop this completely after her TSH increased to 10 and then restarted in 03/2019 at 5 mg twice a day as a TSH returned suppressed.  Afterwards, we were able to decrease methimazole further. -At last visit, we decreased the methimazole dose from 5 mg to 2.5 mg daily as TSH was close to the upper limit of normal.  Subsequent TFTs were normal: Lab Results  Component Value Date   TSH 1.37 07/14/2020  -At today's visit, no hyperthyroid symptoms including weight loss, palpitations, anxiety, heat intolerance.  Before last visit she gained  7 pounds. -We will repeat her TFTs and adjust methimazole accordingly  3. HL -Reviewed latest lipid panel from 02/2020: LDL at goal, improved, HDL slightly low: Lab Results  Component Value Date   CHOL 106 02/26/2020   HDL 38.20 (L) 02/26/2020   LDLCALC 49 02/26/2020   LDLDIRECT 98.0 03/19/2018   TRIG 96.0 02/26/2020   CHOLHDL 3 02/26/2020  -Continues Crestor 10 mg daily without side effects  Component     Latest Ref Rng & Units 10/15/2020  TSH     0.35 - 5.50 uIU/mL 2.90  T4,Free(Direct)     0.60 - 1.60 ng/dL 5.17  Triiodothyronine,Free,Serum     2.3 - 4.2 pg/mL 3.2  TFTs remain normal.  At this point, I will advise her to continue the same dose of methimazole and will repeat the tests at next visit.   Component     Latest Ref Rng & Units 10/15/2020  Hemoglobin A1C      4.0 - 5.6 % 9.5 (A)  Fructosamine     205 - 285 umol/L 321 (H)  HbA1c calculated from fructosamine is much better, at 7.45%, correlating better with her blood sugars at home.   Carlus Pavlov, MD PhD [facility]

## 2020-10-15 NOTE — Patient Instructions (Addendum)
Please continue: - Levemir 50 mg at night - Trulicity 3 mg weekly  Please increase: - Glipizide 10 mg before b'fast and 5 mg before dinner  Please continue: - Methimazole 2.5 mg daily  Please stop at the lab.  Please return in 3 months with your sugar log.

## 2020-10-17 ENCOUNTER — Other Ambulatory Visit: Payer: Self-pay | Admitting: Cardiology

## 2020-10-17 ENCOUNTER — Other Ambulatory Visit: Payer: Self-pay | Admitting: Internal Medicine

## 2020-10-17 DIAGNOSIS — E0500 Thyrotoxicosis with diffuse goiter without thyrotoxic crisis or storm: Secondary | ICD-10-CM

## 2020-10-21 LAB — FRUCTOSAMINE: Fructosamine: 321 umol/L — ABNORMAL HIGH (ref 205–285)

## 2020-10-23 ENCOUNTER — Other Ambulatory Visit: Payer: Self-pay | Admitting: Internal Medicine

## 2020-11-01 ENCOUNTER — Encounter: Payer: Self-pay | Admitting: Pharmacist

## 2020-11-01 NOTE — Progress Notes (Signed)
CARE PLAN ENTRY  11/01/2020 Name: Donna Booker MRN: [id number] DOB: Apr 08, 1945  Donna Booker Floor is enrolled in Remote Patient Monitoring/Principle Care Monitoring.  Date of Enrollment: 03/14/19 Supervising physician: Truett Mainland Indication: HTN  Remote Readings: Compliant and Avg BP: 141/82, HR:81  Next scheduled OV: 02/03/21  Pharmacist Clinical Goal(s):  Over the next 90 days, patient will demonstrate Improved medication adherence as evidenced by medication fill history Over the next 90 days, patient will demonstrate improved understanding of prescribed medications and rationale for usage as evidenced by patient teach back Over the next 90 days, patient will experience decrease in ED visits. ED visits in last 6 months = 0 Over the next 90 days, patient will not experience hospital admission. Hospital Admissions in last 6 months = 0  Interventions: Provider and Inter-disciplinary care team collaboration (see longitudinal plan of care) Comprehensive medication review performed. Discussed plans with patient for ongoing care management follow up and provided patient with direct contact information for care management team Collaboration with provider re: medication management  Patient Self Care Activities:  Self administers medications as prescribed Attends all scheduled provider appointments Performs ADL's independently Performs IADL's independently  Allergies  Allergen Reactions   Tramadol Nausea And Vomiting   Outpatient Encounter Medications as of 11/01/2020  Medication Sig Note   Abatacept (ORENCIA [place]) Inject into the skin every 30 (thirty) days.    allopurinol (ZYLOPRIM) 300 MG tablet TAKE 1 TABLET BY MOUTH EVERY DAY    amLODipine (NORVASC) 10 MG tablet TAKE 1 TABLET BY MOUTH EVERY DAY    apixaban (ELIQUIS) 5 MG TABS tablet Take 1 tablet (5 mg total) by mouth 2 (two) times daily. 06/16/2020: Via [facility] PAP   colchicine 0.6 MG tablet TAKE 1 TABLET BY MOUTH DAILY AS  NEEDED FOR PAIN    Dulaglutide (TRULICITY) 3 MG/0.5ML SOPN Inject 3 mg into the skin once a week. 06/16/2020: Via [employer]   esomeprazole (NEXIUM) 40 MG capsule Take 40 mg by mouth daily before breakfast.    furosemide (LASIX) 20 MG tablet TAKE 2 TABLETS (40 MG TOTAL) BY MOUTH 2 (TWO) TIMES DAILY.    gabapentin (NEURONTIN) 300 MG capsule 2 capsules BID and 1 capsule at bedtime (total 5 per day)    glipiZIDE (GLUCOTROL) 5 MG tablet TAKE 2 TABLETS (10 MG TOTAL) BY MOUTH IN AM AND 1 BEFORE SUPPER.    hydrALAZINE (APRESOLINE) 25 MG tablet TAKE 1 TABLET BY MOUTH THREE TIMES A DAY    LEVEMIR FLEXTOUCH 100 UNIT/ML FlexTouch Pen INJECT 50 UNITS INTO THE SKIN AT BEDTIME    methimazole (TAPAZOLE) 5 MG tablet TAKE 1/2 TABLET BY MOUTH DAILY    metoprolol succinate (TOPROL-XL) 100 MG 24 hr tablet Take 100 mg by mouth daily.    rosuvastatin (CRESTOR) 10 MG tablet TAKE 1 TABLET BY MOUTH EVERY DAY    Vitamin D, Ergocalciferol, (DRISDOL) 1.25 MG (50000 UNIT) CAPS capsule Take 50,000 Units by mouth every 7 (seven) days.    VOLTAREN 1 % GEL Apply 2 g topically 4 (four) times daily. (Patient taking differently: Apply 2 g topically as needed.)    No facility-administered encounter medications on file as of 11/01/2020.    Hypertension   BP goal is:  <140/90  Office blood pressures are  BP Readings from Last 3 Encounters:  10/15/20 (!) 146/80  06/28/20 124/76  04/15/20 (!) 143/71    Patient is currently controlled on the following medications: hydralazine 25 mg TID, amlodipine 10 mg, metoprolol 100 mg,  lasix 40 mg  Patient checks BP at home daily  Patient home BP readings are ranging: 127-151/76-91  We discussed diet and exercise extensively  Plan  Continue current medications and control with diet and exercise  ______________ Visit Information SDOH (Social Determinants of Health) assessments performed: Yes.  Donna Booker was given information about Principle Care Management/Remote Patient  Monitoring services today including:  RPM/PCM service includes personalized support from designated clinical staff supervised by her physician, including individualized plan of care and coordination with other care providers 24/7 contact phone numbers for assistance for urgent and routine care needs. Standard insurance, coinsurance, copays and deductibles apply for principle care management only during months in which we provide at least 30 minutes of these services. Most insurances cover these services at 100%, however patients may be responsible for any copay, coinsurance and/or deductible if applicable. This service may help you avoid the need for more expensive face-to-face services. Only one practitioner may furnish and bill the service in a calendar month. The patient may stop PCM/RPM services at any time (effective at the end of the month) by phone call to the office staff.  Patient agreed to services and verbal consent obtained.   Cassell Clement, Pharm.D. Clinical Pharmacist [facility] [phone] Ext: [phone]

## 2020-11-07 ENCOUNTER — Other Ambulatory Visit: Payer: Self-pay | Admitting: Cardiology

## 2020-11-07 ENCOUNTER — Other Ambulatory Visit: Payer: Self-pay | Admitting: Internal Medicine

## 2020-11-07 DIAGNOSIS — E782 Mixed hyperlipidemia: Secondary | ICD-10-CM

## 2020-11-07 DIAGNOSIS — R600 Localized edema: Secondary | ICD-10-CM

## 2020-11-07 DIAGNOSIS — E0500 Thyrotoxicosis with diffuse goiter without thyrotoxic crisis or storm: Secondary | ICD-10-CM

## 2020-12-06 ENCOUNTER — Ambulatory Visit: Payer: Medicare Other

## 2020-12-06 ENCOUNTER — Ambulatory Visit: Payer: Medicare Other | Admitting: Cardiology

## 2020-12-06 ENCOUNTER — Other Ambulatory Visit: Payer: Self-pay | Admitting: Cardiology

## 2020-12-06 ENCOUNTER — Other Ambulatory Visit: Payer: Self-pay | Admitting: Family Medicine

## 2020-12-06 ENCOUNTER — Telehealth: Payer: Self-pay | Admitting: Cardiology

## 2020-12-06 ENCOUNTER — Encounter: Payer: Self-pay | Admitting: Cardiology

## 2020-12-06 ENCOUNTER — Other Ambulatory Visit: Payer: Self-pay

## 2020-12-06 VITALS — BP 137/79 | HR 84 | Temp 98.0°F | Resp 16 | Ht 64.0 in | Wt 217.0 lb

## 2020-12-06 DIAGNOSIS — R0609 Other forms of dyspnea: Secondary | ICD-10-CM | POA: Insufficient documentation

## 2020-12-06 MED ORDER — ISOSORBIDE MONONITRATE ER 30 MG PO TB24: 30.0000 mg | ORAL_TABLET | Freq: Every day | ORAL | 3 refills | Status: DC

## 2020-12-06 NOTE — Progress Notes (Signed)
Follow up visit  Subjective:   Donna Booker, female    DOB: 11-06-1945, 75 y.o.   MRN: [id number]   Chief Complaint  Patient presents with   Shortness of Breath     HPI  75 year old African American female withf Graves' disease with hyperthyroidism, paroxysmal atrial fibrillation, mild PH, HFpEF (secondary to Afib), uncontrolled type II diabetes mellitus with diabetic polyneuropathy, CKD stage III.  Patient made an urgent visit today due to complaints of dyspnea on exertion for last 3 weeks. She also has left sided sharp chest pains-lasting only for few seconds, but feeling of something "stuck in throat", that is worse on walking. She has been very tired and fatigued.   Echocardiogram performed today, reviewed with the patient.  He does follow.  Current Outpatient Medications on File Prior to Visit  Medication Sig Dispense Refill   Abatacept (ORENCIA [place]) Inject into the skin every 30 (thirty) days.     allopurinol (ZYLOPRIM) 300 MG tablet TAKE 1 TABLET BY MOUTH EVERY DAY 90 tablet 1   amLODipine (NORVASC) 10 MG tablet TAKE 1 TABLET BY MOUTH EVERY DAY 90 tablet 1   apixaban (ELIQUIS) 5 MG TABS tablet Take 1 tablet (5 mg total) by mouth 2 (two) times daily. 180 tablet 3   colchicine 0.6 MG tablet TAKE 1 TABLET BY MOUTH DAILY AS NEEDED FOR PAIN 90 tablet 1   Dulaglutide (TRULICITY) 3 KT/6.2BW SOPN Inject 3 mg into the skin once a week. 2 mL 3   esomeprazole (NEXIUM) 40 MG capsule Take 40 mg by mouth daily before breakfast.     furosemide (LASIX) 20 MG tablet TAKE 2 TABLETS BY MOUTH 2 TIMES DAILY. 360 tablet 1   gabapentin (NEURONTIN) 300 MG capsule 2 capsules BID and 1 capsule at bedtime (total 5 per day) 150 capsule 5   glipiZIDE (GLUCOTROL) 5 MG tablet TAKE 2 TABLETS (10 MG TOTAL) BY MOUTH IN AM AND 1 BEFORE SUPPER. 270 tablet 3   hydrALAZINE (APRESOLINE) 25 MG tablet TAKE 1 TABLET BY MOUTH THREE TIMES A DAY 270 tablet 0   LEVEMIR FLEXTOUCH 100 UNIT/ML FlexTouch Pen INJECT 50  UNITS INTO THE SKIN AT BEDTIME 45 mL 1   methimazole (TAPAZOLE) 5 MG tablet TAKE 1/2 TABLET BY MOUTH DAILY 45 tablet 0   metoprolol succinate (TOPROL-XL) 100 MG 24 hr tablet TAKE 1 TABLET (100 MG TOTAL) BY MOUTH DAILY. TAKE WITH OR IMMEDIATELY FOLLOWING A MEAL. 90 tablet 2   rosuvastatin (CRESTOR) 10 MG tablet TAKE 1 TABLET BY MOUTH EVERY DAY 90 tablet 1   Vitamin D, Ergocalciferol, (DRISDOL) 1.25 MG (50000 UNIT) CAPS capsule Take 50,000 Units by mouth every 7 (seven) days.     VOLTAREN 1 % GEL Apply 2 g topically 4 (four) times daily. (Patient taking differently: Apply 2 g topically as needed.) 100 g 3   No current facility-administered medications on file prior to visit.    Cardiovascular studies:  EKG 12/06/2020: Sinus rhythm 81 bpm Borderline left atrial enlargement  Echocardiogram 12/06/2020: Left ventricle cavity is normal in size. Moderate concentric hypertrophy of the left ventricle. Normal global wall motion. Normal LV systolic function with EF 62%. Doppler evidence of grade I (impaired) diastolic dysfunction, normal LAP.  Trace MR, trace TR. No evidence of pulmonary hypertension. Compared to previous study in 2019, atrial dilatation, mild PH now absent.  Renal artery duplex ([facility]) 05/23/2017: No renal artery stenosis  Recent labs: 10/2020: TSH 2.9 normal  02/26/2020: Glucose 48, BUN/Cr 18/1.36. EGFR  38. Na/K 142/3.8.  Rest of the CMP normal H/H 11/37. MCV 72. Platelets 364 HbA1C 9.5% Chol 106, TG 96, HDL 38, LDL 49  11/25/2019: HbA1C 8.1%  09/2019: TSH 3.1  06/05/2019: HbA1C 7.3% Chol 149, TG 119, HDL 45, LDL 80 TSH 0.46 normal  04/25/2018: Glucose 269, BUN/Cr 18/1.1. EGFR 58. Na/K 142/4.1. Rest of the CMP normal H/H 12/37. MCV 75. Platelets 306 HbA1C 13% Chol 190, TG 217, HDL 46, LDL 98   Review of Systems  Constitutional: Negative for chills and fever.  Cardiovascular:  Positive for chest pain, dyspnea on exertion and leg swelling. Negative for  palpitations and syncope.        Vitals:   12/06/20 1544  BP: 137/79  Pulse: 84  Resp: 16  Temp: 98 F (36.7 C)  SpO2: 96%     Body mass index is 37.25 kg/m. Filed Weights   12/06/20 1544  Weight: 217 lb (98.4 kg)     Objective:   Physical Exam Vitals and nursing note reviewed.  Constitutional:      General: She is not in acute distress. Neck:     Vascular: No JVD.  Cardiovascular:     Rate and Rhythm: Normal rate and regular rhythm.     Heart sounds: Normal heart sounds. No murmur heard. Pulmonary:     Effort: Pulmonary effort is normal.     Breath sounds: Normal breath sounds. No wheezing or rales.  Musculoskeletal:     Right lower leg: Tenderness (Trace) present.     Left lower leg: No tenderness.          Assessment & Recommendations:    75 year old African American female withf Graves' disease with hyperthyroidism, paroxysmal atrial fibrillation, mild PH, HFpEF (secondary to Afib), uncontrolled type II diabetes mellitus with diabetic polyneuropathy, CKD stage III.  Dyspnea on exertion: New symptom, subacute in onset. Physical exam and echocardiogram unremarkable for acute congestive heart failure.  Nonetheless, I will obtain chest x-ray and BNP.  In addition, I recommend heart dyspnea could be angina equivalent.  I will obtain Lexiscan nuclear stress test.  I have empirically started her on Imdur 30 mg daily.  Further recommendations after above testing.  Follow-up after stress test.  Patient comes from [place], [place].  Therefore, I would like to perform stress test and office visit on same day.  Nigel Mormon, MD [facility]. PA Pager: [phone] Office: [phone] If no answer Cell [phone]

## 2020-12-06 NOTE — Telephone Encounter (Signed)
Rx refill request approved per Dr. Corey's orders. 

## 2020-12-06 NOTE — Telephone Encounter (Signed)
Pt is req a call back from MA as she is having some shortness of breath. Return call to [phone].

## 2020-12-08 ENCOUNTER — Ambulatory Visit: Payer: Medicare Other

## 2020-12-08 ENCOUNTER — Other Ambulatory Visit: Payer: Self-pay

## 2020-12-08 DIAGNOSIS — I10 Essential (primary) hypertension: Secondary | ICD-10-CM

## 2020-12-08 DIAGNOSIS — E1169 Type 2 diabetes mellitus with other specified complication: Secondary | ICD-10-CM

## 2020-12-08 DIAGNOSIS — E118 Type 2 diabetes mellitus with unspecified complications: Secondary | ICD-10-CM

## 2020-12-08 DIAGNOSIS — E785 Hyperlipidemia, unspecified: Secondary | ICD-10-CM

## 2020-12-08 NOTE — Patient Instructions (Signed)
Visit Information  Following are the goals we discussed today:   Manage My Medications   Timeframe:  Long-Range Goal Priority:  Medium Start Date:      06/16/20                       Expected End Date:    06/16/21                   Follow Up Date: April 2023   - call for medicine refill 2 or 3 days before it runs out - call if I am sick and can't take my medicine - keep a list of all the medicines I take; vitamins and herbals too - use a pillbox to sort medicine     Why is this important?   These steps will help you keep on track with your medicines.  Plan: Telephone follow up appointment with care management team member scheduled for:  4 months  The patient has been provided with contact information for the care management team and has been advised to call with any health related questions or concerns.   Ellin Saba, PharmD Clinical Pharmacist, [facility]   Please call the care guide team at [phone] if you need to cancel or reschedule your appointment.   The patient verbalized understanding of instructions, educational materials, and care plan provided today and declined offer to receive copy of patient instructions, educational materials, and care plan.

## 2020-12-08 NOTE — Progress Notes (Signed)
Chronic Care Management Pharmacy Note  12/08/2020 Name:  Donna Booker MRN:  [id number] DOB:  [birth year]  Summary: - Patient reports that since latest adjustment in glipizide dosing BG has been improved and is at goal - fasting BG averaging 95-118, post prandial BG averaging 147-177 - no issues with hypoglycemia  -Notes that BP well controlled averaging 120-130-60-70's HR 70-85 bpm -  -has started imdur - no changes noted - has only taken x 1 day -Reports that she still has some issues with SOB - notes to low energy levels - scheduled for follow up with cardiology next month  Recommendations/Changes made from today's visit: -Recommending no changes to medications - patient to continue to monitor blood sugars and blood pressures, to reach out with any concerns regarding medications  -continue to monitor kidney function - should CrCl decrease <70m/min - would recommend recution of gabapentin to 9014mdaily    Subjective: Donna Booker an [age].o. year old female who is a primary patient of CrHoyt KochMD.  The CCM team was consulted for assistance with disease management and care coordination needs.    Engaged with patient by telephone for follow up visit in response to provider referral for pharmacy case management and/or care coordination services.   Consent to Services:  The patient was given information about Chronic Care Management services, agreed to services, and gave verbal consent prior to initiation of services.  Please see initial visit note for detailed documentation.   Patient Care Team: CrHoyt KochMD as PCP - General (Internal Medicine) [facility]Charl[facility] L[facility] Pharmacist (Pharmacist)   [place] [facility] [place]  Recent office visits: None since lat visit   Recent consult visits: 12/06/2020 - Dr. PaVirgina Jock Cardiology - urgent  visit due to DOE x 3 weeks - ECHO ordered  - unremarkable - chest X ray and BNP ordered / lexiscan nuclear stress test ordered - started on imdur 3052maily  10/15/2020 - Dr. GheCruzita Lederer Endocrinology - glipizide 61m50mfore breakfast - start 5mg 53more dinner - f/u in 3 months   Hospital visits: None in previous 6 months   Objective:  Lab Results  Component Value Date   CREATININE 1.36 (H) 02/26/2020   BUN 18 02/26/2020   GFR 38.88 (L) 02/26/2020   GFRNONAA 42 (L) 02/26/2019   GFRAA 48 (L) 02/26/2019   NA 142 02/26/2020   K 3.8 02/26/2020   CALCIUM 9.3 02/26/2020   CO2 30 02/26/2020   GLUCOSE 48 (LL) 02/26/2020    Lab Results  Component Value Date/Time   HGBA1C 9.5 (A) 10/15/2020 10:59 AM   HGBA1C 6.1 (A) 06/28/2020 12:58 PM   HGBA1C 6.8 (H) 02/26/2020 11:10 AM   HGBA1C 8.8 (H) 02/26/2019 03:31 PM   HGBA1C 13.0 04/26/2018 12:00 AM   FRUCTOSAMINE 321 (H) 10/15/2020 10:14 AM   GFR 38.88 (L) 02/26/2020 11:10 AM   GFR 58.68 (L) 03/19/2018 08:44 AM   MICROALBUR 75.4 (H) 09/19/2019 03:52 PM   MICROALBUR 270.5 (H) 06/05/2019 10:12 AM    Last diabetic Eye exam:  Lab Results  Component Value Date/Time   HMDIABEYEEXA No Retinopathy 04/27/2015 12:00 AM    Last diabetic Foot exam: No results found for: HMDIABFOOTEX   Lab Results  Component Value Date   CHOL 106 02/26/2020   HDL 38.20 (L) 02/26/2020   LDLCAPueblitos  49 02/26/2020   LDLDIRECT 98.0 03/19/2018   TRIG 96.0 02/26/2020   CHOLHDL 3 02/26/2020    Hepatic Function Latest Ref Rng & Units 02/26/2020 02/26/2019 04/25/2018  Total Protein 6.0 - 8.3 g/dL 7.7 6.9 -  Albumin 3.5 - 5.2 g/dL 4.1 3.8 -  AST 0 - 37 U/L 13 13 12(A)  ALT 0 - 35 U/L 10 8 13   Alk Phosphatase 39 - 117 U/L 68 118(H) 124  Total Bilirubin 0.2 - 1.2 mg/dL 0.2 <0.2 -  Bilirubin, Direct 0.0 - 0.2 mg/dL - - -    Lab Results  Component Value Date/Time   TSH 2.90 10/15/2020 10:14 AM   TSH 1.37 07/14/2020 02:11 PM   FREET4 0.81 10/15/2020 10:14 AM   FREET4  1.30 07/14/2020 02:11 PM    CBC Latest Ref Rng & Units 02/26/2020 02/26/2019 04/25/2018  WBC 4.0 - 10.5 K/uL 13.7(H) 13.4(H) 11.2  Hemoglobin 12.0 - 15.0 g/dL 11.9(L) 12.1 12.0  Hematocrit 36.0 - 46.0 % 37.3 39.1 38  Platelets 150.0 - 400.0 K/uL 364.0 379 341   CHA2DS2-VASc Score = 5  The patient's score is based upon: CHF History: 1 HTN History: 1 Diabetes History: 1 Stroke History: 0 Vascular Disease History: 0 Age Score: 1 Gender Score: 1     No results found for: VD25OH  Clinical ASCVD: No  The ASCVD Risk score (Arnett DK, et al., 2019) failed to calculate for the following reasons:   The valid total cholesterol range is 130 to 320 mg/dL    Depression screen [facility] 2/9 03/07/2019 07/20/2017 07/16/2015  Decreased Interest 0 0 0  Down, Depressed, Hopeless 0 1 0  PHQ - 2 Score 0 1 0      Social History   Tobacco Use  Smoking Status Former   Packs/day: 1.00   Years: 10.00   Pack years: 10.00   Types: Cigarettes   Quit date: 2005   Years since quitting: 17.9  Smokeless Tobacco Never   BP Readings from Last 3 Encounters:  12/06/20 137/79  10/15/20 (!) 146/80  06/28/20 124/76   Pulse Readings from Last 3 Encounters:  12/06/20 84  10/15/20 85  06/28/20 75   Wt Readings from Last 3 Encounters:  12/06/20 217 lb (98.4 kg)  10/15/20 212 lb 3.2 oz (96.3 kg)  06/28/20 212 lb (96.2 kg)   BMI Readings from Last 3 Encounters:  12/06/20 37.25 kg/m  10/15/20 36.42 kg/m  06/28/20 36.39 kg/m    Assessment/Interventions: Review of patient past medical history, allergies, medications, health status, including review of consultants reports, laboratory and other test data, was performed as part of comprehensive evaluation and provision of chronic care management services.   SDOH:  (Social Determinants of Health) assessments and interventions performed: Yes   SDOH Screenings   Alcohol Screen: Not on file  Depression (PHQ2-9): Not on file  Financial Resource Strain:  Medium Risk   Difficulty of Paying Living Expenses: Somewhat hard  Food Insecurity: Not on file  Housing: Not on file  Physical Activity: Not on file  Social Connections: Not on file  Stress: Not on file  Tobacco Use: Medium Risk   Smoking Tobacco Use: Former   Smokeless Tobacco Use: Never   Passive Exposure: Not on file  Transportation Needs: Not on file    [place]  Allergies  Allergen Reactions   Tramadol Nausea And Vomiting    Medications Reviewed Today     Reviewed by Tomasa Blase, [facility] (Pharmacist) on 12/08/20 at  [place] List Status: <None>   Medication Order Taking? Sig Documenting Provider Last Dose Status Informant  Abatacept [facility]) [id number] Yes Inject into the skin every 30 (thirty) days. [provider] Taking Active   allopurinol (ZYLOPRIM) 300 MG tablet [id number] Yes TAKE 1 TABLET BY MOUTH EVERY DAY Gregor Hams, MD Taking Active   amLODipine (NORVASC) 10 MG tablet [id number] Yes TAKE 1 TABLET BY MOUTH EVERY DAY Patwardhan, Manish J, MD Taking Active   apixaban (ELIQUIS) 5 MG TABS tablet [id number] Yes Take 1 tablet (5 mg total) by mouth 2 (two) times daily. Nigel Mormon, MD Taking Active            Med Note Charlton Haws   Wed Jun 16, 2020 10:27 AM) Via BMS PAP  Dulaglutide (TRULICITY) 3 FY/9.2KM SOPN [id number] Yes Inject 3 mg into the skin once a week. Philemon Kingdom, MD Taking Active            Med Note Malena Catholic Jun 16, 2020 10:26 AM) Via [facility]  esomeprazole (NEXIUM) 40 MG capsule [id number] Yes Take 40 mg by mouth daily before breakfast. [provider] Taking Active Self  furosemide (LASIX) 20 MG tablet [id number] Yes TAKE 2 TABLETS BY MOUTH 2 TIMES DAILY. Patwardhan, Reynold Bowen, MD Taking Active   gabapentin (NEURONTIN) 300 MG capsule [id number] Yes 2 capsules BID and 1 capsule at bedtime (total 5 per day) Hoyt Koch, MD Taking Active   glipiZIDE (GLUCOTROL) 5 MG tablet [id number]  Yes TAKE 2 TABLETS (10 MG TOTAL) BY MOUTH IN AM AND 1 BEFORE SUPPER. Philemon Kingdom, MD Taking Active   hydrALAZINE (APRESOLINE) 25 MG tablet [id number] Yes TAKE 1 TABLET BY MOUTH THREE TIMES A DAY Patwardhan, Manish J, MD Taking Active   isosorbide mononitrate (IMDUR) 30 MG 24 hr tablet [id number] Yes Take 1 tablet (30 mg total) by mouth daily. Nigel Mormon, MD Taking Active   LEVEMIR FLEXTOUCH 100 UNIT/ML FlexTouch Pen [id number] Yes INJECT 50 UNITS INTO THE SKIN AT BEDTIME Philemon Kingdom, MD Taking Active   methimazole (TAPAZOLE) 5 MG tablet [id number] Yes TAKE 1/2 TABLET BY MOUTH DAILY Philemon Kingdom, MD Taking Active   metoprolol succinate (TOPROL-XL) 100 MG 24 hr tablet [id number] Yes TAKE 1 TABLET (100 MG TOTAL) BY MOUTH DAILY. TAKE WITH OR IMMEDIATELY FOLLOWING A MEAL. Patwardhan, Reynold Bowen, MD Taking Active   rosuvastatin (CRESTOR) 10 MG tablet [id number] Yes TAKE 1 TABLET BY MOUTH EVERY DAY Patwardhan, Manish J, MD Taking Active   VOLTAREN 1 % GEL [id number] Yes Apply 2 g topically 4 (four) times daily.  Patient taking differently: Apply 2 g topically as needed.   Hoyt Koch, MD Taking Active             Patient Active Problem List   Diagnosis Date Noted   Dyspnea on exertion 12/06/2020   Bruising 04/16/2020   Mixed hyperlipidemia 02/26/2019   Paroxysmal atrial fibrillation ([place]) 07/18/2018   H/O Graves' disease 07/18/2018   Yeast infection 03/19/2018   Abdominal pain 12/05/2017   Hyperglycemia due to type 2 diabetes mellitus ([place]) 07/14/2017   Fall at home, initial encounter 07/14/2017   Rheumatoid arthritis ([place]) 07/14/2017   Grief reaction 07/14/2017   Drug-induced hyperkalemia 07/14/2017   Myalgia 07/06/2017   Acute gout 05/15/2017   Hyperkalemia 05/11/2017   New onset of congestive heart failure ([place]) 04/21/2017   Chronic respiratory failure ([place]) 04/21/2017   Leukocytosis 04/21/2017   History  of atrial fibrillation    Cough 04/20/2017    Hyperthyroidism 04/18/2017   Atrial fibrillation with RVR ([place]) 04/15/2017   Routine general medical examination at a health care facility 10/20/2015   Diabetes mellitus type 2 with complications ([place]) 13/24/4010   Acute pain of left knee 01/28/2015   Hyperlipidemia associated with type 2 diabetes mellitus ([place]) 09/02/2014   Morbid obesity ([place]) 09/02/2014   Essential (primary) hypertension 10/04/2012    Immunization History  Administered Date(s) Administered   Fluad Quad(high Dose 65+) 09/22/2019   Influenza, High Dose Seasonal PF 10/20/2015, 11/02/2016, 10/26/2018   Influenza,inj,Quad PF,6+ Mos 10/27/2014   Influenza-Unspecified 10/16/2017   PFIZER(Purple Top)SARS-COV-2 Vaccination 02/07/2019, 02/28/2019, 10/15/2019, 05/21/2020   Pneumococcal Conjugate-13 12/30/2014   Pneumococcal Polysaccharide-23 10/29/2015   Zoster Recombinat (Shingrix) 10/26/2018, 05/21/2020    Conditions to be addressed/monitored:  Hypertension, Hyperlipidemia, Diabetes, Atrial Fibrillation, Heart Failure, and Gout  Care Plan : [place]  Updates made by Tomasa Blase, RPH since 12/08/2020 12:00 AM     Problem: Hypertension, Hyperlipidemia, Diabetes, Atrial Fibrillation, Heart Failure, and Gout   Priority: High     Long-Range Goal: Disease management   Start Date: 06/16/2020  Expected End Date: 06/16/2021  This Visit's Progress: On track  Recent Progress: On track  Priority: High  Note:   Current Barriers:  Unable to independently monitor therapeutic efficacy  Pharmacist Clinical Goal(s):  Patient will achieve adherence to monitoring guidelines and medication adherence to achieve therapeutic efficacy through collaboration with PharmD and provider.   Interventions: 1:1 collaboration with Hoyt Koch, MD regarding development and update of comprehensive plan of care as evidenced by provider attestation and co-signature Inter-disciplinary care team collaboration (see  longitudinal plan of care) Comprehensive medication review performed; medication list updated in electronic medical record  Hyperlipidemia: (LDL goal < 70) -Controlled - LDL is at goal; pt endorses compliance and denies side effects Lab Results  Component Value Date   LDLCALC 49 02/26/2020  -Current treatment: Rosuvastatin 10 mg daily -Current exercise habits: walks 3 days a week with cousins -Educated on Cholesterol goals; Benefits of statin for ASCVD risk reduction; -Recommended to continue current medication  Diabetes (A1c goal <7%) -Improved -Current home glucose readings fasting glucose: 95-118 post prandial glucose: 147-177 -DX 1987, insulin-dependent since 2006 -Current medications: Trulicity 3 mg weekly Glipizide 5 mg - 2 tab AM, 1 tab PM Levemir 50 units HS  -Medications previously tried: metformin (AKI), Victoza, Bydureon -Recommended to continue current medications   Atrial Fibrillation (Goal: prevent stroke and major bleeding) -Controlled - pt endorses compliance and denies issues; she endorses some bruising but has been reassured by cardiologist that this is not harmful -CHADSVASC: 5 -Current treatment: Rate control: Metoprolol 50 mg daily Anticoagulation: Eliquis 5 mg BID -Counseled on avoidance of NSAIDs due to increased bleeding risk with anticoagulants; -Recommended to continue current medication  Heart Failure / Hypertension (Goal: BP goal < 140/90,prevent exacerbations) -Improvide - pt is checking BP daily (reports her monitor sends readings to cardiologist) and it is above goal; she reports swelling is under control; she has hx of hyperkalemia requiring hospitalization (07/2017) while on lisinopril, potassium supplement -Current home BP/HR readings: 120-130/60-70's HR 70-85 bpm -Last ejection fraction: 62% (12/06/2020) -HF type: Diastolic -Current treatment: Amlodipine 10 mg daily Furosemide 20 mg -2 tab BID Hydralazine 25 mg TID Metoprolol succinate 100  mg daily Isosorbide mononitrate 30m daily  -Medications previously tried: atenolol, diltiazem, isosorbide, lisinopril (hyperK), olmesartan, HCTZ, spironolactone -Educated on Benefits of medications for  managing symptoms and prolonging life; Importance of blood pressure control -Counseled on low salt diet -Advised pt to contact her cardiologist should she have issues with BP   Graves disease / hyperthyroidism (Goal: maintain TSH/T4 in goal range) - Controlled- recent TFTs were normal; pt endorses compliance with methimazole 1/2 tab Lab Results  Component Value Date   TSH 2.90 10/15/2020  -Current treatment  Methimazole 5 mg - 1/2 tab daily -Recommend to continue current medication  Gout (Goal: prevent flares) -Controlled - pt reports she has not had a flare since April 2021 when allopurinol was initially prescribed; she is not taking colchicine regularly -Current treatment  Allopurinol 300 mg daily Colchicine 0.6 mg daily PRN -Counseled on indication for allopurinol for gout prevention and colchicine for gout treatment -Recommend to continue current medication  Health Maintenance -Vaccine gaps: TDAP, Flu -Advised to get flu vaccine  Patient Goals/Self-Care Activities Patient will:  - take medications as prescribed -focus on medication adherence by pill box -check glucose daily -check blood pressure daily       Medication Assistance:  Trulicity - [place] (through 01/01/21) - via Endocrine Eliquis - BMS PAP (through 01/01/21) - via Cardiology  Compliance/Adherence/Medication fill history: Care Gaps: Colonoscopy (never done) Eye exam (due 04/26/16) - pt reports she gets one every year w/ Dr Catalina Antigua @ [place] [phone])  Star-Rating Drugs: Rosuvastatin - last fill 09/12/20 90D Glipizide - LF 08/13/20 x 90 ds  Patient's preferred pharmacy is:  [id number] [address]Phone: [phone]: [phone] Uses pill box? Yes Pt endorses 100% compliance  We discussed: Current pharmacy is preferred with insurance plan and patient is satisfied with pharmacy services Patient decided to: Continue current medication management strategy  Care Plan and Follow Up Patient Decision:  Patient agrees to Care Plan and Follow-up.  Plan: Telephone follow up appointment with care management team member scheduled for:  4 months  DTomasa Blase PharmD Clinical Pharmacist, [place]

## 2020-12-29 ENCOUNTER — Other Ambulatory Visit: Payer: Medicare Other

## 2021-01-10 ENCOUNTER — Other Ambulatory Visit: Payer: Medicare Other

## 2021-01-12 ENCOUNTER — Other Ambulatory Visit: Payer: Self-pay

## 2021-01-12 ENCOUNTER — Ambulatory Visit: Payer: Medicare Other | Admitting: Cardiology

## 2021-01-12 ENCOUNTER — Ambulatory Visit
Admission: RE | Admit: 2021-01-12 | Discharge: 2021-01-12 | Disposition: A | Discharge status: Home / Self Care | Payer: Medicare Other | Source: Ambulatory Visit | Attending: Cardiology | Admitting: Cardiology

## 2021-01-12 ENCOUNTER — Other Ambulatory Visit: Payer: Medicare Other

## 2021-01-12 DIAGNOSIS — R0609 Other forms of dyspnea: Secondary | ICD-10-CM

## 2021-01-17 ENCOUNTER — Ambulatory Visit: Payer: Medicare Other

## 2021-01-17 ENCOUNTER — Other Ambulatory Visit: Payer: Self-pay

## 2021-01-17 DIAGNOSIS — R0609 Other forms of dyspnea: Secondary | ICD-10-CM

## 2021-01-18 ENCOUNTER — Ambulatory Visit: Payer: Medicare Other | Admitting: Internal Medicine

## 2021-01-21 NOTE — Progress Notes (Signed)
Called pt to inform her about her stress test results. Pt understood

## 2021-02-03 ENCOUNTER — Encounter: Payer: Self-pay | Admitting: Cardiology

## 2021-02-03 ENCOUNTER — Ambulatory Visit: Payer: Medicare Other | Admitting: Cardiology

## 2021-02-03 ENCOUNTER — Other Ambulatory Visit: Payer: Self-pay

## 2021-02-03 ENCOUNTER — Other Ambulatory Visit: Payer: Self-pay | Admitting: Internal Medicine

## 2021-02-03 VITALS — BP 140/80 | HR 88 | Temp 98.0°F | Resp 16 | Ht 64.0 in | Wt 211.0 lb

## 2021-02-03 DIAGNOSIS — I10 Essential (primary) hypertension: Secondary | ICD-10-CM

## 2021-02-03 DIAGNOSIS — I480 Paroxysmal atrial fibrillation: Secondary | ICD-10-CM

## 2021-02-03 DIAGNOSIS — R0609 Other forms of dyspnea: Secondary | ICD-10-CM

## 2021-02-03 NOTE — Progress Notes (Signed)
Follow up visit  Subjective:   Donna Booker, female    DOB: 10/06/1945, 76 y.o.   MRN: [id number]   Chief Complaint  Patient presents with   Atrial Fibrillation   Dyspnea on exertion   Follow-up    1 year     HPI  76 year old African American female withf Graves' disease with hyperthyroidism, paroxysmal atrial fibrillation, mild PH, HFpEF (secondary to Afib), uncontrolled type II diabetes mellitus with diabetic polyneuropathy, CKD stage III.  Patient has continued to have exertional dyspnea with minimal activity. This has also correlated with weight gain. Echocardiogram and stress test were unremarkable, details below.   Patient is actively working on weight loss, has lost 6 lbs, hopes to lose a total of 50 lbs. She has reduced her intake of ice cream, cakes etc  Current Outpatient Medications on File Prior to Visit  Medication Sig Dispense Refill   Abatacept (ORENCIA [place]) Inject into the skin every 30 (thirty) days.     allopurinol (ZYLOPRIM) 300 MG tablet TAKE 1 TABLET BY MOUTH EVERY DAY 90 tablet 1   amLODipine (NORVASC) 10 MG tablet TAKE 1 TABLET BY MOUTH EVERY DAY 90 tablet 1   apixaban (ELIQUIS) 5 MG TABS tablet Take 1 tablet (5 mg total) by mouth 2 (two) times daily. 180 tablet 3   Dulaglutide (TRULICITY) 3 BC/4.8GQ SOPN Inject 3 mg into the skin once a week. 2 mL 3   esomeprazole (NEXIUM) 40 MG capsule Take 40 mg by mouth daily before breakfast.     furosemide (LASIX) 20 MG tablet TAKE 2 TABLETS BY MOUTH 2 TIMES DAILY. 360 tablet 1   gabapentin (NEURONTIN) 300 MG capsule 2 capsules BID and 1 capsule at bedtime (total 5 per day) 150 capsule 5   glipiZIDE (GLUCOTROL) 5 MG tablet TAKE 2 TABLETS (10 MG TOTAL) BY MOUTH IN AM AND 1 BEFORE SUPPER. 270 tablet 3   hydrALAZINE (APRESOLINE) 25 MG tablet TAKE 1 TABLET BY MOUTH THREE TIMES A DAY 270 tablet 0   isosorbide mononitrate (IMDUR) 30 MG 24 hr tablet Take 1 tablet (30 mg total) by mouth daily. 30 tablet 3   LEVEMIR  FLEXTOUCH 100 UNIT/ML FlexTouch Pen INJECT 50 UNITS INTO THE SKIN AT BEDTIME 45 mL 1   methimazole (TAPAZOLE) 5 MG tablet TAKE 1/2 TABLET BY MOUTH DAILY 45 tablet 0   metoprolol succinate (TOPROL-XL) 100 MG 24 hr tablet TAKE 1 TABLET (100 MG TOTAL) BY MOUTH DAILY. TAKE WITH OR IMMEDIATELY FOLLOWING A MEAL. 90 tablet 2   rosuvastatin (CRESTOR) 10 MG tablet TAKE 1 TABLET BY MOUTH EVERY DAY 90 tablet 1   VOLTAREN 1 % GEL Apply 2 g topically 4 (four) times daily. (Patient taking differently: Apply 2 g topically as needed.) 100 g 3   No current facility-administered medications on file prior to visit.    Cardiovascular studies:  Lexiscan Nuclear stress test 01/18/2020: Nondiagnostic ECG stress. The heart rate response was consistent with Lexiscan.  Myocardial perfusion is normal. Overall LV systolic function is normal without regional wall motion abnormalities. Stress LV EF: 64%.  No previous exam available for comparison. Low risk.   EKG 12/06/2020: Sinus rhythm 81 bpm Borderline left atrial enlargement  Echocardiogram 12/06/2020: Left ventricle cavity is normal in size. Moderate concentric hypertrophy of the left ventricle. Normal global wall motion. Normal LV systolic function with EF 62%. Doppler evidence of grade I (impaired) diastolic dysfunction, normal LAP.  Trace MR, trace TR. No evidence of pulmonary hypertension. Compared to previous  study in 2019, atrial dilatation, mild PH now absent.  Renal artery duplex ([facility]) 05/23/2017: No renal artery stenosis  Recent labs: 10/2020: TSH 2.9 normal  02/26/2020: Glucose 48, BUN/Cr 18/1.36. EGFR 38. Na/K 142/3.8.  Rest of the CMP normal H/H 11/37. MCV 72. Platelets 364 HbA1C 9.5% Chol 106, TG 96, HDL 38, LDL 49  11/25/2019: HbA1C 8.1%  09/2019: TSH 3.1  06/05/2019: HbA1C 7.3% Chol 149, TG 119, HDL 45, LDL 80 TSH 0.46 normal  04/25/2018: Glucose 269, BUN/Cr 18/1.1. EGFR 58. Na/K 142/4.1. Rest of the CMP normal H/H  12/37. MCV 75. Platelets 306 HbA1C 13% Chol 190, TG 217, HDL 46, LDL 98   Review of Systems  Constitutional: Negative for chills and fever.  Cardiovascular:  Negative for chest pain, dyspnea on exertion, leg swelling, palpitations and syncope.        Vitals:   02/03/21 1116  BP: 140/80  Pulse: 88  Resp: 16  Temp: 98 F (36.7 C)  SpO2: 97%     Body mass index is 36.22 kg/m. Filed Weights   02/03/21 1116  Weight: 211 lb (95.7 kg)     Objective:   Physical Exam Vitals and nursing note reviewed.  Constitutional:      General: She is not in acute distress. Neck:     Vascular: No JVD.  Cardiovascular:     Rate and Rhythm: Normal rate and regular rhythm.     Heart sounds: Normal heart sounds. No murmur heard. Pulmonary:     Effort: Pulmonary effort is normal.     Breath sounds: Normal breath sounds. No wheezing or rales.  Musculoskeletal:     Right lower leg: Tenderness (Trace) present. No edema.     Left lower leg: No tenderness. No edema.          Assessment & Recommendations:    76 year old African American female withf Graves' disease with hyperthyroidism, paroxysmal atrial fibrillation, mild PH, HFpEF (secondary to Afib), uncontrolled type II diabetes mellitus with diabetic polyneuropathy, CKD stage III.  Dyspnea on exertion: Likely due to deconditioning and weight gain. No significant abnormalities noted on stress test and echocardiogram. F/u in 6 months to review progress with weight loss.   PAF: Maintaining sinus rhythm. CHA2DS2VASc score 4, annual stroke risk 5%. Continue anticaogualtion with eliquis 5 mg bid.   Hypertension: Well controlled.  She should not be on ACEi/ARB/aldosterone antagonist due to pevious episodes of hyperkalemia.   Grave's disease, DM:  F/u w/endocrinology   F/u in 6 months  Ryane Konieczny Esther Hardy, MD [facility] Pager: [phone] Office: [phone] If no answer Cell [phone]

## 2021-02-04 ENCOUNTER — Other Ambulatory Visit: Payer: Self-pay | Admitting: Internal Medicine

## 2021-02-04 MED ORDER — LANTUS SOLOSTAR 100 UNIT/ML ~~LOC~~ SOPN: 50.0000 [IU] | PEN_INJECTOR | Freq: Every day | SUBCUTANEOUS | 1 refills | Status: DC

## 2021-02-13 ENCOUNTER — Other Ambulatory Visit: Payer: Self-pay | Admitting: Internal Medicine

## 2021-02-27 ENCOUNTER — Other Ambulatory Visit: Payer: Self-pay | Admitting: Cardiology

## 2021-02-27 DIAGNOSIS — R0609 Other forms of dyspnea: Secondary | ICD-10-CM

## 2021-02-28 ENCOUNTER — Encounter: Payer: Self-pay | Admitting: Internal Medicine

## 2021-02-28 ENCOUNTER — Ambulatory Visit (INDEPENDENT_AMBULATORY_CARE_PROVIDER_SITE_OTHER): Payer: Medicare Other | Admitting: Internal Medicine

## 2021-02-28 ENCOUNTER — Other Ambulatory Visit: Payer: Self-pay | Admitting: Pharmacist

## 2021-02-28 ENCOUNTER — Other Ambulatory Visit: Payer: Self-pay

## 2021-02-28 VITALS — BP 130/76 | HR 75 | Temp 98.0°F | Ht 64.0 in | Wt 217.0 lb

## 2021-02-28 DIAGNOSIS — E1169 Type 2 diabetes mellitus with other specified complication: Secondary | ICD-10-CM

## 2021-02-28 DIAGNOSIS — I10 Essential (primary) hypertension: Secondary | ICD-10-CM

## 2021-02-28 DIAGNOSIS — M059 Rheumatoid arthritis with rheumatoid factor, unspecified: Secondary | ICD-10-CM

## 2021-02-28 DIAGNOSIS — E785 Hyperlipidemia, unspecified: Secondary | ICD-10-CM

## 2021-02-28 DIAGNOSIS — I480 Paroxysmal atrial fibrillation: Secondary | ICD-10-CM

## 2021-02-28 DIAGNOSIS — E118 Type 2 diabetes mellitus with unspecified complications: Secondary | ICD-10-CM | POA: Diagnosis not present

## 2021-02-28 DIAGNOSIS — Z0000 Encounter for general adult medical examination without abnormal findings: Secondary | ICD-10-CM | POA: Diagnosis not present

## 2021-02-28 DIAGNOSIS — E0590 Thyrotoxicosis, unspecified without thyrotoxic crisis or storm: Secondary | ICD-10-CM

## 2021-02-28 LAB — LIPID PANEL
Cholesterol: 138 mg/dL (ref 0–200)
HDL: 45 mg/dL (ref >39.00)
LDL Cholesterol: 73 mg/dL (ref 0–99)
NonHDL: 92.55
Total CHOL/HDL Ratio: 3
Triglycerides: 100 mg/dL (ref 0.0–149.0)
VLDL: 20 mg/dL (ref 0.0–40.0)

## 2021-02-28 LAB — COMPREHENSIVE METABOLIC PANEL
ALT: 8 U/L (ref 0–35)
AST: 19 U/L (ref 0–37)
Albumin: 4.2 g/dL (ref 3.5–5.2)
Alkaline Phosphatase: 73 U/L (ref 39–117)
BUN: 27 mg/dL — ABNORMAL HIGH (ref 6–23)
CO2: 27 mEq/L (ref 19–32)
Calcium: 9.8 mg/dL (ref 8.4–10.5)
Chloride: 100 mEq/L (ref 96–112)
Creatinine, Ser: 1.44 mg/dL — ABNORMAL HIGH (ref 0.40–1.20)
GFR: 36.05 mL/min — ABNORMAL LOW (ref >60.00)
Glucose, Bld: 58 mg/dL — ABNORMAL LOW (ref 70–99)
Potassium: 4.2 mEq/L (ref 3.5–5.1)
Sodium: 138 mEq/L (ref 135–145)
Total Bilirubin: 0.2 mg/dL (ref 0.2–1.2)
Total Protein: 8.1 g/dL (ref 6.0–8.3)

## 2021-02-28 LAB — CBC
HCT: 34.4 % — ABNORMAL LOW (ref 36.0–46.0)
Hemoglobin: 10.6 g/dL — ABNORMAL LOW (ref 12.0–15.0)
MCHC: 30.9 g/dL (ref 30.0–36.0)
MCV: 71.2 fl — ABNORMAL LOW (ref 78.0–100.0)
Platelets: 356 10*3/uL (ref 150.0–400.0)
RBC: 4.84 Mil/uL (ref 3.87–5.11)
RDW: 18.4 % — ABNORMAL HIGH (ref 11.5–15.5)
WBC: 13.3 10*3/uL — ABNORMAL HIGH (ref 4.0–10.5)

## 2021-02-28 LAB — MICROALBUMIN / CREATININE URINE RATIO
Creatinine,U: 79.6 mg/dL
Microalb Creat Ratio: 35.4 mg/g — ABNORMAL HIGH (ref 0.0–30.0)
Microalb, Ur: 28.2 mg/dL — ABNORMAL HIGH (ref 0.0–1.9)

## 2021-02-28 MED ORDER — APIXABAN 5 MG PO TABS: 5.0000 mg | ORAL_TABLET | Freq: Two times a day (BID) | ORAL | 3 refills | Status: DC

## 2021-02-28 NOTE — Progress Notes (Signed)
Subjective:   Patient ID: Donna Booker, female    DOB: February 25, 1945, 76 y.o.   MRN: [id number]  HPI Here for medicare wellness, and follow up. Please see A/P for status and treatment of chronic medical problems.   Diet: DM since diabetic Physical activity: sedentary Depression/mood screen: negative Hearing: intact to whispered voice, mild loss bilateral Visual acuity: grossly normal with lens, performs annual eye exam  ADLs: capable Fall risk: none Home safety: good Cognitive evaluation: intact to orientation, naming, recall and repetition EOL planning: adv directives discussed, in place  [employer] Visit from 02/28/2021 in [place] [facility] Total Score 0        Fall Risk 07/20/2017 11/01/2017 11/28/2017 03/07/2019 02/28/2021  Falls in the past year? Yes - - 0 0  Was there an injury with Fall? No - - - 0  Fall Risk Category Calculator - - - - 0  Fall Risk Category - - - - Low  Patient Fall Risk Level - Low fall risk Low fall risk - Low fall risk    I have personally reviewed and have noted 1. The patient's medical and social history - reviewed today no changes 2. Their use of alcohol, tobacco or illicit drugs 3. Their current medications and supplements 4. The patient's functional ability including ADL's, fall risks, home safety risks and hearing or visual impairment. 5. Diet and physical activities 6. Evidence for depression or mood disorders 7. Care team reviewed and updated 8.  The patient is not on an opioid pain medication.  Patient Care Team: Hoyt Koch, MD as PCP - General (Internal Medicine) Charlton Haws, [facility] as Pharmacist (Pharmacist) Past Medical History:  Diagnosis Date   Afib [facility])    Diabetes mellitus    Graves disease    Heart failure ([place])    Hypertension    Hyperthyroidism    Past Surgical History:  Procedure Laterality Date   BREAST SURGERY     Family History  Problem Relation Age of Onset    Diabetes Mellitus II Mother    Diabetes Mellitus II Father    Hypertension Father    Hyperlipidemia Father    Heart disease Sister    Stroke Sister    Heart disease Brother    Diabetes Brother    Heart failure Brother    Review of Systems  Constitutional: Negative.   HENT: Negative.    Eyes: Negative.   Respiratory:  Negative for cough, chest tightness and shortness of breath.   Cardiovascular:  Negative for chest pain, palpitations and leg swelling.  Gastrointestinal:  Negative for abdominal distention, abdominal pain, constipation, diarrhea, nausea and vomiting.  Musculoskeletal: Negative.   Skin: Negative.   Neurological: Negative.   Psychiatric/Behavioral: Negative.     Objective:  Physical Exam Constitutional:      Appearance: She is well-developed.  HENT:     Head: Normocephalic and atraumatic.  Cardiovascular:     Rate and Rhythm: Normal rate and regular rhythm.  Pulmonary:     Effort: Pulmonary effort is normal. No respiratory distress.     Breath sounds: Normal breath sounds. No wheezing or rales.  Abdominal:     General: Bowel sounds are normal. There is no distension.     Palpations: Abdomen is soft.     Tenderness: There is no abdominal tenderness. There is no rebound.  Musculoskeletal:     Cervical back: Normal range of motion.  Skin:    General: Skin is  warm and dry.     Comments: Foot exam done  Neurological:     Mental Status: She is alert and oriented to person, place, and time.     Coordination: Coordination normal.    Vitals:   02/28/21 1040  BP: 130/76  Pulse: 75  Temp: 98 F (36.7 C)  TempSrc: Oral  SpO2: 98%  Weight: 217 lb (98.4 kg)  Height: 5\' 4"  (1.626 m)   This visit occurred during the SARS-CoV-2 public health emergency.  Safety protocols were in place, including screening questions prior to the visit, additional usage of staff PPE, and extensive cleaning of exam room while observing appropriate contact time as indicated for  disinfecting solutions.   Assessment & Plan:

## 2021-02-28 NOTE — Patient Instructions (Signed)
We will check the labs today. 

## 2021-03-04 NOTE — Assessment & Plan Note (Signed)
Working on weight loss. BMI 37 and complicated by diabetes, hyperlipidemia and others. She wants to try golo and I have asked her to discuss with her endocrinologist as there are some medications she is taking the tend to hinder weight loss.  ?

## 2021-03-04 NOTE — Assessment & Plan Note (Signed)
Flu shot up to date. Covid-19 up to date. Pneumonia complete. Shingrix complete. Tetanus due declines. Colonoscopy due declines. Mammogram due 2024, pap smear aged out and dexa complete. Counseled about sun safety and mole surveillance. Counseled about the dangers of distracted driving. Given 10 year screening recommendations.  ? ?

## 2021-03-04 NOTE — Assessment & Plan Note (Signed)
Checking lipid panel and adjust crestor 10 mg daily as needed for LDL goal <100.  

## 2021-03-04 NOTE — Assessment & Plan Note (Signed)
Checking CMP and adjust amlodipine 10 mg daily and lasix 40 mg BID and hydralazine 25 mg TID and imdur 30 mg daily and metoprolol 100 mg daily. Continue regimen for now. ?

## 2021-03-04 NOTE — Assessment & Plan Note (Signed)
Taking orencia and seeing rheumatology and this is okay control currently. No major flare but this does limit her life due to pain.  ?

## 2021-03-04 NOTE — Assessment & Plan Note (Signed)
She is under better control now and is working on weight loss. She is taking trulicity and glipizide and lantus/levemir (she is waiting to find out which is covered by insurance). I have asked her to work with her endocrinologist to adjust her regimen to help her with weight loss. Potentially changing glipizide to jardiance/farxiga could help. Foot exam done and checking lipid panel, CMP and microalbumin to creatinine ratio.  ?

## 2021-03-04 NOTE — Assessment & Plan Note (Signed)
Seeing endo and taking methimazole 2.5 mg daily. They monitor levels and they are recent so none taken today.  ?

## 2021-03-16 ENCOUNTER — Other Ambulatory Visit: Payer: Self-pay | Admitting: Internal Medicine

## 2021-03-20 ENCOUNTER — Other Ambulatory Visit: Payer: Self-pay | Admitting: Internal Medicine

## 2021-03-20 ENCOUNTER — Other Ambulatory Visit: Payer: Self-pay | Admitting: Family Medicine

## 2021-03-20 ENCOUNTER — Other Ambulatory Visit: Payer: Self-pay | Admitting: Cardiology

## 2021-03-20 DIAGNOSIS — E0500 Thyrotoxicosis with diffuse goiter without thyrotoxic crisis or storm: Secondary | ICD-10-CM

## 2021-03-20 DIAGNOSIS — I10 Essential (primary) hypertension: Secondary | ICD-10-CM

## 2021-03-20 DIAGNOSIS — E782 Mixed hyperlipidemia: Secondary | ICD-10-CM

## 2021-03-20 DIAGNOSIS — R600 Localized edema: Secondary | ICD-10-CM

## 2021-04-06 ENCOUNTER — Ambulatory Visit (INDEPENDENT_AMBULATORY_CARE_PROVIDER_SITE_OTHER): Payer: Medicare Other

## 2021-04-06 DIAGNOSIS — I10 Essential (primary) hypertension: Secondary | ICD-10-CM

## 2021-04-06 DIAGNOSIS — E118 Type 2 diabetes mellitus with unspecified complications: Secondary | ICD-10-CM

## 2021-04-06 DIAGNOSIS — E1169 Type 2 diabetes mellitus with other specified complication: Secondary | ICD-10-CM

## 2021-04-06 NOTE — Progress Notes (Signed)
? ?Chronic Care Management ?Pharmacy Note ? ?04/06/2021 ?Name:  Donna Booker MRN:  [id number] DOB:  12/30/1945 ? ?Summary: ?-Patient reports that she has been doing well, denies any issues / concerns with current medications  ?-Notes that BG typically averaging 80-110 in the AM and 2 hours after meals ~130-140 ?-Has been monitoring BP daily and reporting results to cardiologist - could not recall recent reading but has been controlled  ? ?Recommendations/Changes made from today's visit: ?-Recommending for patient to reduce gabapentin to 300mg TID based on latest labs (CrCl: 39.5mL/min) advised for patient to reach out should she need any help with [facility] app - patient plans to call endo office as they did it last year  ? ? ? ?Subjective: ?Donna Booker is an 75 y.o. year old female who is a primary patient of Donna Booker.  The CCM team was consulted for assistance with disease management and care coordination needs.   ? ?Engaged with patient by telephone for follow up visit in response to provider referral for pharmacy case management and/or care coordination services.  ? ?Consent to Services:  ?The patient was given information about Chronic Care Management services, agreed to services, and gave verbal consent prior to initiation of services.  Please see initial visit note for detailed documentation.  ? ?Patient Care Team: ?Donna Booker as PCP - General (Internal Medicine) ?Szabat, Daniel C, RPH (Pharmacist) ? ?Patient lives at home with her husband in [place]. She would rather travel to [place] for health care than get care in [place].  ? ?Recent office visits: ?02/28/2021 - Dr. Crawford - no changes to medications  ? ?Recent consult visits: ?02/03/2021 - Dr. Patwardhan - Cardiology - no changes to medications - f/u in 6 months  ? ?Hospital visits: ?None in previous 6 months ? ? ?Objective: ? ?Lab Results  ?Component Value Date  ? CREATININE 1.44 (H) 02/28/2021  ? BUN 27 (H)  02/28/2021  ? GFR 36.05 (L) 02/28/2021  ? GFRNONAA 42 (L) 02/26/2019  ? GFRAA 48 (L) 02/26/2019  ? NA 138 02/28/2021  ? K 4.2 02/28/2021  ? CALCIUM 9.8 02/28/2021  ? CO2 27 02/28/2021  ? GLUCOSE 58 (L) 02/28/2021  ? ? ?Lab Results  ?Component Value Date/Time  ? HGBA1C 9.5 (A) 10/15/2020 10:59 AM  ? HGBA1C 6.1 (A) 06/28/2020 12:58 PM  ? HGBA1C 6.8 (H) 02/26/2020 11:10 AM  ? HGBA1C 8.8 (H) 02/26/2019 03:31 PM  ? HGBA1C 13.0 04/26/2018 12:00 AM  ? FRUCTOSAMINE 321 (H) 10/15/2020 10:14 AM  ? GFR 36.05 (L) 02/28/2021 11:12 AM  ? GFR 38.88 (L) 02/26/2020 11:10 AM  ? MICROALBUR 28.2 (H) 02/28/2021 11:12 AM  ? MICROALBUR 75.4 (H) 09/19/2019 03:52 PM  ?  ?Last diabetic Eye exam:  ?Lab Results  ?Component Value Date/Time  ? HMDIABEYEEXA No Retinopathy 04/27/2015 12:00 AM  ?  ?Last diabetic Foot exam: No results found for: HMDIABFOOTEX  ? ?Lab Results  ?Component Value Date  ? CHOL 138 02/28/2021  ? HDL 45.00 02/28/2021  ? LDLCALC 73 02/28/2021  ? LDLDIRECT 98.0 03/19/2018  ? TRIG 100.0 02/28/2021  ? CHOLHDL 3 02/28/2021  ? ? ? ?  Latest Ref Rng & Units 02/28/2021  ? 11:12 AM 02/26/2020  ? 11:10 AM 02/26/2019  ?  3:32 PM  ?Hepatic Function  ?Total Protein 6.0 - 8.3 g/dL 8.1   7.7   6.9    ?Albumin 3.5 - 5.2 g/dL 4.2   4.1   3.8    ?  AST 0 - 37 U/L 19   13   13    ?ALT 0 - 35 U/L 8   10   8    ?Alk Phosphatase 39 - 117 U/L 73   68   118    ?Total Bilirubin 0.2 - 1.2 mg/dL 0.2   0.2   <0.2    ? ? ?Lab Results  ?Component Value Date/Time  ? TSH 2.90 10/15/2020 10:14 AM  ? TSH 1.37 07/14/2020 02:11 PM  ? FREET4 0.81 10/15/2020 10:14 AM  ? FREET4 1.30 07/14/2020 02:11 PM  ? ? ? ?  Latest Ref Rng & Units 02/28/2021  ? 11:12 AM 02/26/2020  ? 11:10 AM 02/26/2019  ?  3:32 PM  ?CBC  ?WBC 4.0 - 10.5 K/uL 13.3   13.7   13.4    ?Hemoglobin 12.0 - 15.0 g/dL 10.6   11.9   12.1    ?Hematocrit 36.0 - 46.0 % 34.4   37.3   39.1    ?Platelets 150.0 - 400.0 K/uL 356.0   364.0   379    ? ?CHA2DS2-VASc Score = 5  ?The patient's score is based upon: ?CHF  History: 1 ?HTN History: 1 ?Diabetes History: 1 ?Stroke History: 0 ?Vascular Disease History: 0 ?Age Score: 1 ?Gender Score: 1 ?   ? ? ?No results found for: VD25OH ? ?Clinical ASCVD: No  ?The 10-year ASCVD risk score (Arnett DK, et al., 2019) is: 21.8% ?  Values used to calculate the score: ?    Age: 76 years ?    Sex: Female ?    Is Non-Hispanic African American: Yes ?    Diabetic: Yes ?    Tobacco smoker: No ?    Systolic Blood Pressure: 130 mmHg ?    Is BP treated: Yes ?    HDL Cholesterol: 45 mg/dL ?    Total Cholesterol: 138 mg/dL   ? ? ?  02/28/2021  ? 10:38 AM 03/07/2019  ? 11:00 AM 07/20/2017  ?  2:56 PM  ?Depression screen PHQ 2/9  ?Decreased Interest 0 0 0  ?Down, Depressed, Hopeless 0 0 1  ?PHQ - 2 Score 0 0 1  ?  ? ? ?Social History  ? ?Tobacco Use  ?Smoking Status Former  ? Packs/day: 1.00  ? Years: 10.00  ? Pack years: 10.00  ? Types: Cigarettes  ? Quit date: 2005  ? Years since quitting: 18.2  ?Smokeless Tobacco Never  ? ?BP Readings from Last 3 Encounters:  ?02/28/21 130/76  ?02/03/21 140/80  ?12/06/20 137/79  ? ?Pulse Readings from Last 3 Encounters:  ?02/28/21 75  ?02/03/21 88  ?12/06/20 84  ? ?Wt Readings from Last 3 Encounters:  ?02/28/21 217 lb (98.4 kg)  ?02/03/21 211 lb (95.7 kg)  ?12/06/20 217 lb (98.4 kg)  ? ?BMI Readings from Last 3 Encounters:  ?02/28/21 37.25 kg/m?  ?02/03/21 36.22 kg/m?  ?12/06/20 37.25 kg/m?  ? ? ?Assessment/Interventions: Review of patient past medical history, allergies, medications, health status, including review of consultants reports, laboratory and other test data, was performed as part of comprehensive evaluation and provision of chronic care management services.  ? ?SDOH:  (Social Determinants of Health) assessments and interventions performed: Yes ? ? ?SDOH Screenings  ? ?Alcohol Screen: Not on file  ?Depression (PHQ2-9): Low Risk   ? PHQ-2 Score: 0  ?Financial Resource Strain: Medium Risk  ? Difficulty of Paying Living Expenses: Somewhat hard  ?Food Insecurity:  Not on file  ?Housing: Not on   file  ?Physical Activity: Not on file  ?Social Connections: Not on file  ?Stress: Not on file  ?Tobacco Use: Medium Risk  ? Smoking Tobacco Use: Former  ? Smokeless Tobacco Use: Never  ? Passive Exposure: Not on file  ?Transportation Needs: Not on file  ? ? ?[place] ? ?Allergies  ?Allergen Reactions  ? Tramadol Nausea And Vomiting  ? ? ?Medications Reviewed Today   ? ? Reviewed by Tomasa Blase, [facility] (Pharmacist) on 04/06/21 at 1305  Med List Status: <None>  ? ?Medication Order Taking? Sig Documenting Provider Last Dose Status Informant  ?Abatacept [facility]) [id number] Yes Inject into the skin every 30 (thirty) days. Provider, Historical, Booker Taking Active   ?allopurinol (ZYLOPRIM) 300 MG tablet [id number] Yes TAKE 1 TABLET BY MOUTH EVERY DAY Gregor Hams, Booker Taking Active   ?amLODipine (NORVASC) 10 MG tablet [id number] Yes TAKE 1 TABLET BY MOUTH EVERY DAY Patwardhan, Reynold Bowen, Booker Taking Active   ?apixaban (ELIQUIS) 5 MG TABS tablet [id number] Yes Take 1 tablet (5 mg total) by mouth 2 (two) times daily. Nigel Mormon, Booker Taking Active   ?Dulaglutide (TRULICITY) 3 YB/6.3SL SOPN [id number] Yes Inject 3 mg into the skin once a week. Philemon Kingdom, Booker Taking Active   ?         ?Med Note Charlton Haws   Wed Jun 16, 2020 10:26 AM) [facility]  ?esomeprazole (NEXIUM) 40 MG capsule [id number] Yes Take 40 mg by mouth daily before breakfast. Provider, Historical, Booker Taking Active Self  ?furosemide (LASIX) 20 MG tablet [id number] Yes TAKE 2 TABLETS BY MOUTH TWICE A DAY Patwardhan, Manish J, Booker Taking Active   ?gabapentin (NEURONTIN) 300 MG capsule [id number] Yes TAKE 2 CAPSULE BY MOUTH TWICE A DAY AND 1 AT BEDTIME (TOTAL 5 CAPS DAILY) Hoyt Koch, Booker Taking Active   ?glipiZIDE (GLUCOTROL) 5 MG tablet [id number] Yes TAKE 2 TABLETS (10 MG TOTAL) BY MOUTH 2 (TWO) TIMES DAILY BEFORE A MEAL. Philemon Kingdom, Booker Taking Active   ?hydrALAZINE (APRESOLINE) 25 MG tablet  [id number] Yes TAKE 1 TABLET BY MOUTH THREE TIMES A DAY Patwardhan, Manish J, Booker Taking Active   ?insulin glargine (LANTUS SOLOSTAR) 100 UNIT/ML Solostar Pen [id number] Yes Inject 50 Units into the skin at bedtim

## 2021-04-06 NOTE — Patient Instructions (Signed)
Visit Information ? ?Following are the goals we discussed today:  ? ?Manage My Medicine  ? ?Timeframe:  Long-Range Goal ?Priority:  Medium ?Start Date:      06/16/20                       ?Expected End Date:   04/07/2022                  ? ?Follow Up Date: 10/2021 ?  ?- call for medicine refill 2 or 3 days before it runs out ?- call if I am sick and can't take my medicine ?- keep a list of all the medicines I take; vitamins and herbals too ?- use a pillbox to sort medicine  ? ?Plan: Telephone follow up appointment with care management team member scheduled for:  6 months ?The patient has been provided with contact information for the care management team and has been advised to call with any health related questions or concerns.  ? ?Ellin Saba, PharmD ?Clinical Pharmacist, [facility]  ? ?Please call the care guide team at [phone] if you need to cancel or reschedule your appointment.  ? ?The patient verbalized understanding of instructions, educational materials, and care plan provided today and declined offer to receive copy of patient instructions, educational materials, and care plan.  ? ?

## 2021-04-12 ENCOUNTER — Telehealth: Payer: Self-pay

## 2021-04-12 NOTE — Telephone Encounter (Signed)
Left message for patient to call back regarding the message she left about trulicity. ?

## 2021-04-18 ENCOUNTER — Other Ambulatory Visit: Payer: Self-pay

## 2021-04-18 DIAGNOSIS — E118 Type 2 diabetes mellitus with unspecified complications: Secondary | ICD-10-CM

## 2021-04-18 MED ORDER — TRULICITY 3 MG/0.5ML ~~LOC~~ SOAJ: 3.0000 mg | SUBCUTANEOUS | 0 refills | Status: DC

## 2021-04-18 NOTE — Telephone Encounter (Signed)
Pt called requesting a refill for Trulicity. 30 day supply sent. Pt needs to call back for follow up appt. ?

## 2021-04-25 ENCOUNTER — Encounter: Payer: Self-pay | Admitting: Internal Medicine

## 2021-04-25 ENCOUNTER — Ambulatory Visit (INDEPENDENT_AMBULATORY_CARE_PROVIDER_SITE_OTHER): Payer: Medicare Other | Admitting: Internal Medicine

## 2021-04-25 VITALS — BP 126/74 | HR 85 | Ht 64.0 in | Wt 218.6 lb

## 2021-04-25 DIAGNOSIS — E118 Type 2 diabetes mellitus with unspecified complications: Secondary | ICD-10-CM

## 2021-04-25 DIAGNOSIS — Z794 Long term (current) use of insulin: Secondary | ICD-10-CM | POA: Diagnosis not present

## 2021-04-25 DIAGNOSIS — E1169 Type 2 diabetes mellitus with other specified complication: Secondary | ICD-10-CM | POA: Diagnosis not present

## 2021-04-25 DIAGNOSIS — E0500 Thyrotoxicosis with diffuse goiter without thyrotoxic crisis or storm: Secondary | ICD-10-CM | POA: Diagnosis not present

## 2021-04-25 DIAGNOSIS — J9612 Chronic respiratory failure with hypercapnia: Secondary | ICD-10-CM

## 2021-04-25 DIAGNOSIS — E1165 Type 2 diabetes mellitus with hyperglycemia: Secondary | ICD-10-CM

## 2021-04-25 DIAGNOSIS — E785 Hyperlipidemia, unspecified: Secondary | ICD-10-CM

## 2021-04-25 LAB — POCT GLYCOSYLATED HEMOGLOBIN (HGB A1C): Hemoglobin A1C: 7.4 % — AB (ref 4.0–5.6)

## 2021-04-25 LAB — TSH: TSH: 2.4 u[IU]/mL (ref 0.35–5.50)

## 2021-04-25 LAB — T4, FREE: Free T4: 0.67 ng/dL (ref 0.60–1.60)

## 2021-04-25 LAB — T3, FREE: T3, Free: 3.3 pg/mL (ref 2.3–4.2)

## 2021-04-25 MED ORDER — TRULICITY 3 MG/0.5ML ~~LOC~~ SOAJ: 3.0000 mg | SUBCUTANEOUS | 11 refills | Status: DC

## 2021-04-25 MED ORDER — GLIPIZIDE 5 MG PO TABS: ORAL_TABLET | ORAL | 3 refills | Status: DC

## 2021-04-25 MED ORDER — LANTUS SOLOSTAR 100 UNIT/ML ~~LOC~~ SOPN: 35.0000 [IU] | PEN_INJECTOR | Freq: Every day | SUBCUTANEOUS | 3 refills | Status: DC

## 2021-04-25 NOTE — Progress Notes (Signed)
Patient ID: Donna Booker, female   DOB: 04-14-1945, 76 y.o.   MRN: [id number] ? ?This visit occurred during the SARS-CoV-2 public health emergency.  Safety protocols were in place, including screening questions prior to the visit, additional usage of staff PPE, and extensive cleaning of exam room while observing appropriate contact time as indicated for disinfecting solutions.  ? ?HPI: ?Donna Booker is a 76 y.o.-year-old female, presenting for f/u for DM2, dx in 1987, insulin-dependent since 2006, uncontrolled, with complications (PN, stage 2-3 CKD)and Graves ds. Last visit 6 months ago. ? ?Interim history: ?No increased urination, blurry vision, nausea, chest pain. ?She missed the last 2 Trulicity injections 2/2 delay at the pharmacy. ? ?Graves ds  ?-Diagnosed during the admission for A. fib with RVR in 05/2017. ? ?Reviewed her TFTs: ?Lab Results  ?Component Value Date  ? TSH 2.90 10/15/2020  ? TSH 1.37 07/14/2020  ? TSH 3.94 06/28/2020  ? TSH 4.90 (H) 04/26/2020  ? TSH 5.06 (H) 02/26/2020  ? TSH 3.03 09/19/2019  ? TSH 0.46 06/05/2019  ? TSH <0.01 (L) 04/22/2019  ? TSH <0.01 (L) 03/03/2019  ? TSH <0.005 (L) 02/26/2019  ? ?Lab Results  ?Component Value Date  ? FREET4 0.81 10/15/2020  ? FREET4 1.30 07/14/2020  ? FREET4 0.79 06/28/2020  ? FREET4 0.71 04/26/2020  ? FREET4 0.67 02/26/2020  ? FREET4 0.61 09/19/2019  ? FREET4 0.63 06/05/2019  ? FREET4 0.76 04/22/2019  ? FREET4 1.98 (H) 03/03/2019  ? FREET4 0.57 (L) 12/04/2017  ? ?Lab Results  ?Component Value Date  ? T3FREE 3.2 10/15/2020  ? T3FREE 3.5 07/14/2020  ? T3FREE 3.4 06/28/2020  ? T3FREE 3.0 04/26/2020  ? T3FREE 3.0 02/26/2020  ? T3FREE 3.1 09/19/2019  ? T3FREE 3.2 06/05/2019  ? T3FREE 3.3 04/22/2019  ? T3FREE 4.4 (H) 03/03/2019  ? T3FREE 2.8 12/04/2017  ? ?Graves' antibodies were elevated ?Lab Results  ?Component Value Date  ? TSI 8.02 (H) 04/16/2017  ? ?She was started on methimazole 10 mg 2x a day and atenolol 50 mg 3x a day.Her TFTs were significantly  improved >> we decreased the methimazole to 5 mg 2X a day, but then on 5 mg daily (? When changed - by PCP? -Patient cannot remember exactly when she started her prescription for methimazole only mentioned to take it once a day...).   ?12/2017: We stopped methimazole as the TSH was 10.   ?02/2019: Patient finally return for labs: TSH was suppressed completely ?03/2019: We restarted methimazole 5 mg twice a day  ?03/2020: We decreased the methimazole dose to 5 mg daily ?04/2020: I advised her to decrease the methimazole dose to 2.5 mg daily >> SHE FORGOT, so she continued on 5 mg daily ?06/2020: Decreased MMI to 2.5 mg daily ? ?She continues Toprol-XL. ? ?Pt denies: ?- feeling nodules in neck ?- hoarseness ?- dysphagia ?- choking ?- SOB with lying down ? ?No FH of thyroid disease or thyroid cancer. No h/o radiation tx to head or neck. ?No herbal supplements. No Biotin use. No recent steroids use.  ? ?DM2: ?Reviewed HbA1c levels ?10/15/2020:  HbA1c calculated from fructosamine is much better, at 7.45%, correlating better with her blood sugars at home.  ?Lab Results  ?Component Value Date  ? HGBA1C 9.5 (A) 10/15/2020  ? HGBA1C 6.1 (A) 06/28/2020  ? HGBA1C 6.8 (H) 02/26/2020  ? HGBA1C 8.1 (A) 11/25/2019  ? HGBA1C 8.6 (A) 09/19/2019  ? HGBA1C 7.3 (A) 06/05/2019  ? HGBA1C 8.8 (H) 02/26/2019  ?  HGBA1C 13.0 04/26/2018  ? HGBA1C 14.1 (H) 03/19/2018  ? HGBA1C 13.1 (A) 12/28/2017  ? HGBA1C 9.8 (H) 07/14/2017  ? HGBA1C 8.0 02/01/2016  ? HGBA1C 8.3 (H) 10/20/2015  ? HGBA1C 8.4 07/29/2015  ? HGBA1C 10.0 04/27/2015  ? HGBA1C 9.5 (H) 12/29/2014  ? HGBA1C 9.1 (H) 09/01/2014  ? ?She is on: ?- Glipizide 10 g 2x a day before meals >> 10 mg in a.m. and 5 mg in p.m. (but in the last month, she was taking 10 mg twice a day, per advice from the pharmacy...) ?- Levemir 40 >> 50 >> 40-50 >> 40 >> 50 >> 40 units at night  ?- Trulicity 1.5 mg weekly - started 12/2018 >> 3 mg weekly  ?She was previously on a VGo patch pump.   ?She also tried  Bydureon (02/2013-10/2014) >> made her hungry, did not lower sugars ? ?She checks sugars once a day: ?- am: 73-120, 131, 168 >>  73-114 >> 91-151, 202 >> 74-98, 115, 120 ?- 2h after b'fast: n/c >> 167 >> 99-164 >> 144 >> 84, 95-143, 154 ?- before lunch: 221 (cantaloupe) >> n/c >> 136, 158 >> n/c >> 137, 140 ?- 2h after lunch: 165-185 >> n/c >> 136, 140 >> 340 >> n/c ?- before dinner: 120-125 >> ? >> 134-144 >> 108, 191 >> n/c >> 110 ?- 2h after dinner: n/c >> 175-201 >> n/c >> 194  >> 205 ?- bedtime: 192, 265 >> 130-137 >> ? >> 207 >> n/c ?- nighttime: n/c ?Lowest sugar was LO, 48 (gastroenteritis) 02/2020 >> 73 >> 91 >> 48; she has hypoglycemia awareness in the 70s. ?Highest sugar was 298 ... >> 207 (Chick fil-a) >> 194 >> 340 (cake) >> 205 ? ?Glucometer: AccuChek ? ?-+ CKD, last BUN/creatinine:  ?Lab Results  ?Component Value Date  ? BUN 27 (H) 02/28/2021  ? CREATININE 1.44 (H) 02/28/2021  ?04/25/2018: Glucose 269, BUN/creatinine 18/1.05 ?Lab Results  ?Component Value Date  ? MICRALBCREAT 35.4 (H) 02/28/2021  ? MICRALBCREAT 110.0 (H) 09/19/2019  ? MICRALBCREAT 192.6 (H) 06/05/2019  ?Previously on olmesartan, now off (?) ? ?-+ HL; last set of lipids: ?Lab Results  ?Component Value Date  ? CHOL 138 02/28/2021  ? HDL 45.00 02/28/2021  ? [place] 73 02/28/2021  ? LDLDIRECT 98.0 03/19/2018  ? TRIG 100.0 02/28/2021  ? CHOLHDL 3 02/28/2021  ?On Crestor 10. ? ?- last eye exam was in 03/21/2020: No DR reportedly. Coming up this week. ? ?- no numbness and tingling in her feet.  Latest foot exam was in 02/2021. ? ?She previously had hyperkalemia, which we addressed in the past.  This resolved. ?- Possibly 2/2 ACE inhibitor, potassium supplementation +/-beta-blocker ?- After an initial K of 7.5 in 07/2017 >> K levels normalized after stopping ACEI and K supplements ?Lab Results  ?Component Value Date  ? K 4.2 02/28/2021  ? K 3.8 02/26/2020  ? K 4.9 02/26/2019  ? K 4.1 04/25/2018  ? K 3.9 03/19/2018  ?Of note, a cosyntropin  stimulation test was normal and also aldosterone and renin were normal while in the hospital.   ?She had AKI on admission then. ? ?Her son died in June 29, 2017 -he had severe heart failure. ? ?ROS: ?+ See HPI ? ?I reviewed pt's medications, allergies, PMH, social hx, family hx, and changes were documented in the history of present illness. Otherwise, unchanged from my initial visit note. ? ?Past Medical History:  ?Diagnosis Date  ? Afib ([place])   ? Diabetes mellitus   ?  Graves disease   ? Heart failure ([place])   ? Hypertension   ? Hyperthyroidism   ? ?Past Surgical History:  ?Procedure Laterality Date  ? BREAST SURGERY    ? ?Social History  ? ?Social History  ? Marital Status: Married  ?  Spouse Name: N/A  ? Number of Children: 1  ? 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 ?Social History Main Topics  ? Smoking status: Never Smoker   ? Smokeless tobacco: Not on file  ? Alcohol Use: No  ? Drug Use: No  ? ?Current Outpatient Medications on File Prior to Visit  ?Medication Sig Dispense Refill  ? Abatacept (ORENCIA [place]) Inject into the skin every 30 (thirty) days.    ? allopurinol (ZYLOPRIM) 300 MG tablet TAKE 1 TABLET BY MOUTH EVERY DAY 90 tablet 1  ? amLODipine (NORVASC) 10 MG tablet TAKE 1 TABLET BY MOUTH EVERY DAY 90 tablet 1  ? apixaban (ELIQUIS) 5 MG TABS tablet Take 1 tablet (5 mg total) by mouth 2 (two) times daily. 180 tablet 3  ? Dulaglutide (TRULICITY) 3 0000000 SOPN Inject 3 mg into the skin once a week. 2 mL 0  ? esomeprazole (NEXIUM) 40 MG capsule Take 40 mg by mouth daily before breakfast.    ? furosemide (LASIX) 20 MG tablet TAKE 2 TABLETS BY MOUTH TWICE A DAY 360 tablet 1  ? gabapentin (NEURONTIN) 300 MG capsule TAKE 2 CAPSULE BY MOUTH TWICE A DAY AND 1 AT BEDTIME (TOTAL 5 CAPS DAILY) 150 capsule 5  ? glipiZIDE (GLUCOTROL) 5 MG tablet TAKE 2 TABLETS (10 MG TOTAL) BY MOUTH 2 (TWO) TIMES DAILY BEFORE A MEAL. 120 tablet 0  ? hydrALAZINE (APRESOLINE) 25 MG tablet TAKE 1 TABLET BY MOUTH THREE TIMES A DAY 270  tablet 0  ? insulin glargine (LANTUS SOLOSTAR) 100 UNIT/ML Solostar Pen Inject 50 Units into the skin at bedtime. 30 mL 1  ? isosorbide mononitrate (IMDUR) 30 MG 24 hr tablet TAKE 1 TABLET BY MOUTH EVERY DAY 9

## 2021-04-25 NOTE — Patient Instructions (Addendum)
Please decrease: ?- Lantus 35 units at night ?- Glipizide 10 mg before b'fast and 5 mg before dinner ? ?Continue: ?- Trulicity 3 mg weekly ? ?Also, continue: ?- Methimazole 2.5 mg daily ? ?Please stop at the lab. ? ?Please return in 4-6 months with your sugar log.  ?

## 2021-04-29 ENCOUNTER — Ambulatory Visit: Payer: Medicare Other | Admitting: Internal Medicine

## 2021-05-01 DIAGNOSIS — E785 Hyperlipidemia, unspecified: Secondary | ICD-10-CM

## 2021-05-01 DIAGNOSIS — I10 Essential (primary) hypertension: Secondary | ICD-10-CM

## 2021-05-01 DIAGNOSIS — E118 Type 2 diabetes mellitus with unspecified complications: Secondary | ICD-10-CM | POA: Diagnosis not present

## 2021-05-01 DIAGNOSIS — E1169 Type 2 diabetes mellitus with other specified complication: Secondary | ICD-10-CM

## 2021-05-02 ENCOUNTER — Ambulatory Visit (INDEPENDENT_AMBULATORY_CARE_PROVIDER_SITE_OTHER): Payer: Medicare Other | Admitting: Internal Medicine

## 2021-05-02 ENCOUNTER — Encounter: Payer: Self-pay | Admitting: Internal Medicine

## 2021-05-02 VITALS — BP 130/74 | HR 79 | Resp 18 | Ht 64.0 in | Wt 217.8 lb

## 2021-05-02 DIAGNOSIS — I480 Paroxysmal atrial fibrillation: Secondary | ICD-10-CM | POA: Diagnosis not present

## 2021-05-02 DIAGNOSIS — R0609 Other forms of dyspnea: Secondary | ICD-10-CM | POA: Diagnosis not present

## 2021-05-02 DIAGNOSIS — M059 Rheumatoid arthritis with rheumatoid factor, unspecified: Secondary | ICD-10-CM | POA: Diagnosis not present

## 2021-05-02 DIAGNOSIS — I4891 Unspecified atrial fibrillation: Secondary | ICD-10-CM

## 2021-05-02 DIAGNOSIS — E559 Vitamin D deficiency, unspecified: Secondary | ICD-10-CM

## 2021-05-02 DIAGNOSIS — R5383 Other fatigue: Secondary | ICD-10-CM | POA: Diagnosis not present

## 2021-05-02 LAB — COMPREHENSIVE METABOLIC PANEL
ALT: 12 U/L (ref 0–35)
AST: 15 U/L (ref 0–37)
Albumin: 4.1 g/dL (ref 3.5–5.2)
Alkaline Phosphatase: 95 U/L (ref 39–117)
BUN: 28 mg/dL — ABNORMAL HIGH (ref 6–23)
CO2: 24 mEq/L (ref 19–32)
Calcium: 9.4 mg/dL (ref 8.4–10.5)
Chloride: 100 mEq/L (ref 96–112)
Creatinine, Ser: 1.46 mg/dL — ABNORMAL HIGH (ref 0.40–1.20)
GFR: 35.41 mL/min — ABNORMAL LOW (ref >60.00)
Glucose, Bld: 190 mg/dL — ABNORMAL HIGH (ref 70–99)
Potassium: 4.1 mEq/L (ref 3.5–5.1)
Sodium: 137 mEq/L (ref 135–145)
Total Bilirubin: 0.2 mg/dL (ref 0.2–1.2)
Total Protein: 7.9 g/dL (ref 6.0–8.3)

## 2021-05-02 LAB — CBC
HCT: 33.7 % — ABNORMAL LOW (ref 36.0–46.0)
Hemoglobin: 10.4 g/dL — ABNORMAL LOW (ref 12.0–15.0)
MCHC: 30.7 g/dL (ref 30.0–36.0)
MCV: 69.5 fl — ABNORMAL LOW (ref 78.0–100.0)
Platelets: 383 10*3/uL (ref 150.0–400.0)
RBC: 4.85 Mil/uL (ref 3.87–5.11)
RDW: 19.9 % — ABNORMAL HIGH (ref 11.5–15.5)
WBC: 14 10*3/uL — ABNORMAL HIGH (ref 4.0–10.5)

## 2021-05-02 LAB — FERRITIN: Ferritin: 58.3 ng/mL (ref 10.0–291.0)

## 2021-05-02 LAB — VITAMIN B12: Vitamin B-12: 416 pg/mL (ref 211–911)

## 2021-05-02 MED ORDER — PREDNISONE 20 MG PO TABS: 40.0000 mg | ORAL_TABLET | Freq: Every day | ORAL | 0 refills | Status: DC

## 2021-05-02 MED ORDER — ALBUTEROL SULFATE HFA 108 (90 BASE) MCG/ACT IN AERS: 2.0000 | INHALATION_SPRAY | Freq: Four times a day (QID) | RESPIRATORY_TRACT | 2 refills | Status: DC | PRN

## 2021-05-02 NOTE — Patient Instructions (Signed)
We have sent in albuterol inhaler to try for the breathing to see if this helps. ? ?We have sent in prednisone to take 2 pills daily for 5 days. ? ? ?

## 2021-05-02 NOTE — Progress Notes (Signed)
? ?  Subjective:  ? ?Patient ID: Donna Booker, female    DOB: Apr 25, 1945, 76 y.o.   MRN: [id number] ? ?HPI ?The patient is a 76 YO female coming in for SOB and fatigue. Is unable to do much and cannot cook due to not being strong enough to stand for more than a minute or two. No back pain per say with standing or numbness but some kind of weakness in her legs.  ? ?Review of Systems  ?Constitutional:  Positive for activity change, appetite change and fatigue.  ?HENT: Negative.    ?Eyes: Negative.   ?Respiratory:  Positive for shortness of breath. Negative for cough and chest tightness.   ?Cardiovascular:  Negative for chest pain, palpitations and leg swelling.  ?Gastrointestinal:  Negative for abdominal distention, abdominal pain, constipation, diarrhea, nausea and vomiting.  ?Musculoskeletal: Negative.   ?Skin: Negative.   ?Neurological:  Positive for weakness.  ?Psychiatric/Behavioral: Negative.    ? ?Objective:  ?Physical Exam ?Constitutional:   ?   Appearance: She is well-developed. She is obese.  ?HENT:  ?   Head: Normocephalic and atraumatic.  ?Cardiovascular:  ?   Rate and Rhythm: Normal rate and regular rhythm.  ?Pulmonary:  ?   Effort: Pulmonary effort is normal. No respiratory distress.  ?   Breath sounds: Normal breath sounds. No wheezing or rales.  ?Abdominal:  ?   General: Bowel sounds are normal. There is no distension.  ?   Palpations: Abdomen is soft.  ?   Tenderness: There is no abdominal tenderness. There is no rebound.  ?Musculoskeletal:  ?   Cervical back: Normal range of motion.  ?Skin: ?   General: Skin is warm and dry.  ?Neurological:  ?   Mental Status: She is alert and oriented to person, place, and time.  ?   Coordination: Coordination abnormal.  ?   Comments: Slow to stand and slow gait  ? ? ?Vitals:  ? 05/02/21 1443  ?BP: 130/74  ?Pulse: 79  ?Resp: 18  ?SpO2: 97%  ?Weight: 217 lb 12.8 oz (98.8 kg)  ?Height: 5\' 4"  (1.626 m)  ? ? ?This visit occurred during the SARS-CoV-2 public health  emergency.  Safety protocols were in place, including screening questions prior to the visit, additional usage of staff PPE, and extensive cleaning of exam room while observing appropriate contact time as indicated for disinfecting solutions.  ? ?Assessment & Plan:  ?Visit time 30 minutes in face to face communication with patient and coordination of care, additional 15 minutes spent in record review, coordination or care, ordering tests, communicating/referring to other healthcare professionals, documenting in medical records all on the same day of the visit for total time 45 minutes spent on the visit.  ? ?

## 2021-05-03 DIAGNOSIS — R5383 Other fatigue: Secondary | ICD-10-CM | POA: Insufficient documentation

## 2021-05-03 DIAGNOSIS — E559 Vitamin D deficiency, unspecified: Secondary | ICD-10-CM | POA: Insufficient documentation

## 2021-05-03 LAB — VITAMIN D 25 HYDROXY (VIT D DEFICIENCY, FRACTURES): VITD: 52.78 ng/mL (ref 30.00–100.00)

## 2021-05-03 NOTE — Assessment & Plan Note (Signed)
She feels she may be in a flare up but no swollen joints on exam. She is having a hard time getting up in the morning she is unsure if this is stiffness. Rx prednisone course and if no improvement likely not RA flare.  ?

## 2021-05-03 NOTE — Assessment & Plan Note (Addendum)
Unclear cause. She is unable to cook due to being unable to stand still for any length of time. Unsure if she gets weakness with repetitive motions. Known prior A fib but not present on exam today. Has seen cardiology and CXR without fluid and echo and stress test without changes. Checking labs today to help clarify including CBC, CMP, vitamin D, vitamin B12. Recent thyroid levels normal with endo and Hga1c controlled. She does have RA making autoimmune disease more likely. Rx prednisone 5 day course and she will let us know how this helps. Rx albuterol to see if this can help her SOB. If no improvement she should have formal PFTs. I did talk to her about OSA and she does not want to be tested and knows of several family members who were harmed by using CPAP.  ?

## 2021-05-03 NOTE — Assessment & Plan Note (Signed)
Checking vitamin D level and adjust as needed. Not taking replacement currently.  ?

## 2021-05-03 NOTE — Assessment & Plan Note (Signed)
Checking CBC, CMP, ferritin, B12, vitamin D. She just had thyroid testing within normal limits. Cardiology testing without ischemic cause. She has not had formal PFTs or sleep study. She does not want to pursue sleep study as she knows several family members harmed by CPAP and will not use regardless herself. We did discuss if OSA is causing her fatigue and SOB this could be helped.  ?

## 2021-05-03 NOTE — Assessment & Plan Note (Signed)
Cardiology is concerned that her weight/weight gain is causing the SOB on exertion. It is unclear if related but she is struggling with weight since she is unable to move enough to even cook for herself.  ?

## 2021-05-03 NOTE — Assessment & Plan Note (Signed)
She sounds to be in sinus today and it is unclear if this is related to her fatigue. Cardiology does not feel this is related. She is on eliquis for anticoagulation 5 mg BID and will continue. She is rate controlled with metoprolol xl 100 mg daily. HR at goal. Continue. ?

## 2021-05-23 ENCOUNTER — Other Ambulatory Visit: Payer: Self-pay | Admitting: Internal Medicine

## 2021-05-23 DIAGNOSIS — E0500 Thyrotoxicosis with diffuse goiter without thyrotoxic crisis or storm: Secondary | ICD-10-CM

## 2021-06-03 ENCOUNTER — Other Ambulatory Visit: Payer: Self-pay | Admitting: Internal Medicine

## 2021-06-03 DIAGNOSIS — E0500 Thyrotoxicosis with diffuse goiter without thyrotoxic crisis or storm: Secondary | ICD-10-CM

## 2021-06-13 ENCOUNTER — Telehealth: Payer: Self-pay | Admitting: Pharmacist

## 2021-06-13 ENCOUNTER — Other Ambulatory Visit: Payer: Self-pay | Admitting: Cardiology

## 2021-06-13 ENCOUNTER — Other Ambulatory Visit: Payer: Self-pay | Admitting: Internal Medicine

## 2021-06-13 DIAGNOSIS — R0609 Other forms of dyspnea: Secondary | ICD-10-CM

## 2021-06-13 NOTE — Telephone Encounter (Signed)
Hey Dr Virgina Jock, Patient has been on Eliquis  5mg  BID utilizing a PAP to help with the cost of the medication.She was told today that she was denied for Patient Assistance and cost of the medication is $100. She tells me she can't afford this. She spoke with insurance and insurance will pay for warfarin. Would it be appropriate to switch the patient to Warfarin? I can help bridge and follow the patient INR. OR would you like to have the patient continue to apply for PAP programs in hopes of finding coverage? Thanks

## 2021-06-13 NOTE — Telephone Encounter (Signed)
Sorry to hear that. Would Xarelto be an option? If no DOAC would be approved by insurance, we can use warfarin.  Thanks MJP

## 2021-06-20 ENCOUNTER — Other Ambulatory Visit: Payer: Self-pay

## 2021-06-20 DIAGNOSIS — I480 Paroxysmal atrial fibrillation: Secondary | ICD-10-CM

## 2021-06-20 MED ORDER — APIXABAN 5 MG PO TABS: 5.0000 mg | ORAL_TABLET | Freq: Two times a day (BID) | ORAL | 3 refills | Status: DC

## 2021-07-25 ENCOUNTER — Encounter: Payer: Self-pay | Admitting: Internal Medicine

## 2021-07-25 ENCOUNTER — Ambulatory Visit (INDEPENDENT_AMBULATORY_CARE_PROVIDER_SITE_OTHER): Payer: Medicare Other | Admitting: Internal Medicine

## 2021-07-25 DIAGNOSIS — Z800 Family history of malignant neoplasm of digestive organs: Secondary | ICD-10-CM | POA: Insufficient documentation

## 2021-07-25 DIAGNOSIS — M549 Dorsalgia, unspecified: Secondary | ICD-10-CM | POA: Diagnosis not present

## 2021-07-25 DIAGNOSIS — K219 Gastro-esophageal reflux disease without esophagitis: Secondary | ICD-10-CM | POA: Insufficient documentation

## 2021-07-25 DIAGNOSIS — Z9181 History of falling: Secondary | ICD-10-CM | POA: Insufficient documentation

## 2021-07-25 DIAGNOSIS — R1031 Right lower quadrant pain: Secondary | ICD-10-CM | POA: Insufficient documentation

## 2021-07-25 MED ORDER — ONDANSETRON HCL 4 MG PO TABS: 4.0000 mg | ORAL_TABLET | Freq: Three times a day (TID) | ORAL | 0 refills | Status: DC | PRN

## 2021-07-25 MED ORDER — TIZANIDINE HCL 2 MG PO TABS: 2.0000 mg | ORAL_TABLET | Freq: Four times a day (QID) | ORAL | 0 refills | Status: DC | PRN

## 2021-07-25 MED ORDER — HYDROCODONE-ACETAMINOPHEN 5-325 MG PO TABS: 1.0000 | ORAL_TABLET | Freq: Three times a day (TID) | ORAL | 0 refills | Status: DC | PRN
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
## 2021-07-25 NOTE — Patient Instructions (Addendum)
It is okay to take tylenol for pain if you think this is helping at all.  We have sent in tizanidine which is a muscle relaxer to use up to 3 times a day.  We have sent in hydrocodone which is a strong pain reliever to help with pain, in case of nausea we have sent in zofran to use.  You can try heat for the pain as well.  This likely will take 2-6 weeks to fully recover.

## 2021-07-28 DIAGNOSIS — M549 Dorsalgia, unspecified: Secondary | ICD-10-CM | POA: Insufficient documentation
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
## 2021-08-01 ENCOUNTER — Other Ambulatory Visit: Payer: Self-pay | Admitting: Internal Medicine

## 2021-08-01 ENCOUNTER — Other Ambulatory Visit: Payer: Self-pay | Admitting: Cardiology

## 2021-08-03 ENCOUNTER — Encounter: Payer: Self-pay | Admitting: Cardiology

## 2021-08-03 ENCOUNTER — Ambulatory Visit: Payer: Medicare Other | Admitting: Cardiology

## 2021-08-03 VITALS — BP 159/75 | HR 88 | Temp 98.0°F | Resp 16 | Ht 64.0 in | Wt 215.0 lb

## 2021-08-03 DIAGNOSIS — I480 Paroxysmal atrial fibrillation: Secondary | ICD-10-CM

## 2021-08-03 DIAGNOSIS — I10 Essential (primary) hypertension: Secondary | ICD-10-CM

## 2021-08-03 DIAGNOSIS — Z8639 Personal history of other endocrine, nutritional and metabolic disease: Secondary | ICD-10-CM

## 2021-08-03 NOTE — Progress Notes (Signed)
Follow up visit  Subjective:   Donna Booker, female    DOB: February 14, 1945, 76 y.o.   MRN: [id number]   Chief Complaint  Patient presents with   Atrial Fibrillation   Dyspnea on exertion   Follow-up    6 month     HPI  76 year old African American female withf Graves' disease with hyperthyroidism, paroxysmal atrial fibrillation, mild PH, HFpEF (secondary to Afib), uncontrolled type II diabetes mellitus with diabetic polyneuropathy, CKD stage III.  Patient is doing well/ She denies chest pain, shortness of breath, palpitations, leg edema, orthopnea, PND, TIA/syncope. Blood pressure elevated today, but general;ly fairly well controlled (also reviewed home B readings through remote patient monitoring).    Current Outpatient Medications:    Abatacept (ORENCIA [place]), Inject into the skin every 30 (thirty) days., Disp: , Rfl:    albuterol (VENTOLIN HFA) 108 (90 Base) MCG/ACT inhaler, Inhale 2 puffs into the lungs every 6 (six) hours as needed for wheezing or shortness of breath., Disp: 8 g, Rfl: 2   allopurinol (ZYLOPRIM) 300 MG tablet, TAKE 1 TABLET BY MOUTH EVERY DAY, Disp: 90 tablet, Rfl: 1   amLODipine (NORVASC) 10 MG tablet, TAKE 1 TABLET BY MOUTH EVERY DAY, Disp: 90 tablet, Rfl: 1   apixaban (ELIQUIS) 5 MG TABS tablet, Take 1 tablet (5 mg total) by mouth 2 (two) times daily., Disp: 180 tablet, Rfl: 3   Dulaglutide (TRULICITY) 3 ZL/9.3TT SOPN, Inject 3 mg into the skin once a week., Disp: 2 mL, Rfl: 11   esomeprazole (NEXIUM) 40 MG capsule, Take 40 mg by mouth daily before breakfast., Disp: , Rfl:    furosemide (LASIX) 20 MG tablet, TAKE 2 TABLETS BY MOUTH TWICE A DAY, Disp: 360 tablet, Rfl: 1   gabapentin (NEURONTIN) 300 MG capsule, TAKE 2 CAPSULE BY MOUTH TWICE A DAY AND 1 AT BEDTIME (TOTAL 5 CAPS DAILY) (Patient taking differently: Take 300 mg by mouth daily.), Disp: 150 capsule, Rfl: 5   glipiZIDE (GLUCOTROL) 5 MG tablet, TAKE 2 TABLETS (10 MG TOTAL) BY MOUTH IN AM AND 5 MG BEFORE  SUPPER., Disp: 360 tablet, Rfl: 1   hydrALAZINE (APRESOLINE) 25 MG tablet, TAKE 1 TABLET BY MOUTH THREE TIMES A DAY, Disp: 270 tablet, Rfl: 0   HYDROcodone-acetaminophen (NORCO/VICODIN) 5-325 MG tablet, Take 1 tablet by mouth 3 (three) times daily as needed for moderate pain., Disp: 15 tablet, Rfl: 0   insulin glargine (LANTUS SOLOSTAR) 100 UNIT/ML Solostar Pen, Inject 35 Units into the skin at bedtime., Disp: 30 mL, Rfl: 3   isosorbide mononitrate (IMDUR) 30 MG 24 hr tablet, TAKE 1 TABLET BY MOUTH EVERY DAY, Disp: 90 tablet, Rfl: 1   methimazole (TAPAZOLE) 5 MG tablet, TAKE 1/2 TABLET BY MOUTH EVERY DAY, Disp: 90 tablet, Rfl: 0   metoprolol succinate (TOPROL-XL) 100 MG 24 hr tablet, TAKE 1 TABLET BY MOUTH DAILY. TAKE WITH OR IMMEDIATELY FOLLOWING A MEAL., Disp: 90 tablet, Rfl: 2   ondansetron (ZOFRAN) 4 MG tablet, Take 1 tablet (4 mg total) by mouth every 8 (eight) hours as needed for nausea or vomiting., Disp: 20 tablet, Rfl: 0   predniSONE (DELTASONE) 20 MG tablet, Take 2 tablets (40 mg total) by mouth daily with breakfast., Disp: 10 tablet, Rfl: 0   rosuvastatin (CRESTOR) 10 MG tablet, TAKE 1 TABLET BY MOUTH EVERY DAY, Disp: 90 tablet, Rfl: 1   tiZANidine (ZANAFLEX) 2 MG tablet, Take 1 tablet (2 mg total) by mouth every 6 (six) hours as needed for muscle spasms., Disp:  30 tablet, Rfl: 0   VOLTAREN 1 % GEL, Apply 2 g topically 4 (four) times daily. (Patient taking differently: Apply 2 g topically as needed.), Disp: 100 g, Rfl: 3  Cardiovascular studies:   EKG 08/03/2021: Sinus rhythm 83 bpm  Old anteroseptal infarct  Lexiscan Nuclear stress test 01/18/2020: Nondiagnostic ECG stress. The heart rate response was consistent with Lexiscan.  Myocardial perfusion is normal. Overall LV systolic function is normal without regional wall motion abnormalities. Stress LV EF: 64%.  No previous exam available for comparison. Low risk.   Echocardiogram 12/06/2020: Left ventricle cavity is normal in size.  Moderate concentric hypertrophy of the left ventricle. Normal global wall motion. Normal LV systolic function with EF 62%. Doppler evidence of grade I (impaired) diastolic dysfunction, normal LAP.  Trace MR, trace TR. No evidence of pulmonary hypertension. Compared to previous study in 2019, atrial dilatation, mild PH now absent.  Renal artery duplex ([facility]) 05/23/2017: No renal artery stenosis  Recent labs: 05/02/2021: Glucose 190, BUN/Cr 28/1.46. EGFR 35. Na/K 137/4.1. Rest of the CMP normal H/H 10/33. MCV 69. Platelets 383 HbA1C 7.4% Chol 133, TG 100, HDL 45, LDL 73 TSH 2.4 normal  Review of Systems  Constitutional: Negative for chills and fever.  Cardiovascular:  Negative for chest pain, dyspnea on exertion, leg swelling, palpitations and syncope.         Vitals:   08/03/21 1111  BP: (!) 159/75  Pulse: 88  Resp: 16  Temp: 98 F (36.7 C)  SpO2: 97%     Body mass index is 36.9 kg/m. Filed Weights   08/03/21 1111  Weight: 215 lb (97.5 kg)     Objective:   Physical Exam Vitals and nursing note reviewed.  Constitutional:      General: She is not in acute distress. Neck:     Vascular: No JVD.  Cardiovascular:     Rate and Rhythm: Normal rate and regular rhythm.     Heart sounds: Normal heart sounds. No murmur heard. Pulmonary:     Effort: Pulmonary effort is normal.     Breath sounds: Normal breath sounds. No wheezing or rales.  Musculoskeletal:     Right lower leg: No tenderness. No edema.     Left lower leg: No tenderness. No edema.           Assessment & Recommendations:    76 year old African American female withf Graves' disease with hyperthyroidism, paroxysmal atrial fibrillation, mild PH, HFpEF (secondary to Afib), uncontrolled type II diabetes mellitus with diabetic polyneuropathy, CKD stage III.  PAF: Maintaining sinus rhythm. CHA2DS2VASc score 4, annual stroke risk 5%. Continue anticaogualtion with eliquis 5 mg bid.    Hypertension: Generally well controlled at home. No changes made today. In future, if BP remains elevated, could consider switching Imdur to Bidil. She should not be on ACEi/ARB/aldosterone antagonist due to pevious episodes of hyperkalemia.   Grave's disease, DM:  F/u w/endocrinology   F/u in 6 months  Maury Bamba Esther Hardy, MD [facility] Pager: [phone] Office: [phone] If no answer Cell [phone]

## 2021-08-15 ENCOUNTER — Ambulatory Visit (INDEPENDENT_AMBULATORY_CARE_PROVIDER_SITE_OTHER): Payer: Medicare Other | Admitting: Internal Medicine

## 2021-08-15 ENCOUNTER — Encounter: Payer: Self-pay | Admitting: Internal Medicine

## 2021-08-15 VITALS — BP 126/78 | HR 87 | Resp 18 | Ht 64.0 in | Wt 213.4 lb

## 2021-08-15 DIAGNOSIS — M542 Cervicalgia: Secondary | ICD-10-CM

## 2021-08-15 DIAGNOSIS — M549 Dorsalgia, unspecified: Secondary | ICD-10-CM

## 2021-08-15 MED ORDER — HYDROCODONE-ACETAMINOPHEN 5-325 MG PO TABS: 1.0000 | ORAL_TABLET | Freq: Three times a day (TID) | ORAL | 0 refills | Status: DC | PRN

## 2021-08-15 MED ORDER — TIZANIDINE HCL 2 MG PO TABS: 2.0000 mg | ORAL_TABLET | Freq: Four times a day (QID) | ORAL | 1 refills | Status: DC | PRN

## 2021-08-15 NOTE — Progress Notes (Signed)
   Subjective:   Patient ID: Donna Booker, female    DOB: Dec 27, 1945, 76 y.o.   MRN: [id number]  HPI The patient is a 76 YO female coming in for follow up.  Review of Systems  Constitutional: Negative.   HENT: Negative.    Eyes: Negative.   Respiratory:  Negative for cough, chest tightness and shortness of breath.   Cardiovascular:  Negative for chest pain, palpitations and leg swelling.  Gastrointestinal:  Negative for abdominal distention, abdominal pain, constipation, diarrhea, nausea and vomiting.  Musculoskeletal:  Positive for arthralgias and back pain.  Skin: Negative.   Neurological: Negative.   Psychiatric/Behavioral: Negative.      Objective:  Physical Exam Constitutional:      Appearance: She is well-developed.  HENT:     Head: Normocephalic and atraumatic.  Cardiovascular:     Rate and Rhythm: Normal rate and regular rhythm.  Pulmonary:     Effort: Pulmonary effort is normal. No respiratory distress.     Breath sounds: Normal breath sounds. No wheezing or rales.  Abdominal:     General: Bowel sounds are normal. There is no distension.     Palpations: Abdomen is soft.     Tenderness: There is no abdominal tenderness. There is no rebound.  Musculoskeletal:        General: Tenderness present.     Cervical back: Normal range of motion.  Skin:    General: Skin is warm and dry.  Neurological:     Mental Status: She is alert and oriented to person, place, and time.     Coordination: Coordination normal.     Vitals:   08/15/21 1415  BP: 126/78  Pulse: 87  Resp: 18  SpO2: 97%  Weight: 213 lb 6.4 oz (96.8 kg)  Height: 5\' 4"  (1.626 m)    Assessment & Plan:

## 2021-08-15 NOTE — Patient Instructions (Addendum)
We will get you in with physical therapy and have sent in the refill of the muscle relaxer and the hydrocodone.

## 2021-08-18 ENCOUNTER — Encounter: Payer: Self-pay | Admitting: Internal Medicine

## 2021-08-18 NOTE — Assessment & Plan Note (Signed)
Refill tizanidine as this is helping with symptoms well. Rx small amount of hydrocodone for better pain management at night time. Referral to PT for help with expediting recovery. Reminded about likely 4-6 weeks to recovery and she is about 2-3 weeks into symptoms.

## 2021-08-23 ENCOUNTER — Other Ambulatory Visit: Payer: Self-pay | Admitting: Internal Medicine

## 2021-08-30 ENCOUNTER — Encounter: Payer: Self-pay | Admitting: Internal Medicine

## 2021-08-30 ENCOUNTER — Ambulatory Visit (INDEPENDENT_AMBULATORY_CARE_PROVIDER_SITE_OTHER): Payer: Medicare Other | Admitting: Internal Medicine

## 2021-08-30 VITALS — BP 120/80 | HR 86 | Ht 64.0 in | Wt 214.8 lb

## 2021-08-30 DIAGNOSIS — E0500 Thyrotoxicosis with diffuse goiter without thyrotoxic crisis or storm: Secondary | ICD-10-CM

## 2021-08-30 DIAGNOSIS — E1165 Type 2 diabetes mellitus with hyperglycemia: Secondary | ICD-10-CM

## 2021-08-30 DIAGNOSIS — E1169 Type 2 diabetes mellitus with other specified complication: Secondary | ICD-10-CM | POA: Diagnosis not present

## 2021-08-30 DIAGNOSIS — E785 Hyperlipidemia, unspecified: Secondary | ICD-10-CM

## 2021-08-30 DIAGNOSIS — Z794 Long term (current) use of insulin: Secondary | ICD-10-CM | POA: Diagnosis not present

## 2021-08-30 LAB — POCT GLYCOSYLATED HEMOGLOBIN (HGB A1C): Hemoglobin A1C: 9 % — AB (ref 4.0–5.6)

## 2021-08-30 NOTE — Patient Instructions (Addendum)
Please continue: - Lantus 35 units at night - Glipizide 10 mg before b'fast and 5 mg before dinner  Restart: - Trulicity 3 mg weekly  Also, continue: - Methimazole 2.5 mg daily  Please return in 4 months with your sugar log.
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
## 2021-09-07 ENCOUNTER — Telehealth: Payer: Self-pay

## 2021-09-07 NOTE — Telephone Encounter (Signed)
Pt called with questions about patient assistane application. Pt was contacted and advised her portion to complete and to bring the completed application when completed.

## 2021-09-19 ENCOUNTER — Other Ambulatory Visit: Payer: Self-pay | Admitting: Internal Medicine

## 2021-09-20 ENCOUNTER — Other Ambulatory Visit: Payer: Self-pay | Admitting: Cardiology

## 2021-09-20 ENCOUNTER — Other Ambulatory Visit: Payer: Self-pay | Admitting: Internal Medicine

## 2021-09-20 DIAGNOSIS — I10 Essential (primary) hypertension: Secondary | ICD-10-CM

## 2021-09-20 DIAGNOSIS — R600 Localized edema: Secondary | ICD-10-CM

## 2021-09-26 ENCOUNTER — Telehealth: Payer: Self-pay

## 2021-09-26 NOTE — Telephone Encounter (Signed)
MEDICATION: gabapentin (NEURONTIN) 300 MG capsule  PHARMACY: [id number] - [place], [place]  Comments: Patient is completely out and wants to know if she can get more than just 30 day refill   **Let patient know to contact pharmacy at the end of the day to make sure medication is ready. **  ** Please notify patient to allow 48-72 hours to process**  **Encourage patient to contact the pharmacy for refills or they can request refills through [facility]**

## 2021-09-27 ENCOUNTER — Other Ambulatory Visit: Payer: Self-pay | Admitting: Internal Medicine

## 2021-09-27 MED ORDER — GABAPENTIN 300 MG PO CAPS: 300.0000 mg | ORAL_CAPSULE | Freq: Three times a day (TID) | ORAL | 5 refills | Status: DC

## 2021-09-27 NOTE — Telephone Encounter (Signed)
Please resent.

## 2021-09-27 NOTE — Telephone Encounter (Signed)
Pt dropped off completed application. Placed on provider desk to be completed and signed.

## 2021-09-27 NOTE — Telephone Encounter (Signed)
Refilled

## 2021-10-05 ENCOUNTER — Telehealth: Payer: Medicare Other

## 2021-11-23 ENCOUNTER — Telehealth: Payer: Self-pay

## 2021-11-23 NOTE — Telephone Encounter (Signed)
Patient called stated that she was having some swelling, wanted to schedule an appointment. Called transferred to front desk staff [phone] [facility]) to schedule appointment.

## 2021-11-28 ENCOUNTER — Ambulatory Visit: Payer: Medicare Other

## 2021-12-07 ENCOUNTER — Other Ambulatory Visit: Payer: Self-pay | Admitting: Internal Medicine

## 2021-12-07 DIAGNOSIS — E0500 Thyrotoxicosis with diffuse goiter without thyrotoxic crisis or storm: Secondary | ICD-10-CM

## 2021-12-08 ENCOUNTER — Other Ambulatory Visit: Payer: Self-pay | Admitting: Cardiology

## 2021-12-28 ENCOUNTER — Other Ambulatory Visit: Payer: Self-pay | Admitting: Cardiology

## 2021-12-28 ENCOUNTER — Other Ambulatory Visit: Payer: Self-pay | Admitting: Internal Medicine

## 2021-12-28 DIAGNOSIS — R0609 Other forms of dyspnea: Secondary | ICD-10-CM

## 2021-12-28 DIAGNOSIS — Z794 Long term (current) use of insulin: Secondary | ICD-10-CM

## 2021-12-28 DIAGNOSIS — E782 Mixed hyperlipidemia: Secondary | ICD-10-CM

## 2021-12-29 ENCOUNTER — Other Ambulatory Visit: Payer: Self-pay | Admitting: Internal Medicine

## 2021-12-29 DIAGNOSIS — Z794 Long term (current) use of insulin: Secondary | ICD-10-CM

## 2021-12-30 ENCOUNTER — Ambulatory Visit (INDEPENDENT_AMBULATORY_CARE_PROVIDER_SITE_OTHER): Payer: Medicare Other | Admitting: Internal Medicine

## 2021-12-30 ENCOUNTER — Encounter: Payer: Self-pay | Admitting: Internal Medicine

## 2021-12-30 VITALS — BP 110/62 | HR 68 | Ht 64.0 in | Wt 206.4 lb

## 2021-12-30 DIAGNOSIS — E1169 Type 2 diabetes mellitus with other specified complication: Secondary | ICD-10-CM | POA: Diagnosis not present

## 2021-12-30 DIAGNOSIS — Z794 Long term (current) use of insulin: Secondary | ICD-10-CM | POA: Diagnosis not present

## 2021-12-30 DIAGNOSIS — E785 Hyperlipidemia, unspecified: Secondary | ICD-10-CM

## 2021-12-30 DIAGNOSIS — E1165 Type 2 diabetes mellitus with hyperglycemia: Secondary | ICD-10-CM

## 2021-12-30 DIAGNOSIS — E0500 Thyrotoxicosis with diffuse goiter without thyrotoxic crisis or storm: Secondary | ICD-10-CM

## 2021-12-30 LAB — POCT GLYCOSYLATED HEMOGLOBIN (HGB A1C): Hemoglobin A1C: 6.7 % — AB (ref 4.0–5.6)

## 2021-12-30 NOTE — Progress Notes (Signed)
Patient ID: [name], female   DOB: [birth year], 76 y.o.   MRN: [id number]  HPI: Donna Booker is a 76 y.o.-year-old female, presenting for f/u for DM2, dx in 1987, insulin-dependent since 2006, uncontrolled, with complications (PN, stage 2-3 CKD)and Graves ds. Last visit 4 months ago.  Interim history: No increased urination, blurry vision, nausea, chest pain.  She lost 7 lbs since last OV, after starting back on Trulicity.  Graves ds  -Diagnosed during an admission for A. fib with RVR in 05/2017.  Reviewed her TFTs: Lab Results  Component Value Date   TSH 2.40 04/25/2021   TSH 2.90 10/15/2020   TSH 1.37 07/14/2020   TSH 3.94 06/28/2020   TSH 4.90 (H) 04/26/2020   TSH 5.06 (H) 02/26/2020   TSH 3.03 09/19/2019   TSH 0.46 06/05/2019   TSH <0.01 (L) 04/22/2019   TSH <0.01 (L) 03/03/2019   Lab Results  Component Value Date   FREET4 0.67 04/25/2021   FREET4 0.81 10/15/2020   FREET4 1.30 07/14/2020   FREET4 0.79 06/28/2020   FREET4 0.71 04/26/2020   FREET4 0.67 02/26/2020   FREET4 0.61 09/19/2019   FREET4 0.63 06/05/2019   FREET4 0.76 04/22/2019   FREET4 1.98 (H) 03/03/2019   Lab Results  Component Value Date   T3FREE 3.3 04/25/2021   T3FREE 3.2 10/15/2020   T3FREE 3.5 07/14/2020   T3FREE 3.4 06/28/2020   T3FREE 3.0 04/26/2020   T3FREE 3.0 02/26/2020   T3FREE 3.1 09/19/2019   T3FREE 3.2 06/05/2019   T3FREE 3.3 04/22/2019   T3FREE 4.4 (H) 03/03/2019   Graves' antibodies were elevated Lab Results  Component Value Date   TSI 8.02 (H) 04/16/2017   She was started on methimazole 10 mg 2x a day and atenolol 50 mg 3x a day.Her TFTs were significantly improved >> we decreased the methimazole to 5 mg 2X a day, but then on 5 mg daily (? When changed - by PCP? -Patient cannot remember exactly when she started her prescription for methimazole only mentioned to take it once a day...).   12/2017: We stopped methimazole as the TSH was 10.   02/2019: Patient finally  return for labs: TSH was suppressed completely 03/2019: We restarted methimazole 5 mg twice a day  03/2020: We decreased the methimazole dose to 5 mg daily 04/2020: I advised her to decrease the methimazole dose to 2.5 mg daily >> SHE FORGOT, so she continued on 5 mg daily 06/2020: Decreased MMI to 2.5 mg daily  She continues Toprol-XL.  Pt denies: - feeling nodules in neck - hoarseness - dysphagia - choking  No FH of thyroid disease or thyroid cancer. No h/o radiation tx to head or neck. No herbal supplements. No Biotin use. No recent steroids use.   DM2: Reviewed HbA1c levels: Lab Results  Component Value Date   HGBA1C 9.0 (A) 08/30/2021   HGBA1C 7.4 (A) 04/25/2021   HGBA1C 9.5 (A) 10/15/2020   HGBA1C 6.1 (A) 06/28/2020   HGBA1C 6.8 (H) 02/26/2020   HGBA1C 8.1 (A) 11/25/2019   HGBA1C 8.6 (A) 09/19/2019   HGBA1C 7.3 (A) 06/05/2019   HGBA1C 8.8 (H) 02/26/2019   HGBA1C 13.0 04/26/2018   HGBA1C 14.1 (H) 03/19/2018   HGBA1C 13.1 (A) 12/28/2017   HGBA1C 9.8 (H) 07/14/2017   HGBA1C 8.0 02/01/2016   HGBA1C 8.3 (H) 10/20/2015   HGBA1C 8.4 07/29/2015   HGBA1C 10.0 04/27/2015   HGBA1C 9.5 (H) 12/29/2014   HGBA1C 9.1 (H) 09/01/2014  10/15/2020:  HbA1c  calculated from fructosamine is much better, at 7.45%, correlating better with her blood sugars at home.   She is on: - Glipizide 10 mg 2x a day before meals >> 10 mg in a.m. and 5 mg in p.m.  - Levemir 50 >> 40 >> Lantus 35 units at night  - Trulicity 1.5 mg weekly - started 12/2018 >> 3 mg weekly  She was previously on a VGo patch pump.   She also tried Bydureon (02/2013-10/2014) >> made her hungry, did not lower sugars  She checks sugars once a day: - am: 91-151, 202 >> 74-98, 115, 120 >> 79-117 >> 67, 79-110, 120 - 2h after b'fast: 99-164 >> 144 >> 84, 95-143, 154 >> n/c >> 127, 130 - before lunch: n/c >> 136, 158 >> n/c >> 137, 140 >> 150-200 >> n/c - 2h after lunch: 1n/c >> 136, 140 >> 340 >> n/c - before dinner:  134-144 >> 108, 191 >> n/c >> 110 >> 125-140 >> n/c - 2h after dinner: n/c >> 175-201 >> n/c >> 194  >> 205 >> n/c - bedtime: 192, 265 >> 130-137 >> ? >> 207 >> n/c - nighttime: n/c Lowest sugar was 48 >> 81 >> 67; she has hypoglycemia awareness in the 70s. Highest sugar was 340 (cake) >> 205 >> 270 (of Trulicity) >> 147.  Glucometer: AccuChek  -+ CKD, last BUN/creatinine:  Lab Results  Component Value Date   BUN 28 (H) 05/02/2021   CREATININE 1.46 (H) 05/02/2021  04/25/2018: Glucose 269, BUN/creatinine 18/1.05 Lab Results  Component Value Date   MICRALBCREAT 35.4 (H) 02/28/2021   MICRALBCREAT 110.0 (H) 09/19/2019   MICRALBCREAT 192.6 (H) 06/05/2019  Previously on olmesartan, now off (?)  -+ HL; last set of lipids: Lab Results  Component Value Date   CHOL 138 02/28/2021   HDL 45.00 02/28/2021   LDLCALC 73 02/28/2021   LDLDIRECT 98.0 03/19/2018   TRIG 100.0 02/28/2021   CHOLHDL 3 02/28/2021  On Crestor 10.  - last eye exam was in 2023: No DR reportedly.   - no numbness and tingling in her feet.  Latest foot exam was in 02/2021.  She previously had hyperkalemia, which we addressed in the past.  This resolved. - Possibly 2/2 ACE inhibitor, potassium supplementation +/-beta-blocker - After an initial K of 7.5 in 07/2017 >> K levels normalized after stopping ACEI and K supplements Lab Results  Component Value Date   K 4.1 05/02/2021   K 4.2 02/28/2021   K 3.8 02/26/2020   K 4.9 02/26/2019   K 4.1 04/25/2018  Of note, a cosyntropin stimulation test was normal and also aldosterone and renin were normal while in the hospital.   She had AKI on admission then.  Her son died in 16-Jul-2017-he had severe heart failure.  ROS: + See HPI  I reviewed pt's medications, allergies, PMH, social hx, family hx, and changes were documented in the history of present illness. Otherwise, unchanged from my initial visit note.  Past Medical History:  Diagnosis Date   Afib (HCC)     Diabetes mellitus    Graves disease    Heart failure (HCC)    Hypertension    Hyperthyroidism    Past Surgical History:  Procedure Laterality Date   BREAST SURGERY     Social History   Social History   Marital Status: Married    Spouse Name: N/A   Number of Children: 1   Occupational History   Customer svc rep  Social History Main Topics   Smoking status: Never Smoker    Smokeless tobacco: Not on file   Alcohol Use: No   Drug Use: No   Current Outpatient Medications on File Prior to Visit  Medication Sig Dispense Refill   Abatacept (ORENCIA [place]) Inject into the skin every 30 (thirty) days.     albuterol (VENTOLIN HFA) 108 (90 Base) MCG/ACT inhaler Inhale 2 puffs into the lungs every 6 (six) hours as needed for wheezing or shortness of breath. 8 g 2   allopurinol (ZYLOPRIM) 300 MG tablet TAKE 1 TABLET BY MOUTH EVERY DAY 90 tablet 1   amLODipine (NORVASC) 10 MG tablet TAKE 1 TABLET BY MOUTH EVERY DAY 90 tablet 1   apixaban (ELIQUIS) 5 MG TABS tablet Take 1 tablet (5 mg total) by mouth 2 (two) times daily. 180 tablet 3   Dulaglutide (TRULICITY) 3 MG/0.5ML SOPN Inject 3 mg into the skin once a week. 2 mL 11   esomeprazole (NEXIUM) 40 MG capsule Take 40 mg by mouth daily before breakfast.     furosemide (LASIX) 20 MG tablet TAKE 2 TABLETS BY MOUTH TWICE A DAY 360 tablet 1   gabapentin (NEURONTIN) 300 MG capsule Take 1-2 capsules (300-600 mg total) by mouth 3 (three) times daily. 150 capsule 5   glipiZIDE (GLUCOTROL) 5 MG tablet TAKE 2 TABLETS (10 MG TOTAL) BY MOUTH IN AM AND 5 MG BEFORE SUPPER. 360 tablet 1   hydrALAZINE (APRESOLINE) 25 MG tablet TAKE 1 TABLET BY MOUTH THREE TIMES A DAY 270 tablet 0   HYDROcodone-acetaminophen (NORCO/VICODIN) 5-325 MG tablet Take 1 tablet by mouth 3 (three) times daily as needed for moderate pain. 30 tablet 0   insulin glargine (LANTUS SOLOSTAR) 100 UNIT/ML Solostar Pen Inject 35 Units into the skin at bedtime. 30 mL 3   insulin glargine (LANTUS  SOLOSTAR) 100 UNIT/ML Solostar Pen Inject 35 Units into the skin daily. 30 mL 1   isosorbide mononitrate (IMDUR) 30 MG 24 hr tablet TAKE 1 TABLET BY MOUTH EVERY DAY 90 tablet 1   methimazole (TAPAZOLE) 5 MG tablet TAKE 1/2 TABLET BY MOUTH DAILY 45 tablet 1   metoprolol succinate (TOPROL-XL) 100 MG 24 hr tablet TAKE 1 TABLET BY MOUTH DAILY. TAKE WITH OR IMMEDIATELY FOLLOWING A MEAL. 90 tablet 2   ondansetron (ZOFRAN) 4 MG tablet Take 1 tablet (4 mg total) by mouth every 8 (eight) hours as needed for nausea or vomiting. 20 tablet 0   rosuvastatin (CRESTOR) 10 MG tablet TAKE 1 TABLET BY MOUTH EVERY DAY 90 tablet 1   tiZANidine (ZANAFLEX) 2 MG tablet TAKE 1 TABLET BY MOUTH EVERY 6 HOURS AS NEEDED FOR MUSCLE SPASMS. 60 tablet 1   VOLTAREN 1 % GEL Apply 2 g topically 4 (four) times daily. (Patient taking differently: Apply 2 g topically as needed.) 100 g 3   No current facility-administered medications on file prior to visit.   Allergies  Allergen Reactions   Ibuprofen    Tramadol Nausea And Vomiting    FH - see HPI  PE: BP 110/62 (BP Location: Right Arm, Patient Position: Sitting, Cuff Size: Normal)   Pulse 68   Ht 5\' 4"  (1.626 m)   Wt 206 lb 6.4 oz (93.6 kg)   SpO2 98%   BMI 35.43 kg/m  Wt Readings from Last 3 Encounters:  12/30/21 206 lb 6.4 oz (93.6 kg)  08/30/21 214 lb 12.8 oz (97.4 kg)  08/15/21 213 lb 6.4 oz (96.8 kg)   Constitutional: overweight,  in NAD.  She walks with a cane. Eyes:  EOMI, no exophthalmos ENT: no neck masses, no cervical lymphadenopathy Cardiovascular: RRR, No MRG Respiratory: CTA B Musculoskeletal: no deformities Skin:no rashes Neurological: no tremor with outstretched hands  ASSESSMENT: 1. 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  Graves' disease  3.  HL  We previously also addressed her hyperkalemia: - in 07/2017, she had a critical value of potassium of 7.5.  She was admitted with high potassium since  then >> Lasix, potassium supplementation, and lisinopril were stopped. - A cosyntropin stimulation test performed in the hospital was normal, ruling out primary adrenal insufficiency as a cause for hyperkalemia - Aldosterone and renin levels were normal during her last hospitalization - On her medication list, I do not see any possible culprits,  other than possibly propranolol - I suggested a referral to nephrology  if her potassium continues to remain elevated after stopping ACE inhibitor and potassium supplementation - Potassium normalized afterwards  PLAN:  1. Patient with longstanding, previously uncontrolled type 2 diabetes on oral antidiabetic regimen with sulfonylurea and also injectable regimen with daily long-acting insulin and weekly GLP-1 receptor agonist, restarted at last visit.  At that time, sugars were at goal in the morning but they were increasing especially after dinner.  She was off Trulicity for 1 month as she was not able to get it through As she needed any application.  We gave her a sample of Ozempic to be used weekly until she could obtain Trulicity through the patient assistance program. -At this visit, she is now back on Trulicity and her sugars are much better and she also lost weight.  In the morning, she may have slightly lower blood sugars, even 67 this morning.  We discussed about backing off the Lantus slightly.  Otherwise, we can continue the current regimen for now, but I would like to decrease the dose of her glipizide at next visit, if sugars continue to improve. - I suggested to:  Patient Instructions  Please continue: - Glipizide 10 mg before b'fast and 5 mg before dinner - Trulicity 3 mg weekly  Decrease: - Lantus 30 units at night  Also, continue: - Methimazole 2.5 mg daily  Please return in 4 months with your sugar log.   - we checked her HbA1c: 6.7% (lower) - advised to check sugars at different times of the day - 1-2x a day, rotating check  times - advised for yearly eye exams >> she is UTD - return to clinic in 4 months  2.  Graves' disease -Her hyperthyroidism was diagnosed after she was admitted with A-fib with RVR and acute pulmonary edema in 2019 -We diagnosed Graves' disease based on elevated TSI's and clinical course -We started methimazole initially, however, we then had to stop this completely after TSH increased to 10 and then restarted it in 03/2019 at 5 mg twice a day as the TSH returned suppressed.  Afterwards, we were able to decrease the dose down to 2.5 mg daily.  She continues on this dose now. -Latest TSH was normal: Lab Results  Component Value Date   TSH 2.40 04/25/2021  -At today's visit, she denies hyperthyroid symptoms including weight loss, palpitations, anxiety, heat intolerance - we will repeat her TFTs today  3. HL -Reviewed latest lipid panel from 02/2021: LDL only slightly higher than target, the rest the fractions at goal: Lab Results  Component Value Date   CHOL 138 02/28/2021  HDL 45.00 02/28/2021   LDLCALC 73 02/28/2021   LDLDIRECT 98.0 03/19/2018   TRIG 100.0 02/28/2021   CHOLHDL 3 02/28/2021  -She continues on Crestor 10 mg daily without side effects  Addendum: Pt. did not stop at the lab ...  Carlus Pavlov, MD PhD [facility] Endocrin[facility]

## 2021-12-30 NOTE — Patient Instructions (Addendum)
Please continue: - Glipizide 10 mg before b'fast and 5 mg before dinner - Trulicity 3 mg weekly  Decrease: - Lantus 30 units at night  Also, continue: - Methimazole 2.5 mg daily  Please return in 4 months with your sugar log.

## 2022-01-18 ENCOUNTER — Telehealth: Payer: Self-pay

## 2022-01-18 NOTE — Telephone Encounter (Signed)
Pt called to confirm med change. Attempted to contact pt but unable to leave voicemail. Mailbox not set up on alternative number.

## 2022-01-19 NOTE — Telephone Encounter (Signed)
Pt called back to confirm medication change. Confirmed Dr Cruzita Lederer approved insulin change from Lantus to [employer] per insurance preference. Pt verbalized understanding. Pt also advised of voicemail issues.

## 2022-01-30 ENCOUNTER — Other Ambulatory Visit: Payer: Self-pay | Admitting: Cardiology

## 2022-01-30 DIAGNOSIS — I10 Essential (primary) hypertension: Secondary | ICD-10-CM

## 2022-02-03 ENCOUNTER — Other Ambulatory Visit (HOSPITAL_COMMUNITY): Payer: Self-pay

## 2022-02-03 ENCOUNTER — Ambulatory Visit: Payer: Medicare Other | Admitting: Cardiology

## 2022-02-03 ENCOUNTER — Encounter: Payer: Self-pay | Admitting: Cardiology

## 2022-02-03 VITALS — BP 136/70 | HR 86 | Resp 16 | Ht 64.0 in | Wt 201.0 lb

## 2022-02-03 DIAGNOSIS — I10 Essential (primary) hypertension: Secondary | ICD-10-CM

## 2022-02-03 DIAGNOSIS — R0609 Other forms of dyspnea: Secondary | ICD-10-CM

## 2022-02-03 DIAGNOSIS — Z8639 Personal history of other endocrine, nutritional and metabolic disease: Secondary | ICD-10-CM

## 2022-02-03 DIAGNOSIS — I480 Paroxysmal atrial fibrillation: Secondary | ICD-10-CM

## 2022-02-03 NOTE — Progress Notes (Signed)
Follow up visit  Subjective:   Donna Booker, female    DOB: 1945/02/06, 77 y.o.   MRN: [id number]   Chief Complaint  Patient presents with   Atrial Fibrillation   Follow-up    6 months     HPI  77 year old African American female withf Graves' disease with hyperthyroidism, paroxysmal atrial fibrillation, mild PH, HFpEF (secondary to Afib), uncontrolled type II diabetes mellitus with diabetic polyneuropathy, CKD stage III.  Recent exertional dyspnea symptoms with minimal activity., No chest pain. Blood pressure well controlled.     Current Outpatient Medications:    Abatacept (ORENCIA [place]), Inject into the skin every 30 (thirty) days., Disp: , Rfl:    albuterol (VENTOLIN HFA) 108 (90 Base) MCG/ACT inhaler, Inhale 2 puffs into the lungs every 6 (six) hours as needed for wheezing or shortness of breath., Disp: 8 g, Rfl: 2   allopurinol (ZYLOPRIM) 300 MG tablet, TAKE 1 TABLET BY MOUTH EVERY DAY, Disp: 90 tablet, Rfl: 1   amLODipine (NORVASC) 10 MG tablet, TAKE 1 TABLET BY MOUTH EVERY DAY, Disp: 90 tablet, Rfl: 1   apixaban (ELIQUIS) 5 MG TABS tablet, Take 1 tablet (5 mg total) by mouth 2 (two) times daily., Disp: 180 tablet, Rfl: 3   Dulaglutide (TRULICITY) 3 JI/9.6VE SOPN, Inject 3 mg into the skin once a week., Disp: 2 mL, Rfl: 11   esomeprazole (NEXIUM) 40 MG capsule, Take 40 mg by mouth daily before breakfast., Disp: , Rfl:    furosemide (LASIX) 20 MG tablet, TAKE 2 TABLETS BY MOUTH TWICE A DAY, Disp: 360 tablet, Rfl: 1   gabapentin (NEURONTIN) 300 MG capsule, Take 1-2 capsules (300-600 mg total) by mouth 3 (three) times daily., Disp: 150 capsule, Rfl: 5   glipiZIDE (GLUCOTROL) 5 MG tablet, TAKE 2 TABLETS (10 MG TOTAL) BY MOUTH IN AM AND 5 MG BEFORE SUPPER., Disp: 360 tablet, Rfl: 1   hydrALAZINE (APRESOLINE) 25 MG tablet, TAKE 1 TABLET BY MOUTH THREE TIMES A DAY, Disp: 270 tablet, Rfl: 0   HYDROcodone-acetaminophen (NORCO/VICODIN) 5-325 MG tablet, Take 1 tablet by mouth 3  (three) times daily as needed for moderate pain., Disp: 30 tablet, Rfl: 0   Insulin Glargine (BASAGLAR [place]WIKPEN) 100 UNIT/ML, Inject 35 Units into the skin daily., Disp: 30 mL, Rfl: 1   isosorbide mononitrate (IMDUR) 30 MG 24 hr tablet, TAKE 1 TABLET BY MOUTH EVERY DAY, Disp: 90 tablet, Rfl: 1   methimazole (TAPAZOLE) 5 MG tablet, TAKE 1/2 TABLET BY MOUTH DAILY, Disp: 45 tablet, Rfl: 1   metoprolol succinate (TOPROL-XL) 100 MG 24 hr tablet, TAKE 1 TABLET BY MOUTH DAILY. TAKE WITH OR IMMEDIATELY FOLLOWING A MEAL., Disp: 90 tablet, Rfl: 2   ondansetron (ZOFRAN) 4 MG tablet, Take 1 tablet (4 mg total) by mouth every 8 (eight) hours as needed for nausea or vomiting., Disp: 20 tablet, Rfl: 0   rosuvastatin (CRESTOR) 10 MG tablet, TAKE 1 TABLET BY MOUTH EVERY DAY, Disp: 90 tablet, Rfl: 1   tiZANidine (ZANAFLEX) 2 MG tablet, TAKE 1 TABLET BY MOUTH EVERY 6 HOURS AS NEEDED FOR MUSCLE SPASMS., Disp: 60 tablet, Rfl: 1   VOLTAREN 1 % GEL, Apply 2 g topically 4 (four) times daily. (Patient taking differently: Apply 2 g topically as needed.), Disp: 100 g, Rfl: 3  Cardiovascular studies:  EKG 02/03/2022: Sinus rhythm 78 bpm Low voltage in precordial leads Old anteroseptal infarct  Lexiscan Nuclear stress test 01/18/2020: Nondiagnostic ECG stress. The heart rate response was consistent with Lexiscan.  Myocardial  perfusion is normal. Overall LV systolic function is normal without regional wall motion abnormalities. Stress LV EF: 64%.  No previous exam available for comparison. Low risk.   Echocardiogram 12/06/2020: Left ventricle cavity is normal in size. Moderate concentric hypertrophy of the left ventricle. Normal global wall motion. Normal LV systolic function with EF 62%. Doppler evidence of grade I (impaired) diastolic dysfunction, normal LAP.  Trace MR, trace TR. No evidence of pulmonary hypertension. Compared to previous study in 2019, atrial dilatation, mild PH now absent.  Renal artery duplex ([facility]) 05/23/2017: No renal artery stenosis  Recent labs: 12/30/2021: HbA1C 6.7%  05/02/2021: Glucose 190, BUN/Cr 28/1.46. EGFR 35. Na/K 137/4.1. Rest of the CMP normal H/H 10/33. MCV 69. Platelets 383 HbA1C 7.4% Chol 133, TG 100, HDL 45, LDL 73 TSH 2.4 normal  Review of Systems  Constitutional: Negative for chills and fever.  Cardiovascular:  Positive for dyspnea on exertion. Negative for chest pain, leg swelling, palpitations and syncope.         Vitals:   02/03/22 1059  BP: 136/70  Pulse: 86  Resp: 16  SpO2: 98%     Body mass index is 34.5 kg/m. Filed Weights   02/03/22 1059  Weight: 201 lb (91.2 kg)     Objective:   Physical Exam Vitals and nursing note reviewed.  Constitutional:      General: She is not in acute distress. Neck:     Vascular: No JVD.  Cardiovascular:     Rate and Rhythm: Normal rate and regular rhythm.     Heart sounds: Normal heart sounds. No murmur heard. Pulmonary:     Effort: Pulmonary effort is normal.     Breath sounds: Normal breath sounds. No wheezing or rales.  Musculoskeletal:     Right lower leg: No tenderness. No edema.     Left lower leg: No tenderness. No edema.          Assessment & Recommendations:    77 year old African American female withf Graves' disease with hyperthyroidism, paroxysmal atrial fibrillation, mild PH, HFpEF (secondary to Afib), uncontrolled type II diabetes mellitus with diabetic polyneuropathy, CKD stage III.  Exertional dyspnea: New. Will repeat echocardiogram, exercise nuclear stress test, may switch to Lexiscan if patient cannot walk long enough) Also check calcium score.  PAF: Maintaining sinus rhythm. CHA2DS2VASc score 4, annual stroke risk 5%. Continue anticaogualtion with eliquis 5 mg bid.   Hypertension: Well controlled. She should not be on ACEi/ARB/aldosterone antagonist due to pevious episodes of hyperkalemia.   Grave's disease, DM:  F/u w/endocrinology   F/u in 4  weeks    Nigel Mormon, MD Pager: [phone] Office: [phone]

## 2022-02-10 ENCOUNTER — Telehealth: Payer: Self-pay

## 2022-02-10 ENCOUNTER — Other Ambulatory Visit: Payer: Medicare Other

## 2022-02-10 NOTE — Telephone Encounter (Signed)
Left message for patient to call back to schedule Medicare Annual Wellness Visit   Last AWV  02/28/21  Please schedule at anytime with [facility] if patient calls the office back.    30 Minutes appointment   Any questions, please call me at [phone]

## 2022-02-18 LAB — PRO B NATRIURETIC PEPTIDE: NT-Pro BNP: 95 pg/mL (ref 0–301)

## 2022-02-20 ENCOUNTER — Ambulatory Visit: Payer: Medicare Other

## 2022-02-20 DIAGNOSIS — R0609 Other forms of dyspnea: Secondary | ICD-10-CM

## 2022-03-01 NOTE — Progress Notes (Signed)
Follow up visit  Subjective:   Donna Booker, female    DOB: 1945-10-12, 77 y.o.   MRN: [id number]   Chief Complaint  Patient presents with   Atrial Fibrillation   Dyspnea on exertion   Follow-up     HPI  77 year old African American female withf Graves' disease with hyperthyroidism, paroxysmal atrial fibrillation, mild PH, HFpEF (secondary to Afib), uncontrolled type II diabetes mellitus with diabetic polyneuropathy, CKD stage III.  Patient continues to have exertional dyspnea symptoms. Reviewed recent test results with the patient, details below. She has not undergone echocardiogram. NtProBNP was normal. Blood pressure elevated today.    Current Outpatient Medications:    Abatacept (ORENCIA Arial), Inject into the skin every 30 (thirty) days., Disp: , Rfl:    albuterol (VENTOLIN HFA) 108 (90 Base) MCG/ACT inhaler, Inhale 2 puffs into the lungs every 6 (six) hours as needed for wheezing or shortness of breath., Disp: 8 g, Rfl: 2   allopurinol (ZYLOPRIM) 300 MG tablet, TAKE 1 TABLET BY MOUTH EVERY DAY, Disp: 90 tablet, Rfl: 1   amLODipine (NORVASC) 10 MG tablet, TAKE 1 TABLET BY MOUTH EVERY DAY, Disp: 90 tablet, Rfl: 1   apixaban (ELIQUIS) 5 MG TABS tablet, Take 1 tablet (5 mg total) by mouth 2 (two) times daily., Disp: 180 tablet, Rfl: 3   Dulaglutide (TRULICITY) 3 0000000 SOPN, Inject 3 mg into the skin once a week., Disp: 2 mL, Rfl: 11   esomeprazole (NEXIUM) 40 MG capsule, Take 40 mg by mouth daily before breakfast., Disp: , Rfl:    furosemide (LASIX) 20 MG tablet, TAKE 2 TABLETS BY MOUTH TWICE A DAY, Disp: 360 tablet, Rfl: 1   gabapentin (NEURONTIN) 300 MG capsule, Take 1-2 capsules (300-600 mg total) by mouth 3 (three) times daily., Disp: 150 capsule, Rfl: 5   glipiZIDE (GLUCOTROL) 5 MG tablet, TAKE 2 TABLETS (10 MG TOTAL) BY MOUTH IN AM AND 5 MG BEFORE SUPPER., Disp: 360 tablet, Rfl: 1   hydrALAZINE (APRESOLINE) 25 MG tablet, TAKE 1 TABLET BY MOUTH THREE TIMES A DAY, Disp:  270 tablet, Rfl: 0   HYDROcodone-acetaminophen (NORCO/VICODIN) 5-325 MG tablet, Take 1 tablet by mouth 3 (three) times daily as needed for moderate pain., Disp: 30 tablet, Rfl: 0   Insulin Glargine (BASAGLAR KWIKPEN) 100 UNIT/ML, Inject 35 Units into the skin daily., Disp: 30 mL, Rfl: 1   isosorbide mononitrate (IMDUR) 30 MG 24 hr tablet, TAKE 1 TABLET BY MOUTH EVERY DAY, Disp: 90 tablet, Rfl: 1   methimazole (TAPAZOLE) 5 MG tablet, TAKE 1/2 TABLET BY MOUTH DAILY, Disp: 45 tablet, Rfl: 1   metoprolol succinate (TOPROL-XL) 100 MG 24 hr tablet, TAKE 1 TABLET BY MOUTH DAILY. TAKE WITH OR IMMEDIATELY FOLLOWING A MEAL., Disp: 90 tablet, Rfl: 2   ondansetron (ZOFRAN) 4 MG tablet, Take 1 tablet (4 mg total) by mouth every 8 (eight) hours as needed for nausea or vomiting., Disp: 20 tablet, Rfl: 0   rosuvastatin (CRESTOR) 10 MG tablet, TAKE 1 TABLET BY MOUTH EVERY DAY, Disp: 90 tablet, Rfl: 1   tiZANidine (ZANAFLEX) 2 MG tablet, TAKE 1 TABLET BY MOUTH EVERY 6 HOURS AS NEEDED FOR MUSCLE SPASMS., Disp: 60 tablet, Rfl: 1   VOLTAREN 1 % GEL, Apply 2 g topically 4 (four) times daily. (Patient taking differently: Apply 2 g topically as needed.), Disp: 100 g, Rfl: 3  Cardiovascular studies:  Lexiscan Tetrofosmin stress test 02/20/2022: Lexiscan nuclear stress test performed using 1-day protocol. Normal myocardial perfusion. Stress LVEF 47%, although  visually appears normal. Low risk study. No significant change compared to previous study on 01/17/2021.   CT cardiac scoring 02/13/2022: - Total Calcium Score: 1143.  -- Left Main: 0.  -- Left Anterior Descending: 641.  -- Circumflex: 381.  -- Right Coronary: 121  -- Posterior Descending Artery: 0   - Cardiovascular:  Normal heart size. No pericardial effusion.  Mild fusiform aneurysmal dilatation of the ascending thoracic aorta measuring up to 4.1 cm   EKG 02/03/2022: Sinus rhythm 78 bpm Low voltage in precordial leads Old anteroseptal  infarct  Echocardiogram 12/06/2020: Left ventricle cavity is normal in size. Moderate concentric hypertrophy of the left ventricle. Normal global wall motion. Normal LV systolic function with EF 62%. Doppler evidence of grade I (impaired) diastolic dysfunction, normal LAP.  Trace MR, trace TR. No evidence of pulmonary hypertension. Compared to previous study in 2019, atrial dilatation, mild PH now absent.  Renal artery duplex ([facility]) 05/23/2017: No renal artery stenosis  Recent labs: 02/17/2022: NT pro BNP 95 normal  12/30/2021: HbA1C 6.7%  05/02/2021: Glucose 190, BUN/Cr 28/1.46. EGFR 35. Na/K 137/4.1. Rest of the CMP normal H/H 10/33. MCV 69. Platelets 383 HbA1C 7.4% Chol 133, TG 100, HDL 45, LDL 73 TSH 2.4 normal  Review of Systems  Constitutional: Negative for chills and fever.  Cardiovascular:  Positive for dyspnea on exertion. Negative for chest pain, leg swelling, palpitations and syncope.         Vitals:   03/03/22 1155  BP: (!) 147/79  Pulse: 80  Resp: 16  SpO2: 97%     Body mass index is 35.09 kg/m. Filed Weights   03/03/22 1155  Weight: 204 lb 6.4 oz (92.7 kg)     Objective:   Physical Exam Vitals and nursing note reviewed.  Constitutional:      General: She is not in acute distress. Neck:     Vascular: No JVD.  Cardiovascular:     Rate and Rhythm: Normal rate and regular rhythm.     Heart sounds: Normal heart sounds. No murmur heard. Pulmonary:     Effort: Pulmonary effort is normal.     Breath sounds: Normal breath sounds. No wheezing or rales.  Musculoskeletal:     Right lower leg: No tenderness. No edema.     Left lower leg: No tenderness. No edema.           Assessment & Recommendations:    77 year old African American female withf Graves' disease with hyperthyroidism, paroxysmal atrial fibrillation, mild PH, HFpEF (secondary to Afib), uncontrolled type II diabetes mellitus with diabetic polyneuropathy, CKD stage  III.  Exertional dyspnea: NTProBNP normal. Echocardiogram pending. Elevated CAC but nuclear stress test without ischemia (02/2022) Suspect dyspnea is due to deconditioning and elevated blood pressure. Increase hydralazine to 50 mg tid.  Elevated CAD: Increase Crestor to 20 mg daily. Check lipid panel in 4 weeks.   PAF: Maintaining sinus rhythm. CHA2DS2VASc score 4, annual stroke risk 5%. Continue anticaogualtion with eliquis 5 mg bid.   Hypertension: Well controlled. She should not be on ACEi/ARB/aldosterone antagonist due to pevious episodes of hyperkalemia.   Grave's disease, DM:  F/u w/endocrinology   F/u in 6 weeks    Nigel Mormon, MD Pager: [phone] Office: [phone]

## 2022-03-03 ENCOUNTER — Ambulatory Visit: Payer: Medicare Other | Admitting: Cardiology

## 2022-03-03 ENCOUNTER — Encounter: Payer: Self-pay | Admitting: Cardiology

## 2022-03-03 VITALS — BP 147/79 | HR 80 | Resp 16 | Ht 64.0 in | Wt 204.4 lb

## 2022-03-03 DIAGNOSIS — I10 Essential (primary) hypertension: Secondary | ICD-10-CM

## 2022-03-03 DIAGNOSIS — R931 Abnormal findings on diagnostic imaging of heart and coronary circulation: Secondary | ICD-10-CM

## 2022-03-03 DIAGNOSIS — R0609 Other forms of dyspnea: Secondary | ICD-10-CM

## 2022-03-03 DIAGNOSIS — E782 Mixed hyperlipidemia: Secondary | ICD-10-CM

## 2022-03-03 MED ORDER — HYDRALAZINE HCL 25 MG PO TABS: 50.0000 mg | ORAL_TABLET | Freq: Three times a day (TID) | ORAL | 0 refills | Status: DC

## 2022-03-06 ENCOUNTER — Other Ambulatory Visit: Payer: Self-pay | Admitting: Cardiology

## 2022-03-20 ENCOUNTER — Other Ambulatory Visit: Payer: Self-pay | Admitting: Cardiology

## 2022-03-20 DIAGNOSIS — R600 Localized edema: Secondary | ICD-10-CM

## 2022-04-01 LAB — CBC
Hematocrit: 36.8 % (ref 34.0–46.6)
Hemoglobin: 11.1 g/dL (ref 11.1–15.9)
MCH: 21.4 pg — ABNORMAL LOW (ref 26.6–33.0)
MCHC: 30.2 g/dL — ABNORMAL LOW (ref 31.5–35.7)
MCV: 71 fL — ABNORMAL LOW (ref 79–97)
Platelets: 376 10*3/uL (ref 150–450)
RBC: 5.18 x10E6/uL (ref 3.77–5.28)
RDW: 18.9 % — ABNORMAL HIGH (ref 11.7–15.4)
WBC: 12.8 10*3/uL — ABNORMAL HIGH (ref 3.4–10.8)

## 2022-04-01 LAB — LIPID PANEL
Chol/HDL Ratio: 3.3 ratio (ref 0.0–4.4)
Cholesterol, Total: 133 mg/dL (ref 100–199)
HDL: 40 mg/dL (ref >39)
LDL Chol Calc (NIH): 73 mg/dL (ref 0–99)
Triglycerides: 108 mg/dL (ref 0–149)
VLDL Cholesterol Cal: 20 mg/dL (ref 5–40)

## 2022-04-01 LAB — BASIC METABOLIC PANEL
BUN/Creatinine Ratio: 14 (ref 12–28)
BUN: 22 mg/dL (ref 8–27)
CO2: 23 mmol/L (ref 20–29)
Calcium: 9.8 mg/dL (ref 8.7–10.3)
Chloride: 98 mmol/L (ref 96–106)
Creatinine, Ser: 1.6 mg/dL — ABNORMAL HIGH (ref 0.57–1.00)
Glucose: 83 mg/dL (ref 70–99)
Potassium: 4.6 mmol/L (ref 3.5–5.2)
Sodium: 142 mmol/L (ref 134–144)
eGFR: 34 mL/min/{1.73_m2} — ABNORMAL LOW (ref >59)

## 2022-04-03 ENCOUNTER — Other Ambulatory Visit: Payer: Self-pay | Admitting: Internal Medicine

## 2022-04-03 ENCOUNTER — Other Ambulatory Visit: Payer: Self-pay | Admitting: Cardiology

## 2022-04-03 DIAGNOSIS — E782 Mixed hyperlipidemia: Secondary | ICD-10-CM

## 2022-04-06 ENCOUNTER — Other Ambulatory Visit: Payer: Self-pay | Admitting: Internal Medicine

## 2022-04-14 ENCOUNTER — Ambulatory Visit: Payer: Medicare Other | Admitting: Cardiology

## 2022-04-14 ENCOUNTER — Ambulatory Visit: Payer: Medicare Other | Admitting: Internal Medicine

## 2022-04-19 ENCOUNTER — Other Ambulatory Visit: Payer: Self-pay

## 2022-04-19 DIAGNOSIS — Z794 Long term (current) use of insulin: Secondary | ICD-10-CM

## 2022-04-19 MED ORDER — BASAGLAR KWIKPEN 100 UNIT/ML ~~LOC~~ SOPN: 35.0000 [IU] | PEN_INJECTOR | Freq: Every day | SUBCUTANEOUS | 1 refills | Status: DC

## 2022-04-20 ENCOUNTER — Ambulatory Visit (INDEPENDENT_AMBULATORY_CARE_PROVIDER_SITE_OTHER): Payer: Medicare Other | Admitting: Internal Medicine

## 2022-04-20 ENCOUNTER — Encounter: Payer: Self-pay | Admitting: Internal Medicine

## 2022-04-20 VITALS — BP 120/80 | HR 71 | Temp 98.1°F | Ht 64.0 in | Wt 191.0 lb

## 2022-04-20 DIAGNOSIS — M549 Dorsalgia, unspecified: Secondary | ICD-10-CM | POA: Diagnosis not present

## 2022-04-20 DIAGNOSIS — I480 Paroxysmal atrial fibrillation: Secondary | ICD-10-CM

## 2022-04-20 DIAGNOSIS — E1169 Type 2 diabetes mellitus with other specified complication: Secondary | ICD-10-CM | POA: Diagnosis not present

## 2022-04-20 DIAGNOSIS — I10 Essential (primary) hypertension: Secondary | ICD-10-CM

## 2022-04-20 DIAGNOSIS — E785 Hyperlipidemia, unspecified: Secondary | ICD-10-CM

## 2022-04-20 MED ORDER — GABAPENTIN 300 MG PO CAPS: 300.0000 mg | ORAL_CAPSULE | Freq: Three times a day (TID) | ORAL | 5 refills | Status: DC

## 2022-04-20 NOTE — Assessment & Plan Note (Signed)
Taking eliquis 5 mg BID and metoprolol. Sounds regular today but EKG not done.

## 2022-04-20 NOTE — Progress Notes (Signed)
   Subjective:   Patient ID: Donna Booker, female    DOB: 03-03-1945, 77 y.o.   MRN: [id number]  HPI The patient is a 77 YO female coming in for follow up.  Review of Systems  Constitutional: Negative.   HENT: Negative.    Eyes: Negative.   Respiratory:  Negative for cough, chest tightness and shortness of breath.   Cardiovascular:  Negative for chest pain, palpitations and leg swelling.  Gastrointestinal:  Negative for abdominal distention, abdominal pain, constipation, diarrhea, nausea and vomiting.  Musculoskeletal:  Positive for arthralgias and back pain.  Skin: Negative.   Neurological: Negative.   Psychiatric/Behavioral: Negative.      Objective:  Physical Exam Constitutional:      Appearance: She is well-developed.  HENT:     Head: Normocephalic and atraumatic.  Cardiovascular:     Rate and Rhythm: Normal rate and regular rhythm.  Pulmonary:     Effort: Pulmonary effort is normal. No respiratory distress.     Breath sounds: Normal breath sounds. No wheezing or rales.  Abdominal:     General: Bowel sounds are normal. There is no distension.     Palpations: Abdomen is soft.     Tenderness: There is no abdominal tenderness. There is no rebound.  Musculoskeletal:        General: Tenderness present.     Cervical back: Normal range of motion.  Skin:    General: Skin is warm and dry.  Neurological:     Mental Status: She is alert and oriented to person, place, and time.     Coordination: Coordination normal.     Vitals:   04/20/22 1040  BP: 120/80  Pulse: 71  Temp: 98.1 F (36.7 C)  TempSrc: Oral  SpO2: 95%  Weight: 191 lb (86.6 kg)  Height: 5\' 4"  (1.626 m)    Assessment & Plan:

## 2022-04-20 NOTE — Assessment & Plan Note (Signed)
Recent lipid panel at goal taking crestor 10 mg daily. Continue daily.

## 2022-04-20 NOTE — Assessment & Plan Note (Signed)
BP at goal on hydralazine 25 mg TID and lasix 40 mg BID and imdur 30 mg daily and metoprolol 100 mg daily and amlodipine 10 mg daily. Recent labs reviewed and up to date without need to change.

## 2022-04-20 NOTE — Assessment & Plan Note (Signed)
Taking gabapentin 300-600 mg TID for back pain/neuropathy.

## 2022-04-24 ENCOUNTER — Ambulatory Visit: Payer: Medicare Other | Admitting: Cardiology

## 2022-04-24 ENCOUNTER — Encounter: Payer: Self-pay | Admitting: Cardiology

## 2022-04-24 VITALS — BP 138/69 | HR 67 | Ht 64.0 in | Wt 197.0 lb

## 2022-04-24 DIAGNOSIS — E782 Mixed hyperlipidemia: Secondary | ICD-10-CM

## 2022-04-24 DIAGNOSIS — R931 Abnormal findings on diagnostic imaging of heart and coronary circulation: Secondary | ICD-10-CM

## 2022-04-24 DIAGNOSIS — I480 Paroxysmal atrial fibrillation: Secondary | ICD-10-CM

## 2022-04-24 NOTE — Progress Notes (Signed)
Follow up visit  Subjective:   Donna Booker, female    DOB: [birth year], 77 y.o.   MRN: [id number]   Chief Complaint  Patient presents with   Atrial Fibrillation   Follow-up    6 week     HPI  77 year old African American female withf Graves' disease with hyperthyroidism, paroxysmal atrial fibrillation, mild PH, HFpEF (secondary to Afib), uncontrolled type II diabetes mellitus with diabetic polyneuropathy, CKD stage III.  Patient recently had a viral illness, but otherwise doing well.  Dyspnea has improved.  Blood pressure is better controlled.  Reviewed recent test results with the patient, details below.     Current Outpatient Medications:    Abatacept (ORENCIA [place]), Inject into the skin every 30 (thirty) days., Disp: , Rfl:    albuterol (VENTOLIN HFA) 108 (90 Base) MCG/ACT inhaler, Inhale 2 puffs into the lungs every 6 (six) hours as needed for wheezing or shortness of breath., Disp: 8 g, Rfl: 2   allopurinol (ZYLOPRIM) 300 MG tablet, TAKE 1 TABLET BY MOUTH EVERY DAY, Disp: 90 tablet, Rfl: 1   amLODipine (NORVASC) 10 MG tablet, TAKE 1 TABLET BY MOUTH EVERY DAY, Disp: 90 tablet, Rfl: 1   apixaban (ELIQUIS) 5 MG TABS tablet, Take 1 tablet (5 mg total) by mouth 2 (two) times daily., Disp: 180 tablet, Rfl: 3   Dulaglutide (TRULICITY) 3 MG/0.5ML SOPN, Inject 3 mg into the skin once a week., Disp: 2 mL, Rfl: 11   esomeprazole (NEXIUM) 40 MG capsule, Take 40 mg by mouth daily before breakfast., Disp: , Rfl:    furosemide (LASIX) 20 MG tablet, TAKE 2 TABLETS BY MOUTH TWICE A DAY, Disp: 360 tablet, Rfl: 1   gabapentin (NEURONTIN) 300 MG capsule, Take 1-2 capsules (300-600 mg total) by mouth 3 (three) times daily., Disp: 150 capsule, Rfl: 5   glipiZIDE (GLUCOTROL) 5 MG tablet, TAKE 2 TABLETS (10 MG TOTAL) BY MOUTH IN AM AND 5 MG BEFORE SUPPER., Disp: 360 tablet, Rfl: 1   hydrALAZINE (APRESOLINE) 25 MG tablet, TAKE 1 TABLET BY MOUTH THREE TIMES A DAY, Disp: 270 tablet, Rfl: 1    HYDROcodone-acetaminophen (NORCO/VICODIN) 5-325 MG tablet, Take 1 tablet by mouth 3 (three) times daily as needed for moderate pain., Disp: 30 tablet, Rfl: 0   Insulin Glargine (BASAGLAR KWIKPEN) 100 UNIT/ML, Inject 35 Units into the skin daily., Disp: 30 mL, Rfl: 1   isosorbide mononitrate (IMDUR) 30 MG 24 hr tablet, TAKE 1 TABLET BY MOUTH EVERY DAY, Disp: 90 tablet, Rfl: 1   methimazole (TAPAZOLE) 5 MG tablet, TAKE 1/2 TABLET BY MOUTH DAILY, Disp: 45 tablet, Rfl: 1   metoprolol succinate (TOPROL-XL) 100 MG 24 hr tablet, TAKE 1 TABLET BY MOUTH EVERY DAY WITH OR IMMEDIATELY FOLLOWING A MEAL, Disp: 90 tablet, Rfl: 2   ondansetron (ZOFRAN) 4 MG tablet, Take 1 tablet (4 mg total) by mouth every 8 (eight) hours as needed for nausea or vomiting., Disp: 20 tablet, Rfl: 0   rosuvastatin (CRESTOR) 10 MG tablet, TAKE 1 TABLET BY MOUTH EVERY DAY, Disp: 90 tablet, Rfl: 1   tiZANidine (ZANAFLEX) 2 MG tablet, TAKE 1 TABLET BY MOUTH EVERY 6 HOURS AS NEEDED FOR MUSCLE SPASMS., Disp: 60 tablet, Rfl: 1   VOLTAREN 1 % GEL, Apply 2 g topically 4 (four) times daily. (Patient taking differently: Apply 2 g topically as needed.), Disp: 100 g, Rfl: 3  Cardiovascular studies:  EKG 04/24/2022: Sinus rhythm 67 bpm Possible old anteroseptal infarct  Lexiscan Tetrofosmin stress test 02/20/2022: Eugenie Birks  nuclear stress test performed using 1-day protocol. Normal myocardial perfusion. Stress LVEF 47%, although visually appears normal. Low risk study. No significant change compared to previous study on 01/17/2021.   CT cardiac scoring 02/13/2022: - Total Calcium Score: 1143.  -- Left Main: 0.  -- Left Anterior Descending: 641.  -- Circumflex: 381.  -- Right Coronary: 121  -- Posterior Descending Artery: 0   - Cardiovascular:  Normal heart size. No pericardial effusion.  Mild fusiform aneurysmal dilatation of the ascending thoracic aorta measuring up to 4.1 cm   EKG 02/03/2022: Sinus rhythm 78 bpm Low voltage in  precordial leads Old anteroseptal infarct  Echocardiogram 12/06/2020: Left ventricle cavity is normal in size. Moderate concentric hypertrophy of the left ventricle. Normal global wall motion. Normal LV systolic function with EF 62%. Doppler evidence of grade I (impaired) diastolic dysfunction, normal LAP.  Trace MR, trace TR. No evidence of pulmonary hypertension. Compared to previous study in 2019, atrial dilatation, mild PH now absent.  Renal artery duplex ([facility]) 05/23/2017: No renal artery stenosis  Recent labs: 03/31/2022: Glucose 83, BUN/Cr 22/1.6. EGFR 34. Na/K 142/4.6. Rest of the CMP normal H/H 11/36. MCV 71. Platelets 376 Chol 133, TG 108, HDL 40, LDL 73  02/17/2022: NT pro BNP 95 normal  12/30/2021: HbA1C 6.7%  05/02/2021: Glucose 190, BUN/Cr 28/1.46. EGFR 35. Na/K 137/4.1. Rest of the CMP normal H/H 10/33. MCV 69. Platelets 383 HbA1C 7.4% Chol 133, TG 100, HDL 45, LDL 73 TSH 2.4 normal  Review of Systems  Constitutional: Negative for chills and fever.  Cardiovascular:  Negative for chest pain, dyspnea on exertion, leg swelling, palpitations and syncope.         Vitals:   04/24/22 0956 04/24/22 1002  BP: (!) 152/68 138/69  Pulse: 69 67     Body mass index is 33.81 kg/m. Filed Weights   04/24/22 0956  Weight: 197 lb (89.4 kg)     Objective:   Physical Exam Vitals and nursing note reviewed.  Constitutional:      General: She is not in acute distress. Neck:     Vascular: No JVD.  Cardiovascular:     Rate and Rhythm: Normal rate and regular rhythm.     Heart sounds: Normal heart sounds. No murmur heard. Pulmonary:     Effort: Pulmonary effort is normal.     Breath sounds: Normal breath sounds. No wheezing or rales.  Musculoskeletal:     Right lower leg: No tenderness. No edema.     Left lower leg: No tenderness. No edema.           Assessment & Recommendations:    77 year old African American female withf Graves' disease with  hyperthyroidism, paroxysmal atrial fibrillation, mild PH, HFpEF (secondary to Afib), uncontrolled type II diabetes mellitus with diabetic polyneuropathy, CKD stage III.  Exertional dyspnea: NTProBNP normal. Echocardiogram pending. Elevated CAC but nuclear stress test without ischemia (02/2022) Suspect dyspnea was due to deconditioning and elevated blood pressure, now improved.   Elevated CAC: Lipids well controlled. Will check lipoprotein (a) before increasing satin dose.  Patient will be screened for lipoprotein (a) clinical trial.  PAF: Maintaining sinus rhythm. CHA2DS2VASc score 4, annual stroke risk 5%. Continue anticaogualtion with eliquis 5 mg bid.   Hypertension: Well controlled. She should not be on ACEi/ARB/aldosterone antagonist due to pevious episodes of hyperkalemia.   Grave's disease, DM:  F/u w/endocrinology   F/u in 6 months    Elder Negus, MD Pager: [phone] Office: [phone]

## 2022-05-08 ENCOUNTER — Ambulatory Visit (INDEPENDENT_AMBULATORY_CARE_PROVIDER_SITE_OTHER): Payer: Medicare Other | Admitting: Internal Medicine

## 2022-05-08 ENCOUNTER — Encounter: Payer: Self-pay | Admitting: Internal Medicine

## 2022-05-08 VITALS — BP 124/60 | HR 71 | Ht 64.0 in | Wt 193.8 lb

## 2022-05-08 DIAGNOSIS — E0500 Thyrotoxicosis with diffuse goiter without thyrotoxic crisis or storm: Secondary | ICD-10-CM | POA: Diagnosis not present

## 2022-05-08 DIAGNOSIS — E1169 Type 2 diabetes mellitus with other specified complication: Secondary | ICD-10-CM

## 2022-05-08 DIAGNOSIS — E785 Hyperlipidemia, unspecified: Secondary | ICD-10-CM

## 2022-05-08 DIAGNOSIS — Z7985 Long-term (current) use of injectable non-insulin antidiabetic drugs: Secondary | ICD-10-CM

## 2022-05-08 DIAGNOSIS — E119 Type 2 diabetes mellitus without complications: Secondary | ICD-10-CM | POA: Insufficient documentation

## 2022-05-08 DIAGNOSIS — Z794 Long term (current) use of insulin: Secondary | ICD-10-CM | POA: Diagnosis not present

## 2022-05-08 DIAGNOSIS — E1165 Type 2 diabetes mellitus with hyperglycemia: Secondary | ICD-10-CM

## 2022-05-08 DIAGNOSIS — Z7984 Long term (current) use of oral hypoglycemic drugs: Secondary | ICD-10-CM

## 2022-05-08 DIAGNOSIS — E118 Type 2 diabetes mellitus with unspecified complications: Secondary | ICD-10-CM

## 2022-05-08 LAB — T3, FREE: T3, Free: 2.9 pg/mL (ref 2.3–4.2)

## 2022-05-08 LAB — POCT GLYCOSYLATED HEMOGLOBIN (HGB A1C): Hemoglobin A1C: 6.2 % — AB (ref 4.0–5.6)

## 2022-05-08 LAB — TSH: TSH: 1.15 u[IU]/mL (ref 0.35–5.50)

## 2022-05-08 LAB — T4, FREE: Free T4: 0.77 ng/dL (ref 0.60–1.60)

## 2022-05-08 MED ORDER — GLIPIZIDE 5 MG PO TABS: 5.0000 mg | ORAL_TABLET | Freq: Two times a day (BID) | ORAL | 3 refills | Status: DC

## 2022-05-08 MED ORDER — METHIMAZOLE 5 MG PO TABS: 2.5000 mg | ORAL_TABLET | Freq: Every day | ORAL | 3 refills | Status: DC

## 2022-05-08 MED ORDER — TRULICITY 3 MG/0.5ML ~~LOC~~ SOAJ: 3.0000 mg | SUBCUTANEOUS | 3 refills | Status: DC

## 2022-05-08 MED ORDER — BASAGLAR KWIKPEN 100 UNIT/ML ~~LOC~~ SOPN: 30.0000 [IU] | PEN_INJECTOR | Freq: Every day | SUBCUTANEOUS | 1 refills | Status: DC

## 2022-05-08 NOTE — Patient Instructions (Addendum)
Please continue: - Trulicity 3 mg weekly - Basaglar 30 units at night  Please reduce: - Glipizide 5 mg before b'fast and 5 mg before a large dinner  Also, continue: - Methimazole 2.5 mg daily  Please return in 4 months with your sugar log.

## 2022-05-08 NOTE — Progress Notes (Addendum)
Patient ID: Donna Booker, female   DOB: 05-29-1945, 77 y.o.   MRN: [id number]  HPI: Donna Booker is a 77 y.o.-year-old female, presenting for f/u for DM2, dx in 1987, insulin-dependent since 2006, uncontrolled, with complications (PN, stage 2-3 CKD)and Graves ds. Last visit 4 months ago.  Interim history: No increased urination, blurry vision, nausea, chest pain.  She had a URI >> decreased appetite >> lost 13 lbs.  Graves ds  -Diagnosed during an admission for A. fib with RVR in 05/2017.  Reviewed her TFTs: Lab Results  Component Value Date   TSH 2.40 04/25/2021   TSH 2.90 10/15/2020   TSH 1.37 07/14/2020   TSH 3.94 06/28/2020   TSH 4.90 (H) 04/26/2020   TSH 5.06 (H) 02/26/2020   TSH 3.03 09/19/2019   TSH 0.46 06/05/2019   TSH <0.01 (L) 04/22/2019   TSH <0.01 (L) 03/03/2019   Lab Results  Component Value Date   FREET4 0.67 04/25/2021   FREET4 0.81 10/15/2020   FREET4 1.30 07/14/2020   FREET4 0.79 06/28/2020   FREET4 0.71 04/26/2020   FREET4 0.67 02/26/2020   FREET4 0.61 09/19/2019   FREET4 0.63 06/05/2019   FREET4 0.76 04/22/2019   FREET4 1.98 (H) 03/03/2019   Lab Results  Component Value Date   T3FREE 3.3 04/25/2021   T3FREE 3.2 10/15/2020   T3FREE 3.5 07/14/2020   T3FREE 3.4 06/28/2020   T3FREE 3.0 04/26/2020   T3FREE 3.0 02/26/2020   T3FREE 3.1 09/19/2019   T3FREE 3.2 06/05/2019   T3FREE 3.3 04/22/2019   T3FREE 4.4 (H) 03/03/2019   Graves' antibodies were elevated Lab Results  Component Value Date   TSI 8.02 (H) 04/16/2017   She was started on methimazole 10 mg 2x a day and atenolol 50 mg 3x a day.Her TFTs were significantly improved >> we decreased the methimazole to 5 mg 2X a day, but then on 5 mg daily (? When changed - by PCP? -Patient cannot remember exactly when she started her prescription for methimazole only mentioned to take it once a day...).   12/2017: We stopped methimazole as the TSH was 10.   02/2019: Patient finally return for labs:  TSH was suppressed completely 03/2019: We restarted methimazole 5 mg twice a day  03/2020: We decreased the methimazole dose to 5 mg daily 04/2020: I advised her to decrease the methimazole dose to 2.5 mg daily >> SHE FORGOT, so she continued on 5 mg daily 06/2020: Decreased MMI to 2.5 mg daily  She continues Toprol-XL.  Pt denies: - feeling nodules in neck - hoarseness - dysphagia - choking  No FH of thyroid disease or thyroid cancer. No h/o radiation tx to head or neck. No herbal supplements. No Biotin use. No recent steroids use.   DM2: Reviewed HbA1c levels: Lab Results  Component Value Date   HGBA1C 6.7 (A) 12/30/2021   HGBA1C 9.0 (A) 08/30/2021   HGBA1C 7.4 (A) 04/25/2021   HGBA1C 9.5 (A) 10/15/2020   HGBA1C 6.1 (A) 06/28/2020   HGBA1C 6.8 (H) 02/26/2020   HGBA1C 8.1 (A) 11/25/2019   HGBA1C 8.6 (A) 09/19/2019   HGBA1C 7.3 (A) 06/05/2019   HGBA1C 8.8 (H) 02/26/2019   HGBA1C 13.0 04/26/2018   HGBA1C 14.1 (H) 03/19/2018   HGBA1C 13.1 (A) 12/28/2017   HGBA1C 9.8 (H) 07/14/2017   HGBA1C 8.0 02/01/2016   HGBA1C 8.3 (H) 10/20/2015   HGBA1C 8.4 07/29/2015   HGBA1C 10.0 04/27/2015   HGBA1C 9.5 (H) 12/29/2014   HGBA1C 9.1 (H)  09/01/2014  10/15/2020:  HbA1c calculated from fructosamine is much better, at 7.45%, correlating better with her blood sugars at home.   She is on: - Glipizide 10 mg 2x a day before meals >> 10 mg in a.m. and 5 mg in p.m.  - Levemir 50 >> 40 >> Lantus 35 units at night >> Basaglar 30 units at night - Trulicity 1.5 mg weekly - started 12/2018 >> 3 mg weekly  She was previously on a VGo patch pump.   She also tried Bydureon (02/2013-10/2014) >> made her hungry, did not lower sugars  She checks sugars once a day: - am: 74-98, 115, 120 >> 79-117 >> 67, 79-110, 120 >> 61-116, 127 - 2h after b'fast: 144 >> 84, 95-143, 154 >> n/c >> 127, 130 >> n/c - before lunch: n/c >> 136, 158 >> n/c >> 137, 140 >> 150-200 >> n/c - 2h after lunch: 1n/c >> 136, 140  >> 340 >> n/c >> 123 - before dinner: 134-144 >> 108, 191 >> n/c >> 110 >> 125-140 >> n/c - 2h after dinner: n/c >> 175-201 >> n/c >> 194  >> 205 >> n/c >> 190 - bedtime: 192, 265 >> 130-137 >> ? >> 207 >> n/c - nighttime: n/c Lowest sugar was 48 >> 81 >> 67 >> 61; she has hypoglycemia awareness in the 70s. Highest sugar was 340 (cake) >> 205 >> 270 (of Trulicity) >> 147 >> 190.  Glucometer: AccuChek  -+ CKD, last BUN/creatinine:  Lab Results  Component Value Date   BUN 22 03/31/2022   CREATININE 1.60 (H) 03/31/2022  04/25/2018: Glucose 269, BUN/creatinine 18/1.05 Lab Results  Component Value Date   MICRALBCREAT 35.4 (H) 02/28/2021   MICRALBCREAT 110.0 (H) 09/19/2019   MICRALBCREAT 192.6 (H) 06/05/2019  Previously on olmesartan, now off (?)  -+ HL; last set of lipids: Lab Results  Component Value Date   CHOL 133 03/31/2022   HDL 40 03/31/2022   LDLCALC 73 03/31/2022   LDLDIRECT 98.0 03/19/2018   TRIG 108 03/31/2022   CHOLHDL 3.3 03/31/2022  On Crestor 10.  - last eye exam was in 2023: No DR reportedly. Coming up this month.  - no numbness and tingling in her feet.  Latest foot exam was 2021/09/21.  Her son died in 07-Jul-2017-he had severe heart failure.  ROS: + See HPI  I reviewed pt's medications, allergies, PMH, social hx, family hx, and changes were documented in the history of present illness. Otherwise, unchanged from my initial visit note.  Past Medical History:  Diagnosis Date   Afib (HCC)    Diabetes mellitus    Graves disease    Heart failure (HCC)    Hypertension    Hyperthyroidism    Past Surgical History:  Procedure Laterality Date   BREAST SURGERY     Social History   Social History   Marital Status: Married    Spouse Name: N/A   Number of Children: 1   Occupational History   Customer svc rep   Social History Main Topics   Smoking status: Never Smoker    Smokeless tobacco: Not on file   Alcohol Use: No   Drug Use: No   Current  Outpatient Medications on File Prior to Visit  Medication Sig Dispense Refill   Abatacept (ORENCIA Plato) Inject into the skin every 30 (thirty) days.     albuterol (VENTOLIN HFA) 108 (90 Base) MCG/ACT inhaler Inhale 2 puffs into the lungs every 6 (six) hours as needed  for wheezing or shortness of breath. 8 g 2   allopurinol (ZYLOPRIM) 300 MG tablet TAKE 1 TABLET BY MOUTH EVERY DAY 90 tablet 1   amLODipine (NORVASC) 10 MG tablet TAKE 1 TABLET BY MOUTH EVERY DAY 90 tablet 1   apixaban (ELIQUIS) 5 MG TABS tablet Take 1 tablet (5 mg total) by mouth 2 (two) times daily. 180 tablet 3   Dulaglutide (TRULICITY) 3 MG/0.5ML SOPN Inject 3 mg into the skin once a week. 2 mL 11   esomeprazole (NEXIUM) 40 MG capsule Take 40 mg by mouth daily before breakfast.     furosemide (LASIX) 20 MG tablet TAKE 2 TABLETS BY MOUTH TWICE A DAY 360 tablet 1   gabapentin (NEURONTIN) 300 MG capsule Take 1-2 capsules (300-600 mg total) by mouth 3 (three) times daily. 150 capsule 5   glipiZIDE (GLUCOTROL) 5 MG tablet TAKE 2 TABLETS (10 MG TOTAL) BY MOUTH IN AM AND 5 MG BEFORE SUPPER. 360 tablet 1   hydrALAZINE (APRESOLINE) 25 MG tablet TAKE 1 TABLET BY MOUTH THREE TIMES A DAY 270 tablet 1   HYDROcodone-acetaminophen (NORCO/VICODIN) 5-325 MG tablet Take 1 tablet by mouth 3 (three) times daily as needed for moderate pain. 30 tablet 0   Insulin Glargine (BASAGLAR KWIKPEN) 100 UNIT/ML Inject 35 Units into the skin daily. 30 mL 1   isosorbide mononitrate (IMDUR) 30 MG 24 hr tablet TAKE 1 TABLET BY MOUTH EVERY DAY 90 tablet 1   methimazole (TAPAZOLE) 5 MG tablet TAKE 1/2 TABLET BY MOUTH DAILY 45 tablet 1   metoprolol succinate (TOPROL-XL) 100 MG 24 hr tablet TAKE 1 TABLET BY MOUTH EVERY DAY WITH OR IMMEDIATELY FOLLOWING A MEAL 90 tablet 2   ondansetron (ZOFRAN) 4 MG tablet Take 1 tablet (4 mg total) by mouth every 8 (eight) hours as needed for nausea or vomiting. 20 tablet 0   rosuvastatin (CRESTOR) 10 MG tablet TAKE 1 TABLET BY MOUTH  EVERY DAY 90 tablet 1   tiZANidine (ZANAFLEX) 2 MG tablet TAKE 1 TABLET BY MOUTH EVERY 6 HOURS AS NEEDED FOR MUSCLE SPASMS. 60 tablet 1   VOLTAREN 1 % GEL Apply 2 g topically 4 (four) times daily. (Patient taking differently: Apply 2 g topically as needed.) 100 g 3   No current facility-administered medications on file prior to visit.   Allergies  Allergen Reactions   Ibuprofen    Tramadol Nausea And Vomiting    FH - see HPI  PE: BP 124/60 (BP Location: Right Arm, Patient Position: Sitting, Cuff Size: Normal)   Pulse 71   Ht 5\' 4"  (1.626 m)   Wt 193 lb 12.8 oz (87.9 kg)   SpO2 92%   BMI 33.27 kg/m  Wt Readings from Last 3 Encounters:  05/08/22 193 lb 12.8 oz (87.9 kg)  04/24/22 197 lb (89.4 kg)  04/20/22 191 lb (86.6 kg)   Constitutional: overweight, in NAD.  She walks with a cane. Eyes:  EOMI, no exophthalmos ENT: no neck masses, no cervical lymphadenopathy Cardiovascular: RRR, No MRG Respiratory: CTA B Musculoskeletal: no deformities Skin:no rashes Neurological: no tremor with outstretched hands  ASSESSMENT: 1. 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  Graves' disease  3.  HL  We previously also addressed her hyperkalemia: - in 07/2017, she had a critical value of potassium of 7.5.  She was admitted with high potassium since then >> Lasix, potassium supplementation, and lisinopril were stopped. - A cosyntropin stimulation test performed in the hospital was  normal, ruling out primary adrenal insufficiency as a cause for hyperkalemia - Aldosterone and renin levels were normal during her last hospitalization - On her medication list, I do not see any possible culprits,  other than possibly propranolol - I suggested a referral to nephrology  if her potassium continues to remain elevated after stopping ACE inhibitor and potassium supplementation - Potassium normalized afterwards  PLAN:  1. Patient with longstanding,  previously uncontrolled type 2 diabetes, with improving control on a regimen containing an injectable GLP-1 receptor agonist, long-acting insulin and sulfonylurea. -At today's visit, she describes decreased appetite and lost 13 pounds since last visit.  She had some low blood sugars in the morning, with the lowest being at 61.  Will go ahead and decrease her glipizide doses.  I advised her to take 5 instead of 10 mg in a.m. and 5 mg at night but only before a large dinner.  We discussed what constitutes a larger dinner.  Will continue Trulicity and Hospital doctor for now.  If sugars continue to decrease, we may need to decrease Basaglar versus stopping glipizide, depending on blood sugar patterns. - I suggested to:  Patient Instructions  Please continue: - Trulicity 3 mg weekly - Basaglar 30 units at night  Please reduce: - Glipizide 5 mg before b'fast and 5 mg before a large dinner  Also, continue: - Methimazole 2.5 mg daily  Please return in 4 months with your sugar log.   - we checked her HbA1c: 6.2% (lower)  - advised to check sugars at different times of the day - 1x a day, rotating check times - advised for yearly eye exams >> she is UTD and has an appointment coming up - return to clinic in 4 months  2.  Graves' disease -Her hyperthyroidism was diagnosed after she was admitted with A-fib with RVR and acute pulmonary edema in 2019 -We diagnosed Graves' disease based on elevated TSI's and clinical course -We started methimazole initially, however, we then had to stop this completely after TSH increased to 10 and then restarted it in 03/2019 at 5 mg twice a day as the TSH returned suppressed.  Afterwards we were able to decrease the dose down to 2.5 mg daily.  She continues on this dose now. -Latest TSH was normal: Lab Results  Component Value Date   TSH 2.40 04/25/2021  -At today's she denies hyperthyroid symptoms including palpitations, tremors, heat intolerance. She recently lost 13 lbs  2/2 URI >> decreased  appetite. -We will recheck her TFTs today as she did not stop at the lab at last visit  3. HL -Reviewed latest panel from 03/2022: Fractions at goal exception of a slightly elevated LDL: Lab Results  Component Value Date   CHOL 133 03/31/2022   HDL 40 03/31/2022   LDLCALC 73 03/31/2022   LDLDIRECT 98.0 03/19/2018   TRIG 108 03/31/2022   CHOLHDL 3.3 03/31/2022  -She continues 10 mg of Crestor daily without side effects  Needs MMI refills.  Component     Latest Ref Rng 05/08/2022  Triiodothyronine,Free,Serum     2.3 - 4.2 pg/mL 2.9   T4,Free(Direct)     0.60 - 1.60 ng/dL 1.61   TSH     0.96 - 0.45 uIU/mL 1.15   TFTs normal -will continue the same dose of methimazole.  Carlus Pavlov, MD PhD [facility]

## 2022-05-08 NOTE — Addendum Note (Signed)
Addended by: Kenyon Ana on: 05/08/2022 05:02 PM   Modules accepted: Orders

## 2022-05-08 NOTE — Addendum Note (Signed)
Addended by: Carlus Pavlov on: 05/08/2022 04:48 PM   Modules accepted: Orders

## 2022-05-19 ENCOUNTER — Ambulatory Visit (HOSPITAL_COMMUNITY)
Admission: RE | Admit: 2022-05-19 | Discharge: 2022-05-19 | Disposition: A | Discharge status: Home / Self Care | Payer: Medicare Other | Source: Ambulatory Visit | Attending: Cardiology | Admitting: Cardiology

## 2022-05-19 DIAGNOSIS — E119 Type 2 diabetes mellitus without complications: Secondary | ICD-10-CM | POA: Insufficient documentation

## 2022-05-19 DIAGNOSIS — I10 Essential (primary) hypertension: Secondary | ICD-10-CM | POA: Insufficient documentation

## 2022-05-19 DIAGNOSIS — I083 Combined rheumatic disorders of mitral, aortic and tricuspid valves: Secondary | ICD-10-CM | POA: Diagnosis not present

## 2022-05-19 DIAGNOSIS — R0609 Other forms of dyspnea: Secondary | ICD-10-CM | POA: Diagnosis present

## 2022-05-19 DIAGNOSIS — E785 Hyperlipidemia, unspecified: Secondary | ICD-10-CM | POA: Diagnosis not present

## 2022-05-19 DIAGNOSIS — R0600 Dyspnea, unspecified: Secondary | ICD-10-CM | POA: Diagnosis not present

## 2022-05-21 LAB — ECHOCARDIOGRAM COMPLETE
AR max vel: 2.59 cm2
AV Area VTI: 2.47 cm2
AV Area mean vel: 2.55 cm2
AV Mean grad: 4 mmHg
AV Peak grad: 6.8 mmHg
Ao pk vel: 1.31 m/s
Area-P 1/2: 3.21 cm2
Calc EF: 69.3 %
S' Lateral: 2.8 cm
Single Plane A2C EF: 68.4 %
Single Plane A4C EF: 70.5 %

## 2022-05-22 NOTE — Progress Notes (Signed)
Called patient, NA, left echocardiogram results on VM.

## 2022-06-05 ENCOUNTER — Other Ambulatory Visit: Payer: Self-pay | Admitting: Family Medicine

## 2022-06-05 NOTE — Telephone Encounter (Signed)
Last OV 04/10/19 Next OV not scheduled  It has been > 1 year since pt has been seen in clinic, pt needs OV.

## 2022-06-07 ENCOUNTER — Other Ambulatory Visit: Payer: Self-pay | Admitting: Family Medicine

## 2022-06-07 NOTE — Telephone Encounter (Signed)
Last OV 04/10/19 Next OV not scheduled  Last refill 03/21/21 Qty #90/1  Pt has not been seen in over 1 year.

## 2022-06-17 ENCOUNTER — Other Ambulatory Visit: Payer: Self-pay | Admitting: Cardiology

## 2022-06-17 DIAGNOSIS — R0609 Other forms of dyspnea: Secondary | ICD-10-CM

## 2022-07-07 LAB — HM MAMMOGRAPHY

## 2022-07-10 ENCOUNTER — Other Ambulatory Visit: Payer: Self-pay | Admitting: Cardiology

## 2022-07-10 DIAGNOSIS — I480 Paroxysmal atrial fibrillation: Secondary | ICD-10-CM

## 2022-08-14 ENCOUNTER — Telehealth: Payer: Self-pay

## 2022-08-14 MED ORDER — INSULIN GLARGINE 100 UNIT/ML SOLOSTAR PEN: 30.0000 [IU] | PEN_INJECTOR | Freq: Every day | SUBCUTANEOUS | 2 refills | Status: DC

## 2022-08-14 NOTE — Telephone Encounter (Signed)
J, We do not absolutely need to Use Basaglar.  Lantus is cheaper, it has a price cap of $35.  Please send this and have her check on this but if there is a delay we can give her some samples until we can figure this out. C

## 2022-08-14 NOTE — Telephone Encounter (Signed)
Pt has been notified and the rx has been sent for Lantus, we d not have samples.   Requested Prescriptions   Signed Prescriptions Disp Refills   insulin glargine (LANTUS) 100 UNIT/ML Solostar Pen 30 mL 2    Sig: Inject 30 Units into the skin daily.    Authorizing Provider: Carlus Pavlov    Ordering User: Pollie Meyer

## 2022-08-14 NOTE — Telephone Encounter (Signed)
Patient called and states that her Drug plan has been canceled. She is unable to get her Basaglar, its going to cost her $700 oop, Is there an alternative? I also advised to her we could try getting her patient assistance but not sure how long it would be before the medication could get to her .

## 2022-08-21 ENCOUNTER — Other Ambulatory Visit: Payer: Self-pay | Admitting: Cardiology

## 2022-08-21 DIAGNOSIS — I10 Essential (primary) hypertension: Secondary | ICD-10-CM

## 2022-08-28 ENCOUNTER — Other Ambulatory Visit: Payer: Self-pay | Admitting: Internal Medicine

## 2022-08-28 DIAGNOSIS — N281 Cyst of kidney, acquired: Secondary | ICD-10-CM

## 2022-09-05 ENCOUNTER — Other Ambulatory Visit: Payer: Medicare Other

## 2022-09-11 ENCOUNTER — Ambulatory Visit (INDEPENDENT_AMBULATORY_CARE_PROVIDER_SITE_OTHER): Payer: Medicare Other | Admitting: Internal Medicine

## 2022-09-11 ENCOUNTER — Encounter: Payer: Self-pay | Admitting: Internal Medicine

## 2022-09-11 ENCOUNTER — Ambulatory Visit
Admission: RE | Admit: 2022-09-11 | Discharge: 2022-09-11 | Disposition: A | Discharge status: Home / Self Care | Payer: Medicare Other | Source: Ambulatory Visit | Attending: Internal Medicine | Admitting: Internal Medicine

## 2022-09-11 VITALS — BP 136/70 | HR 67 | Ht 64.0 in | Wt 194.4 lb

## 2022-09-11 DIAGNOSIS — Z7984 Long term (current) use of oral hypoglycemic drugs: Secondary | ICD-10-CM | POA: Diagnosis not present

## 2022-09-11 DIAGNOSIS — Z794 Long term (current) use of insulin: Secondary | ICD-10-CM | POA: Diagnosis not present

## 2022-09-11 DIAGNOSIS — N281 Cyst of kidney, acquired: Secondary | ICD-10-CM

## 2022-09-11 DIAGNOSIS — E1169 Type 2 diabetes mellitus with other specified complication: Secondary | ICD-10-CM | POA: Diagnosis not present

## 2022-09-11 DIAGNOSIS — Z7985 Long-term (current) use of injectable non-insulin antidiabetic drugs: Secondary | ICD-10-CM

## 2022-09-11 DIAGNOSIS — E0500 Thyrotoxicosis with diffuse goiter without thyrotoxic crisis or storm: Secondary | ICD-10-CM

## 2022-09-11 DIAGNOSIS — E1165 Type 2 diabetes mellitus with hyperglycemia: Secondary | ICD-10-CM | POA: Diagnosis not present

## 2022-09-11 DIAGNOSIS — E785 Hyperlipidemia, unspecified: Secondary | ICD-10-CM

## 2022-09-11 LAB — POCT GLYCOSYLATED HEMOGLOBIN (HGB A1C): Hemoglobin A1C: 6.2 % — AB (ref 4.0–5.6)
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
## 2022-09-11 NOTE — Addendum Note (Signed)
Addended by: Pollie Meyer on: 09/11/2022 11:50 AM   Modules accepted: Orders

## 2022-09-11 NOTE — Patient Instructions (Addendum)
Please continue: - Trulicity 3 mg weekly  Reduce: - Basaglar 25 units at night - Glipizide 5 mg before b'fast and 5 mg before a large dinner  Also, continue: - Methimazole 2.5 mg daily  Please return in 4 months with your sugar log.

## 2022-09-19 ENCOUNTER — Other Ambulatory Visit: Payer: Self-pay | Admitting: Cardiology

## 2022-09-19 DIAGNOSIS — E782 Mixed hyperlipidemia: Secondary | ICD-10-CM

## 2022-10-23 ENCOUNTER — Ambulatory Visit: Payer: Medicare Other | Attending: Cardiology | Admitting: Cardiology

## 2022-10-23 ENCOUNTER — Encounter: Payer: Self-pay | Admitting: Cardiology

## 2022-10-23 ENCOUNTER — Other Ambulatory Visit: Payer: Self-pay

## 2022-10-23 ENCOUNTER — Other Ambulatory Visit (HOSPITAL_COMMUNITY): Payer: Self-pay

## 2022-10-23 ENCOUNTER — Telehealth: Payer: Self-pay | Admitting: *Deleted

## 2022-10-23 VITALS — BP 142/72 | HR 74 | Resp 16 | Ht 64.0 in | Wt 193.8 lb

## 2022-10-23 DIAGNOSIS — I2510 Atherosclerotic heart disease of native coronary artery without angina pectoris: Secondary | ICD-10-CM | POA: Diagnosis not present

## 2022-10-23 DIAGNOSIS — I480 Paroxysmal atrial fibrillation: Secondary | ICD-10-CM | POA: Insufficient documentation

## 2022-10-23 DIAGNOSIS — R931 Abnormal findings on diagnostic imaging of heart and coronary circulation: Secondary | ICD-10-CM | POA: Insufficient documentation

## 2022-10-23 DIAGNOSIS — E782 Mixed hyperlipidemia: Secondary | ICD-10-CM | POA: Insufficient documentation

## 2022-10-23 DIAGNOSIS — I10 Essential (primary) hypertension: Secondary | ICD-10-CM | POA: Insufficient documentation

## 2022-10-23 MED ORDER — APIXABAN 5 MG PO TABS: 5.0000 mg | ORAL_TABLET | Freq: Two times a day (BID) | ORAL | Status: DC

## 2022-10-23 MED ORDER — ROSUVASTATIN CALCIUM 20 MG PO TABS: 20.0000 mg | ORAL_TABLET | Freq: Every day | ORAL | 3 refills | Status: DC

## 2022-10-23 MED ORDER — APIXABAN 5 MG PO TABS
5.0000 mg | ORAL_TABLET | Freq: Two times a day (BID) | ORAL | 3 refills | Status: DC
  Filled 2022-10-23: qty 60, 30d supply, fill #0

## 2022-10-23 NOTE — Patient Instructions (Signed)
Medication Instructions:   INCREASE YOUR ROSUVASTATIN (CRESTOR) TO 20 MG BY MOUTH DAILY  *If you need a refill on your cardiac medications before your next appointment, please call your pharmacy*   Lab Work:  PLEASE HAVE YOUR PCP CHECK/MANAGE YOUR LIPIDS  If you have labs (blood work) drawn today and your tests are completely normal, you will receive your results only by: MyChart Message (if you have MyChart) OR A paper copy in the mail If you have any lab test that is abnormal or we need to change your treatment, we will call you to review the results.     Follow-Up: At [facility], you and your health needs are our priority.  As part of our continuing mission to provide you with exceptional heart care, we have created designated Provider Care Teams.  These Care Teams include your primary Cardiologist (physician) and Advanced Practice Providers (APPs -  Physician Assistants and Nurse Practitioners) who all work together to provide you with the care you need, when you need it.  We recommend signing up for the patient portal called "MyChart".  Sign up information is provided on this After Visit Summary.  MyChart is used to connect with patients for Virtual Visits (Telemedicine).  Patients are able to view lab/test results, encounter notes, upcoming appointments, etc.  Non-urgent messages can be sent to your provider as well.   To learn more about what you can do with MyChart, go to [email or web address].    Your next appointment:   1 year(s)  Provider:   DR. Rosemary Holms

## 2022-10-23 NOTE — Progress Notes (Signed)
Cardiology Office Note:  .   Date:  10/23/2022  ID:  Donna Booker, DOB 1945-03-30, MRN [id number] PCP: Myrlene Broker, MD  [facility] Providers Cardiologist:  Truett Mainland, MD PCP: Myrlene Broker, MD  Chief Complaint  Patient presents with   Paroxysmal atrial fibrillation    Follow-up    6 months      History of Present Illness: .    Donna Booker is a 77 y.o. female with  Graves' disease with hyperthyroidism, paroxysmal atrial fibrillation, mild PH, HFpEF (secondary to Afib), uncontrolled type II diabetes mellitus with diabetic polyneuropathy, CKD stage III.   Patient is doing well, denies chest pain, shortness of breath, palpitations, leg edema, orthopnea, PND, TIA/syncope.  She had recent change in her insurance, therefore facing difficulty paying for Eliquis.  This should be rectified and the coverage starts again in November 2024.  Blood pressure slightly elevated today, but has been very well-controlled otherwise.  Vitals:   10/23/22 1017  BP: (!) 142/72  Pulse: 74  Resp: 16  SpO2: 97%     ROS:  Review of Systems  Cardiovascular:  Negative for chest pain, dyspnea on exertion, leg swelling, palpitations and syncope.     Studies Reviewed: Marland Kitchen       Labs 03/2022: Chol 133, TG 108, HDL 40, LDL 73   Risk Assessment/Calculations:    CHA2DS2-VASc Score = 5 . This indicates a 7.2 % annual risk of stroke.   Physical Exam:   Physical Exam Vitals and nursing note reviewed.  Constitutional:      General: She is not in acute distress. Neck:     Vascular: No JVD.  Cardiovascular:     Rate and Rhythm: Normal rate and regular rhythm.     Heart sounds: Normal heart sounds. No murmur heard. Pulmonary:     Effort: Pulmonary effort is normal.     Breath sounds: Normal breath sounds. No wheezing or rales.  Musculoskeletal:     Right lower leg: No edema.     Left lower leg: No edema.      VISIT DIAGNOSES: No diagnosis  found.   ASSESSMENT AND PLAN: .    RATEEL PLINE is a 77 y.o. female with Graves' disease with hyperthyroidism, paroxysmal atrial fibrillation, mild PH, HFpEF (secondary to Afib), uncontrolled type II diabetes mellitus with diabetic polyneuropathy, CKD stage III.  Elevated CAC: Elevated CAC but nuclear stress test without ischemia (02/2022) LDL 73 in 03/2022. Increase Crestor to 20 mg daily.  Continue to lipid panel checked with PCP Dr. Okey Dupre.  PAF: Maintaining sinus rhythm. CHA2DS2VASc score 5, annual stroke risk 7.2%. Continue anticaogualtion with eliquis 5 mg bid.   Hypertension: Generally well-controlled, only mildly elevated today.  She had a respiratory's appointment which could have contributed.  No changes made today.   She should not be on ACEi/ARB/aldosterone antagonist due to pevious episodes of hyperkalemia.   Grave's disease, DM:  F/u w/endocrinology   Meds ordered this encounter  Medications   rosuvastatin (CRESTOR) 20 MG tablet    Sig: Take 1 tablet (20 mg total) by mouth daily.    Dispense:  90 tablet    Refill:  3    Dose increase   apixaban (ELIQUIS) 5 MG TABS tablet    Sig: Take 1 tablet (5 mg total) by mouth 2 (two) times daily.    Order Specific Question:   Lot Number?    Answer:   [id number]    Order Specific Question:  Expiration Date?    Answer:   01/03/2024     F/u in 1 year  Signed, Elder Negus, MD

## 2022-10-23 NOTE — Telephone Encounter (Signed)
Pt had an OV today with Dr. Rosemary Holms.  She requested help and assistance with eliquis.   Staff message sent to rx pt assistance team to further manage and help the pt with pt assistance for eliquis.  One weeks worth of eliquis samples provided to the pt while in clinic.

## 2022-10-23 NOTE — Telephone Encounter (Signed)
Prescription refill request for Eliquis received. Indication:afib Last office visit:10/24 Scr:1.6  3/24 Age: 77 Weight:87.9  kg  Prescription refilled

## 2022-10-25 ENCOUNTER — Ambulatory Visit: Payer: Medicare Other | Admitting: Cardiology

## 2022-10-26 ENCOUNTER — Other Ambulatory Visit: Payer: Self-pay | Admitting: Internal Medicine

## 2022-10-26 DIAGNOSIS — E118 Type 2 diabetes mellitus with unspecified complications: Secondary | ICD-10-CM

## 2022-10-27 ENCOUNTER — Other Ambulatory Visit (HOSPITAL_COMMUNITY): Payer: Self-pay

## 2022-10-27 ENCOUNTER — Telehealth: Payer: Self-pay

## 2022-10-27 NOTE — Telephone Encounter (Signed)
Patient is working to obtain proof of income documentation for her and her spouse along with 3% spending expense report from the pharmacy for both household members. Patient given fax number [phone] to fax documents once obtained.

## 2022-10-30 ENCOUNTER — Other Ambulatory Visit (HOSPITAL_COMMUNITY): Payer: Self-pay

## 2022-10-30 NOTE — Telephone Encounter (Signed)
Received documents for pt on my fax, working to complete application

## 2022-10-30 NOTE — Telephone Encounter (Signed)
PAP: Application for ELIQUIS has been submitted to PAP Companies: [employer], via fax If status update is requested, refer to BMS at [phone]

## 2022-11-01 ENCOUNTER — Other Ambulatory Visit (HOSPITAL_COMMUNITY): Payer: Self-pay

## 2022-11-01 NOTE — Telephone Encounter (Signed)
PAP: Patient has been denied for pt assistance by PAP Companies: [employer] due to patient below income threshold for program, needs to apply for Extra Help Low Income Subsidy (LIS) [email or web address] Letter has been mailed to patient.

## 2022-11-01 NOTE — Telephone Encounter (Signed)
Called pt advised of pt assistance denial.  Pt did not want information for Extra Help program.   Reports has a drug plan that will go into effect on 11/03/2022.  Advised pt if needs information in the future to let our office know. Pt verbalizes understanding.

## 2022-11-03 ENCOUNTER — Encounter: Payer: Self-pay | Admitting: *Deleted

## 2022-11-03 NOTE — Research (Unsigned)
Spoke with [name] about Donna Booker research. States she is not interested due to the distance she is from [place].

## 2022-11-08 ENCOUNTER — Other Ambulatory Visit: Payer: Self-pay | Admitting: Cardiology

## 2022-11-08 DIAGNOSIS — R600 Localized edema: Secondary | ICD-10-CM

## 2022-11-09 ENCOUNTER — Other Ambulatory Visit (HOSPITAL_COMMUNITY): Payer: Self-pay

## 2022-11-20 ENCOUNTER — Other Ambulatory Visit (HOSPITAL_COMMUNITY): Payer: Self-pay

## 2022-12-01 ENCOUNTER — Other Ambulatory Visit: Payer: Self-pay | Admitting: Cardiology

## 2022-12-01 DIAGNOSIS — I10 Essential (primary) hypertension: Secondary | ICD-10-CM

## 2022-12-04 ENCOUNTER — Other Ambulatory Visit: Payer: Self-pay | Admitting: Cardiology

## 2022-12-04 DIAGNOSIS — R0609 Other forms of dyspnea: Secondary | ICD-10-CM

## 2022-12-05 ENCOUNTER — Telehealth: Payer: Self-pay | Admitting: *Deleted

## 2022-12-05 ENCOUNTER — Telehealth: Payer: Self-pay | Admitting: Internal Medicine

## 2022-12-05 NOTE — Telephone Encounter (Signed)
Mail received in Dr. Damian Leavell mailbox from [facility], stating pts eliquis is either not on pts plan Drug list (formulary) or may have limitations.  Did mention on the form that step therapy is needed and listed Xarelto as an alternative, making this a drug tier 3.  Did note in the pts chart that she applied for pt assistance for eliquis back in Oct, where this was denied.    Called the pt and asked her more about this, for [facility] offered her Extra Help program for this medication at that time,  and pt declined stating her plan would be changing on 11/1.  Pt states her new plan goes into affect on Jan 1 and she is unsure what the cost of eliquis would be for then.  Pt is still interested in the Extra Help program from [facility], if we could get this message to her about this.   Pt states she is going to call her new plan tomorrow and inquire more information about what the cost of eliquis would be, with her new plan starting Jan 1st.  Pt is aware that I will send this message to [employer] for further assistance.  Will also scan forms we received from [facility] about this, to [employer].  Advised her to call back with what she finds out about her new plan and cost of Eliquis starting Jan 1st.   Pt verbalized understanding and agrees with this plan.

## 2022-12-05 NOTE — Telephone Encounter (Signed)
Next OV is 12/15/2022.   Prescription Request  12/05/2022  LOV: 04/20/2022  What is the name of the medication or equipment?  gabapentin (NEURONTIN) 300 MG capsule  Have you contacted your pharmacy to request a refill? Yes   Which pharmacy would you like this sent to?  [id number] [place], [place] - [address] AT [facility] OF [facility] [address] [place] [address] Phone: [phone] Fax: [phone]    Patient notified that their request is being sent to the clinical staff for review and that they should receive a response within 2 business days.   Please advise at [facility] [phone]

## 2022-12-06 ENCOUNTER — Other Ambulatory Visit (HOSPITAL_COMMUNITY): Payer: Self-pay

## 2022-12-06 MED ORDER — GABAPENTIN 300 MG PO CAPS: 300.0000 mg | ORAL_CAPSULE | Freq: Three times a day (TID) | ORAL | 5 refills | Status: DC

## 2022-12-06 NOTE — Telephone Encounter (Signed)
Okay with me. Xarelto 15 mg daily.  Thanks MJP

## 2022-12-06 NOTE — Telephone Encounter (Signed)
Left the pt a message to call the office back to endorse switching her from eliquis to xarelto 15 mg daily per Dr. Rosemary Holms, due to non-coverage.

## 2022-12-06 NOTE — Telephone Encounter (Signed)
Spoke with the pt.  She is aware that it looks like Xarelto will be covered more with her new insurance plan, if  she'd prefer [place] switching her to this.  Pt states her pharmacy told her today that starting Jan 1st, the cost of her eliquis would go down, but they were unable to provide her with the exact amount.  Pt states she has enough eliquis to last her the rest of this month.  Pt states she wants to stay on  eliquis for now and see what the cost will be in the new year.   She states she will call us back in January, if the cost is still too much, so that we can switch her to Xarelto at that time.    Pt verbalized understanding and agrees with this plan.  Pt was more than gracious for all the assistance provided.

## 2022-12-06 NOTE — Telephone Encounter (Signed)
Per test claim plan prefers Xarelto. Is this something we can change the patient to instead of the Eliquis?

## 2022-12-06 NOTE — Telephone Encounter (Signed)
Sent in

## 2022-12-15 ENCOUNTER — Ambulatory Visit: Payer: Medicare Other | Admitting: Internal Medicine

## 2022-12-19 ENCOUNTER — Encounter: Payer: Self-pay | Admitting: Internal Medicine

## 2022-12-19 ENCOUNTER — Ambulatory Visit (INDEPENDENT_AMBULATORY_CARE_PROVIDER_SITE_OTHER): Payer: Medicare Other | Admitting: Internal Medicine

## 2022-12-19 ENCOUNTER — Telehealth: Payer: Self-pay | Admitting: Internal Medicine

## 2022-12-19 VITALS — BP 126/80 | HR 78 | Temp 97.9°F | Ht 64.0 in | Wt 184.0 lb

## 2022-12-19 DIAGNOSIS — I480 Paroxysmal atrial fibrillation: Secondary | ICD-10-CM

## 2022-12-19 DIAGNOSIS — E1169 Type 2 diabetes mellitus with other specified complication: Secondary | ICD-10-CM

## 2022-12-19 DIAGNOSIS — N1832 Chronic kidney disease, stage 3b: Secondary | ICD-10-CM

## 2022-12-19 DIAGNOSIS — Z794 Long term (current) use of insulin: Secondary | ICD-10-CM | POA: Diagnosis not present

## 2022-12-19 DIAGNOSIS — M1A9XX0 Chronic gout, unspecified, without tophus (tophi): Secondary | ICD-10-CM

## 2022-12-19 DIAGNOSIS — E0590 Thyrotoxicosis, unspecified without thyrotoxic crisis or storm: Secondary | ICD-10-CM

## 2022-12-19 DIAGNOSIS — E785 Hyperlipidemia, unspecified: Secondary | ICD-10-CM

## 2022-12-19 DIAGNOSIS — E119 Type 2 diabetes mellitus without complications: Secondary | ICD-10-CM

## 2022-12-19 DIAGNOSIS — Z0000 Encounter for general adult medical examination without abnormal findings: Secondary | ICD-10-CM

## 2022-12-19 DIAGNOSIS — R1031 Right lower quadrant pain: Secondary | ICD-10-CM

## 2022-12-19 DIAGNOSIS — M059 Rheumatoid arthritis with rheumatoid factor, unspecified: Secondary | ICD-10-CM

## 2022-12-19 DIAGNOSIS — M549 Dorsalgia, unspecified: Secondary | ICD-10-CM

## 2022-12-19 LAB — TSH: TSH: 2.16 u[IU]/mL (ref 0.35–5.50)

## 2022-12-19 LAB — COMPREHENSIVE METABOLIC PANEL
ALT: 7 U/L (ref 0–35)
AST: 15 U/L (ref 0–37)
Albumin: 4.2 g/dL (ref 3.5–5.2)
Alkaline Phosphatase: 68 U/L (ref 39–117)
BUN: 26 mg/dL — ABNORMAL HIGH (ref 6–23)
CO2: 31 meq/L (ref 19–32)
Calcium: 9.8 mg/dL (ref 8.4–10.5)
Chloride: 100 meq/L (ref 96–112)
Creatinine, Ser: 1.38 mg/dL — ABNORMAL HIGH (ref 0.40–1.20)
GFR: 37.46 mL/min — ABNORMAL LOW (ref >60.00)
Glucose, Bld: 79 mg/dL (ref 70–99)
Potassium: 4.3 meq/L (ref 3.5–5.1)
Sodium: 140 meq/L (ref 135–145)
Total Bilirubin: 0.3 mg/dL (ref 0.2–1.2)
Total Protein: 7.5 g/dL (ref 6.0–8.3)

## 2022-12-19 LAB — MICROALBUMIN / CREATININE URINE RATIO
Creatinine,U: 58.2 mg/dL
Microalb Creat Ratio: 14.3 mg/g (ref 0.0–30.0)
Microalb, Ur: 8.3 mg/dL — ABNORMAL HIGH (ref 0.0–1.9)

## 2022-12-19 LAB — LIPID PANEL
Cholesterol: 126 mg/dL (ref 0–200)
HDL: 41.1 mg/dL (ref >39.00)
LDL Cholesterol: 65 mg/dL (ref 0–99)
NonHDL: 85.22
Total CHOL/HDL Ratio: 3
Triglycerides: 101 mg/dL (ref 0.0–149.0)
VLDL: 20.2 mg/dL (ref 0.0–40.0)

## 2022-12-19 LAB — CBC
HCT: 36.3 % (ref 36.0–46.0)
Hemoglobin: 11.4 g/dL — ABNORMAL LOW (ref 12.0–15.0)
MCHC: 31.4 g/dL (ref 30.0–36.0)
MCV: 72.3 fL — ABNORMAL LOW (ref 78.0–100.0)
Platelets: 373 10*3/uL (ref 150.0–400.0)
RBC: 5.02 Mil/uL (ref 3.87–5.11)
RDW: 17.5 % — ABNORMAL HIGH (ref 11.5–15.5)
WBC: 10.2 10*3/uL (ref 4.0–10.5)

## 2022-12-19 LAB — T4, FREE: Free T4: 0.69 ng/dL (ref 0.60–1.60)

## 2022-12-19 LAB — URIC ACID: Uric Acid, Serum: 9.1 mg/dL — ABNORMAL HIGH (ref 2.4–7.0)

## 2022-12-19 MED ORDER — TIZANIDINE HCL 2 MG PO TABS: 2.0000 mg | ORAL_TABLET | Freq: Four times a day (QID) | ORAL | 1 refills | Status: DC | PRN

## 2022-12-19 MED ORDER — DOCUSATE SODIUM 100 MG PO CAPS: 100.0000 mg | ORAL_CAPSULE | Freq: Two times a day (BID) | ORAL | 3 refills | Status: DC

## 2022-12-19 NOTE — Patient Instructions (Addendum)
We will check the labs today. We have sent in colace to take up to twice a day for the bowels. Start with 1 pill daily and then increase to 1 pill twice a day after 1-2 weeks if not helping.

## 2022-12-19 NOTE — Progress Notes (Unsigned)
Subjective:   Patient ID: Donna Booker, female    DOB: 1945-08-13, 77 y.o.   MRN: [id number]  HPI Here for medicare wellness, no new complaints Having some low sugars from time to time. Some constipation would like to try something. . Please see A/P for status and treatment of chronic medical problems.   Diet: DM since diabetic Physical activity: sedentary Depression/mood screen: negative Hearing: intact to whispered voice Visual acuity: grossly normal with lens, overdue annual eye exam  ADLs: capable Fall risk: none Home safety: good Cognitive evaluation: intact to orientation, naming, recall and repetition EOL planning: adv directives discussed, in place  [employer] Visit from 12/19/2022 in [facility] [place]  PHQ-2 Total Score 0       Flowsheet Row Office Visit from 12/19/2022 in [facility] [place]  PHQ-9 Total Score 0         03/07/2019   11:00 AM 02/28/2021   10:38 AM 05/02/2021    2:49 PM 08/15/2021    2:20 PM 12/19/2022    8:25 AM  Fall Risk  Falls in the past year? 0 0 0 0 0  Was there an injury with Fall?  0 0 0 0  Fall Risk Category Calculator  0 0 0 0  Fall Risk Category (Retired)  Low Low Low   (RETIRED) Patient Fall Risk Level  Low fall risk  Low fall risk   Patient at Risk for Falls Due to    No Fall Risks   Fall risk Follow up    Falls evaluation completed Falls evaluation completed    I have personally reviewed and have noted 1. The patient's medical and social history - reviewed today no changes 2. Their use of alcohol, tobacco or illicit drugs 3. Their current medications and supplements 4. The patient's functional ability including ADL's, fall risks, home safety risks and hearing or visual impairment. 5. Diet and physical activities 6. Evidence for depression or mood disorders 7. Care team reviewed and updated 8.  The patient is not on an opioid pain medication  Patient Care  Team: Myrlene Broker, MD as PCP - General (Internal Medicine) Szabat, Vinnie Level, [facility] (Inactive) (Pharmacist) Past Medical History:  Diagnosis Date   Afib (HCC)    Diabetes mellitus    Graves disease    Heart failure (HCC)    Hypertension    Hyperthyroidism    Past Surgical History:  Procedure Laterality Date   BREAST SURGERY     Family History  Problem Relation Age of Onset   Diabetes Mellitus II Mother    Diabetes Mellitus II Father    Hypertension Father    Hyperlipidemia Father    Heart disease Sister    Stroke Sister    Heart disease Brother    Diabetes Brother    Heart failure Brother     Review of Systems  Constitutional: Negative.   HENT: Negative.    Eyes: Negative.   Respiratory:  Negative for cough, chest tightness and shortness of breath.   Cardiovascular:  Negative for chest pain, palpitations and leg swelling.  Gastrointestinal:  Negative for abdominal distention, abdominal pain, constipation, diarrhea, nausea and vomiting.  Musculoskeletal:  Positive for arthralgias and back pain.  Skin: Negative.   Neurological: Negative.   Psychiatric/Behavioral: Negative.      Objective:  Physical Exam Constitutional:      Appearance: She is well-developed.  HENT:     Head: Normocephalic  and atraumatic.  Cardiovascular:     Rate and Rhythm: Normal rate and regular rhythm.  Pulmonary:     Effort: Pulmonary effort is normal. No respiratory distress.     Breath sounds: Normal breath sounds. No wheezing or rales.  Abdominal:     General: Bowel sounds are normal. There is no distension.     Palpations: Abdomen is soft.     Tenderness: There is no abdominal tenderness. There is no rebound.  Musculoskeletal:        General: Tenderness present.     Cervical back: Normal range of motion.  Skin:    General: Skin is warm and dry.  Neurological:     Mental Status: She is alert and oriented to person, place, and time.     Coordination: Coordination normal.      Vitals:   12/19/22 0813  BP: 126/80  Pulse: 78  Temp: 97.9 F (36.6 C)  TempSrc: Oral  SpO2: 98%  Weight: 184 lb (83.5 kg)  Height: 5\' 4"  (1.626 m)    Assessment & Plan:

## 2022-12-19 NOTE — Telephone Encounter (Signed)
Pt called back confirming with Dr. Okey Dupre to that she is taking allopurinol (ZYLOPRIM) 300 MG tablet. Please advise.

## 2022-12-20 MED ORDER — ALLOPURINOL 100 MG PO TABS: 100.0000 mg | ORAL_TABLET | Freq: Every day | ORAL | 3 refills | Status: DC

## 2022-12-20 NOTE — Telephone Encounter (Signed)
Sent in allopurinol 100 mg daily to add to her current 300 mg daily for total dose 400 mg daily. Recheck uric acid levels in 1-2 months, orders placed.

## 2022-12-21 DIAGNOSIS — N1832 Chronic kidney disease, stage 3b: Secondary | ICD-10-CM | POA: Insufficient documentation

## 2022-12-21 DIAGNOSIS — M1A9XX0 Chronic gout, unspecified, without tophus (tophi): Secondary | ICD-10-CM | POA: Insufficient documentation

## 2022-12-21 NOTE — Assessment & Plan Note (Signed)
From constipation and rx colace to help.

## 2022-12-21 NOTE — Assessment & Plan Note (Signed)
Checking lipid panel and adjust crestor as needed.  

## 2022-12-21 NOTE — Assessment & Plan Note (Signed)
Taking orencia and no flare today.

## 2022-12-21 NOTE — Assessment & Plan Note (Signed)
Checking TSH and free T4 and adjust as needed taking methimazole 2.5 mg daily.

## 2022-12-21 NOTE — Assessment & Plan Note (Signed)
Worsening overall and rx tizanidine 2 mg TID prn and this has helped in the past.

## 2022-12-21 NOTE — Assessment & Plan Note (Signed)
Rate controlled on metoprolol and eliquis. Sounds regular today no EKG done.

## 2022-12-21 NOTE — Assessment & Plan Note (Signed)
Checking uric acid and adjust allopurinol 300 mg daily as needed goal uric acid <6.

## 2022-12-21 NOTE — Assessment & Plan Note (Signed)
Flu shot up to date. Pneumonia complete. Shingrix complete. Tetanus due at pharmacy. Colonoscopy due declines. Mammogram due 2025, pap smear aged out and dexa complete. Counseled about sun safety and mole surveillance. Counseled about the dangers of distracted driving. Given 10 year screening recommendations.

## 2022-12-21 NOTE — Assessment & Plan Note (Signed)
Checking CMP and adjust as needed.  

## 2022-12-25 ENCOUNTER — Telehealth: Payer: Self-pay | Admitting: Cardiology

## 2022-12-25 MED ORDER — APIXABAN 2.5 MG PO TABS: 5.0000 mg | ORAL_TABLET | Freq: Two times a day (BID) | ORAL | 0 refills | Status: DC

## 2022-12-25 NOTE — Telephone Encounter (Signed)
We will not have any Eliquis samples until after the new Year, January 04, 2023. Thanks

## 2022-12-25 NOTE — Telephone Encounter (Signed)
Patient calling the office for samples of medication:   1.  What medication and dosage are you requesting samples for? apixaban (ELIQUIS) 5 MG TABS tablet   2.  Are you currently out of this medication? No - Only has enough to last to 12/30

## 2022-12-25 NOTE — Telephone Encounter (Signed)
Was able to get the pt samples of Eliquis 2.5 mg dose.  Pt will need to take 2 tablets twice daily to get the equivalent dose of this(pt on Eliquis 5 mg bid).   Was able to get 2 boxes/samples of the eliquis 2.5 mg's for the pt.  Called the pt and informed her that I will leave the samples at the front desk to pick up at her earliest convenience, and reminded her that this is Eliquis 2.5 mg dose and she will need to take 2 tablets twice daily to get her prescribed dose of eliquis 5 mg po bid.   Pt verbalized understanding and agrees with this plan.  Pt was more than gracious for all the assistance provided.

## 2022-12-25 NOTE — Telephone Encounter (Signed)
Malena Peer D, RPH-CPP  P [facility] Triage Caller: Unspecified (Today, 11:36 AM) We are out of Eliquis samples   Called the pt back and informed her that per our Pharmacist, as of today we are out of Eliquis samples.   Per our refill dept, Eliquis samples will not be available until next Monday 12/30.  Pt states she is good on samples until 12/30, but after that date she will need a couples days worth to get her through 01/04/23, when new plan kicks in and determines if eliquis will be covered at that time, or if she will need to switch to Xarelto (see telephone note about this on 12/05/22).  Advised the pt to check back in with our office at the end of this week or next Monday 12/30, to see if we have any samples to provide her at that time, to get her through until about 01/04/23 (when new insurance plan kicks in).   Advised her when she calls in, to ask for Margaret Pyle CMA or a triage nurse, to assist with this matter.  She is aware I will be out of the office after the middle of this week and all next week.   Pt verbalized understanding and agrees with this plan.   Will forward this call back to [facility] CMA in refills, to make her aware of this plan and to make her aware that pt will be asking for her at that time.

## 2023-01-08 ENCOUNTER — Other Ambulatory Visit (HOSPITAL_COMMUNITY): Payer: Self-pay

## 2023-01-08 ENCOUNTER — Telehealth: Payer: Self-pay | Admitting: Cardiology

## 2023-01-08 NOTE — Telephone Encounter (Signed)
 Pt c/o medication issue:  1. Name of Medication: apixaban  (ELIQUIS ) 2.5 MG TABS tablet   2. How are you currently taking this medication (dosage and times per day)?   3. Are you having a reaction (difficulty breathing--STAT)?   4. What is your medication issue? Patient is calling in regards to the cost of the medication. She would like to speak with Kayla?

## 2023-01-09 ENCOUNTER — Other Ambulatory Visit (HOSPITAL_COMMUNITY): Payer: Self-pay

## 2023-01-09 NOTE — Telephone Encounter (Signed)
 Spoke with patient regarding her Eliquis.

## 2023-01-11 ENCOUNTER — Telehealth: Payer: Self-pay | Admitting: Cardiology

## 2023-01-11 NOTE — Telephone Encounter (Signed)
Patient is asking that you give her a call back. Please advise

## 2023-01-11 NOTE — Telephone Encounter (Signed)
 Spoke with patient, she is looking to speak with Lynden Ang, CMA.

## 2023-01-11 NOTE — Telephone Encounter (Signed)
 Called pt back and pt wanted to let Aggie Hacker, LPN know that she decided to stay on Eliquis 5 mg tablets because they he gotten her cost down to $47 for a 30 day supply.  FYI

## 2023-01-15 ENCOUNTER — Encounter: Payer: Self-pay | Admitting: Internal Medicine

## 2023-01-15 ENCOUNTER — Ambulatory Visit (INDEPENDENT_AMBULATORY_CARE_PROVIDER_SITE_OTHER): Payer: Medicare Other | Admitting: Internal Medicine

## 2023-01-15 VITALS — BP 122/70 | HR 78 | Ht 64.0 in | Wt 185.6 lb

## 2023-01-15 DIAGNOSIS — E1169 Type 2 diabetes mellitus with other specified complication: Secondary | ICD-10-CM | POA: Diagnosis not present

## 2023-01-15 DIAGNOSIS — E0500 Thyrotoxicosis with diffuse goiter without thyrotoxic crisis or storm: Secondary | ICD-10-CM

## 2023-01-15 DIAGNOSIS — E1165 Type 2 diabetes mellitus with hyperglycemia: Secondary | ICD-10-CM | POA: Diagnosis not present

## 2023-01-15 DIAGNOSIS — Z794 Long term (current) use of insulin: Secondary | ICD-10-CM

## 2023-01-15 DIAGNOSIS — E785 Hyperlipidemia, unspecified: Secondary | ICD-10-CM

## 2023-01-15 LAB — POCT GLYCOSYLATED HEMOGLOBIN (HGB A1C): Hemoglobin A1C: 5.8 % — AB (ref 4.0–5.6)

## 2023-01-15 MED ORDER — GLIPIZIDE 5 MG PO TABS: ORAL_TABLET | ORAL | Status: DC

## 2023-01-15 NOTE — Patient Instructions (Addendum)
 Please continue: - Trulicity 3 mg weekly  Decrease: - Basaglar 15 units at night  Only use: - Glipizide 5 mg before a large dinner   Also, continue: - Methimazole 2.5 mg daily   Please return in 4 months with your sugar log.

## 2023-01-15 NOTE — Progress Notes (Signed)
 Patient ID: ACHAIA Booker, female   DOB: 01/15/1945, 78 y.o.   MRN: [id number]  HPI: Donna Booker is a 78 y.o.-year-old female, presenting for f/u for DM2, dx in 1987, insulin -dependent since 2006, uncontrolled, with complications (PN, stage 2-3 CKD)and Graves ds. Last visit 4 months ago.  Interim history: No increased urination, blurry vision, nausea, chest pain.   Graves ds  -Diagnosed during an admission for A. fib with RVR in 05/2017.  Reviewed her TFTs: Lab Results  Component Value Date   TSH 2.16 12/19/2022   TSH 1.15 05/08/2022   TSH 2.40 04/25/2021   TSH 2.90 10/15/2020   TSH 1.37 07/14/2020   TSH 3.94 06/28/2020   TSH 4.90 (H) 04/26/2020   TSH 5.06 (H) 02/26/2020   TSH 3.03 09/19/2019   TSH 0.46 06/05/2019  08/30/2022: TSH 2.12  Lab Results  Component Value Date   FREET4 0.69 12/19/2022   FREET4 0.77 05/08/2022   FREET4 0.67 04/25/2021   FREET4 0.81 10/15/2020   FREET4 1.30 07/14/2020   FREET4 0.79 06/28/2020   FREET4 0.71 04/26/2020   FREET4 0.67 02/26/2020   FREET4 0.61 09/19/2019   FREET4 0.63 06/05/2019   Lab Results  Component Value Date   T3FREE 2.9 05/08/2022   T3FREE 3.3 04/25/2021   T3FREE 3.2 10/15/2020   T3FREE 3.5 07/14/2020   T3FREE 3.4 06/28/2020   T3FREE 3.0 04/26/2020   T3FREE 3.0 02/26/2020   T3FREE 3.1 09/19/2019   T3FREE 3.2 06/05/2019   T3FREE 3.3 04/22/2019   Graves' antibodies were elevated Lab Results  Component Value Date   TSI 8.02 (H) 04/16/2017   She was started on methimazole  10 mg 2x a day and atenolol  50 mg 3x a day.Her TFTs were significantly improved >> we decreased the methimazole  to 5 mg 2X a day, but then on 5 mg daily (? When changed - by PCP? -Patient cannot remember exactly when she started her prescription for methimazole  only mentioned to take it once a day...).   12/2017: We stopped methimazole  as the TSH was 10.   02/2019: Patient finally return for labs: TSH was suppressed completely 03/2019: We  restarted methimazole  5 mg twice a day  03/2020: We decreased the methimazole  dose to 5 mg daily 04/2020: I advised her to decrease the methimazole  dose to 2.5 mg daily >> SHE FORGOT, so she continued on 5 mg daily 06/2020: Decreased MMI to 2.5 mg daily  She continues Toprol -XL.  Pt denies: - feeling nodules in neck - hoarseness - dysphagia - choking  No FH of thyroid  disease or thyroid  cancer. No h/o radiation tx to head or neck. No herbal supplements. No Biotin use. No recent steroids use.   DM2: Reviewed HbA1c levels: Lab Results  Component Value Date   HGBA1C 6.2 (A) 09/11/2022   HGBA1C 6.2 (A) 05/08/2022   HGBA1C 6.7 (A) 12/30/2021   HGBA1C 9.0 (A) 08/30/2021   HGBA1C 7.4 (A) 04/25/2021   HGBA1C 9.5 (A) 10/15/2020   HGBA1C 6.1 (A) 06/28/2020   HGBA1C 6.8 (H) 02/26/2020   HGBA1C 8.1 (A) 11/25/2019   HGBA1C 8.6 (A) 09/19/2019   HGBA1C 7.3 (A) 06/05/2019   HGBA1C 8.8 (H) 02/26/2019   HGBA1C 13.0 04/26/2018   HGBA1C 14.1 (H) 03/19/2018   HGBA1C 13.1 (A) 12/28/2017   HGBA1C 9.8 (H) 07/14/2017   HGBA1C 8.0 02/01/2016   HGBA1C 8.3 (H) 10/20/2015   HGBA1C 8.4 07/29/2015   HGBA1C 10.0 04/27/2015  08/30/2022: HbA1c 6.0% 10/15/2020:  HbA1c calculated from fructosamine is  much better, at 7.45%, correlating better with her blood sugars at home.   She is on: - Glipizide  10 mg 2x a day before meals >> 10 mg in a.m. and 5 mg in p.m. >> 5 mg in a.m. and 5 mg before a larger dinner  - Levemir  50 >> 40 >> Lantus  35 units at night >> Basaglar  30 >> 25 >> 20 units at night >1 mo ago - Trulicity  1.5 mg weekly - started 12/2018 >> 3 mg weekly  She was previously on a VGo patch pump.   She also tried Bydureon (02/2013-10/2014) >> made her hungry, did not lower sugars  She is not checking blood sugars 1x a day: - am: 67, 79-110, 120 >> 61-116, 127 >> 70, 75-101, 110, 115 - 2h after b'fast: 84, 95-143, 154 >> n/c >> 127, 130 >> n/c >> 85-107 - before lunch: n/c >> 137, 140 >> 150-200  >> n/c >> 88-99 - 2h after lunch: 136, 140 >> 340 >> n/c >> 123 >> 86-110, 135 - before dinner: 108, 191 >> n/c >> 110 >> 125-140 >> n/c - 2h after dinner:  n/c >> 194  >> 205 >> n/c >> 190 >> 88-126 - bedtime: 192, 265 >> 130-137 >> ? >> 207 >> n/c >> 119 - nighttime: n/c Lowest sugar was 48 ... >> 61 >> 51; she has hypoglycemia awareness in the 70s. Highest sugar was  270 (off Trulicity ) >> 147 >> 190 >> 135.  Glucometer: AccuChek  -+ CKD, last BUN/creatinine:  Lab Results  Component Value Date   BUN 26 (H) 12/19/2022   CREATININE 1.38 (H) 12/19/2022  08/30/2022: 20/1.3, GFR 43, glucose 51  Lab Results  Component Value Date   MICRALBCREAT 14.3 12/19/2022   MICRALBCREAT 35.4 (H) 02/28/2021   MICRALBCREAT 110.0 (H) 09/19/2019   MICRALBCREAT 192.6 (H) 06/05/2019  Previously on olmesartan , now off (?)  -+ HL; last set of lipids: Lab Results  Component Value Date   CHOL 126 12/19/2022   HDL 41.10 12/19/2022   LDLCALC 65 12/19/2022   LDLDIRECT 98.0 03/19/2018   TRIG 101.0 12/19/2022   CHOLHDL 3 12/19/2022  08/30/2022: 123/119/43/60 On Crestor  10.  - last eye exam was in 2023: No DR reportedly.   - no numbness and tingling in her feet.  Latest foot exam was 10/04/22.  Her son died in Jul 01, 2017-he had severe heart failure.  ROS: + See HPI  I reviewed pt's medications, allergies, PMH, social hx, family hx, and changes were documented in the history of present illness. Otherwise, unchanged from my initial visit note.  Past Medical History:  Diagnosis Date   Afib (HCC)    Diabetes mellitus    Graves disease    Heart failure (HCC)    Hypertension    Hyperthyroidism    Past Surgical History:  Procedure Laterality Date   BREAST SURGERY     Social History   Social History   Marital Status: Married    Spouse Name: N/A   Number of Children: 1   Occupational History   Customer svc rep   Social History Main Topics   Smoking status: Never Smoker    Smokeless  tobacco: Not on file   Alcohol Use: No   Drug Use: No   Current Outpatient Medications on File Prior to Visit  Medication Sig Dispense Refill   Abatacept  (ORENCIA  [place]) Inject into the skin every 30 (thirty) days. (Patient not taking: Reported on 12/19/2022)     albuterol  (  VENTOLIN  HFA) 108 (90 Base) MCG/ACT inhaler Inhale 2 puffs into the lungs every 6 (six) hours as needed for wheezing or shortness of breath. 8 g 2   allopurinol  (ZYLOPRIM ) 100 MG tablet Take 1 tablet (100 mg total) by mouth daily. Take with 300 for total dose 400 mg 90 tablet 3   allopurinol  (ZYLOPRIM ) 300 MG tablet TAKE 1 TABLET BY MOUTH EVERY DAY 90 tablet 1   amLODipine  (NORVASC ) 10 MG tablet TAKE 1 TABLET BY MOUTH EVERY DAY 90 tablet 3   apixaban  (ELIQUIS ) 2.5 MG TABS tablet Take 2 tablets (5 mg total) by mouth 2 (two) times daily. 28 tablet 0   apixaban  (ELIQUIS ) 5 MG TABS tablet Take 1 tablet (5 mg total) by mouth 2 (two) times daily. 180 tablet 3   docusate sodium  (COLACE) 100 MG capsule Take 1 capsule (100 mg total) by mouth 2 (two) times daily. 180 capsule 3   Dulaglutide  (TRULICITY ) 3 MG/0.5ML SOAJ INJECT 3 MG (0.5 ML) UNDER THE SKIN ONCE A WEEK 6 mL 3   esomeprazole (NEXIUM) 40 MG capsule Take 40 mg by mouth daily before breakfast.     furosemide  (LASIX ) 20 MG tablet TAKE 2 TABLETS BY MOUTH TWICE A DAY 360 tablet 1   gabapentin  (NEURONTIN ) 300 MG capsule Take 1-2 capsules (300-600 mg total) by mouth 3 (three) times daily. 150 capsule 5   glipiZIDE  (GLUCOTROL ) 5 MG tablet Take 1 tablet (5 mg total) by mouth 2 (two) times daily before a meal. 180 tablet 3   hydrALAZINE  (APRESOLINE ) 25 MG tablet TAKE 1 TABLET BY MOUTH THREE TIMES A DAY 270 tablet 1   Insulin  Glargine (BASAGLAR  KWIKPEN) 100 UNIT/ML Inject 25 Units into the skin daily.     isosorbide  mononitrate (IMDUR ) 30 MG 24 hr tablet TAKE 1 TABLET BY MOUTH EVERY DAY 90 tablet 3   methimazole  (TAPAZOLE ) 5 MG tablet Take 0.5 tablets (2.5 mg total) by mouth daily. 45  tablet 3   metoprolol  succinate (TOPROL -XL) 100 MG 24 hr tablet TAKE 1 TABLET BY MOUTH EVERY DAY WITH OR IMMEDIATELY FOLLOWING A MEAL 90 tablet 2   ondansetron  (ZOFRAN ) 4 MG tablet Take 1 tablet (4 mg total) by mouth every 8 (eight) hours as needed for nausea or vomiting. 20 tablet 0   rosuvastatin  (CRESTOR ) 20 MG tablet Take 1 tablet (20 mg total) by mouth daily. 90 tablet 3   tiZANidine  (ZANAFLEX ) 2 MG tablet Take 1 tablet (2 mg total) by mouth every 6 (six) hours as needed for muscle spasms. 60 tablet 1   VOLTAREN  1 % GEL Apply 2 g topically 4 (four) times daily. (Patient taking differently: Apply 2 g topically as needed.) 100 g 3   No current facility-administered medications on file prior to visit.   Allergies  Allergen Reactions   Ibuprofen    Tramadol  Nausea And Vomiting    FH - see HPI  PE: BP 122/70   Pulse 78   Ht 5' 4 (1.626 m)   Wt 185 lb 9.6 oz (84.2 kg)   SpO2 97%   BMI 31.86 kg/m  Wt Readings from Last 10 Encounters:  01/15/23 185 lb 9.6 oz (84.2 kg)  12/19/22 184 lb (83.5 kg)  10/23/22 193 lb 12.8 oz (87.9 kg)  09/11/22 194 lb 6.4 oz (88.2 kg)  05/08/22 193 lb 12.8 oz (87.9 kg)  04/24/22 197 lb (89.4 kg)  04/20/22 191 lb (86.6 kg)  03/03/22 204 lb 6.4 oz (92.7 kg)  02/03/22 201 lb (91.2  kg)  12/30/21 206 lb 6.4 oz (93.6 kg)   Constitutional: overweight, in NAD.  She walks with a cane. Eyes:  EOMI, no exophthalmos ENT: no neck masses, no cervical lymphadenopathy Cardiovascular: RRR, No MRG Respiratory: CTA B Musculoskeletal: no deformities Skin:no rashes Neurological: no tremor with outstretched hands  ASSESSMENT: 1. 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  Graves' disease  3.  HL  We previously also addressed her hyperkalemia: - in 07/2017, she had a critical value of potassium of 7.5.  She was admitted with high potassium since then >> Lasix , potassium supplementation, and lisinopril  were stopped. - A  cosyntropin  stimulation test performed in the hospital was normal, ruling out primary adrenal insufficiency as a cause for hyperkalemia - Aldosterone and renin levels were normal during her last hospitalization - On her medication list, I do not see any possible culprits,  other than possibly propranolol  - I suggested a referral to nephrology  if her potassium continues to remain elevated after stopping ACE inhibitor and potassium supplementation -The potassium normalized afterwards  PLAN:  1. Patient with longstanding, previously uncontrolled type 2 diabetes, with improvement in control after adding a GLP-1 receptor agonist.  She is on Basaglar  and sulfonylurea, also.  At last visit, HbA1c obtained from [facility] was 6.0%, decreased (08/2022).  She did have a low blood sugar, at 51 on regular labs prior to our visit and upon questioning, she did not follow the recommendation to decrease her glipizide  in the morning from 10 mg to 5 mg daily and only take glipizide  5 mg before a larger dinner.  Therefore, she was taking more sulfonylurea than recommended.  I advised her to reduce the dose and we also reduced the Basaglar  dose at that time. -Today's visit, sugars are excellent, all fluctuating at the lower limit of the target range.  She did decrease the dose of Basaglar  further since last visit we need to reduce it even more.  Will also stop the glipizide  in the morning and continue only with prn Glipizide  before larger meals (she ends up taking this approximately twice a month). - I suggested to:  Patient Instructions  Please continue: - Trulicity  3 mg weekly  Decrease: - Basaglar  15 units at night  Only use: - Glipizide  5 mg before a large dinner   Also, continue: - Methimazole  2.5 mg daily   Please return in 4 months with your sugar log.   - we checked her HbA1c: 5.8% (lower) - advised to check sugars at different times of the day - 1-2x a day, rotating check times - advised for yearly  eye exams >> she is UTD - return to clinic in 4 months  2.  Graves' disease -Her hyperthyroidism was diagnosed after she was admitted with A-fib with RVR and acute pulmonary edema in 2019 -She likely has Graves' disease based on elevated TSI's and clinical course -We started methimazole  initially, however, we then had to stop this completely after TSH increased to 10 and then restarted it in 03/2019 at 5 mg twice a day as the TSH returned suppressed.  After, we were able to decrease the dose down to 2.5 mg daily and we continued the same dose at last visit. -Latest TSH was normal 12/2022 -She denies hyperthyroid symptoms including palpitations, tremors, heat intolerance.  She lost 9 pounds since last visit.  She does mention a decreased appetite, as expected with Trulicity .  3. HL -Reviewed the latest lipid panel from 12/2022:  Fractions at goal: Lab Results  Component Value Date   CHOL 126 12/19/2022   HDL 41.10 12/19/2022   LDLCALC 65 12/19/2022   LDLDIRECT 98.0 03/19/2018   TRIG 101.0 12/19/2022   CHOLHDL 3 12/19/2022  -She continues Crestor  10 mg daily without side effects  Lela Fendt, MD PhD [facility]

## 2023-01-16 ENCOUNTER — Telehealth: Payer: Self-pay | Admitting: Cardiology

## 2023-01-16 DIAGNOSIS — R600 Localized edema: Secondary | ICD-10-CM

## 2023-01-16 MED ORDER — HYDRALAZINE HCL 25 MG PO TABS: 25.0000 mg | ORAL_TABLET | Freq: Three times a day (TID) | ORAL | 1 refills | Status: DC

## 2023-01-16 NOTE — Telephone Encounter (Signed)
*  STAT* If patient is at the pharmacy, call can be transferred to refill team.   1. Which medications need to be refilled? (please list name of each medication and dose if known) Hydralazine    2. Would you like to learn more about the convenience, safety, & potential cost savings by using the [facility]?     3. Are you open to using the [facility] (Type [facility].   4. Which pharmacy/location (including street and city if local pharmacy) is medication to be sent to? [address], [address] [place], [place] phone# [phone]   5. Do they need a 30 day or 90 day supply? 90 days and refills- please call today- out of medicine

## 2023-01-18 ENCOUNTER — Other Ambulatory Visit: Payer: Self-pay

## 2023-01-18 ENCOUNTER — Telehealth: Payer: Self-pay | Admitting: Cardiology

## 2023-01-18 DIAGNOSIS — R600 Localized edema: Secondary | ICD-10-CM

## 2023-01-18 MED ORDER — HYDRALAZINE HCL 25 MG PO TABS: 25.0000 mg | ORAL_TABLET | Freq: Three times a day (TID) | ORAL | 2 refills | Status: DC

## 2023-01-18 NOTE — Telephone Encounter (Signed)
*  STAT* If patient is at the pharmacy, call can be transferred to refill team.   1. Which medications need to be refilled? (please list name of each medication and dose if known) hydrALAZINE (APRESOLINE) 25 MG tablet    2. Would you like to learn more about the convenience, safety, & potential cost savings by using the [facility]? No  3. Are you open to using the [facility] ([facility]. ) No   4. Which pharmacy/location (including street and city if local pharmacy) is medication to be sent to?[facility] - [place], [place] - [address]    5. Do they need a 30 day or 90 day supply? 90 day  Prior refill request sent to wrong pharmacy

## 2023-02-05 ENCOUNTER — Other Ambulatory Visit: Payer: Self-pay | Admitting: Cardiology

## 2023-02-05 DIAGNOSIS — R0609 Other forms of dyspnea: Secondary | ICD-10-CM

## 2023-05-14 ENCOUNTER — Ambulatory Visit (INDEPENDENT_AMBULATORY_CARE_PROVIDER_SITE_OTHER): Payer: Medicare Other | Admitting: Internal Medicine

## 2023-05-14 ENCOUNTER — Encounter: Payer: Self-pay | Admitting: Internal Medicine

## 2023-05-14 VITALS — BP 120/70 | HR 71 | Ht 64.0 in | Wt 188.6 lb

## 2023-05-14 DIAGNOSIS — Z7985 Long-term (current) use of injectable non-insulin antidiabetic drugs: Secondary | ICD-10-CM | POA: Diagnosis not present

## 2023-05-14 DIAGNOSIS — E1169 Type 2 diabetes mellitus with other specified complication: Secondary | ICD-10-CM

## 2023-05-14 DIAGNOSIS — Z794 Long term (current) use of insulin: Secondary | ICD-10-CM | POA: Diagnosis not present

## 2023-05-14 DIAGNOSIS — E785 Hyperlipidemia, unspecified: Secondary | ICD-10-CM | POA: Diagnosis not present

## 2023-05-14 DIAGNOSIS — Z7984 Long term (current) use of oral hypoglycemic drugs: Secondary | ICD-10-CM | POA: Diagnosis not present

## 2023-05-14 DIAGNOSIS — Z8639 Personal history of other endocrine, nutritional and metabolic disease: Secondary | ICD-10-CM

## 2023-05-14 DIAGNOSIS — E118 Type 2 diabetes mellitus with unspecified complications: Secondary | ICD-10-CM | POA: Diagnosis not present

## 2023-05-14 LAB — POCT GLYCOSYLATED HEMOGLOBIN (HGB A1C): Hemoglobin A1C: 6 % — AB (ref 4.0–5.6)

## 2023-05-14 MED ORDER — BASAGLAR KWIKPEN 100 UNIT/ML ~~LOC~~ SOPN: 16.0000 [IU] | PEN_INJECTOR | Freq: Every day | SUBCUTANEOUS | 3 refills | Status: DC

## 2023-05-14 MED ORDER — TRULICITY 3 MG/0.5ML ~~LOC~~ SOAJ
3.0000 mg | SUBCUTANEOUS | 3 refills | Status: AC
Start: 2023-05-14 — End: ?

## 2023-05-14 NOTE — Progress Notes (Signed)
 Patient ID: Donna Booker, female   DOB: 03-12-1945, 78 y.o.   MRN: [id number]  HPI: Donna Booker is a 78 y.o.-year-old female, presenting for f/u for DM2, dx in 1987, insulin -dependent since 2006, uncontrolled, with complications (PN, stage 2-3 CKD)and Graves ds. Last visit 4 months ago.  Interim history: No increased urination, blurry vision, nausea, chest pain.  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 She developed a rash on R side of her abdomen - she believes that this started after a bubble bath. She using a steroid cream. Itching resolved.  Graves ds  -Diagnosed during an admission for A. fib with RVR in 05/2017.  Reviewed her TFTs: Lab Results  Component Value Date   TSH 2.16 12/19/2022   TSH 1.15 05/08/2022   TSH 2.40 04/25/2021   TSH 2.90 10/15/2020   TSH 1.37 07/14/2020   TSH 3.94 06/28/2020   TSH 4.90 (H) 04/26/2020   TSH 5.06 (H) 02/26/2020   TSH 3.03 09/19/2019   TSH 0.46 06/05/2019  08/30/2022: TSH 2.12  Lab Results  Component Value Date   FREET4 0.69 12/19/2022   FREET4 0.77 05/08/2022   FREET4 0.67 04/25/2021   FREET4 0.81 10/15/2020   FREET4 1.30 07/14/2020   FREET4 0.79 06/28/2020   FREET4 0.71 04/26/2020   FREET4 0.67 02/26/2020   FREET4 0.61 09/19/2019   FREET4 0.63 06/05/2019   Lab Results  Component Value Date   T3FREE 2.9 05/08/2022   T3FREE 3.3 04/25/2021   T3FREE 3.2 10/15/2020   T3FREE 3.5 07/14/2020   T3FREE 3.4 06/28/2020   T3FREE 3.0 04/26/2020   T3FREE 3.0 02/26/2020   T3FREE 3.1 09/19/2019   T3FREE 3.2 06/05/2019   T3FREE 3.3 04/22/2019   Graves' antibodies were elevated Lab Results  Component Value Date   TSI 8.02 (H) 04/16/2017   She was started on methimazole  10 mg 2x a day and atenolol  50 mg 3x a day.Her TFTs were significantly improved >> we decreased the methimazole  to 5 mg 2X a day, but then on 5 mg daily (? When changed - by PCP? -Patient cannot remember exactly when she started her  prescription for methimazole  only mentioned to take it once a day...).   12/2017: We stopped methimazole  as the TSH was 10.   02/2019: Patient finally return for labs: TSH was suppressed completely 03/2019: We restarted methimazole  5 mg twice a day  03/2020: We decreased the methimazole  dose to 5 mg daily 04/2020: I advised her to decrease the methimazole  dose to 2.5 mg daily >> SHE FORGOT, so she continued on 5 mg daily 06/2020: Decreased MMI to 2.5 mg daily,  She continues Toprol -XL.  Pt denies: - feeling nodules in neck - hoarseness - dysphagia - choking  No FH of thyroid  disease or thyroid  cancer. No h/o radiation tx to head or neck. No herbal supplements. No Biotin use. No recent steroids use.   DM2: Reviewed HbA1c levels: Lab Results  Component Value Date   HGBA1C 5.8 (A) 01/15/2023   HGBA1C 6.2 (A) 09/11/2022   HGBA1C 6.2 (A) 05/08/2022   HGBA1C 6.7 (A) 12/30/2021   HGBA1C 9.0 (A) 08/30/2021   HGBA1C 7.4 (A) 04/25/2021   HGBA1C 9.5 (A) 10/15/2020   HGBA1C 6.1 (A) 06/28/2020   HGBA1C 6.8 (H) 02/26/2020   HGBA1C 8.1 (A) 11/25/2019   HGBA1C 8.6 (A) 09/19/2019   HGBA1C 7.3 (A) 06/05/2019   HGBA1C 8.8 (H) 02/26/2019   HGBA1C 13.0 04/26/2018   HGBA1C  14.1 (H) 03/19/2018   HGBA1C 13.1 (A) 12/28/2017   HGBA1C 9.8 (H) 07/14/2017   HGBA1C 8.0 02/01/2016   HGBA1C 8.3 (H) 10/20/2015   HGBA1C 8.4 07/29/2015  08/30/2022: HbA1c 6.0% 10/15/2020:  HbA1c calculated from fructosamine is much better, at 7.45%, correlating better with her blood sugars at home.   She is on: - Glipizide  10 mg 2x a day before meals >> 10 mg in a.m. and 5 mg in p.m. >> 5 mg in a.m. and 5 mg before a larger dinner >> 5 mg before a larger dinner - Basaglar  30 >> 25 >> 20 >> 15 (forgot) >> 20 units at night  - Trulicity  1.5 mg weekly - started 12/2018 >> 3 mg weekly  She was previously on a VGo patch pump.   She also tried Bydureon (02/2013-10/2014) >> made her hungry, did not lower her sugars. She was  previously on Levemir  (initially 50 units daily) and Lantus .  She is not checking blood sugars 1x a day: - am:  61-116, 127 >> 70, 75-101, 110, 115 >> 85-100 - 2h after b'fast:   n/c >> 127, 130 >> n/c >> 85-107 >> 86-131 - before lunch: 137, 140 >> 150-200 >> n/c >> 88-99 >> 115, 128 - 2h after lunch: 340 >> n/c >> 123 >> 86-110, 135 >> 119-130 - before dinner: 108, 191 >> n/c >> 110 >> 125-140 >> n/c - 2h after dinner: 205 >> n/c >> 190 >> 88-126 >> 130-175 - bedtime:130-137 >> ? >> 207 >> n/c >> 119 >> n/c - nighttime: n/c Lowest sugar was 48 ... >> 61 >> 51 >> 85; she has hypoglycemia awareness in the 70s. Highest sugar was  270 (off Trulicity ) >> ... 135 >> 175.  Glucometer: AccuChek  -+ CKD - seeing nephrology, last BUN/creatinine:  Lab Results  Component Value Date   BUN 26 (H) 12/19/2022   CREATININE 1.38 (H) 12/19/2022  08/30/2022: 20/1.3, GFR 43, glucose 51  Lab Results  Component Value Date   MICRALBCREAT 14.3 12/19/2022   MICRALBCREAT 35.4 (H) 02/28/2021   MICRALBCREAT 110.0 (H) 09/19/2019   MICRALBCREAT 192.6 (H) 06/05/2019  Previously on olmesartan , now off (?)  -+ HL; last set of lipids: Lab Results  Component Value Date   CHOL 126 12/19/2022   HDL 41.10 12/19/2022   LDLCALC 65 12/19/2022   LDLDIRECT 98.0 03/19/2018   TRIG 101.0 12/19/2022   CHOLHDL 3 12/19/2022  On Crestor  20.  - last eye exam was in 2023: No DR reportedly. Coming up in 07/16/23.  - no numbness and tingling in her feet.  Latest foot exam was 10-07-2022.  Her son died in Jul 15, 2017-he had severe heart failure.  ROS: + See HPI  I reviewed pt's medications, allergies, PMH, social hx, family hx, and changes were documented in the history of present illness. Otherwise, unchanged from my initial visit note.  Past Medical History:  Diagnosis Date   Afib (HCC)    Diabetes mellitus    Graves disease    Heart failure (HCC)    Hypertension    Hyperthyroidism    Past Surgical History:   Procedure Laterality Date   BREAST SURGERY     Social History   Social History   Marital Status: Married    Spouse Name: N/A   Number of Children: 1   Occupational History   Customer svc rep   Social History Main Topics   Smoking status: Never Smoker    Smokeless tobacco: Not on  file   Alcohol Use: No   Drug Use: No   Current Outpatient Medications on File Prior to Visit  Medication Sig Dispense Refill   Abatacept  (ORENCIA  [place]) Inject into the skin every 30 (thirty) days. (Patient not taking: Reported on 01/15/2023)     albuterol  (VENTOLIN  HFA) 108 (90 Base) MCG/ACT inhaler Inhale 2 puffs into the lungs every 6 (six) hours as needed for wheezing or shortness of breath. 8 g 2   allopurinol  (ZYLOPRIM ) 100 MG tablet Take 1 tablet (100 mg total) by mouth daily. Take with 300 for total dose 400 mg (Patient not taking: Reported on 01/15/2023) 90 tablet 3   allopurinol  (ZYLOPRIM ) 300 MG tablet TAKE 1 TABLET BY MOUTH EVERY DAY 90 tablet 1   amLODipine  (NORVASC ) 10 MG tablet TAKE 1 TABLET BY MOUTH EVERY DAY 90 tablet 3   apixaban  (ELIQUIS ) 2.5 MG TABS tablet Take 2 tablets (5 mg total) by mouth 2 (two) times daily. 28 tablet 0   apixaban  (ELIQUIS ) 5 MG TABS tablet Take 1 tablet (5 mg total) by mouth 2 (two) times daily. 180 tablet 3   docusate sodium  (COLACE) 100 MG capsule Take 1 capsule (100 mg total) by mouth 2 (two) times daily. 180 capsule 3   Dulaglutide  (TRULICITY ) 3 MG/0.5ML SOAJ INJECT 3 MG (0.5 ML) UNDER THE SKIN ONCE A WEEK 6 mL 3   esomeprazole (NEXIUM) 40 MG capsule Take 40 mg by mouth daily before breakfast.     furosemide  (LASIX ) 20 MG tablet TAKE 2 TABLETS BY MOUTH TWICE A DAY 360 tablet 1   gabapentin  (NEURONTIN ) 300 MG capsule Take 1-2 capsules (300-600 mg total) by mouth 3 (three) times daily. 150 capsule 5   glipiZIDE  (GLUCOTROL ) 5 MG tablet Take 1 tablet by mouth before larger dinners     hydrALAZINE  (APRESOLINE ) 25 MG tablet Take 1 tablet (25 mg total) by mouth 3 (three)  times daily. 270 tablet 2   Insulin  Glargine (BASAGLAR  KWIKPEN) 100 UNIT/ML Inject 15 Units into the skin daily.     isosorbide  mononitrate (IMDUR ) 30 MG 24 hr tablet Take 1 tablet by mouth once daily 90 tablet 2   methimazole  (TAPAZOLE ) 5 MG tablet Take 0.5 tablets (2.5 mg total) by mouth daily. 45 tablet 3   metoprolol  succinate (TOPROL -XL) 100 MG 24 hr tablet TAKE 1 TABLET BY MOUTH EVERY DAY WITH OR IMMEDIATELY FOLLOWING A MEAL 90 tablet 2   ondansetron  (ZOFRAN ) 4 MG tablet Take 1 tablet (4 mg total) by mouth every 8 (eight) hours as needed for nausea or vomiting. 20 tablet 0   rosuvastatin  (CRESTOR ) 20 MG tablet Take 1 tablet (20 mg total) by mouth daily. 90 tablet 3   tiZANidine  (ZANAFLEX ) 2 MG tablet Take 1 tablet (2 mg total) by mouth every 6 (six) hours as needed for muscle spasms. 60 tablet 1   VOLTAREN  1 % GEL Apply 2 g topically 4 (four) times daily. (Patient taking differently: Apply 2 g topically as needed.) 100 g 3   No current facility-administered medications on file prior to visit.   Allergies  Allergen Reactions   Ibuprofen    Tramadol  Nausea And Vomiting    FH - see HPI  PE: BP 120/70   Pulse 71   Ht 5\' 4"  (1.626 m)   Wt 188 lb 9.6 oz (85.5 kg)   SpO2 98%   BMI 32.37 kg/m  Wt Readings from Last 10 Encounters:  05/14/23 188 lb 9.6 oz (85.5 kg)  01/15/23 185 lb  9.6 oz (84.2 kg)  12/19/22 184 lb (83.5 kg)  10/23/22 193 lb 12.8 oz (87.9 kg)  09/11/22 194 lb 6.4 oz (88.2 kg)  05/08/22 193 lb 12.8 oz (87.9 kg)  04/24/22 197 lb (89.4 kg)  04/20/22 191 lb (86.6 kg)  03/03/22 204 lb 6.4 oz (92.7 kg)  02/03/22 201 lb (91.2 kg)   Constitutional: overweight, in NAD.  She walks with a cane. Eyes:  EOMI, no exophthalmos ENT: no neck masses, no cervical lymphadenopathy Cardiovascular: RRR, No MRG Respiratory: CTA B Musculoskeletal: no deformities Skin:no rashes Neurological: no tremor with outstretched hands  ASSESSMENT: 1. DM2, insulin -dependent, uncontrolled,  with complications - PN - CKD stage 2-3  2.  Graves' disease  3.  HL  We previously also addressed her hyperkalemia: - in 07/2017, she had a critical value of potassium of 7.5.  She was admitted with high potassium since then >> Lasix , potassium supplementation, and lisinopril  were stopped. - A cosyntropin  stimulation test performed in the hospital was normal, ruling out primary adrenal insufficiency as a cause for hyperkalemia - Aldosterone and renin levels were normal during her last hospitalization - On her medication list, I do not see any possible culprits,  other than possibly propranolol  - I suggested a referral to nephrology  if her potassium continues to remain elevated after stopping ACE inhibitor and potassium supplementation -The potassium normalized afterwards  PLAN:  1. Patient with longstanding, previously uncontrolled type 2 diabetes, with improvement in control after adding a GLP-1 receptor agonist.  She continues on insulin  and now as needed sulfonylurea we stopped glipizide  in the morning.  At last visit, HbA1c was lower, at 5.8%, decreased from 6.0%.  Sugars were excellent, all fluctuating within the lower half of the target range so I advised her to decrease her Basaglar  dose and only take the glipizide  as needed before a larger dinner. - At today's visit, sugars are excellent.  However, upon questioning, she is, but she was reported morning glipizide  as she only takes glipizide  before a larger meal.  No lows anymore, but most of her blood sugars in the morning are between 80s and 90s.  We can decrease the Basaglar  dose a little more.  Refilled Trulicity  and Insulin  - I suggested to:  Patient Instructions  Please continue: - Trulicity  3 mg weekly - Glipizide  5 mg before a large dinner  Decrease: - Basaglar  16 units at night   Also, continue: - Methimazole  2.5 mg daily  Please stop at the lab.   Please return in 4 months with your sugar log (before 09/11/2023).    - we checked her HbA1c: 6.0% (slightly higher) - advised to check sugars at different times of the day - 1x a day, rotating check times - advised for yearly eye exams >> she is UTD - return to clinic in 4 months  2.  Graves' disease -Her hyperthyroidism was diagnosed after she was admitted with A-fib with RVR and acute pulmonary edema in 2019 - She likely has Graves' disease based on elevated TSI's and clinical course -We started methimazole  initially, however, we then had to stop this completely after TSH increased to 10 and then restarted it in 03/2019 at 5 mg twice a day as the TSH returned suppressed.  Afterwards, we were able to decrease the dose down to 2.5 mg daily, which she continues today - Latest TSH was normal 12/2022 -No hyperthyroid symptoms including palpitations, tremors, heat intolerance.  Before last visit, she lost 9 pounds, as expected, on  Trulicity .  3. HL - Reviewed latest lipid panel from 12/2022: All fractions at goal Lab Results  Component Value Date   CHOL 126 12/19/2022   HDL 41.10 12/19/2022   LDLCALC 65 12/19/2022   LDLDIRECT 98.0 03/19/2018   TRIG 101.0 12/19/2022   CHOLHDL 3 12/19/2022  - She continues Crestor  20 mg daily without side effect  Orders Placed This Encounter  Procedures   TSH   T4, free   T3, free   Needs refills of MMI.  Emilie Harden, MD PhD [facility] End[facility]

## 2023-05-14 NOTE — Patient Instructions (Addendum)
 Please continue: - Trulicity  3 mg weekly - Glipizide  5 mg before a large dinner  Decrease: - Basaglar  16 units at night   Also, continue: - Methimazole  2.5 mg daily  Please stop at the lab.   Please return in 4 months with your sugar log (before 09/11/2023).

## 2023-05-14 NOTE — Addendum Note (Signed)
 Addended by: Vernon Goodpasture on: 05/14/2023 12:33 PM   Modules accepted: Orders

## 2023-05-15 ENCOUNTER — Other Ambulatory Visit (HOSPITAL_COMMUNITY): Payer: Self-pay

## 2023-05-15 ENCOUNTER — Telehealth: Payer: Self-pay

## 2023-05-15 NOTE — Telephone Encounter (Signed)
 Pharmacy Patient Advocate Encounter   Received notification from CoverMyMeds that prior authorization for Trulicity  is required/requested.   Insurance verification completed.   The patient is insured through [facility] .   Per test claim: PA required; PA submitted to above mentioned insurance via CoverMyMeds Key/confirmation #/EOC [id number] Status is pending

## 2023-05-17 ENCOUNTER — Telehealth: Payer: Self-pay | Admitting: Cardiology

## 2023-05-17 DIAGNOSIS — I10 Essential (primary) hypertension: Secondary | ICD-10-CM

## 2023-05-17 MED ORDER — AMLODIPINE BESYLATE 10 MG PO TABS: 10.0000 mg | ORAL_TABLET | Freq: Every day | ORAL | 1 refills | Status: DC

## 2023-05-17 NOTE — Telephone Encounter (Signed)
*  STAT* If patient is at the pharmacy, call can be transferred to refill team.   1. Which medications need to be refilled? (please list name of each medication and dose if known) amLODipine  (NORVASC ) 10 MG tablet    2. Would you like to learn more about the convenience, safety, & potential cost savings by using the [facility]?     3. Are you open to using the [facility] (Type [facility].  ).   4. Which pharmacy/location (including street and city if local pharmacy) is medication to be sent to? [facility] - [place], [place] - [address]    5. Do they need a 30 day or 90 day supply? 90 day

## 2023-05-17 NOTE — Telephone Encounter (Signed)
 Pt's medication was sent to pt's pharmacy as requested. Confirmation received.

## 2023-05-24 NOTE — Telephone Encounter (Signed)
 Pharmacy Patient Advocate Encounter  Received notification from [employer] that Prior Authorization for Trulicity  has been APPROVED from 05/15/23 to 01/02/2024   PA #/Case ID/Reference #:  [id number]

## 2023-05-25 NOTE — Telephone Encounter (Signed)
 Pt has been notified and voices understanding.

## 2023-06-04 ENCOUNTER — Other Ambulatory Visit: Payer: Self-pay | Admitting: Cardiology

## 2023-06-04 DIAGNOSIS — R600 Localized edema: Secondary | ICD-10-CM

## 2023-06-07 ENCOUNTER — Other Ambulatory Visit

## 2023-06-08 ENCOUNTER — Ambulatory Visit: Payer: Self-pay | Admitting: Internal Medicine

## 2023-06-08 LAB — TSH: TSH: 1.59 m[IU]/L (ref 0.40–4.50)

## 2023-06-08 LAB — T4, FREE: Free T4: 0.9 ng/dL (ref 0.8–1.8)

## 2023-06-08 LAB — T3, FREE: T3, Free: 2.9 pg/mL (ref 2.3–4.2)

## 2023-07-08 ENCOUNTER — Other Ambulatory Visit: Payer: Self-pay | Admitting: Internal Medicine

## 2023-07-09 ENCOUNTER — Encounter: Payer: Self-pay | Admitting: Internal Medicine

## 2023-07-09 ENCOUNTER — Ambulatory Visit: Admitting: Internal Medicine

## 2023-07-09 VITALS — BP 138/80 | HR 71 | Temp 98.1°F | Ht 64.0 in | Wt 190.0 lb

## 2023-07-09 DIAGNOSIS — M549 Dorsalgia, unspecified: Secondary | ICD-10-CM | POA: Diagnosis not present

## 2023-07-09 DIAGNOSIS — G8929 Other chronic pain: Secondary | ICD-10-CM

## 2023-07-09 DIAGNOSIS — I10 Essential (primary) hypertension: Secondary | ICD-10-CM | POA: Diagnosis not present

## 2023-07-09 DIAGNOSIS — J9612 Chronic respiratory failure with hypercapnia: Secondary | ICD-10-CM

## 2023-07-09 DIAGNOSIS — M1A9XX0 Chronic gout, unspecified, without tophus (tophi): Secondary | ICD-10-CM | POA: Diagnosis not present

## 2023-07-09 LAB — HM MAMMOGRAPHY

## 2023-07-09 MED ORDER — ALLOPURINOL 100 MG PO TABS: 100.0000 mg | ORAL_TABLET | Freq: Every day | ORAL | 3 refills | Status: AC

## 2023-07-09 MED ORDER — TIZANIDINE HCL 2 MG PO TABS: 2.0000 mg | ORAL_TABLET | Freq: Four times a day (QID) | ORAL | 1 refills | Status: AC | PRN

## 2023-07-09 MED ORDER — ALBUTEROL SULFATE HFA 108 (90 BASE) MCG/ACT IN AERS: 2.0000 | INHALATION_SPRAY | Freq: Four times a day (QID) | RESPIRATORY_TRACT | 2 refills | Status: DC | PRN

## 2023-07-09 MED ORDER — GABAPENTIN 300 MG PO CAPS: 300.0000 mg | ORAL_CAPSULE | Freq: Three times a day (TID) | ORAL | 5 refills | Status: DC

## 2023-07-09 MED ORDER — ONDANSETRON HCL 4 MG PO TABS: 4.0000 mg | ORAL_TABLET | Freq: Three times a day (TID) | ORAL | 0 refills | Status: AC | PRN

## 2023-07-09 NOTE — Progress Notes (Unsigned)
   Subjective:   Patient ID: Donna Booker, female    DOB: [birth year], 78 y.o.   MRN: [id number]  HPI The patient is a 78 YO female coming in for medical management.   Review of Systems  Constitutional: Negative.   HENT: Negative.    Eyes: Negative.   Respiratory:  Negative for cough, chest tightness and shortness of breath.   Cardiovascular:  Negative for chest pain, palpitations and leg swelling.  Gastrointestinal:  Negative for abdominal distention, abdominal pain, constipation, diarrhea, nausea and vomiting.  Musculoskeletal: Negative.   Skin: Negative.   Neurological: Negative.   Psychiatric/Behavioral: Negative.      Objective:  Physical Exam Constitutional:      Appearance: She is well-developed.  HENT:     Head: Normocephalic and atraumatic.  Cardiovascular:     Rate and Rhythm: Normal rate and regular rhythm.  Pulmonary:     Effort: Pulmonary effort is normal. No respiratory distress.     Breath sounds: Normal breath sounds. No wheezing or rales.  Abdominal:     General: Bowel sounds are normal. There is no distension.     Palpations: Abdomen is soft.     Tenderness: There is no abdominal tenderness. There is no rebound.  Musculoskeletal:     Cervical back: Normal range of motion.  Skin:    General: Skin is warm and dry.  Neurological:     Mental Status: She is alert and oriented to person, place, and time.     Coordination: Coordination normal.     Vitals:   07/09/23 1613  BP: 138/80  Pulse: 71  Temp: 98.1 F (36.7 C)  TempSrc: Oral  SpO2: 98%  Weight: 190 lb (86.2 kg)  Height: 5' 4 (1.626 m)    Assessment & Plan:

## 2023-07-12 ENCOUNTER — Encounter: Payer: Self-pay | Admitting: Internal Medicine

## 2023-07-12 NOTE — Assessment & Plan Note (Signed)
 BP at goal on lasix  and amlodipine  and imdur  and metoprolol . Continue and monitoring BMP intermittently.

## 2023-07-12 NOTE — Assessment & Plan Note (Signed)
 Refilled gabapentin  and tizanidine  which she uses for her chronic pain in back with good relief. Denies side effects today.

## 2023-07-12 NOTE — Assessment & Plan Note (Signed)
 Refilled albuterol  which she uses as needed.

## 2023-07-12 NOTE — Assessment & Plan Note (Signed)
 Encouraged to get uric acid labs already in place to check levels with dose adjustment to 400 mg allopurinol  daily made after last visit.

## 2023-07-23 ENCOUNTER — Telehealth: Payer: Self-pay | Admitting: Cardiology

## 2023-07-23 MED ORDER — ROSUVASTATIN CALCIUM 20 MG PO TABS: 20.0000 mg | ORAL_TABLET | Freq: Every day | ORAL | 2 refills | Status: DC

## 2023-07-23 MED ORDER — METOPROLOL SUCCINATE ER 100 MG PO TB24: 100.0000 mg | ORAL_TABLET | Freq: Every day | ORAL | 2 refills | Status: AC

## 2023-07-23 NOTE — Telephone Encounter (Signed)
 Note below indicates refill needed for Rosuvastatin  10 mg.  Our records indicate 20 mg. Patient confirms she is taking 20 mg.  Refill for 20 mg Rosuvastatin  sent to pharmacy.

## 2023-07-23 NOTE — Telephone Encounter (Signed)
*  STAT* If patient is at the pharmacy, call can be transferred to refill team.   1. Which medications need to be refilled? (please list name of each medication and dose if known)   rosuvastatin  (CRESTOR ) 10 MG tablet  metoprolol  succinate (TOPROL -XL) 100 MG 24 hr tablet    2. Would you like to learn more about the convenience, safety, & potential cost savings by using the [facility]?    3. Are you open to using the [facility] (Type [facility]. ).   4. Which pharmacy/location (including street and city if local pharmacy) is medication to be sent to? [facility] - [place], [place] - [address]    5. Do they need a 30 day or 90 day supply? 90 day

## 2023-07-23 NOTE — Telephone Encounter (Signed)
 Prescriptions sent to pharmacy. One year follow up appointment made with Dr Elmira for October 29

## 2023-09-09 ENCOUNTER — Other Ambulatory Visit: Payer: Self-pay | Admitting: Cardiology

## 2023-09-09 DIAGNOSIS — R600 Localized edema: Secondary | ICD-10-CM

## 2023-09-11 ENCOUNTER — Ambulatory Visit: Admitting: Internal Medicine

## 2023-09-11 ENCOUNTER — Encounter: Payer: Self-pay | Admitting: Internal Medicine

## 2023-09-11 VITALS — BP 120/70 | HR 71 | Ht 64.0 in | Wt 194.8 lb

## 2023-09-11 DIAGNOSIS — E0500 Thyrotoxicosis with diffuse goiter without thyrotoxic crisis or storm: Secondary | ICD-10-CM | POA: Diagnosis not present

## 2023-09-11 DIAGNOSIS — E785 Hyperlipidemia, unspecified: Secondary | ICD-10-CM

## 2023-09-11 DIAGNOSIS — E1142 Type 2 diabetes mellitus with diabetic polyneuropathy: Secondary | ICD-10-CM

## 2023-09-11 DIAGNOSIS — E1169 Type 2 diabetes mellitus with other specified complication: Secondary | ICD-10-CM | POA: Diagnosis not present

## 2023-09-11 DIAGNOSIS — Z794 Long term (current) use of insulin: Secondary | ICD-10-CM

## 2023-09-11 LAB — POCT GLYCOSYLATED HEMOGLOBIN (HGB A1C): Hemoglobin A1C: 6.6 % — AB (ref 4.0–5.6)

## 2023-09-11 LAB — HM DIABETES EYE EXAM

## 2023-09-11 NOTE — Patient Instructions (Addendum)
 Please continue: - Trulicity  3 mg weekly - Glipizide  5 mg before a large dinner - Lantus  20-25 units at night  Try to check blood sugars once a day, rotating check times.   Also, continue: - Methimazole  2.5 mg daily   Please return in 4 months with your sugar log.

## 2023-09-11 NOTE — Progress Notes (Signed)
 Patient ID: [name], female   DOB: [birth year], 78 y.o.   MRN: [id number]  HPI: Donna Booker is a 78 y.o.-year-old female, presenting for f/u for DM2, dx in 1987, insulin -dependent since 2006, uncontrolled, with complications (PN, stage 2-3 CKD)and Graves ds. Last visit 3 months ago. She drives from [place].  Interim history: No increased urination, blurry vision, nausea, chest pain.  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 Now on hospice. She is not getting much rest at night.  Graves ds  -Diagnosed during an admission for A. fib with RVR in 05/2017.  Reviewed her TFTs: Lab Results  Component Value Date   TSH 1.59 06/07/2023   TSH 2.16 12/19/2022   TSH 1.15 05/08/2022   TSH 2.40 04/25/2021   TSH 2.90 10/15/2020   TSH 1.37 07/14/2020   TSH 3.94 06/28/2020   TSH 4.90 (H) 04/26/2020   TSH 5.06 (H) 02/26/2020   TSH 3.03 09/19/2019  08/30/2022: TSH 2.12  Lab Results  Component Value Date   FREET4 0.9 06/07/2023   FREET4 0.69 12/19/2022   FREET4 0.77 05/08/2022   FREET4 0.67 04/25/2021   FREET4 0.81 10/15/2020   FREET4 1.30 07/14/2020   FREET4 0.79 06/28/2020   FREET4 0.71 04/26/2020   FREET4 0.67 02/26/2020   FREET4 0.61 09/19/2019   Lab Results  Component Value Date   T3FREE 2.9 06/07/2023   T3FREE 2.9 05/08/2022   T3FREE 3.3 04/25/2021   T3FREE 3.2 10/15/2020   T3FREE 3.5 07/14/2020   T3FREE 3.4 06/28/2020   T3FREE 3.0 04/26/2020   T3FREE 3.0 02/26/2020   T3FREE 3.1 09/19/2019   T3FREE 3.2 06/05/2019   Graves' antibodies were elevated Lab Results  Component Value Date   TSI 8.02 (H) 04/16/2017   She was started on methimazole  10 mg 2x a day and atenolol  50 mg 3x a day.Her TFTs were significantly improved >> we decreased the methimazole  to 5 mg 2X a day, but then on 5 mg daily (? When changed - by PCP? -Patient cannot remember exactly when she started her prescription for methimazole  only mentioned to take it once a  day...).   12/2017: We stopped methimazole  as the TSH was 10.   02/2019: Patient finally return for labs: TSH was suppressed completely 03/2019: We restarted methimazole  5 mg twice a day  03/2020: We decreased the methimazole  dose to 5 mg daily 04/2020: I advised her to decrease the methimazole  dose to 2.5 mg daily >> SHE FORGOT, so she continued on 5 mg daily 06/2020: Decreased MMI to 2.5 mg daily.  She continues Toprol -XL.  Pt denies: - feeling nodules in neck - hoarseness - dysphagia - choking  No FH of thyroid  disease or thyroid  cancer. No h/o radiation tx to head or neck. No herbal supplements. No Biotin use. No recent steroids use.   DM2: Reviewed HbA1c levels: Lab Results  Component Value Date   HGBA1C 6.0 (A) 05/14/2023   HGBA1C 5.8 (A) 01/15/2023   HGBA1C 6.2 (A) 09/11/2022   HGBA1C 6.2 (A) 05/08/2022   HGBA1C 6.7 (A) 12/30/2021   HGBA1C 9.0 (A) 08/30/2021   HGBA1C 7.4 (A) 04/25/2021   HGBA1C 9.5 (A) 10/15/2020   HGBA1C 6.1 (A) 06/28/2020   HGBA1C 6.8 (H) 02/26/2020   HGBA1C 8.1 (A) 11/25/2019   HGBA1C 8.6 (A) 09/19/2019   HGBA1C 7.3 (A) 06/05/2019   HGBA1C 8.8 (H) 02/26/2019   HGBA1C 13.0 04/26/2018   HGBA1C 14.1 (H) 03/19/2018   HGBA1C  13.1 (A) 12/28/2017   HGBA1C 9.8 (H) 07/14/2017   HGBA1C 8.0 02/01/2016   HGBA1C 8.3 (H) 10/20/2015  08/30/2022: HbA1c 6.0% 10/15/2020:  HbA1c calculated from fructosamine is much better, at 7.45%, correlating better with her blood sugars at home.   She is on: - Glipizide  10 mg 2x a day before meals >> ... 5 mg before a larger dinner - Basaglar  30 >> 25 >> 20 >> 15 (forgot) >> 20 >> 16 units at night >> Lantus  20-25 units daily - Trulicity  1.5 mg weekly - started 12/2018 >> 3 mg weekly  She was previously on a VGo patch pump.   She also tried Bydureon (02/2013-10/2014) >> made her hungry, did not lower her sugars. She was previously on Levemir  (initially 50 units daily) and Lantus .  She is not checking blood sugars....  From last OV: - am:  61-116, 127 >> 70, 75-101, 110, 115 >> 85-100 - 2h after b'fast:   n/c >> 127, 130 >> n/c >> 85-107 >> 86-131 - before lunch: 137, 140 >> 150-200 >> n/c >> 88-99 >> 115, 128 - 2h after lunch: 340 >> n/c >> 123 >> 86-110, 135 >> 119-130 - before dinner: 108, 191 >> n/c >> 110 >> 125-140 >> n/c - 2h after dinner: 205 >> n/c >> 190 >> 88-126 >> 130-175 - bedtime:130-137 >> ? >> 207 >> n/c >> 119 >> n/c - nighttime: n/c Lowest sugar was 48 ... >> 51 >> 85 >> ?; she has hypoglycemia awareness in the 70s. Highest sugar was  270 (off Trulicity ) >> ... 135 >> 175 >> ?Donna Booker  Glucometer: AccuChek  -+ CKD - seeing nephrology, last BUN/creatinine:  Lab Results  Component Value Date   BUN 26 (H) 12/19/2022   CREATININE 1.38 (H) 12/19/2022  08/30/2022: 20/1.3, GFR 43, glucose 51   No results found for: MICRALBCREAT Previously on olmesartan , now off (?)  -+ HL; last set of lipids: Lab Results  Component Value Date   CHOL 126 12/19/2022   HDL 41.10 12/19/2022   LDLCALC 65 12/19/2022   LDLDIRECT 98.0 03/19/2018   TRIG 101.0 12/19/2022   CHOLHDL 3 12/19/2022  On Crestor  20.  - last eye exam was in 2023: No DR reportedly. Has an appt today.  - no numbness and tingling in her feet.  Latest foot exam was 2022/10/11.  Her son died in 2017-07-18-he had severe heart failure.  ROS: + See HPI  I reviewed pt's medications, allergies, PMH, social hx, family hx, and changes were documented in the history of present illness. Otherwise, unchanged from my initial visit note.  Past Medical History:  Diagnosis Date   Afib (HCC)    Diabetes mellitus    Graves disease    Heart failure (HCC)    Hypertension    Hyperthyroidism    Past Surgical History:  Procedure Laterality Date   BREAST SURGERY     Social History   Social History   Marital Status: Married    Spouse Name: N/A   Number of Children: 1   Occupational History   Customer svc rep   Social History Main Topics    Smoking status: Never Smoker    Smokeless tobacco: Not on file   Alcohol Use: No   Drug Use: No   Current Outpatient Medications on File Prior to Visit  Medication Sig Dispense Refill   Abatacept  (ORENCIA  [place]) Inject into the skin every 30 (thirty) days.     albuterol  (VENTOLIN  HFA) 108 (90  Base) MCG/ACT inhaler Inhale 2 puffs into the lungs every 6 (six) hours as needed for wheezing or shortness of breath. 8 g 2   allopurinol  (ZYLOPRIM ) 100 MG tablet Take 1 tablet (100 mg total) by mouth daily. Take with 300 for total dose 400 mg 90 tablet 3   allopurinol  (ZYLOPRIM ) 300 MG tablet TAKE 1 TABLET BY MOUTH EVERY DAY 90 tablet 1   amLODipine  (NORVASC ) 10 MG tablet Take 1 tablet (10 mg total) by mouth daily. 90 tablet 1   apixaban  (ELIQUIS ) 2.5 MG TABS tablet Take 2 tablets (5 mg total) by mouth 2 (two) times daily. (Patient not taking: Reported on 07/09/2023) 28 tablet 0   apixaban  (ELIQUIS ) 5 MG TABS tablet Take 1 tablet (5 mg total) by mouth 2 (two) times daily. 180 tablet 3   docusate sodium  (COLACE) 100 MG capsule Take 1 capsule (100 mg total) by mouth 2 (two) times daily. (Patient not taking: Reported on 07/09/2023) 180 capsule 3   Dulaglutide  (TRULICITY ) 3 MG/0.5ML SOAJ Inject 3 mg into the skin once a week. 6 mL 3   esomeprazole (NEXIUM) 40 MG capsule Take 40 mg by mouth daily before breakfast.     furosemide  (LASIX ) 20 MG tablet Take 2 tablets by mouth twice daily 360 tablet 0   gabapentin  (NEURONTIN ) 300 MG capsule Take 1-2 capsules (300-600 mg total) by mouth 3 (three) times daily. 150 capsule 5   glipiZIDE  (GLUCOTROL ) 5 MG tablet Take 1 tablet by mouth before larger dinners     hydrALAZINE  (APRESOLINE ) 25 MG tablet Take 1 tablet (25 mg total) by mouth 3 (three) times daily. (Patient not taking: Reported on 07/09/2023) 270 tablet 2   Insulin  Glargine (BASAGLAR  KWIKPEN) 100 UNIT/ML Inject 16 Units into the skin daily. 30 mL 3   isosorbide  mononitrate (IMDUR ) 30 MG 24 hr tablet Take 1 tablet by  mouth once daily 90 tablet 2   methimazole  (TAPAZOLE ) 5 MG tablet Take 0.5 tablets (2.5 mg total) by mouth daily. 45 tablet 3   metoprolol  succinate (TOPROL -XL) 100 MG 24 hr tablet Take 1 tablet (100 mg total) by mouth daily. Take with or immediately following a meal. 90 tablet 2   ondansetron  (ZOFRAN ) 4 MG tablet Take 1 tablet (4 mg total) by mouth every 8 (eight) hours as needed for nausea or vomiting. 20 tablet 0   rosuvastatin  (CRESTOR ) 20 MG tablet Take 1 tablet (20 mg total) by mouth daily. 90 tablet 2   tiZANidine  (ZANAFLEX ) 2 MG tablet Take 1 tablet (2 mg total) by mouth every 6 (six) hours as needed for muscle spasms. 60 tablet 1   VOLTAREN  1 % GEL Apply 2 g topically 4 (four) times daily. (Patient taking differently: Apply 2 g topically as needed.) 100 g 3   No current facility-administered medications on file prior to visit.   Allergies  Allergen Reactions   Ibuprofen    Tramadol  Nausea And Vomiting    FH - see HPI  PE: BP 120/70   Pulse 71   Ht 5' 4 (1.626 m)   Wt 194 lb 12.8 oz (88.4 kg)   SpO2 95%   BMI 33.44 kg/m  Wt Readings from Last 10 Encounters:  09/11/23 194 lb 12.8 oz (88.4 kg)  07/09/23 190 lb (86.2 kg)  05/14/23 188 lb 9.6 oz (85.5 kg)  01/15/23 185 lb 9.6 oz (84.2 kg)  12/19/22 184 lb (83.5 kg)  10/23/22 193 lb 12.8 oz (87.9 kg)  09/11/22 194 lb 6.4 oz (88.2  kg)  05/08/22 193 lb 12.8 oz (87.9 kg)  04/24/22 197 lb (89.4 kg)  04/20/22 191 lb (86.6 kg)   Constitutional: overweight, in NAD Eyes:  EOMI, no exophthalmos ENT: no neck masses, no cervical lymphadenopathy Cardiovascular: RRR, No MRG Respiratory: CTA B Musculoskeletal: no deformities Skin:no rashes Neurological: no tremor with outstretched hands Diabetic Foot Exam - Simple   Simple Foot Form Diabetic Foot exam was performed with the following findings: Yes 09/11/2023 11:31 AM  Visual Inspection No deformities, no ulcerations, no other skin breakdown bilaterally: Yes Sensation  Testing Intact to touch and monofilament testing bilaterally: Yes Pulse Check Posterior Tibialis and Dorsalis pulse intact bilaterally: Yes Comments    ASSESSMENT: 1. 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  Graves' disease  3.  HL  We previously also addressed her hyperkalemia: - in 07/2017, she had a critical value of potassium of 7.5.  She was admitted with high potassium since then >> Lasix , potassium supplementation, and lisinopril  were stopped. - A cosyntropin  stimulation test performed in the hospital was normal, ruling out primary adrenal insufficiency as a cause for hyperkalemia - Aldosterone and renin levels were normal during her last hospitalization - On her medication list, I do not see any possible culprits,  other than possibly propranolol  - I suggested a referral to nephrology  if her potassium continues to remain elevated after stopping ACE inhibitor and potassium supplementation -The potassium normalized afterwards  PLAN:  1. Patient with longstanding, previously uncontrolled type 2 diabetes, with improvement in control adding a GLP-1 receptor agonist.  She continues on insulin , which we reduced at last visit, and also glipizide  as needed before a larger dinner.  At last visit, HbA1c was excellent, at 6.0%.  Sugars were at goal.  Since most of the blood sugars in the morning are between 80s and 90s, it started to reduce Basaglar  dose a little more.  I refilled her Trulicity  and insulin . -At today's visit, she mentions that she is still taking 20 units of basal insulin  and occasionally increasing to 25 units.  Unfortunately, we do not have any blood sugars as she is overwhelmed by being a caregiver for her husband, she is not resting well, and she does not have much time for herself.  Based on the HbA1c, sugars are higher, but this is still at goal so for now I did not recommend a change in regimen.  Recommended to make sure that  she takes her glipizide  before larger meals, especially with the holidays coming up.  I did recommend to try to check her blood sugars once a day, rotating check times. - I suggested to:  Patient Instructions  Please continue: - Trulicity  3 mg weekly - Glipizide  5 mg before a large dinner - Lantus  20-25 units at night  Try to check blood sugars once a day, rotating check times.   Also, continue: - Methimazole  2.5 mg daily   Please return in 4 months with your sugar log.  - we checked her HbA1c: 6.6% (higher) - advised to check sugars at different times of the day - 1x a day, rotating check times - advised for yearly eye exams >> she is due - appt coming up today - return to clinic in 4 months  2.  Graves' disease -Her hyperthyroidism was diagnosed after she was admitted with A-fib with RVR and acute pulmonary edema in 2019 - Our working diagnosis is Graves' disease, based on elevated TSI's and clinical course -  We started methimazole  initially, however, we then had to stop this completely after TSH increased to 10 and then restarted it in 03/2019 at 5 mg twice a day as the TSH returned suppressed.  Afterwards, we were able to decrease the dose to 2.5 mg daily, which she continues today. - Latest TSH was normal in 06/2023: 1.59 - No hyperthyroid symptoms: Palpitations, tremors, heat intolerance. - Will recheck her TFTs at next visit  3. HL - Latest lipid panel showed fractions at goal: Lab Results  Component Value Date   CHOL 126 12/19/2022   HDL 41.10 12/19/2022   LDLCALC 65 12/19/2022   LDLDIRECT 98.0 03/19/2018   TRIG 101.0 12/19/2022   CHOLHDL 3 12/19/2022  - She continues Crestor  20 mg daily without side effects  Lela Fendt, MD PhD [facility] Endocrinology

## 2023-09-14 ENCOUNTER — Other Ambulatory Visit: Payer: Self-pay | Admitting: Internal Medicine

## 2023-09-18 ENCOUNTER — Other Ambulatory Visit: Payer: Self-pay | Admitting: Internal Medicine

## 2023-10-31 ENCOUNTER — Ambulatory Visit: Attending: Cardiology | Admitting: Cardiology

## 2023-10-31 ENCOUNTER — Other Ambulatory Visit (HOSPITAL_COMMUNITY): Payer: Self-pay

## 2023-10-31 ENCOUNTER — Encounter: Payer: Self-pay | Admitting: Cardiology

## 2023-10-31 VITALS — BP 110/62 | HR 72 | Ht 64.0 in | Wt 193.0 lb

## 2023-10-31 DIAGNOSIS — E782 Mixed hyperlipidemia: Secondary | ICD-10-CM | POA: Diagnosis not present

## 2023-10-31 DIAGNOSIS — R072 Precordial pain: Secondary | ICD-10-CM | POA: Diagnosis present

## 2023-10-31 DIAGNOSIS — I10 Essential (primary) hypertension: Secondary | ICD-10-CM | POA: Insufficient documentation

## 2023-10-31 DIAGNOSIS — I25118 Atherosclerotic heart disease of native coronary artery with other forms of angina pectoris: Secondary | ICD-10-CM | POA: Insufficient documentation

## 2023-10-31 DIAGNOSIS — I480 Paroxysmal atrial fibrillation: Secondary | ICD-10-CM | POA: Insufficient documentation

## 2023-10-31 MED ORDER — NITROGLYCERIN 0.4 MG SL SUBL
0.4000 mg | SUBLINGUAL_TABLET | SUBLINGUAL | 3 refills | Status: AC | PRN
  Filled 2023-10-31: qty 25, 8d supply, fill #0

## 2023-10-31 NOTE — Progress Notes (Signed)
 Cardiology Office Note:  .   Date:  10/31/2023  ID:  Donna Booker, DOB Aug 22, 1945, MRN [id number] PCP: Rollene Almarie LABOR, MD  [facility] Providers Cardiologist:  Newman Lawrence, MD PCP: Rollene Almarie LABOR, MD  Chief Complaint  Patient presents with   Chest Pain      History of Present Illness: .    Donna Booker is a 78 y.o. female with  Graves' disease with hyperthyroidism, paroxysmal atrial fibrillation, mild PH, HFpEF (secondary to Afib), uncontrolled type II diabetes mellitus with diabetic polyneuropathy, CKD stage III.   Patient has had recent episodes of left-sided chest pain usually with emotional stress that comes with caring for her husband with dementia.  Episodes of chest pain last for about 10 minutes and resolve on its own.  She has not had any specific exertional chest pain symptoms.  Last tress test was in 02/2022.  Vitals:   10/31/23 1022  BP: 110/62  Pulse: 72  SpO2: 98%      ROS:  Review of Systems  Cardiovascular:  Negative for chest pain, dyspnea on exertion, leg swelling, palpitations and syncope.     Studies Reviewed: [name]       EKG 10/31/2023: Normal sinus rhythm Left axis deviation Minimal voltage criteria for LVH, may be normal variant ( R in aVL ) When compared with ECG of 01-Nov-2017 12:43, No significant change was found    Labs 12/2022: Chol 126, TG 101, HDL 41, LDL 65 HbA1C 6.6% Hb 11.4 Cr 1.38, eGFR 37 Uric acid 9.1   Labs 03/2022: Chol 133, TG 108, HDL 40, LDL 73   Risk Assessment/Calculations:    CHA2DS2-VASc Score = 5 . This indicates a 7.2 % annual risk of stroke.   Physical Exam:   Physical Exam Vitals and nursing note reviewed.  Constitutional:      General: She is not in acute distress. Neck:     Vascular: No JVD.  Cardiovascular:     Rate and Rhythm: Normal rate and regular rhythm.     Heart sounds: Normal heart sounds. No murmur heard. Pulmonary:     Effort: Pulmonary effort is  normal.     Breath sounds: Normal breath sounds. No wheezing or rales.  Musculoskeletal:     Right lower leg: No edema.     Left lower leg: No edema.      VISIT DIAGNOSES:   ICD-10-CM   1. Essential (primary) hypertension  I10 EKG 12-Lead    2. Coronary artery disease of native artery of native heart with stable angina pectoris  I25.118 EKG 12-Lead    NM PET CT CARDIAC PERFUSION MULTI W/ABSOLUTE BLOODFLOW    CBC    Basic metabolic panel with GFR    Lipid panel    3. Paroxysmal atrial fibrillation (HCC)  I48.0 EKG 12-Lead    4. Mixed hyperlipidemia  E78.2 EKG 12-Lead    5. Precordial pain  R07.2 NM PET CT CARDIAC PERFUSION MULTI W/ABSOLUTE BLOODFLOW       ASSESSMENT AND PLAN: .    Donna Booker is a 78 y.o. female with Graves' disease with hyperthyroidism, paroxysmal atrial fibrillation, mild PH, HFpEF (secondary to Afib), uncontrolled type II diabetes mellitus with diabetic polyneuropathy, CKD stage III.  Precordial pain: Known coronary calcification, nuclear stress test without ischemia (02/2022). Recent episodes of chest pain with emotional stress are concerning for angina. She is already on optimal medical therapy. Recommend SPECT/CT stress test. Not on aspirin  due to ongoing use of  Eliquis . Crestor  to 20 mg daily.  PAF: Maintaining sinus rhythm. CHA2DS2VASc score 5, annual stroke risk 7.2%. Continue anticaogualtion with eliquis  5 mg bid.   Hypertension: Well-controlled.   Grave's disease, DM:  F/u w/endocrinology  Informed Consent   Shared Decision Making/Informed Consent The risks [chest pain, shortness of breath, cardiac arrhythmias, dizziness, blood pressure fluctuations, myocardial infarction, stroke/transient ischemic attack, nausea, vomiting, allergic reaction, radiation exposure, metallic taste sensation and life-threatening complications (estimated to be 1 in 10,000)], benefits (risk stratification, diagnosing coronary artery disease, treatment  guidance) and alternatives of a nuclear stress test were discussed in detail with Ms. Amparo and she agrees to proceed.      Meds ordered this encounter  Medications   nitroGLYCERIN  (NITROSTAT ) 0.4 MG SL tablet    Sig: Place 1 tablet (0.4 mg total) under the tongue every 5 (five) minutes as needed.    Dispense:  25 tablet    Refill:  3     F/u in 1 year  Signed, Newman JINNY Lawrence, MD

## 2023-10-31 NOTE — Patient Instructions (Addendum)
 Medication Instructions:  START AS NEEDED FOR CHEST PAIN: Nitroglycerin    *If you need a refill on your cardiac medications before your next appointment, please call your pharmacy*  Lab Work: CBC BMP LIPID PANEL  If you have labs (blood work) drawn today and your tests are completely normal, you will receive your results only by: MyChart Message (if you have MyChart) OR A paper copy in the mail If you have any lab test that is abnormal or we need to change your treatment, we will call you to review the results.  Testing/Procedures: PET/CT STRESS TEST   How to Prepare for Your Cardiac PET/CT Stress Test:  1. Please do not take these medications before your test:  ~Medications that may interfere with the cardiac pharmacological stress agent (ex. nitrates - including erectile dysfunction medications, isosorbide  mononitrate, tamulosin or beta-blockers-Metoprolol ) the day of the exam. (Erectile dysfunction medication should be held for at least 72 hrs prior to test) ~Theophylline containing medications for 12 hours. ~Dipyridamole 48 hours prior to the test. ~Your remaining medications may be taken with water.  2. Nothing to eat or drink, except water, 3 hours prior to arrival time.   ~ NO caffeine /decaffeinated products, or chocolate 12 hours prior to arrival.  3. NO perfume, cologne or lotion on chest or abdomen area.         - FEMALES - Please avoid wearing dresses to this appointment.  4. Total time is 1 to 2 hours; you may want to bring reading material for the waiting time.  Please report to Radiology at the [facility] Main Entrance 30 minutes early for your test. [address] [place], [place] [address]  Diabetic Preparation: - Hold oral medications. - You may take NPH and Lantus  insulin . - Do not take Humalog or Humulin R  (Regular Insulin ) the day of your test. - Check blood sugars prior to leaving the house. - If able to eat breakfast prior to 3 hour  fasting, you may take all medications, including your insulin , - Do not worry if you miss your breakfast dose of insulin  - start at your next meal. - Patients who wear a continuous glucose monitor MUST remove the device prior to scanning.  IF YOU THINK YOU MAY BE PREGNANT, OR ARE NURSING PLEASE INFORM THE TECHNOLOGIST.  In preparation for your appointment, medication and supplies will be purchased.  Appointment availability is limited, so if you need to cancel or reschedule, please call the Radiology Department at [phone] [facility]) OR [phone] [facility])  24 hours in advance to avoid a cancellation fee of $100.00  What to Expect After you Arrive:  Once you arrive and check in for your appointment, you will be taken to a preparation room within the Radiology Department.  A technologist or Nurse will obtain your medical history, verify that you are correctly prepped for the exam, and explain the procedure.  Afterwards,  an IV will be started in your arm and electrodes will be placed on your skin for EKG monitoring during the stress portion of the exam. Then you will be escorted to the PET/CT scanner.  There, staff will get you positioned on the scanner and obtain a blood pressure and EKG.  During the exam, you will continue to be connected to the EKG and blood pressure machines.  A small, safe amount of a radioactive tracer will be injected in your IV to obtain a series of pictures of your heart along with an injection of a stress agent.  After your Exam:  It is recommended that you eat a meal and drink a caffeinated beverage to counter act any effects of the stress agent.  Drink plenty of fluids for the remainder of the day and urinate frequently for the first couple of hours after the exam.  Your doctor will inform you of your test results within 7-10 business days.  For more information and frequently asked questions, please visit our website : [email or web address]  For  questions about your test or how to prepare for your test, please call: Cardiac Imaging Nurse Navigators Office: [phone]    Follow-Up: At [facility], you and your health needs are our priority.  As part of our continuing mission to provide you with exceptional heart care, our providers are all part of one team.  This team includes your primary Cardiologist (physician) and Advanced Practice Providers or APPs (Physician Assistants and Nurse Practitioners) who all work together to provide you with the care you need, when you need it.  Your next appointment:   3 month(s)  Provider:   Newman JINNY Lawrence, MD

## 2023-11-01 ENCOUNTER — Ambulatory Visit: Payer: Self-pay | Admitting: Cardiology

## 2023-11-01 LAB — BASIC METABOLIC PANEL WITH GFR
BUN/Creatinine Ratio: 15 (ref 12–28)
BUN: 20 mg/dL (ref 8–27)
CO2: 24 mmol/L (ref 20–29)
Calcium: 9.5 mg/dL (ref 8.7–10.3)
Chloride: 102 mmol/L (ref 96–106)
Creatinine, Ser: 1.36 mg/dL — ABNORMAL HIGH (ref 0.57–1.00)
Glucose: 103 mg/dL — ABNORMAL HIGH (ref 70–99)
Potassium: 4.3 mmol/L (ref 3.5–5.2)
Sodium: 143 mmol/L (ref 134–144)
eGFR: 40 mL/min/1.73 — ABNORMAL LOW (ref >59)

## 2023-11-01 LAB — LIPID PANEL
Chol/HDL Ratio: 2.9 ratio (ref 0.0–4.4)
Cholesterol, Total: 129 mg/dL (ref 100–199)
HDL: 44 mg/dL (ref >39)
LDL Chol Calc (NIH): 64 mg/dL (ref 0–99)
Triglycerides: 113 mg/dL (ref 0–149)
VLDL Cholesterol Cal: 21 mg/dL (ref 5–40)

## 2023-11-01 LAB — CBC
Hematocrit: 36.6 % (ref 34.0–46.6)
Hemoglobin: 11.4 g/dL (ref 11.1–15.9)
MCH: 23.9 pg — ABNORMAL LOW (ref 26.6–33.0)
MCHC: 31.1 g/dL — ABNORMAL LOW (ref 31.5–35.7)
MCV: 77 fL — ABNORMAL LOW (ref 79–97)
Platelets: 360 x10E3/uL (ref 150–450)
RBC: 4.77 x10E6/uL (ref 3.77–5.28)
RDW: 17 % — ABNORMAL HIGH (ref 11.7–15.4)
WBC: 11.5 x10E3/uL — ABNORMAL HIGH (ref 3.4–10.8)

## 2023-11-01 NOTE — Progress Notes (Signed)
 Labs look good.  Thanks MJP

## 2023-11-02 ENCOUNTER — Other Ambulatory Visit: Payer: Self-pay | Admitting: Cardiology

## 2023-11-02 DIAGNOSIS — R0609 Other forms of dyspnea: Secondary | ICD-10-CM

## 2023-11-13 ENCOUNTER — Other Ambulatory Visit: Payer: Self-pay | Admitting: Cardiology

## 2023-11-13 DIAGNOSIS — I10 Essential (primary) hypertension: Secondary | ICD-10-CM

## 2023-11-20 ENCOUNTER — Telehealth (HOSPITAL_COMMUNITY): Payer: Self-pay | Admitting: *Deleted

## 2023-11-20 NOTE — Telephone Encounter (Signed)
 Reaching out to patient to offer assistance regarding upcoming cardiac imaging study; pt verbalizes understanding of appt date/time, parking situation and where to check in, pre-test NPO status and medications ordered, and verified current allergies; name and call back number provided for further questions should they arise Johney Frame RN Navigator Cardiac Imaging [facility] [phone] office [phone] cell  Patient aware to avoid caffeine for 12 hours prior and hold imdur.

## 2023-11-21 ENCOUNTER — Other Ambulatory Visit (HOSPITAL_COMMUNITY): Payer: Self-pay | Admitting: Cardiology

## 2023-11-21 ENCOUNTER — Ambulatory Visit (HOSPITAL_COMMUNITY)
Admission: RE | Admit: 2023-11-21 | Discharge: 2023-11-21 | Disposition: A | Discharge status: Home / Self Care | Source: Ambulatory Visit | Attending: Cardiology | Admitting: Cardiology

## 2023-11-21 DIAGNOSIS — I25118 Atherosclerotic heart disease of native coronary artery with other forms of angina pectoris: Secondary | ICD-10-CM | POA: Insufficient documentation

## 2023-11-21 DIAGNOSIS — R072 Precordial pain: Secondary | ICD-10-CM | POA: Diagnosis present

## 2023-11-21 LAB — NM PET CT CARDIAC PERFUSION MULTI W/ABSOLUTE BLOODFLOW
MBFR: 2.37
Nuc Rest EF: 69 %
Nuc Stress EF: 70 %
Rest MBF: 0.91 ml/g/min
Rest Nuclear Isotope Dose: 22.7 mCi
ST Depression (mm): 0 mm
Stress MBF: 2.16 ml/g/min
Stress Nuclear Isotope Dose: 22.7 mCi
TID: 1.08

## 2023-11-21 MED ORDER — REGADENOSON 0.4 MG/5ML IV SOLN
0.4000 mg | Freq: Once | INTRAVENOUS | Status: AC
  Administered 2023-11-21: 0.4 mg via INTRAVENOUS

## 2023-11-21 MED ORDER — RUBIDIUM RB82 GENERATOR (RUBYFILL)
22.6700 | PACK | Freq: Once | INTRAVENOUS | Status: AC
  Administered 2023-11-21: 22.67 via INTRAVENOUS

## 2023-11-21 MED ORDER — RUBIDIUM RB82 GENERATOR (RUBYFILL)
22.6500 | PACK | Freq: Once | INTRAVENOUS | Status: AC
  Administered 2023-11-21: 22.65 via INTRAVENOUS

## 2023-11-21 MED ORDER — REGADENOSON 0.4 MG/5ML IV SOLN
INTRAVENOUS | Status: AC
  Filled 2023-11-21: qty 5

## 2023-11-22 NOTE — Progress Notes (Signed)
 Severe coronary calcification, but no significant heart muscle circulation abnormalities noted on stress test. Cholesterol continues to improve. LDL is 64. We can bring it even lower by increasing Crestor  to 40 mg daily, if that's okay with you.  Thanks MJP

## 2023-11-23 MED ORDER — ROSUVASTATIN CALCIUM 40 MG PO TABS: 40.0000 mg | ORAL_TABLET | Freq: Every day | ORAL | 3 refills | Status: AC

## 2023-11-23 NOTE — Progress Notes (Signed)
 The patient has been notified of the result and verbalized understanding. All questions (if any) were answered.   Send patient crestor  40 mg to pharmacy.

## 2023-12-08 ENCOUNTER — Other Ambulatory Visit: Payer: Self-pay | Admitting: Cardiology

## 2023-12-08 DIAGNOSIS — R600 Localized edema: Secondary | ICD-10-CM

## 2023-12-20 ENCOUNTER — Ambulatory Visit

## 2024-01-09 ENCOUNTER — Other Ambulatory Visit: Payer: Self-pay

## 2024-01-09 DIAGNOSIS — I480 Paroxysmal atrial fibrillation: Secondary | ICD-10-CM

## 2024-01-09 MED ORDER — APIXABAN 5 MG PO TABS: 5.0000 mg | ORAL_TABLET | Freq: Two times a day (BID) | ORAL | 2 refills | Status: AC

## 2024-01-09 NOTE — Telephone Encounter (Signed)
 Eliquis  5mg  refill request received. Patient is 79 years old, weight-87.5kg, Crea-1.36 on 10/31/23, Diagnosis-Afib, and last seen by Dr. Elmira on 10/31/23. Dose is appropriate based on dosing criteria. Will send in refill to requested pharmacy.

## 2024-01-13 ENCOUNTER — Other Ambulatory Visit: Payer: Self-pay | Admitting: Internal Medicine

## 2024-01-15 ENCOUNTER — Encounter: Payer: Self-pay | Admitting: Internal Medicine

## 2024-01-15 ENCOUNTER — Ambulatory Visit (INDEPENDENT_AMBULATORY_CARE_PROVIDER_SITE_OTHER): Admitting: Internal Medicine

## 2024-01-15 ENCOUNTER — Other Ambulatory Visit

## 2024-01-15 VITALS — BP 122/80 | HR 80 | Ht 64.0 in | Wt 189.0 lb

## 2024-01-15 DIAGNOSIS — E785 Hyperlipidemia, unspecified: Secondary | ICD-10-CM | POA: Diagnosis not present

## 2024-01-15 DIAGNOSIS — Z794 Long term (current) use of insulin: Secondary | ICD-10-CM | POA: Diagnosis not present

## 2024-01-15 DIAGNOSIS — E0500 Thyrotoxicosis with diffuse goiter without thyrotoxic crisis or storm: Secondary | ICD-10-CM

## 2024-01-15 DIAGNOSIS — E1142 Type 2 diabetes mellitus with diabetic polyneuropathy: Secondary | ICD-10-CM | POA: Diagnosis not present

## 2024-01-15 DIAGNOSIS — E1169 Type 2 diabetes mellitus with other specified complication: Secondary | ICD-10-CM

## 2024-01-15 DIAGNOSIS — E1165 Type 2 diabetes mellitus with hyperglycemia: Secondary | ICD-10-CM | POA: Diagnosis not present

## 2024-01-15 LAB — POCT GLYCOSYLATED HEMOGLOBIN (HGB A1C): Hemoglobin A1C: 6.7 % — AB (ref 4.0–5.6)

## 2024-01-15 MED ORDER — LANTUS SOLOSTAR 100 UNIT/ML ~~LOC~~ SOPN: 20.0000 [IU] | PEN_INJECTOR | Freq: Every day | SUBCUTANEOUS | 3 refills | Status: AC

## 2024-01-15 MED ORDER — GLIPIZIDE 5 MG PO TABS: ORAL_TABLET | ORAL | 3 refills | Status: AC

## 2024-01-15 NOTE — Addendum Note (Signed)
 Addended by: CLEOTILDE ROLIN RAMAN on: 01/15/2024 01:50 PM   Modules accepted: Orders

## 2024-01-15 NOTE — Patient Instructions (Addendum)
 Please continue: - Trulicity  3 mg weekly - Glipizide  5 mg before b'fast and before larger dinners - Lantus  20 units at night  Try to check blood sugars once a day, rotating check times.   Also, continue: - Methimazole  2.5 mg daily   Please return in 4 months.
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
## 2024-01-16 ENCOUNTER — Ambulatory Visit: Payer: Self-pay | Admitting: Internal Medicine

## 2024-01-16 NOTE — Telephone Encounter (Signed)
 Pt has been notified and voices understanding.

## 2024-01-18 LAB — T4, FREE: Free T4: 1.2 ng/dL (ref 0.8–1.8)

## 2024-01-18 LAB — TSH: TSH: 1.6 m[IU]/L (ref 0.40–4.50)

## 2024-01-18 LAB — THYROID STIMULATING IMMUNOGLOBULIN: TSI: <89 %{baseline}

## 2024-01-18 LAB — T3, FREE: T3, Free: 3.1 pg/mL (ref 2.3–4.2)

## 2024-01-18 MED ORDER — METHIMAZOLE 5 MG PO TABS: 2.5000 mg | ORAL_TABLET | Freq: Every day | ORAL | 3 refills | Status: AC

## 2024-01-18 NOTE — Addendum Note (Signed)
 Addended by: TRIXIE FILE on: 01/18/2024 03:17 PM   Modules accepted: Orders

## 2024-01-25 NOTE — Telephone Encounter (Signed)
 Call attempted

## 2024-01-31 ENCOUNTER — Ambulatory Visit: Admitting: Cardiology

## 2024-02-12 ENCOUNTER — Ambulatory Visit

## 2024-03-07 ENCOUNTER — Ambulatory Visit: Admitting: Cardiology

## 2024-05-12 ENCOUNTER — Ambulatory Visit: Admitting: Internal Medicine
